# Patient Record
Sex: Male | Born: 1940
Health system: Southern US, Community
[De-identification: ages and names within clinical notes are randomized; demographics above are authoritative.]

## PROBLEM LIST (undated history)

## (undated) ENCOUNTER — Emergency Department (HOSPITAL_COMMUNITY): Admission: EM | Payer: Medicare HMO | Source: Home / Self Care

## (undated) DIAGNOSIS — N400 Enlarged prostate without lower urinary tract symptoms: Secondary | ICD-10-CM

## (undated) DIAGNOSIS — Z9189 Other specified personal risk factors, not elsewhere classified: Secondary | ICD-10-CM

## (undated) DIAGNOSIS — I10 Essential (primary) hypertension: Secondary | ICD-10-CM

## (undated) DIAGNOSIS — Z87442 Personal history of urinary calculi: Secondary | ICD-10-CM

## (undated) DIAGNOSIS — R7302 Impaired glucose tolerance (oral): Secondary | ICD-10-CM

## (undated) DIAGNOSIS — E785 Hyperlipidemia, unspecified: Secondary | ICD-10-CM

## (undated) DIAGNOSIS — J449 Chronic obstructive pulmonary disease, unspecified: Secondary | ICD-10-CM

## (undated) DIAGNOSIS — R300 Dysuria: Secondary | ICD-10-CM

## (undated) DIAGNOSIS — K5730 Diverticulosis of large intestine without perforation or abscess without bleeding: Secondary | ICD-10-CM

## (undated) DIAGNOSIS — N289 Disorder of kidney and ureter, unspecified: Secondary | ICD-10-CM

## (undated) DIAGNOSIS — N210 Calculus in bladder: Secondary | ICD-10-CM

## (undated) HISTORY — DX: Chronic obstructive pulmonary disease, unspecified: J44.9

## (undated) HISTORY — PX: APPENDECTOMY: SHX54

## (undated) HISTORY — PX: CATARACT EXTRACTION W/ INTRAOCULAR LENS  IMPLANT, BILATERAL: SHX1307

## (undated) HISTORY — DX: Dysuria: R30.0

---

## 1998-01-06 ENCOUNTER — Ambulatory Visit (HOSPITAL_COMMUNITY): Admission: RE | Admit: 1998-01-06 | Discharge: 1998-01-06 | Payer: Self-pay | Admitting: *Deleted

## 1998-01-06 ENCOUNTER — Encounter: Payer: Self-pay | Admitting: *Deleted

## 2003-10-05 HISTORY — PX: COLONOSCOPY: SHX174

## 2003-10-05 LAB — HM COLONOSCOPY

## 2003-10-09 ENCOUNTER — Ambulatory Visit (HOSPITAL_COMMUNITY): Admission: RE | Admit: 2003-10-09 | Discharge: 2003-10-09 | Payer: Self-pay | Admitting: General Surgery

## 2003-10-09 HISTORY — PX: LAPAROSCOPIC INGUINAL HERNIA REPAIR: SUR788

## 2003-11-12 ENCOUNTER — Ambulatory Visit: Payer: Self-pay | Admitting: Internal Medicine

## 2004-03-29 ENCOUNTER — Ambulatory Visit: Payer: Self-pay | Admitting: Internal Medicine

## 2004-04-21 ENCOUNTER — Ambulatory Visit: Payer: Self-pay | Admitting: Internal Medicine

## 2004-09-13 ENCOUNTER — Ambulatory Visit: Payer: Self-pay | Admitting: Internal Medicine

## 2004-10-28 ENCOUNTER — Ambulatory Visit: Payer: Self-pay | Admitting: Internal Medicine

## 2004-11-01 ENCOUNTER — Ambulatory Visit: Payer: Self-pay | Admitting: Internal Medicine

## 2004-12-09 ENCOUNTER — Ambulatory Visit: Payer: Self-pay | Admitting: Internal Medicine

## 2005-02-17 ENCOUNTER — Ambulatory Visit: Payer: Self-pay | Admitting: Internal Medicine

## 2005-09-21 ENCOUNTER — Encounter: Payer: Self-pay | Admitting: Internal Medicine

## 2005-09-21 LAB — CONVERTED CEMR LAB: PSA: 0.55 ng/mL

## 2005-11-03 ENCOUNTER — Ambulatory Visit: Payer: Self-pay | Admitting: Internal Medicine

## 2005-12-15 ENCOUNTER — Ambulatory Visit: Payer: Self-pay | Admitting: Internal Medicine

## 2006-01-09 ENCOUNTER — Ambulatory Visit: Payer: Self-pay | Admitting: Internal Medicine

## 2006-01-09 LAB — CONVERTED CEMR LAB
ALT: 17 units/L (ref 0–40)
AST: 17 units/L (ref 0–37)
Albumin: 4 g/dL (ref 3.5–5.2)
Alkaline Phosphatase: 51 units/L (ref 39–117)
BUN: 26 mg/dL — ABNORMAL HIGH (ref 6–23)
Basophils Absolute: 0.1 10*3/uL (ref 0.0–0.1)
Basophils Relative: 1 % (ref 0.0–1.0)
Bilirubin Urine: NEGATIVE
CO2: 29 meq/L (ref 19–32)
Calcium: 9.2 mg/dL (ref 8.4–10.5)
Chloride: 105 meq/L (ref 96–112)
Chol/HDL Ratio, serum: 4
Cholesterol: 175 mg/dL (ref 0–200)
Creatinine, Ser: 1.5 mg/dL (ref 0.4–1.5)
Eosinophil percent: 2.7 % (ref 0.0–5.0)
GFR calc non Af Amer: 50 mL/min
Glomerular Filtration Rate, Af Am: 61 mL/min/{1.73_m2}
Glucose, Bld: 101 mg/dL — ABNORMAL HIGH (ref 70–99)
HCT: 46.5 % (ref 39.0–52.0)
HDL: 43.7 mg/dL (ref >39.0)
Hemoglobin, Urine: NEGATIVE
Hemoglobin: 15.7 g/dL (ref 13.0–17.0)
Ketones, ur: NEGATIVE mg/dL
LDL Cholesterol: 114 mg/dL — ABNORMAL HIGH (ref 0–99)
Leukocytes, UA: NEGATIVE
Lymphocytes Relative: 20.2 % (ref 12.0–46.0)
MCHC: 33.8 g/dL (ref 30.0–36.0)
MCV: 92 fL (ref 78.0–100.0)
Monocytes Absolute: 0.7 10*3/uL (ref 0.2–0.7)
Monocytes Relative: 9.8 % (ref 3.0–11.0)
Neutro Abs: 4.9 10*3/uL (ref 1.4–7.7)
Neutrophils Relative %: 66.3 % (ref 43.0–77.0)
Nitrite: NEGATIVE
PSA: 0.66 ng/mL (ref 0.10–4.00)
Platelets: 228 10*3/uL (ref 150–400)
Potassium: 4.3 meq/L (ref 3.5–5.1)
RBC: 5.06 M/uL (ref 4.22–5.81)
RDW: 12.5 % (ref 11.5–14.6)
Sodium: 142 meq/L (ref 135–145)
Specific Gravity, Urine: 1.025 (ref 1.000–1.03)
TSH: 3.04 microintl units/mL (ref 0.35–5.50)
Total Bilirubin: 0.8 mg/dL (ref 0.3–1.2)
Total Protein, Urine: NEGATIVE mg/dL
Total Protein: 6.6 g/dL (ref 6.0–8.3)
Triglyceride fasting, serum: 85 mg/dL (ref 0–149)
Urine Glucose: NEGATIVE mg/dL
Urobilinogen, UA: 0.2 (ref 0.0–1.0)
VLDL: 17 mg/dL (ref 0–40)
WBC: 7.4 10*3/uL (ref 4.5–10.5)
pH: 6 (ref 5.0–8.0)

## 2006-08-01 DIAGNOSIS — J449 Chronic obstructive pulmonary disease, unspecified: Secondary | ICD-10-CM | POA: Insufficient documentation

## 2006-08-22 ENCOUNTER — Ambulatory Visit: Payer: Self-pay | Admitting: Internal Medicine

## 2006-08-22 LAB — CONVERTED CEMR LAB
ALT: 21 units/L (ref 0–53)
AST: 40 units/L — ABNORMAL HIGH (ref 0–37)
Albumin: 4.1 g/dL (ref 3.5–5.2)
Alkaline Phosphatase: 42 units/L (ref 39–117)
BUN: 26 mg/dL — ABNORMAL HIGH (ref 6–23)
Basophils Absolute: 0 10*3/uL (ref 0.0–0.1)
Basophils Relative: 0.9 % (ref 0.0–1.0)
Bilirubin Urine: NEGATIVE
Bilirubin, Direct: 0.1 mg/dL (ref 0.0–0.3)
CO2: 30 meq/L (ref 19–32)
Calcium: 9.3 mg/dL (ref 8.4–10.5)
Chloride: 104 meq/L (ref 96–112)
Cholesterol: 166 mg/dL (ref 0–200)
Creatinine, Ser: 1.6 mg/dL — ABNORMAL HIGH (ref 0.4–1.5)
Eosinophils Absolute: 0.3 10*3/uL (ref 0.0–0.6)
Eosinophils Relative: 5.3 % — ABNORMAL HIGH (ref 0.0–5.0)
GFR calc Af Amer: 56 mL/min
GFR calc non Af Amer: 46 mL/min
Glucose, Bld: 108 mg/dL — ABNORMAL HIGH (ref 70–99)
HCT: 45.2 % (ref 39.0–52.0)
HDL: 37 mg/dL — ABNORMAL LOW (ref >39.0)
Hemoglobin, Urine: NEGATIVE
Hemoglobin: 15.5 g/dL (ref 13.0–17.0)
Ketones, ur: NEGATIVE mg/dL
LDL Cholesterol: 114 mg/dL — ABNORMAL HIGH (ref 0–99)
Leukocytes, UA: NEGATIVE
Lymphocytes Relative: 22.9 % (ref 12.0–46.0)
MCHC: 34.3 g/dL (ref 30.0–36.0)
MCV: 88.8 fL (ref 78.0–100.0)
Monocytes Absolute: 0.6 10*3/uL (ref 0.2–0.7)
Monocytes Relative: 12 % — ABNORMAL HIGH (ref 3.0–11.0)
Neutro Abs: 3 10*3/uL (ref 1.4–7.7)
Neutrophils Relative %: 58.9 % (ref 43.0–77.0)
Nitrite: NEGATIVE
PSA: 0.55 ng/mL (ref 0.10–4.00)
Platelets: 215 10*3/uL (ref 150–400)
Potassium: 4.9 meq/L (ref 3.5–5.1)
RBC: 5.09 M/uL (ref 4.22–5.81)
RDW: 12.1 % (ref 11.5–14.6)
Sodium: 141 meq/L (ref 135–145)
Specific Gravity, Urine: 1.025 (ref 1.000–1.03)
TSH: 3.85 microintl units/mL (ref 0.35–5.50)
Total Bilirubin: 0.9 mg/dL (ref 0.3–1.2)
Total CHOL/HDL Ratio: 4.5
Total Protein, Urine: NEGATIVE mg/dL
Total Protein: 6.8 g/dL (ref 6.0–8.3)
Triglycerides: 76 mg/dL (ref 0–149)
Urine Glucose: NEGATIVE mg/dL
Urobilinogen, UA: 0.2 (ref 0.0–1.0)
VLDL: 15 mg/dL (ref 0–40)
WBC: 5 10*3/uL (ref 4.5–10.5)
pH: 6 (ref 5.0–8.0)

## 2006-09-19 ENCOUNTER — Ambulatory Visit: Payer: Self-pay

## 2006-09-23 ENCOUNTER — Encounter: Payer: Self-pay | Admitting: Internal Medicine

## 2006-09-23 DIAGNOSIS — I10 Essential (primary) hypertension: Secondary | ICD-10-CM | POA: Insufficient documentation

## 2006-09-23 DIAGNOSIS — Z87442 Personal history of urinary calculi: Secondary | ICD-10-CM | POA: Insufficient documentation

## 2006-09-23 DIAGNOSIS — E785 Hyperlipidemia, unspecified: Secondary | ICD-10-CM | POA: Insufficient documentation

## 2006-11-30 ENCOUNTER — Ambulatory Visit: Payer: Self-pay | Admitting: Internal Medicine

## 2007-01-15 ENCOUNTER — Telehealth: Payer: Self-pay | Admitting: Internal Medicine

## 2007-01-29 ENCOUNTER — Telehealth: Payer: Self-pay | Admitting: Internal Medicine

## 2007-02-15 ENCOUNTER — Ambulatory Visit: Payer: Self-pay | Admitting: Internal Medicine

## 2007-02-15 DIAGNOSIS — J441 Chronic obstructive pulmonary disease with (acute) exacerbation: Secondary | ICD-10-CM | POA: Insufficient documentation

## 2007-02-15 DIAGNOSIS — K5730 Diverticulosis of large intestine without perforation or abscess without bleeding: Secondary | ICD-10-CM | POA: Insufficient documentation

## 2007-02-26 ENCOUNTER — Telehealth (INDEPENDENT_AMBULATORY_CARE_PROVIDER_SITE_OTHER): Payer: Self-pay | Admitting: *Deleted

## 2007-06-06 ENCOUNTER — Telehealth: Payer: Self-pay | Admitting: Internal Medicine

## 2007-07-26 ENCOUNTER — Ambulatory Visit: Payer: Self-pay | Admitting: Internal Medicine

## 2007-07-26 DIAGNOSIS — R141 Gas pain: Secondary | ICD-10-CM | POA: Insufficient documentation

## 2007-07-26 DIAGNOSIS — R300 Dysuria: Secondary | ICD-10-CM | POA: Insufficient documentation

## 2007-07-26 DIAGNOSIS — R143 Flatulence: Secondary | ICD-10-CM

## 2007-07-26 DIAGNOSIS — R142 Eructation: Secondary | ICD-10-CM

## 2007-07-26 LAB — CONVERTED CEMR LAB
Bilirubin Urine: NEGATIVE
Glucose, Urine, Semiquant: NEGATIVE
Protein, U semiquant: NEGATIVE
Urobilinogen, UA: 0.2
pH: 5

## 2007-07-30 ENCOUNTER — Encounter: Payer: Self-pay | Admitting: Internal Medicine

## 2007-08-02 ENCOUNTER — Encounter: Admission: RE | Admit: 2007-08-02 | Discharge: 2007-08-02 | Payer: Self-pay | Admitting: Internal Medicine

## 2007-09-18 ENCOUNTER — Ambulatory Visit: Payer: Self-pay | Admitting: Internal Medicine

## 2007-09-19 LAB — CONVERTED CEMR LAB
Albumin: 4.1 g/dL (ref 3.5–5.2)
BUN: 27 mg/dL — ABNORMAL HIGH (ref 6–23)
Basophils Absolute: 0 10*3/uL (ref 0.0–0.1)
Bilirubin Urine: NEGATIVE
Cholesterol: 153 mg/dL (ref 0–200)
Creatinine, Ser: 1.8 mg/dL — ABNORMAL HIGH (ref 0.4–1.5)
Crystals: NEGATIVE
Eosinophils Absolute: 0.3 10*3/uL (ref 0.0–0.7)
Eosinophils Relative: 6 % — ABNORMAL HIGH (ref 0.0–5.0)
GFR calc Af Amer: 49 mL/min
GFR calc non Af Amer: 40 mL/min
HCT: 44.8 % (ref 39.0–52.0)
HDL: 40.4 mg/dL (ref >39.0)
Hemoglobin, Urine: NEGATIVE
Ketones, ur: NEGATIVE mg/dL
LDL Cholesterol: 96 mg/dL (ref 0–99)
MCHC: 34.6 g/dL (ref 30.0–36.0)
MCV: 89.7 fL (ref 78.0–100.0)
Monocytes Absolute: 0.6 10*3/uL (ref 0.1–1.0)
PSA: 10.73 ng/mL — ABNORMAL HIGH (ref 0.10–4.00)
Platelets: 198 10*3/uL (ref 150–400)
Potassium: 4 meq/L (ref 3.5–5.1)
RBC / HPF: NONE SEEN
RDW: 11.9 % (ref 11.5–14.6)
Total Protein, Urine: NEGATIVE mg/dL
Triglycerides: 83 mg/dL (ref 0–149)
Urine Glucose: NEGATIVE mg/dL
VLDL: 17 mg/dL (ref 0–40)
WBC: 5.8 10*3/uL (ref 4.5–10.5)

## 2007-10-03 ENCOUNTER — Ambulatory Visit: Payer: Self-pay | Admitting: Internal Medicine

## 2007-10-03 DIAGNOSIS — L989 Disorder of the skin and subcutaneous tissue, unspecified: Secondary | ICD-10-CM | POA: Insufficient documentation

## 2007-10-03 DIAGNOSIS — E739 Lactose intolerance, unspecified: Secondary | ICD-10-CM | POA: Insufficient documentation

## 2007-10-03 DIAGNOSIS — N183 Chronic kidney disease, stage 3 unspecified: Secondary | ICD-10-CM | POA: Insufficient documentation

## 2007-10-03 DIAGNOSIS — R972 Elevated prostate specific antigen [PSA]: Secondary | ICD-10-CM | POA: Insufficient documentation

## 2007-10-30 ENCOUNTER — Encounter: Payer: Self-pay | Admitting: Internal Medicine

## 2007-10-31 ENCOUNTER — Encounter: Payer: Self-pay | Admitting: Internal Medicine

## 2007-10-31 LAB — CONVERTED CEMR LAB: PSA: NORMAL ng/mL

## 2007-11-05 ENCOUNTER — Encounter: Payer: Self-pay | Admitting: Internal Medicine

## 2007-11-12 ENCOUNTER — Telehealth: Payer: Self-pay | Admitting: Internal Medicine

## 2008-04-07 ENCOUNTER — Ambulatory Visit: Payer: Self-pay | Admitting: Internal Medicine

## 2008-04-07 DIAGNOSIS — F528 Other sexual dysfunction not due to a substance or known physiological condition: Secondary | ICD-10-CM | POA: Insufficient documentation

## 2008-04-07 LAB — CONVERTED CEMR LAB
Calcium: 9.4 mg/dL (ref 8.4–10.5)
Cholesterol: 162 mg/dL (ref 0–200)
GFR calc non Af Amer: 49.59 mL/min (ref >60)
Hgb A1c MFr Bld: 5.7 % (ref 4.6–6.5)
Sodium: 138 meq/L (ref 135–145)
Triglycerides: 77 mg/dL (ref 0.0–149.0)

## 2008-06-02 ENCOUNTER — Telehealth: Payer: Self-pay | Admitting: Internal Medicine

## 2008-12-11 ENCOUNTER — Ambulatory Visit: Payer: Self-pay | Admitting: Internal Medicine

## 2008-12-11 LAB — CONVERTED CEMR LAB
BUN: 25 mg/dL — ABNORMAL HIGH (ref 6–23)
Basophils Relative: 0 % (ref 0.0–3.0)
Bilirubin, Direct: 0.3 mg/dL (ref 0.0–0.3)
CO2: 27 meq/L (ref 19–32)
Chloride: 104 meq/L (ref 96–112)
Cholesterol: 146 mg/dL (ref 0–200)
Creatinine, Ser: 1.9 mg/dL — ABNORMAL HIGH (ref 0.4–1.5)
Eosinophils Absolute: 0.4 10*3/uL (ref 0.0–0.7)
HDL: 40.3 mg/dL (ref >39.00)
MCHC: 33.9 g/dL (ref 30.0–36.0)
MCV: 91.8 fL (ref 78.0–100.0)
Monocytes Absolute: 1.2 10*3/uL — ABNORMAL HIGH (ref 0.1–1.0)
Neutrophils Relative %: 73.3 % (ref 43.0–77.0)
Platelets: 146 10*3/uL — ABNORMAL LOW (ref 150.0–400.0)
RBC: 4.82 M/uL (ref 4.22–5.81)
Specific Gravity, Urine: 1.02 (ref 1.000–1.030)
Total CHOL/HDL Ratio: 4
Total Protein, Urine: NEGATIVE mg/dL
Total Protein: 7 g/dL (ref 6.0–8.3)
Triglycerides: 82 mg/dL (ref 0.0–149.0)
Urine Glucose: NEGATIVE mg/dL
Urobilinogen, UA: 0.2 (ref 0.0–1.0)
pH: 5.5 (ref 5.0–8.0)

## 2008-12-18 ENCOUNTER — Encounter: Payer: Self-pay | Admitting: Internal Medicine

## 2008-12-23 ENCOUNTER — Encounter (INDEPENDENT_AMBULATORY_CARE_PROVIDER_SITE_OTHER): Payer: Self-pay | Admitting: *Deleted

## 2009-01-08 ENCOUNTER — Ambulatory Visit: Payer: Self-pay | Admitting: Internal Medicine

## 2009-01-08 DIAGNOSIS — N400 Enlarged prostate without lower urinary tract symptoms: Secondary | ICD-10-CM | POA: Insufficient documentation

## 2009-01-08 DIAGNOSIS — N390 Urinary tract infection, site not specified: Secondary | ICD-10-CM | POA: Insufficient documentation

## 2009-01-08 LAB — CONVERTED CEMR LAB
Nitrite: POSITIVE
Specific Gravity, Urine: 1.02 (ref 1.000–1.030)
Total Protein, Urine: 30 mg/dL
pH: 5.5 (ref 5.0–8.0)

## 2009-01-09 ENCOUNTER — Encounter: Payer: Self-pay | Admitting: Internal Medicine

## 2009-02-23 ENCOUNTER — Telehealth: Payer: Self-pay | Admitting: Internal Medicine

## 2009-04-01 ENCOUNTER — Telehealth: Payer: Self-pay | Admitting: Internal Medicine

## 2009-10-13 ENCOUNTER — Ambulatory Visit: Payer: Self-pay | Admitting: Internal Medicine

## 2010-02-01 NOTE — Progress Notes (Signed)
Summary: Rx increase/mail order  Phone Note Call from Patient Call back at Home Phone [phone]   Caller: Patient Summary of Call: pt called requesting Rx fot Flomax be increased because per pt, it is not working as well as before. pt is requesting the increased Rx to go to [facility] order pharmacy. Initial call taken by: Margaret Pyle, CMA,  April 01, 2009 4:34 PM  Follow-up for Phone Call        flomax just does not work much better at all with taking 2 pills ;  if having problem more than the current flomax is helping, he should see urology - is this OK to refer? Follow-up by: Corwin Levins MD,  April 01, 2009 4:51 PM  Additional Follow-up for Phone Call Additional follow up Details #1::        Pt informed, he has seen [facility] in the past, says last office visit was 3 mths ago. He says they have not given him anything that has helped. Please adivse.  Additional Follow-up by: Lamar Sprinkles, CMA,  April 01, 2009 5:46 PM    Additional Follow-up for Phone Call Additional follow up Details #2::    the next step is often surgury depending on the exact nature of the problem;  unfortunately I would have nothing further to offer at this point;  please ask pt to call urology to make appt, or we can refer him if he feels this is needed Follow-up by: Corwin Levins MD,  April 01, 2009 6:46 PM  Additional Follow-up for Phone Call Additional follow up Details #3:: Details for Additional Follow-up Action Taken: pt informed and will sch appt with Urology Additional Follow-up by: Margaret Pyle, CMA,  April 02, 2009 9:41 AM

## 2010-02-01 NOTE — Progress Notes (Signed)
Summary: rx refill  Phone Note Call from Patient Call back at Home Phone [phone]   Caller: Patient Summary of Call: pt called requesting refills of Lovastatin 40mg  to [facility]. Initial call taken by: Margaret Pyle, CMA,  February 23, 2009 3:55 PM    Prescriptions: LOVASTATIN 40 MG TABS (LOVASTATIN) Take 1 tablet by mouth once a day  #90 x 3   Entered by:   Margaret Pyle, CMA   Authorized by:   Corwin Levins MD   Signed by:   Margaret Pyle, CMA on 02/23/2009   Method used:   Faxed to ...       [facility]* (mail-order)             ,          Ph: [phone]       Fax: [phone]   RxID:   [id number] LOVASTATIN 40 MG TABS (LOVASTATIN) Take 1 tablet by mouth once a day  #90 x 3   Entered by:   Margaret Pyle, CMA   Authorized by:   Corwin Levins MD   Signed by:   Margaret Pyle, CMA on 02/23/2009   Method used:   Electronically to        [facility]* (mail-order)             ,          Ph: [phone]       Fax: [phone]   RxID:   [id number]

## 2010-02-01 NOTE — Assessment & Plan Note (Signed)
Summary: cough  stc   Vital Signs:  Patient profile:   70 year old male Height:      67 inches Weight:      170.50 pounds BMI:     26.80 O2 Sat:      96 % on Room air Temp:     98.9 degrees F oral Pulse rate:   75 / minute BP sitting:   122 / 78  (left arm) Cuff size:   regular  Vitals Entered By: Zella Ball Ewing CMA Duncan Dull) (October 13, 2009 2:34 PM)  O2 Flow:  Room air CC: Cough, congestion, flu shot/RE   CC:  Cough, congestion, and flu shot/RE.  History of Present Illness: here with 2-3 days acute onset fever, mild ST and incresaing prod cough , greenish sputum, with mild wheezing worse today as well, o/w Pt denies CP, worsening sob,  orthopnea, pnd, worsening LE edema, palps, dizziness or syncope .  Has some general weakness and malaise, but denies polydipsia or polydipsia.  Pt denies new neuro symptoms such as headache, facial or extremity weakness  Overall good compliance with meds, good tolerability.   Problems Prior to Update: 1)  Benign Prostatic Hypertrophy  (ICD-600.00) 2)  Uti  (ICD-599.0) 3)  Erectile Dysfunction  (ICD-302.72) 4)  Renal Insufficiency  (ICD-588.9) 5)  Psa, Increased  (ICD-790.93) 6)  Skin Lesion  (ICD-709.9) 7)  Glucose Intolerance  (ICD-271.3) 8)  Preventive Health Care  (ICD-V70.0) 9)  Abdominal Distension  (ICD-787.3) 10)  Dysuria, Chronic  (ICD-788.1) 11)  Chronic Obstructive Pulmonary Disease, Acute Exacerbation  (ICD-491.21) 12)  Diverticulosis, Colon  (ICD-562.10) 13)  Hypertension  (ICD-401.9) 14)  Hyperlipidemia  (ICD-272.4) 15)  Renal Calculus, Hx of  (ICD-V13.01) 16)  COPD  (ICD-496)  Medications Prior to Update: 1)  Lovastatin 40 Mg Tabs (Lovastatin) .... Take 1 Tablet By Mouth Once A Day 2)  Coreg 12.5 Mg  Tabs (Carvedilol) .Marland Kitchen.. 1 By Mouth Two Times A Day 3)  Ecotrin Low Strength 81 Mg  Tbec (Aspirin) .Marland Kitchen.. 1po Qd 4)  Hydrochlorothiazide 25 Mg  Tabs (Hydrochlorothiazide) .... Take 1/2  Tab By Mouth Every Morning 5)  Flomax 0.4 Mg  Caps (Tamsulosin Hcl) .Marland Kitchen.. 1 By Mouth Once Daily  - Generic 6)  Omeprazole 20 Mg Tbec (Omeprazole) .... 2 By Mouth Once Daily 7)  Combivent 103-18 Mcg/act Aero (Ipratropium-Albuterol) .... 2 Puffs Four Times Per Day As Needed 8)  Viagra 100 Mg Tabs (Sildenafil Citrate) .Marland Kitchen.. 1po Once Daily As Needed 9)  Azithromycin 250 Mg Tabs (Azithromycin) .... 2po Qd For 1 Day, Then 1po Qd For 4days, Then Stop 10)  Prednisone 10 Mg Tabs (Prednisone) .... 4po Qd For 3days, Then 3po Qd For 3days, Then 2po Qd For 3days, Then 1po Qd For 3 Days, Then Stop 11)  Hydrocodone-Homatropine 5-1.5 Mg/52ml Syrp (Hydrocodone-Homatropine) .Marland Kitchen.. 1 Tsp By Mouth Q 6 Hrs As Needed Cough 12)  Ciprofloxacin Hcl 500 Mg Tabs (Ciprofloxacin Hcl) .Marland Kitchen.. 1 By Mouth Two Times A Day  Current Medications (verified): 1)  Lovastatin 40 Mg Tabs (Lovastatin) .... Take 1 Tablet By Mouth Once A Day 2)  Coreg 12.5 Mg  Tabs (Carvedilol) .Marland Kitchen.. 1 By Mouth Two Times A Day 3)  Ecotrin Low Strength 81 Mg  Tbec (Aspirin) .Marland Kitchen.. 1po Qd 4)  Hydrochlorothiazide 25 Mg  Tabs (Hydrochlorothiazide) .... Take 1/2  Tab By Mouth Every Morning 5)  Flomax 0.4 Mg Caps (Tamsulosin Hcl) .Marland Kitchen.. 1 By Mouth Once Daily  - Generic 6)  Omeprazole 20 Mg  Tbec (Omeprazole) .... 2 By Mouth Once Daily 7)  Combivent 103-18 Mcg/act Aero (Ipratropium-Albuterol) .... 2 Puffs Four Times Per Day As Needed 8)  Viagra 100 Mg Tabs (Sildenafil Citrate) .Marland Kitchen.. 1po Once Daily As Needed 9)  Amoxicillin-Pot Clavulanate 875-125 Mg Tabs (Amoxicillin-Pot Clavulanate) .Marland Kitchen.. 1po Two Times A Day 10)  Prednisone 10 Mg Tabs (Prednisone) .... 4po Qd For 3days, Then 3po Qd For 3days, Then 2po Qd For 3days, Then 1po Qd For 3 Days, Then Stop 11)  Hydrocodone-Homatropine 5-1.5 Mg/98ml Syrp (Hydrocodone-Homatropine) .Marland Kitchen.. 1 Tsp By Mouth Q 6 Hrs As Needed Cough  Allergies (verified): No Known Drug Allergies  Past History:  Past Medical History: Last updated: 01/08/2009 COPD Hx of Renal  Stones Hyperlipidemia Hypertension Diverticulosis, colon glucose intolerance Renal insufficiency Benign prostatic hypertrophy/urinary retention  Past Surgical History: Last updated: 09/23/2006 Appendectomy Cataract extraction Inguinal herniorrhaphy- 2005  Social History: Last updated: 12/11/2008 Former Smoker - auit approx 2006 Alcohol use-no Married 3 children retired - [employer]  Risk Factors: Smoking Status: quit (02/15/2007)  Review of Systems       all otherwise negative per pt -    Physical Exam  General:  alert and well-developed.  , mild ill  Head:  normocephalic and atraumatic.   Eyes:  vision grossly intact, pupils equal, and pupils round.   Ears:  R ear normal and L ear normal.   Mouth:  pharyngeal erythema and fair dentition.   Neck:  supple and no masses.   Lungs:  normal respiratory effort, R decreased breath sounds, R wheezes, L decreased breath sounds, and L wheezes.   Heart:  normal rate and regular rhythm.   Extremities:  no edema, no erythema    Impression & Recommendations:  Problem # 1:  BRONCHITIS-ACUTE (ICD-466.0)  The following medications were removed from the medication list:    Ciprofloxacin Hcl 500 Mg Tabs (Ciprofloxacin hcl) .Marland Kitchen... 1 by mouth two times a day His updated medication list for this problem includes:    Combivent 103-18 Mcg/act Aero (Ipratropium-albuterol) .Marland Kitchen... 2 puffs four times per day as needed    Amoxicillin-pot Clavulanate 875-125 Mg Tabs (Amoxicillin-pot clavulanate) .Marland Kitchen... 1po two times a day    Hydrocodone-homatropine 5-1.5 Mg/71ml Syrp (Hydrocodone-homatropine) .Marland Kitchen... 1 tsp by mouth q 6 hrs as needed cough treat as above, f/u any worsening signs or symptoms   Problem # 2:  CHRONIC OBSTRUCTIVE PULMONARY DISEASE, ACUTE EXACERBATION (ICD-491.21) mild , tx with depomedrol IM today, predpack asd, cont inhalers,  to also check cxr - r/o pna Orders: Depo- Medrol 40mg  (J1030) Depo- Medrol 80mg   (J1040) Admin of Therapeutic Inj  intramuscular or subcutaneous (91478) T-2 View CXR, Same Day (71020.5TC)  Problem # 3:  GLUCOSE INTOLERANCE (ICD-271.3) asympt - Continue all previous medications as before this visit  - no meds needed at this time, but pt to call for onset polys or CBG> 200 with steroid tx above  Problem # 4:  HYPERTENSION (ICD-401.9)  His updated medication list for this problem includes:    Coreg 12.5 Mg Tabs (Carvedilol) .Marland Kitchen... 1 by mouth two times a day    Hydrochlorothiazide 25 Mg Tabs (Hydrochlorothiazide) .Marland Kitchen... Take 1/2  tab by mouth every morning  BP today: 122/78 Prior BP: 100/70 (01/08/2009)  Labs Reviewed: K+: 4.4 (12/11/2008) Creat: : 1.9 (12/11/2008)   Chol: 146 (12/11/2008)   HDL: 40.30 (12/11/2008)   LDL: 89 (12/11/2008)   TG: 82.0 (12/11/2008) stable overall by hx and exam, ok to continue meds/tx as is  Complete Medication List: 1)  Lovastatin 40 Mg Tabs (Lovastatin) .... Take 1 tablet by mouth once a day 2)  Coreg 12.5 Mg Tabs (Carvedilol) .Marland Kitchen.. 1 by mouth two times a day 3)  Ecotrin Low Strength 81 Mg Tbec (Aspirin) .Marland Kitchen.. 1po qd 4)  Hydrochlorothiazide 25 Mg Tabs (Hydrochlorothiazide) .... Take 1/2  tab by mouth every morning 5)  Flomax 0.4 Mg Caps (Tamsulosin hcl) .Marland Kitchen.. 1 by mouth once daily  - generic 6)  Omeprazole 20 Mg Tbec (Omeprazole) .... 2 by mouth once daily 7)  Combivent 103-18 Mcg/act Aero (Ipratropium-albuterol) .... 2 puffs four times per day as needed 8)  Viagra 100 Mg Tabs (Sildenafil citrate) .Marland Kitchen.. 1po once daily as needed 9)  Amoxicillin-pot Clavulanate 875-125 Mg Tabs (Amoxicillin-pot clavulanate) .Marland Kitchen.. 1po two times a day 10)  Prednisone 10 Mg Tabs (Prednisone) .... 4po qd for 3days, then 3po qd for 3days, then 2po qd for 3days, then 1po qd for 3 days, then stop 11)  Hydrocodone-homatropine 5-1.5 Mg/53ml Syrp (Hydrocodone-homatropine) .Marland Kitchen.. 1 tsp by mouth q 6 hrs as needed cough  Other Orders: Flu Vaccine 13yrs + MEDICARE PATIENTS  (W1191) Administration Flu vaccine - MCR (Y7829)  Patient Instructions: 1)  You had the steroid shot today 2)  the coreg was sent to [facility] 3)  Please take all new medications as prescribed - the antibiotic, and the prednisone (both sent to [facility]) 4)  Please take all new medications as prescribed - the cough medicine in the written signed prescription 5)  You had the flu shot today 6)  Continue all previous medications as before this visit 7)  Please schedule a follow-up appointment in 2 months with CPX labs Prescriptions: HYDROCODONE-HOMATROPINE 5-1.5 MG/5ML SYRP (HYDROCODONE-HOMATROPINE) 1 tsp by mouth q 6 hrs as needed cough  #6 oz x 1   Entered and Authorized by:   Corwin Levins MD   Signed by:   Corwin Levins MD on 10/13/2009   Method used:   Print then Give to Patient   RxID:   [id number] AMOXICILLIN-POT CLAVULANATE 875-125 MG TABS (AMOXICILLIN-POT CLAVULANATE) 1po two times a day  #20 x 0   Entered and Authorized by:   Corwin Levins MD   Signed by:   Corwin Levins MD on 10/13/2009   Method used:   Electronically to        [facility] [address] (retail)       [phone]       [place], [place]  [address]       Ph: [phone]       Fax: [phone]   RxID:   [id number] PREDNISONE 10 MG TABS (PREDNISONE) 4po qd for 3days, then 3po qd for 3days, then 2po qd for 3days, then 1po qd for 3 days, then stop  #30 x 0   Entered and Authorized by:   Corwin Levins MD   Signed by:   Corwin Levins MD on 10/13/2009   Method used:   Electronically to        [facility] [address] (retail)       [phone]       [place], [place]  [address]       Ph: [phone]       Fax: [phone]   RxID:   [id number] PREDNISONE 10 MG TABS (PREDNISONE) 4po qd for 3days, then 3po qd for 3days,  then 2po qd for 3days, then 1po qd for 3 days, then stop  #30 x 0   Entered and Authorized by:   Corwin Levins MD   Signed by:    Corwin Levins MD on 10/13/2009   Method used:   Print then Give to Patient   RxID:   [id number] COREG 12.5 MG  TABS (CARVEDILOL) 1 by mouth two times a day  #180 x 3   Entered and Authorized by:   Corwin Levins MD   Signed by:   Corwin Levins MD on 10/13/2009   Method used:   Faxed to ...       [facility] (mail-order)             , [place]         Ph: [phone]       Fax: [phone]   RxID:   [id number]     Flu Vaccine Consent Questions     Do you have a history of severe allergic reactions to this vaccine? no    Any prior history of allergic reactions to egg and/or gelatin? no    Do you have a sensitivity to the preservative Thimersol? no    Do you have a past history of Guillan-Barre Syndrome? no    Do you currently have an acute febrile illness? no    Have you ever had a severe reaction to latex? no    Vaccine information given and explained to patient? yes    Are you currently pregnant? no    Lot Number:[id number]   Exp Date:07/02/2010   Site Given  Left Deltoid IMu1   Medication Administration  Injection # 1:    Medication: Depo- Medrol 40mg     Diagnosis: CHRONIC OBSTRUCTIVE PULMONARY DISEASE, ACUTE EXACERBATION (ICD-491.21)    Route: IM    Site: LUOQ gluteus    Exp Date: 04/2012    Lot #: [id number]    Mfr: [employer]    Comments: Patient received 120mg  Depo-Medrol    Patient tolerated injection without complications    Given by: Zella Ball Ewing CMA Duncan Dull) (October 13, 2009 3:18 PM)  Injection # 2:    Medication: Depo- Medrol 80mg     Diagnosis: CHRONIC OBSTRUCTIVE PULMONARY DISEASE, ACUTE EXACERBATION (ICD-491.21)    Route: IM    Site: LUOQ gluteus    Exp Date: 04/2012    Lot #: [id number]    Mfr: [employer]    Given by: Zella Ball Ewing CMA Duncan Dull) (October 13, 2009 3:18 PM)  Orders Added: 1)  Flu Vaccine 72yrs + MEDICARE PATIENTS [Q2039] 2)  Administration Flu vaccine - MCR [G0008] 3)  Depo- Medrol 40mg  [J1030] 4)  Depo- Medrol 80mg  [J1040] 5)  Admin of Therapeutic Inj   intramuscular or subcutaneous [96372] 6)  T-2 View CXR, Same Day [71020.5TC] 7)  Est. Patient Level IV [72536]

## 2010-02-01 NOTE — Assessment & Plan Note (Signed)
Summary: fever/cold/chills/side door/cd   Vital Signs:  Patient profile:   70 year old male Height:      67 inches Weight:      169 pounds BMI:     26.56 O2 Sat:      92 % on Room air Temp:     98.1 degrees F oral Pulse rate:   92 / minute BP sitting:   100 / 70  (left arm) Cuff size:   regular  Vitals Entered ByZella Ball Ewing (January 08, 2009 2:05 PM)  O2 Flow:  Room air  CC: LBP,fever,chills,painful urination/RE   CC:  LBP, fever, chills, and painful urination/RE.  History of Present Illness: here with 3 days onset gradaully worsening fever, lower back discomfort and lower abd pain with dysuria and freq, mild urgency, without hematuria, n/v, chills.  Pt denies CP, sob, doe, wheezing, orthopnea, pnd, worsening LE edema, palps, dizziness or syncope   Pt denies new neuro symptoms such as headache, facial or extremity weakness .  Mentions terasozin at current dosing not working well  and has ongoing urinary retention at least mild per pt.    Problems Prior to Update: 1)  Benign Prostatic Hypertrophy  (ICD-600.00) 2)  Uti  (ICD-599.0) 3)  Erectile Dysfunction  (ICD-302.72) 4)  Renal Insufficiency  (ICD-588.9) 5)  Psa, Increased  (ICD-790.93) 6)  Skin Lesion  (ICD-709.9) 7)  Glucose Intolerance  (ICD-271.3) 8)  Preventive Health Care  (ICD-V70.0) 9)  Abdominal Distension  (ICD-787.3) 10)  Dysuria, Chronic  (ICD-788.1) 11)  Chronic Obstructive Pulmonary Disease, Acute Exacerbation  (ICD-491.21) 12)  Diverticulosis, Colon  (ICD-562.10) 13)  Hypertension  (ICD-401.9) 14)  Hyperlipidemia  (ICD-272.4) 15)  Renal Calculus, Hx of  (ICD-V13.01) 16)  COPD  (ICD-496)  Medications Prior to Update: 1)  Lovastatin 40 Mg Tabs (Lovastatin) .... Take 1 Tablet By Mouth Once A Day 2)  Coreg 12.5 Mg  Tabs (Carvedilol) .Marland Kitchen.. 1 By Mouth Two Times A Day 3)  Ecotrin Low Strength 81 Mg  Tbec (Aspirin) .Marland Kitchen.. 1po Qd 4)  Hydrochlorothiazide 25 Mg  Tabs (Hydrochlorothiazide) .... Take 1/2  Tab By  Mouth Every Morning 5)  Terazosin Hcl 5 Mg Caps (Terazosin Hcl) .Marland Kitchen.. 1 By Mouth Daily 6)  Omeprazole 20 Mg Tbec (Omeprazole) .... 2 By Mouth Once Daily 7)  Combivent 103-18 Mcg/act Aero (Ipratropium-Albuterol) .... 2 Puffs Four Times Per Day As Needed 8)  Viagra 100 Mg Tabs (Sildenafil Citrate) .Marland Kitchen.. 1po Once Daily As Needed 9)  Azithromycin 250 Mg Tabs (Azithromycin) .... 2po Qd For 1 Day, Then 1po Qd For 4days, Then Stop 10)  Prednisone 10 Mg Tabs (Prednisone) .... 4po Qd For 3days, Then 3po Qd For 3days, Then 2po Qd For 3days, Then 1po Qd For 3 Days, Then Stop 11)  Hydrocodone-Homatropine 5-1.5 Mg/44ml Syrp (Hydrocodone-Homatropine) .Marland Kitchen.. 1 Tsp By Mouth Q 6 Hrs As Needed Cough  Current Medications (verified): 1)  Lovastatin 40 Mg Tabs (Lovastatin) .... Take 1 Tablet By Mouth Once A Day 2)  Coreg 12.5 Mg  Tabs (Carvedilol) .Marland Kitchen.. 1 By Mouth Two Times A Day 3)  Ecotrin Low Strength 81 Mg  Tbec (Aspirin) .Marland Kitchen.. 1po Qd 4)  Hydrochlorothiazide 25 Mg  Tabs (Hydrochlorothiazide) .... Take 1/2  Tab By Mouth Every Morning 5)  Flomax 0.4 Mg Caps (Tamsulosin Hcl) .Marland Kitchen.. 1 By Mouth Once Daily  - Generic 6)  Omeprazole 20 Mg Tbec (Omeprazole) .... 2 By Mouth Once Daily 7)  Combivent 103-18 Mcg/act Aero (Ipratropium-Albuterol) .... 2 Puffs  Four Times Per Day As Needed 8)  Viagra 100 Mg Tabs (Sildenafil Citrate) .Marland Kitchen.. 1po Once Daily As Needed 9)  Azithromycin 250 Mg Tabs (Azithromycin) .... 2po Qd For 1 Day, Then 1po Qd For 4days, Then Stop 10)  Prednisone 10 Mg Tabs (Prednisone) .... 4po Qd For 3days, Then 3po Qd For 3days, Then 2po Qd For 3days, Then 1po Qd For 3 Days, Then Stop 11)  Hydrocodone-Homatropine 5-1.5 Mg/60ml Syrp (Hydrocodone-Homatropine) .Marland Kitchen.. 1 Tsp By Mouth Q 6 Hrs As Needed Cough 12)  Ciprofloxacin Hcl 500 Mg Tabs (Ciprofloxacin Hcl) .Marland Kitchen.. 1 By Mouth Two Times A Day  Allergies (verified): No Known Drug Allergies  Past History:  Past Surgical History: Last updated:  09/23/2006 Appendectomy Cataract extraction Inguinal herniorrhaphy- 2005  Social History: Last updated: 12/11/2008 Former Smoker - auit approx 2006 Alcohol use-no Married 3 children retired - [employer]  Risk Factors: Smoking Status: quit (02/15/2007)  Past Medical History: COPD Hx of Renal Stones Hyperlipidemia Hypertension Diverticulosis, colon glucose intolerance Renal insufficiency Benign prostatic hypertrophy/urinary retention  Review of Systems       all otherwise negative per pt -  Physical Exam  General:  alert and well-developed.  , mild ill  Head:  normocephalic and atraumatic.   Eyes:  vision grossly intact, pupils equal, and pupils round.   Ears:  R ear normal and L ear normal.   Nose:  no external deformity and no nasal discharge.   Mouth:  no gingival abnormalities and pharynx pink and moist.   Neck:  supple and no masses.   Lungs:  normal respiratory effort and normal breath sounds.   Heart:  normal rate and regular rhythm.   Abdomen:  soft, non-tender, and normal bowel sounds.  except for mild lower abd tender Msk:  no flank pain bilat, spine nontender throughout, no lumbar paravertebral tender or spasm Extremities:  no edema, no erythema  Neurologic:  alert & oriented X3 and strength normal in all extremities.     Impression & Recommendations:  Problem # 1:  UTI (ICD-599.0)  His updated medication list for this problem includes:    Azithromycin 250 Mg Tabs (Azithromycin) .Marland Kitchen... 2po qd for 1 day, then 1po qd for 4days, then stop    Ciprofloxacin Hcl 500 Mg Tabs (Ciprofloxacin hcl) .Marland Kitchen... 1 by mouth two times a day  Orders: T-Culture, Urine ([id number]) TLB-Udip w/ Micro (81001-URINE) pro UTI - treat as above, f/u any worsening signs or symptoms ,  to check urine studies  Problem # 2:  BENIGN PROSTATIC HYPERTROPHY (ICD-600.00)  His updated medication list for this problem includes:    Flomax 0.4 Mg Caps (Tamsulosin hcl)  .Marland Kitchen... 1 by mouth once daily  - generic treat as above, f/u any worsening signs or symptoms , stop the terazosin  Problem # 3:  HYPERTENSION (ICD-401.9)  His updated medication list for this problem includes:    Coreg 12.5 Mg Tabs (Carvedilol) .Marland Kitchen... 1 by mouth two times a day    Hydrochlorothiazide 25 Mg Tabs (Hydrochlorothiazide) .Marland Kitchen... Take 1/2  tab by mouth every morning  BP today: 100/70 Prior BP: 100/68 (12/11/2008)  Labs Reviewed: K+: 4.4 (12/11/2008) Creat: : 1.9 (12/11/2008)   Chol: 146 (12/11/2008)   HDL: 40.30 (12/11/2008)   LDL: 89 (12/11/2008)   TG: 82.0 (12/11/2008) stable overall by hx and exam, ok to continue meds/tx as is   Complete Medication List: 1)  Lovastatin 40 Mg Tabs (Lovastatin) .... Take 1 tablet by mouth once a day  2)  Coreg 12.5 Mg Tabs (Carvedilol) .Marland Kitchen.. 1 by mouth two times a day 3)  Ecotrin Low Strength 81 Mg Tbec (Aspirin) .Marland Kitchen.. 1po qd 4)  Hydrochlorothiazide 25 Mg Tabs (Hydrochlorothiazide) .... Take 1/2  tab by mouth every morning 5)  Flomax 0.4 Mg Caps (Tamsulosin hcl) .Marland Kitchen.. 1 by mouth once daily  - generic 6)  Omeprazole 20 Mg Tbec (Omeprazole) .... 2 by mouth once daily 7)  Combivent 103-18 Mcg/act Aero (Ipratropium-albuterol) .... 2 puffs four times per day as needed 8)  Viagra 100 Mg Tabs (Sildenafil citrate) .Marland Kitchen.. 1po once daily as needed 9)  Azithromycin 250 Mg Tabs (Azithromycin) .... 2po qd for 1 day, then 1po qd for 4days, then stop 10)  Prednisone 10 Mg Tabs (Prednisone) .... 4po qd for 3days, then 3po qd for 3days, then 2po qd for 3days, then 1po qd for 3 days, then stop 11)  Hydrocodone-homatropine 5-1.5 Mg/40ml Syrp (Hydrocodone-homatropine) .Marland Kitchen.. 1 tsp by mouth q 6 hrs as needed cough 12)  Ciprofloxacin Hcl 500 Mg Tabs (Ciprofloxacin hcl) .Marland Kitchen.. 1 by mouth two times a day  Patient Instructions: 1)  stop the terazosin 2)  start the cipro antibiotic 3)  start the generic flomax for the prostate retention 4)  Please go to the Lab in the basement  for your urine tests today  5)  Continue all previous medications as before this visit  6)  Please schedule a follow-up appointment as needed. Prescriptions: CIPROFLOXACIN HCL 500 MG TABS (CIPROFLOXACIN HCL) 1 by mouth two times a day  #20 x 0   Entered and Authorized by:   Corwin Levins MD   Signed by:   Corwin Levins MD on 01/08/2009   Method used:   Electronically to        [facility] [address] (retail)       [phone]       [place], [place]  [address]       Ph: [phone]       Fax: [phone]   RxID:   [id number] FLOMAX 0.4 MG CAPS (TAMSULOSIN HCL) 1 by mouth once daily  - generic  #30 x 11   Entered and Authorized by:   Corwin Levins MD   Signed by:   Corwin Levins MD on 01/08/2009   Method used:   Electronically to        [facility] [address] (retail)       [phone]       [place], [place]  [address]       Ph: [phone]       Fax: [phone]   RxID:   [id number]

## 2010-04-03 ENCOUNTER — Other Ambulatory Visit: Payer: Self-pay | Admitting: Internal Medicine

## 2010-05-20 NOTE — Op Note (Signed)
NAME:  Justin Walters, Justin Walters NO.:  [id number]   MEDICAL RECORD NO.:  [id number]          PATIENT TYPE:  AMB   LOCATION:  DAY                          FACILITY:  [facility]   PHYSICIAN:  Adolph Pollack, M.D.DATE OF BIRTH:  [birth year]   DATE OF PROCEDURE:  10/09/2003  DATE OF DISCHARGE:                                 OPERATIVE REPORT   PREOPERATIVE DIAGNOSIS:  Bilateral inguinal hernias.   POSTOPERATIVE DIAGNOSIS:  Bilateral inguinal hernias.   PROCEDURE:  Laparoscopic repair, bilateral inguinal hernias, with mesh.   SURGEON:  Adolph Pollack, M.D.   ANESTHESIA:  General.   INDICATIONS:  This is a 70 year old male who has noted a right inguinal  hernia for some time.  He was sent over  to me for that, and I discovered  him to have a smaller left inguinal hernia.  He now presents for  laparoscopic repair of the hernias with mesh.  The procedure and the risks  were discussed with him preoperatively.   TECHNIQUE:  He was seen in the holding area, and then he voided.  He was  then brought to the operating room and placed supine on the operating room  table.  General anesthetic was administered.  The hair on the lower  abdominal wall was clipped.  The lower abdominal wall and groin were  sterilely prepped and draped.  Dilute Marcaine solution was infiltrated in  the subumbilical region and a transverse subumbilical incision was made  through the skin and the subcutaneous tissue.  The right anterior rectus  sheath was identified, and a small incision made in it.  The underlying  right rectus muscle was swept laterally, exposing the posterior rectus  sheath.  A balloon dissection device was then placed into the peritoneal  space, and under direct vision, balloon dissection was performed.   The balloon dissection device was then deflated and removed.  A trocar was  then placed through the extra-peritoneal space, and CO2 gas was insufflated,  creating a working area.   Under direct vision, two 5 mm trocars were placed  through the lower midline.  Again, I began my dissection by identifying the  symphysis pubis and dissecting out Cooper's ligament bilaterally.  There was  a direct sac noted on the right side and reduced extra peritoneal fat from  this.  The inferior epigastric vessels had fallen posteriorly.  I elevated  these.  They still wanted to sag posteriorly.  I subsequently was able to  use blunt dissection to clear the fibrofatty tissue from the anterior and  lateral abdominal walls up to the level of the umbilicus.  The spermatic  cord was isolated, and no indirect hernia was noted.  Next, I approached the  left extra-peritoneal space.  The direct space was weakened.  I isolated the  spermatic cord, and an indirect hernia was noted.  Then the indirect sac was  stripped off the cord, back to the level of the umbilicus.  Using blunt  dissection, the fibrofatty tissue was dissected free from the anterolateral  abdominal walls.  The spermatic cord was  then isolated, and a window created  around it, as it had been done on the right side.   A piece of 5x6 inch mesh with a partial longitudinal slit was then passed  into the left extra-peritoneal space and positioned appropriately with two  tails wrapped around the cord.  The mesh was then anchored to Cooper's  ligament, the anterior abdominal wall, and lateral abdominal wall with the  spiral tacked.  This more than adequately covered the direct, indirect, and  femoral spaces.   Following this, a piece of 5x6 inch polypropylene mesh was placed into the  right extra-peritoneal space and inserted posterior to the inferior  epigastric vessels.  It was then spread out and anchored to Cooper's  ligament.  I then was able to position the mesh anteriorly, holding up the  inferior epigastric vessels and placed tacks in the anterior abdominal wall.  I then tacked it laterally to the lateral abdominal wall as  well.  This  provided more than adequate coverage of the direct, indirect, and femoral  spaces.  It was also anchored to Cooper's ligament.   Following this, I noted that hemostasis was adequate.  I then released the  CO2 gas and saw the extra-peritoneal contents approximate the mesh under  laparoscopic vision.  The laparoscope was then reduced.  All of the trocars  were removed.   The right anterior rectus sheath defect was then closed with interrupted 0  Vicryl sutures.  The skin incisions were closed with 4-0 Monocryl  subcuticular stitches, followed by Steri-Strips and sterile dressings.  He  tolerated the procedure well without any apparent complications and  subsequently was taken to the recovery room in satisfactory condition.      TJR/MEDQ  D:  10/09/2003  T:  10/09/2003  Job:  [id number]   cc:   Corwin Levins, M.D. [facility]

## 2010-11-01 ENCOUNTER — Encounter: Payer: Self-pay | Admitting: Gastroenterology

## 2011-01-09 ENCOUNTER — Encounter: Payer: Self-pay | Admitting: Endocrinology

## 2011-01-09 ENCOUNTER — Ambulatory Visit (INDEPENDENT_AMBULATORY_CARE_PROVIDER_SITE_OTHER): Payer: Medicare Other | Admitting: Endocrinology

## 2011-01-09 ENCOUNTER — Other Ambulatory Visit (INDEPENDENT_AMBULATORY_CARE_PROVIDER_SITE_OTHER): Payer: Medicare Other

## 2011-01-09 VITALS — BP 134/82 | HR 107 | Temp 98.0°F | Ht 67.0 in | Wt 169.2 lb

## 2011-01-09 DIAGNOSIS — E785 Hyperlipidemia, unspecified: Secondary | ICD-10-CM

## 2011-01-09 DIAGNOSIS — N259 Disorder resulting from impaired renal tubular function, unspecified: Secondary | ICD-10-CM

## 2011-01-09 DIAGNOSIS — R739 Hyperglycemia, unspecified: Secondary | ICD-10-CM | POA: Insufficient documentation

## 2011-01-09 DIAGNOSIS — J441 Chronic obstructive pulmonary disease with (acute) exacerbation: Secondary | ICD-10-CM

## 2011-01-09 DIAGNOSIS — I10 Essential (primary) hypertension: Secondary | ICD-10-CM

## 2011-01-09 DIAGNOSIS — R7309 Other abnormal glucose: Secondary | ICD-10-CM

## 2011-01-09 DIAGNOSIS — Z79899 Other long term (current) drug therapy: Secondary | ICD-10-CM | POA: Insufficient documentation

## 2011-01-09 DIAGNOSIS — R972 Elevated prostate specific antigen [PSA]: Secondary | ICD-10-CM

## 2011-01-09 DIAGNOSIS — E739 Lactose intolerance, unspecified: Secondary | ICD-10-CM

## 2011-01-09 LAB — HEMOGLOBIN A1C: Hgb A1c MFr Bld: 5.6 % (ref 4.6–6.5)

## 2011-01-09 LAB — CBC WITH DIFFERENTIAL/PLATELET
Eosinophils Relative: 0.1 % (ref 0.0–5.0)
Lymphocytes Relative: 3.3 % — ABNORMAL LOW (ref 12.0–46.0)
Monocytes Relative: 8.5 % (ref 3.0–12.0)
Neutrophils Relative %: 88.1 % — ABNORMAL HIGH (ref 43.0–77.0)
Platelets: 158 10*3/uL (ref 150.0–400.0)
WBC: 7.5 10*3/uL (ref 4.5–10.5)

## 2011-01-09 LAB — URINALYSIS, ROUTINE W REFLEX MICROSCOPIC
Nitrite: NEGATIVE
pH: 6 (ref 5.0–8.0)

## 2011-01-09 LAB — BASIC METABOLIC PANEL
CO2: 29 mEq/L (ref 19–32)
Calcium: 9 mg/dL (ref 8.4–10.5)
Creatinine, Ser: 1.6 mg/dL — ABNORMAL HIGH (ref 0.4–1.5)
GFR: 45.33 mL/min — ABNORMAL LOW (ref >60.00)

## 2011-01-09 LAB — LIPID PANEL
Total CHOL/HDL Ratio: 4
VLDL: 9.4 mg/dL (ref 0.0–40.0)

## 2011-01-09 LAB — TSH: TSH: 0.46 u[IU]/mL (ref 0.35–5.50)

## 2011-01-09 MED ORDER — CEFUROXIME AXETIL 500 MG PO TABS: 250.0000 mg | ORAL_TABLET | Freq: Two times a day (BID) | ORAL | Status: AC

## 2011-01-09 MED ORDER — METHYLPREDNISOLONE (PAK) 4 MG PO TABS: ORAL_TABLET | ORAL | Status: DC

## 2011-01-09 MED ORDER — METHYLPREDNISOLONE (PAK) 4 MG PO TABS: ORAL_TABLET | ORAL | Status: AC

## 2011-01-09 NOTE — Patient Instructions (Addendum)
here is a sample of "advair-100."  take 1 puff 2x a day.  rinse mouth after using. i have sent a prescription to your pharmacy, for an antibiotic, and for a steroid "pack." I hope you feel better soon.  If you don't feel better by next week, please call back.  please see dr Jonny Ruiz soon for a "medicare wellness" visit.   blood tests are being requested for you today.  please call [phone] to hear your test results.  You will be prompted to enter the 9-digit "MRN" number that appears at the top left of this page, followed by #.  Then you will hear the message. (update: i left message on phone-tree:  Make sure you are on mevacor.)

## 2011-01-09 NOTE — Progress Notes (Signed)
  Subjective:    Patient ID: Justin Walters, male    DOB: 25-May-1940, 71 y.o.   MRN: [id number]  HPI Pt states few days of moderate dry-quality cough in the chest, and assoc pain.   Past Medical History  Diagnosis Date  . ABDOMINAL DISTENSION 07/26/2007  . BENIGN PROSTATIC HYPERTROPHY 01/08/2009  . COPD 08/01/2006  . DIVERTICULOSIS, COLON 02/15/2007  . DYSURIA, CHRONIC 07/26/2007  . ERECTILE DYSFUNCTION 04/07/2008  . GLUCOSE INTOLERANCE 10/03/2007  . HYPERLIPIDEMIA 09/23/2006  . HYPERTENSION 09/23/2006  . RENAL CALCULUS, HX OF 09/23/2006  . RENAL INSUFFICIENCY 10/03/2007  . Impaired glucose tolerance 01/14/2011    Past Surgical History  Procedure Date  . Appendectomy   . Cataract extraction   . Inguinal hernia repair 2005    History   Social History  . Marital Status: Married    Spouse Name: N/A    Number of Children: 3  . Years of Education: N/A   Occupational History  . Retired Fish farm manager)    Social History Main Topics  . Smoking status: Former Smoker    Quit date: 01/03/2004  . Smokeless tobacco: Not on file  . Alcohol Use: No  . Drug Use: Not on file  . Sexually Active: Not on file   Other Topics Concern  . Not on file   Social History Narrative  . No narrative on file    Current Outpatient Prescriptions on File Prior to Visit  Medication Sig Dispense Refill  . lovastatin (MEVACOR) 40 MG tablet TAKE 1 TABLET DAILY  90 tablet  1    No Known Allergies  Family History  Problem Relation Age of Onset  . COPD Mother     BP 134/82  Pulse 107  Temp(Src) 98 F (36.7 C) (Oral)  Ht 5\' 7"  (1.702 m)  Wt 169 lb 3.2 oz (76.749 kg)  BMI 26.50 kg/m2  SpO2 92%    Review of Systems Denies fever,  But he has wheezing.      Objective:   Physical Exam VITAL SIGNS:  See vs page GENERAL: no distress head: no deformity eyes: no periorbital swelling, no proptosis external nose and ears are normal mouth: no lesion seen LUNGS:  Clear to auscultation,  except for a few exp wheezes.    Lab Results  Component Value Date   WBC 7.5 01/09/2011   HGB 15.1 01/09/2011   HCT 44.9 01/09/2011   PLT 158.0 01/09/2011   GLUCOSE 110* 01/09/2011   CHOL 182 01/09/2011   TRIG 47.0 01/09/2011   HDL 47.50 01/09/2011   LDLCALC 125* 01/09/2011   ALT 16 01/09/2011   AST 25 01/09/2011   NA 143 01/09/2011   K 4.2 01/09/2011   CL 107 01/09/2011   CREATININE 1.6* 01/09/2011   BUN 26* 01/09/2011   CO2 29 01/09/2011   TSH 0.46 01/09/2011   PSA 0.97 01/09/2011   HGBA1C 5.6 01/09/2011  cxr: nad    Assessment & Plan:  Acute bronchitis, new Dyslipidemia, needs increased rx Renal insuff, unchanged

## 2011-01-10 LAB — HEPATIC FUNCTION PANEL
Bilirubin, Direct: 0.2 mg/dL (ref 0.0–0.3)
Total Protein: 6.5 g/dL (ref 6.0–8.3)

## 2011-01-10 LAB — PTH, INTACT AND CALCIUM: Calcium, Total (PTH): 9.3 mg/dL (ref 8.4–10.5)

## 2011-01-11 ENCOUNTER — Ambulatory Visit (INDEPENDENT_AMBULATORY_CARE_PROVIDER_SITE_OTHER)
Admission: RE | Admit: 2011-01-11 | Discharge: 2011-01-11 | Disposition: A | Discharge status: Home / Self Care | Payer: Medicare Other | Source: Ambulatory Visit | Attending: Endocrinology | Admitting: Endocrinology

## 2011-01-11 DIAGNOSIS — J441 Chronic obstructive pulmonary disease with (acute) exacerbation: Secondary | ICD-10-CM

## 2011-01-14 ENCOUNTER — Encounter: Payer: Self-pay | Admitting: Internal Medicine

## 2011-01-14 DIAGNOSIS — Z0001 Encounter for general adult medical examination with abnormal findings: Secondary | ICD-10-CM | POA: Insufficient documentation

## 2011-01-14 DIAGNOSIS — Z0000 Encounter for general adult medical examination without abnormal findings: Secondary | ICD-10-CM | POA: Insufficient documentation

## 2011-01-14 DIAGNOSIS — R7302 Impaired glucose tolerance (oral): Secondary | ICD-10-CM | POA: Insufficient documentation

## 2011-01-20 ENCOUNTER — Ambulatory Visit (INDEPENDENT_AMBULATORY_CARE_PROVIDER_SITE_OTHER): Payer: Medicare Other | Admitting: Internal Medicine

## 2011-01-20 ENCOUNTER — Encounter: Payer: Self-pay | Admitting: Internal Medicine

## 2011-01-20 VITALS — BP 130/88 | HR 80 | Temp 97.4°F | Ht 67.0 in | Wt 165.4 lb

## 2011-01-20 DIAGNOSIS — R7309 Other abnormal glucose: Secondary | ICD-10-CM

## 2011-01-20 DIAGNOSIS — E785 Hyperlipidemia, unspecified: Secondary | ICD-10-CM

## 2011-01-20 DIAGNOSIS — Z0000 Encounter for general adult medical examination without abnormal findings: Secondary | ICD-10-CM

## 2011-01-20 DIAGNOSIS — J449 Chronic obstructive pulmonary disease, unspecified: Secondary | ICD-10-CM

## 2011-01-20 DIAGNOSIS — I10 Essential (primary) hypertension: Secondary | ICD-10-CM

## 2011-01-20 DIAGNOSIS — R7302 Impaired glucose tolerance (oral): Secondary | ICD-10-CM

## 2011-01-20 MED ORDER — CARVEDILOL 12.5 MG PO TABS: 12.5000 mg | ORAL_TABLET | Freq: Two times a day (BID) | ORAL | Status: DC

## 2011-01-20 MED ORDER — LOVASTATIN 40 MG PO TABS: 40.0000 mg | ORAL_TABLET | Freq: Every day | ORAL | Status: DC

## 2011-01-20 MED ORDER — HYDROCHLOROTHIAZIDE 25 MG PO TABS: 12.5000 mg | ORAL_TABLET | Freq: Every day | ORAL | Status: DC

## 2011-01-20 MED ORDER — ASPIRIN 81 MG PO TBEC: 81.0000 mg | DELAYED_RELEASE_TABLET | Freq: Every day | ORAL | Status: AC

## 2011-01-20 MED ORDER — TERAZOSIN HCL 10 MG PO CAPS: 10.0000 mg | ORAL_CAPSULE | Freq: Every day | ORAL | Status: DC

## 2011-01-20 NOTE — Progress Notes (Signed)
Subjective:    Patient ID: Justin Walters, male    DOB: Aug 25, 1940, 71 y.o.   MRN: [id number]  HPI  Here to f/u; overall doing ok,  Pt denies chest pain, increased sob or doe, wheezing, orthopnea, PND, increased LE swelling, palpitations, dizziness or syncope.  Pt denies new neurological symptoms such as new headache, or facial or extremity weakness or numbness   Pt denies polydipsia, polyuria, or low sugar symptoms such as weakness or confusion improved with po intake.  Pt states overall good compliance with meds, trying to follow lower cholesterol, diabetic diet, wt overall stable but little exercise however.   Pt denies fever, wt loss, night sweats, loss of appetite, or other constitutional symptoms   good medicine tolerability.  Denies worsening depressive symptoms, suicidal ideation, or panic, though has ongoing anxiety, not increased recently.  Denies urinary symptoms such as dysuria, frequency, urgency,or hematuria, now on tersozin per [place] system.  Declines flu shot, due for colonsocopy.  Has run out of most meds a wk ago  - needs refills.  Does c/o an unsual skin sensitivity to the beard area as well the crown of the head, but not pain per se, no swelling, rash or other.  Does have sense of ongoing fatigue, but denies signficant hypersomnolence. Past Medical History  Diagnosis Date  . ABDOMINAL DISTENSION 07/26/2007  . BENIGN PROSTATIC HYPERTROPHY 01/08/2009  . COPD 08/01/2006  . DIVERTICULOSIS, COLON 02/15/2007  . DYSURIA, CHRONIC 07/26/2007  . ERECTILE DYSFUNCTION 04/07/2008  . GLUCOSE INTOLERANCE 10/03/2007  . HYPERLIPIDEMIA 09/23/2006  . HYPERTENSION 09/23/2006  . RENAL CALCULUS, HX OF 09/23/2006  . RENAL INSUFFICIENCY 10/03/2007  . Impaired glucose tolerance 01/14/2011   Past Surgical History  Procedure Date  . Appendectomy   . Cataract extraction   . Inguinal hernia repair 2005    reports that he quit smoking about 7 years ago. He does not have any smokeless tobacco history on file. He reports  that he does not drink alcohol. His drug history not on file. family history includes COPD in his mother. No Known Allergies Current Outpatient Prescriptions on File Prior to Visit  Medication Sig Dispense Refill  . cefUROXime (CEFTIN) 500 MG tablet Take 0.5 tablets (250 mg total) by mouth 2 (two) times daily.  20 tablet  0   Review of Systems Review of Systems  Constitutional: Negative for diaphoresis and unexpected weight change.  HENT: Negative for drooling and tinnitus.   Eyes: Negative for photophobia and visual disturbance.  Respiratory: Negative for choking and stridor.   Gastrointestinal: Negative for vomiting and blood in stool.  Genitourinary: Negative for hematuria and decreased urine volume.  Musculoskeletal: Negative for gait problem.  Skin: Negative for color change and wound.  Neurological: Negative for tremors and numbness.  Psychiatric/Behavioral: Negative for decreased concentration. The patient is not hyperactive.       Objective:   Physical Exam BP 130/88  Pulse 80  Temp(Src) 97.4 F (36.3 C) (Oral)  Ht 5\' 7"  (1.702 m)  Wt 165 lb 6 oz (75.014 kg)  BMI 25.90 kg/m2  SpO2 95% Physical Exam  VS noted, not ill appearing Constitutional: Pt appears well-developed and well-nourished.  HENT: Head: Normocephalic.  Right Ear: External ear normal.  Left Ear: External ear normal.  Eyes: Conjunctivae and EOM are normal. Pupils are equal, round, and reactive to light.  Neck: Normal range of motion. Neck supple.  Cardiovascular: Normal rate and regular rhythm.   Pulmonary/Chest: Effort normal and breath sounds normal.  Abd:  Soft, NT, non-distended, + BS Neurological: Pt is alert. No cranial nerve deficit.  Skin: Skin is warm. No erythema.  Psychiatric: Pt behavior is normal. Thought content normal. 1+ nervous    Assessment & Plan:

## 2011-01-20 NOTE — Patient Instructions (Signed)
Continue all other medications as before You will be contacted regarding the referral for: colonoscopy Please return in 1 year for your yearly visit, or sooner if needed

## 2011-01-20 NOTE — Assessment & Plan Note (Addendum)
stable overall by hx and exam, most recent data reviewed with pt, and pt to continue medical treatment as before, to re-start the statin  Lab Results  Component Value Date   LDLCALC 125* 01/09/2011

## 2011-01-20 NOTE — Assessment & Plan Note (Signed)
stable overall by hx and exam, most recent data reviewed with pt, and pt to continue medical treatment as before  Lab Results  Component Value Date   WBC 7.5 01/09/2011   HGB 15.1 01/09/2011   HCT 44.9 01/09/2011   PLT 158.0 01/09/2011   GLUCOSE 110* 01/09/2011   CHOL 182 01/09/2011   TRIG 47.0 01/09/2011   HDL 47.50 01/09/2011   LDLCALC 125* 01/09/2011   ALT 16 01/09/2011   AST 25 01/09/2011   NA 143 01/09/2011   K 4.2 01/09/2011   CL 107 01/09/2011   CREATININE 1.6* 01/09/2011   BUN 26* 01/09/2011   CO2 29 01/09/2011   TSH 0.46 01/09/2011   PSA 0.97 01/09/2011   HGBA1C 5.6 01/09/2011

## 2011-01-20 NOTE — Assessment & Plan Note (Signed)
stable overall by hx and exam, most recent data reviewed with pt, and pt to continue medical treatment as before  BP Readings from Last 3 Encounters:  01/20/11 130/88  01/09/11 134/82  10/13/09 122/78

## 2011-01-20 NOTE — Assessment & Plan Note (Signed)
Not charged but due for f/u colonospy - wil refer

## 2011-01-20 NOTE — Assessment & Plan Note (Signed)
stable overall by hx and exam, most recent data reviewed with pt, and pt to continue medical treatment as before  SpO2 Readings from Last 3 Encounters:  01/20/11 95%  01/09/11 92%  10/13/09 96%

## 2011-12-20 ENCOUNTER — Other Ambulatory Visit: Payer: Self-pay | Admitting: Family Medicine

## 2011-12-20 ENCOUNTER — Ambulatory Visit
Admission: RE | Admit: 2011-12-20 | Discharge: 2011-12-20 | Disposition: A | Discharge status: Home / Self Care | Payer: No Typology Code available for payment source | Source: Ambulatory Visit | Attending: Family Medicine | Admitting: Family Medicine

## 2011-12-20 DIAGNOSIS — Z006 Encounter for examination for normal comparison and control in clinical research program: Secondary | ICD-10-CM

## 2012-02-05 ENCOUNTER — Other Ambulatory Visit: Payer: Self-pay | Admitting: Internal Medicine

## 2012-02-07 ENCOUNTER — Other Ambulatory Visit: Payer: Self-pay | Admitting: Internal Medicine

## 2012-02-23 ENCOUNTER — Other Ambulatory Visit: Payer: Self-pay | Admitting: Internal Medicine

## 2012-03-28 ENCOUNTER — Other Ambulatory Visit (INDEPENDENT_AMBULATORY_CARE_PROVIDER_SITE_OTHER): Payer: Medicare HMO

## 2012-03-28 ENCOUNTER — Ambulatory Visit (INDEPENDENT_AMBULATORY_CARE_PROVIDER_SITE_OTHER): Payer: Medicare HMO | Admitting: Internal Medicine

## 2012-03-28 ENCOUNTER — Ambulatory Visit (INDEPENDENT_AMBULATORY_CARE_PROVIDER_SITE_OTHER)
Admission: RE | Admit: 2012-03-28 | Discharge: 2012-03-28 | Disposition: A | Discharge status: Home / Self Care | Payer: Medicare HMO | Source: Ambulatory Visit | Attending: Internal Medicine | Admitting: Internal Medicine

## 2012-03-28 ENCOUNTER — Encounter: Payer: Self-pay | Admitting: Internal Medicine

## 2012-03-28 VITALS — BP 110/82 | HR 96 | Temp 97.3°F | Ht 67.0 in | Wt 164.4 lb

## 2012-03-28 DIAGNOSIS — R7302 Impaired glucose tolerance (oral): Secondary | ICD-10-CM

## 2012-03-28 DIAGNOSIS — R109 Unspecified abdominal pain: Secondary | ICD-10-CM

## 2012-03-28 DIAGNOSIS — E785 Hyperlipidemia, unspecified: Secondary | ICD-10-CM

## 2012-03-28 DIAGNOSIS — R079 Chest pain, unspecified: Secondary | ICD-10-CM

## 2012-03-28 DIAGNOSIS — R519 Headache, unspecified: Secondary | ICD-10-CM | POA: Insufficient documentation

## 2012-03-28 DIAGNOSIS — Z0000 Encounter for general adult medical examination without abnormal findings: Secondary | ICD-10-CM

## 2012-03-28 DIAGNOSIS — R51 Headache: Secondary | ICD-10-CM

## 2012-03-28 DIAGNOSIS — R7309 Other abnormal glucose: Secondary | ICD-10-CM

## 2012-03-28 DIAGNOSIS — Z125 Encounter for screening for malignant neoplasm of prostate: Secondary | ICD-10-CM

## 2012-03-28 DIAGNOSIS — R102 Pelvic and perineal pain: Secondary | ICD-10-CM | POA: Insufficient documentation

## 2012-03-28 LAB — HEPATIC FUNCTION PANEL
ALT: 15 U/L (ref 0–53)
AST: 19 U/L (ref 0–37)
Albumin: 4.3 g/dL (ref 3.5–5.2)
Alkaline Phosphatase: 46 U/L (ref 39–117)
Total Protein: 7.1 g/dL (ref 6.0–8.3)

## 2012-03-28 LAB — BASIC METABOLIC PANEL
BUN: 31 mg/dL — ABNORMAL HIGH (ref 6–23)
CO2: 30 mEq/L (ref 19–32)
Glucose, Bld: 93 mg/dL (ref 70–99)
Potassium: 4.5 mEq/L (ref 3.5–5.1)
Sodium: 136 mEq/L (ref 135–145)

## 2012-03-28 LAB — URINALYSIS, ROUTINE W REFLEX MICROSCOPIC
Ketones, ur: NEGATIVE
Leukocytes, UA: NEGATIVE
Nitrite: NEGATIVE
Specific Gravity, Urine: 1.025 (ref 1.000–1.030)
Urobilinogen, UA: 0.2 (ref 0.0–1.0)

## 2012-03-28 LAB — CBC WITH DIFFERENTIAL/PLATELET
Basophils Absolute: 0 10*3/uL (ref 0.0–0.1)
Lymphocytes Relative: 26 % (ref 12.0–46.0)
Lymphs Abs: 1.5 10*3/uL (ref 0.7–4.0)
Monocytes Relative: 12.4 % — ABNORMAL HIGH (ref 3.0–12.0)
Neutrophils Relative %: 55.3 % (ref 43.0–77.0)
Platelets: 198 10*3/uL (ref 150.0–400.0)
RDW: 13.3 % (ref 11.5–14.6)
WBC: 5.8 10*3/uL (ref 4.5–10.5)

## 2012-03-28 LAB — TSH: TSH: 3.43 u[IU]/mL (ref 0.35–5.50)

## 2012-03-28 LAB — HEMOGLOBIN A1C: Hgb A1c MFr Bld: 5.7 % (ref 4.6–6.5)

## 2012-03-28 MED ORDER — CARVEDILOL 12.5 MG PO TABS: ORAL_TABLET | ORAL | Status: DC

## 2012-03-28 MED ORDER — HYDROCHLOROTHIAZIDE 25 MG PO TABS: ORAL_TABLET | ORAL | Status: DC

## 2012-03-28 MED ORDER — ASPIRIN 81 MG PO TBEC: 81.0000 mg | DELAYED_RELEASE_TABLET | Freq: Every day | ORAL | Status: DC

## 2012-03-28 MED ORDER — LOVASTATIN 20 MG PO TABS: 40.0000 mg | ORAL_TABLET | Freq: Every day | ORAL | Status: DC

## 2012-03-28 MED ORDER — DOXYCYCLINE HYCLATE 100 MG PO TABS: 100.0000 mg | ORAL_TABLET | Freq: Two times a day (BID) | ORAL | Status: DC

## 2012-03-28 MED ORDER — TERAZOSIN HCL 10 MG PO CAPS: ORAL_CAPSULE | ORAL | Status: DC

## 2012-03-28 MED ORDER — GABAPENTIN 100 MG PO CAPS: 100.0000 mg | ORAL_CAPSULE | Freq: Three times a day (TID) | ORAL | Status: DC

## 2012-03-28 NOTE — Assessment & Plan Note (Signed)
Possible chronic prostatitis - for doxy course trial

## 2012-03-28 NOTE — Assessment & Plan Note (Signed)
I suspect right occipital neuralgia, for gabapentin trial

## 2012-03-28 NOTE — Assessment & Plan Note (Signed)
For cxr,  to f/u any worsening symptoms or concerns 

## 2012-03-28 NOTE — Progress Notes (Signed)
Subjective:    Patient ID: Justin Walters, male    DOB: 1940/08/31, 72 y.o.   MRN: [id number]  HPI  Here for wellness and f/u;  Overall doing ok;  Pt denies CP, worsening SOB, DOE, wheezing, orthopnea, PND, worsening LE edema, palpitations, dizziness or syncope.  Pt denies neurological change such as new headache, facial or extremity weakness.  Pt denies polydipsia, polyuria, or low sugar symptoms. Pt states overall good compliance with treatment and medications, good tolerability, and has been trying to follow lower cholesterol diet.  Pt denies worsening depressive symptoms, suicidal ideation or panic. No fever, night sweats, wt loss, loss of appetite, or other constitutional symptoms.  Pt states good ability with ADL's, has low fall risk, home safety reviewed and adequate, no other significant changes in hearing or vision, and only occasionally active with exercise.  Did not take lovastatin due to being told it was $85.  Has ongoing pelvic pain, has seen urology, no specific dx per pt.  Also with hx c/w occipital neuralgia like pain to right occiput, chronic recurrent.  Also with mention of an episode 2 days ago sharp left lateral CP, nonexertional, nonpleuritic, nonpositional, lasted several hrs, not recurred. Past Medical History  Diagnosis Date  . ABDOMINAL DISTENSION 07/26/2007  . BENIGN PROSTATIC HYPERTROPHY 01/08/2009  . COPD 08/01/2006  . DIVERTICULOSIS, COLON 02/15/2007  . DYSURIA, CHRONIC 07/26/2007  . ERECTILE DYSFUNCTION 04/07/2008  . GLUCOSE INTOLERANCE 10/03/2007  . HYPERLIPIDEMIA 09/23/2006  . HYPERTENSION 09/23/2006  . RENAL CALCULUS, HX OF 09/23/2006  . RENAL INSUFFICIENCY 10/03/2007  . Impaired glucose tolerance 01/14/2011   Past Surgical History  Procedure Laterality Date  . Appendectomy    . Cataract extraction    . Inguinal hernia repair  2005    reports that he quit smoking about 8 years ago. He does not have any smokeless tobacco history on file. He reports that he does not drink  alcohol. His drug history is not on file. family history includes COPD in his mother. No Known Allergies No current outpatient prescriptions on file prior to visit.   No current facility-administered medications on file prior to visit.   Review of Systems Constitutional: Negative for diaphoresis, activity change, appetite change or unexpected weight change.  HENT: Negative for hearing loss, ear pain, facial swelling, mouth sores and neck stiffness.   Eyes: Negative for pain, redness and visual disturbance.  Respiratory: Negative for shortness of breath and wheezing.   Cardiovascular: Negative for chest pain and palpitations.  Gastrointestinal: Negative for diarrhea, blood in stool, abdominal distention or other pain Genitourinary: Negative for hematuria, flank pain or change in urine volume.  Musculoskeletal: Negative for myalgias and joint swelling.  Skin: Negative for color change and wound.  Neurological: Negative for syncope and numbness. other than noted Hematological: Negative for adenopathy.  Psychiatric/Behavioral: Negative for hallucinations, self-injury, decreased concentration and agitation.      Objective:   Physical Exam BP 110/82  Pulse 96  Temp(Src) 97.3 F (36.3 C) (Oral)  Ht 5\' 7"  (1.702 m)  Wt 164 lb 6 oz (74.56 kg)  BMI 25.74 kg/m2  SpO2 96% VS noted,  Constitutional: Pt is oriented to person, place, and time. Appears well-developed and well-nourished.  Head: Normocephalic and atraumatic.  Right Ear: External ear normal.  Left Ear: External ear normal.  Nose: Nose normal.  Mouth/Throat: Oropharynx is clear and moist.  Eyes: Conjunctivae and EOM are normal. Pupils are equal, round, and reactive to light.  Neck: Normal range of motion. Neck  supple. No JVD present. No tracheal deviation present.  Cardiovascular: Normal rate, regular rhythm, normal heart sounds and intact distal pulses.   Pulmonary/Chest: Effort normal and breath sounds normal.  Abdominal:  Soft. Bowel sounds are normal. There is no tenderness. No HSM  Musculoskeletal: Normal range of motion. Exhibits no edema.  Lymphadenopathy:  Has no cervical adenopathy.  Neurological: Pt is alert and oriented to person, place, and time. Pt has normal reflexes. No cranial nerve deficit.  Skin: Skin is warm and dry. No rash noted.  Psychiatric:  Has  normal mood and affect. Behavior is normal.     Assessment & Plan:

## 2012-03-28 NOTE — Assessment & Plan Note (Signed)

## 2012-03-28 NOTE — Assessment & Plan Note (Signed)
To start lovastatin 20 mg - 2 qd as is on [facility] list Lab Results  Component Value Date   LDLCALC 125* 01/09/2011

## 2012-03-28 NOTE — Patient Instructions (Addendum)
Please take all new medication as prescribed - the antibiotic for the prostate, and the gabapentin for the head pain (probable occipital neuralgia) You can try to increase the gabapentin to 200 mg or 300 mg on your own at one time to see if this helps the pain better, but watch for sleepiness with this (can happen if take higher dosing too much too fast) Please go to the XRAY Department in the Basement (go straight as you get off the elevator) for the x-ray testing Please go to the LAB in the Basement (turn left off the elevator) for the tests to be done today You will be contacted by phone if any changes need to be made immediately.  Otherwise, you will receive a letter about your results with an explanation Please remember to sign up for My Chart if you have not done so, as this will be important to you in the future with finding out test results, communicating by private email, and scheduling acute appointments online when needed. You will be contacted regarding the referral for: colonoscopy Please return in 6 months, or sooner if needed

## 2012-03-28 NOTE — Assessment & Plan Note (Signed)
stable overall by history and exam, recent data reviewed with pt, and pt to continue medical treatment as before,  to f/u any worsening symptoms or concerns Lab Results  Component Value Date   HGBA1C 5.7 03/28/2012

## 2013-02-21 ENCOUNTER — Other Ambulatory Visit: Payer: Self-pay

## 2013-02-21 MED ORDER — TERAZOSIN HCL 10 MG PO CAPS: ORAL_CAPSULE | ORAL | Status: DC

## 2013-04-15 ENCOUNTER — Telehealth: Payer: Self-pay | Admitting: Internal Medicine

## 2013-04-15 MED ORDER — SILDENAFIL CITRATE 100 MG PO TABS: 50.0000 mg | ORAL_TABLET | Freq: Every day | ORAL | Status: DC | PRN

## 2013-04-15 NOTE — Telephone Encounter (Signed)
Done erx 

## 2013-04-15 NOTE — Telephone Encounter (Signed)
Patient informed. 

## 2013-04-15 NOTE — Telephone Encounter (Signed)
Pt request refill for Viagra 100 mg. Pt stated that Dr. Jenny Reichmann gave him back in 11/30/2009. Please advise. Pt is schedule for CPE on 06/18/13. Please call pt

## 2013-05-01 ENCOUNTER — Ambulatory Visit (INDEPENDENT_AMBULATORY_CARE_PROVIDER_SITE_OTHER): Payer: Commercial Managed Care - HMO | Admitting: Internal Medicine

## 2013-05-01 ENCOUNTER — Encounter: Payer: Self-pay | Admitting: Internal Medicine

## 2013-05-01 ENCOUNTER — Other Ambulatory Visit: Payer: Self-pay | Admitting: Internal Medicine

## 2013-05-01 ENCOUNTER — Other Ambulatory Visit (INDEPENDENT_AMBULATORY_CARE_PROVIDER_SITE_OTHER): Payer: Commercial Managed Care - HMO

## 2013-05-01 VITALS — BP 130/80 | HR 59 | Temp 97.5°F | Ht 67.0 in | Wt 158.2 lb

## 2013-05-01 DIAGNOSIS — R7309 Other abnormal glucose: Secondary | ICD-10-CM

## 2013-05-01 DIAGNOSIS — Z0000 Encounter for general adult medical examination without abnormal findings: Secondary | ICD-10-CM

## 2013-05-01 DIAGNOSIS — R972 Elevated prostate specific antigen [PSA]: Secondary | ICD-10-CM

## 2013-05-01 DIAGNOSIS — Z23 Encounter for immunization: Secondary | ICD-10-CM

## 2013-05-01 DIAGNOSIS — R7302 Impaired glucose tolerance (oral): Secondary | ICD-10-CM

## 2013-05-01 DIAGNOSIS — N390 Urinary tract infection, site not specified: Secondary | ICD-10-CM

## 2013-05-01 DIAGNOSIS — R300 Dysuria: Secondary | ICD-10-CM

## 2013-05-01 LAB — HEPATIC FUNCTION PANEL
ALT: 10 U/L (ref 0–53)
AST: 17 U/L (ref 0–37)
Albumin: 4 g/dL (ref 3.5–5.2)
Alkaline Phosphatase: 45 U/L (ref 39–117)
Bilirubin, Direct: 0.1 mg/dL (ref 0.0–0.3)
Total Bilirubin: 0.9 mg/dL (ref 0.3–1.2)
Total Protein: 6.6 g/dL (ref 6.0–8.3)

## 2013-05-01 LAB — LIPID PANEL
CHOLESTEROL: 161 mg/dL (ref 0–200)
HDL: 56.3 mg/dL (ref >39.00)
LDL CALC: 96 mg/dL (ref 0–99)
Total CHOL/HDL Ratio: 3
Triglycerides: 46 mg/dL (ref 0.0–149.0)
VLDL: 9.2 mg/dL (ref 0.0–40.0)

## 2013-05-01 LAB — TSH: TSH: 2.11 u[IU]/mL (ref 0.35–5.50)

## 2013-05-01 LAB — CBC WITH DIFFERENTIAL/PLATELET
BASOS PCT: 0.5 % (ref 0.0–3.0)
Basophils Absolute: 0 10*3/uL (ref 0.0–0.1)
EOS PCT: 4.3 % (ref 0.0–5.0)
Eosinophils Absolute: 0.3 10*3/uL (ref 0.0–0.7)
HEMATOCRIT: 46 % (ref 39.0–52.0)
Hemoglobin: 15.4 g/dL (ref 13.0–17.0)
LYMPHS ABS: 1.2 10*3/uL (ref 0.7–4.0)
Lymphocytes Relative: 20.1 % (ref 12.0–46.0)
MCHC: 33.4 g/dL (ref 30.0–36.0)
MCV: 88.4 fl (ref 78.0–100.0)
MONO ABS: 0.6 10*3/uL (ref 0.1–1.0)
Monocytes Relative: 10.6 % (ref 3.0–12.0)
NEUTROS ABS: 3.8 10*3/uL (ref 1.4–7.7)
Neutrophils Relative %: 64.5 % (ref 43.0–77.0)
PLATELETS: 214 10*3/uL (ref 150.0–400.0)
RBC: 5.2 Mil/uL (ref 4.22–5.81)
RDW: 13.4 % (ref 11.5–14.6)
WBC: 5.8 10*3/uL (ref 4.5–10.5)

## 2013-05-01 LAB — URINALYSIS, ROUTINE W REFLEX MICROSCOPIC
BILIRUBIN URINE: NEGATIVE
Hgb urine dipstick: NEGATIVE
KETONES UR: NEGATIVE
NITRITE: NEGATIVE
RBC / HPF: NONE SEEN (ref 0–?)
Specific Gravity, Urine: 1.01 (ref 1.000–1.030)
URINE GLUCOSE: NEGATIVE
UROBILINOGEN UA: 0.2 (ref 0.0–1.0)
pH: 7.5 (ref 5.0–8.0)

## 2013-05-01 LAB — BASIC METABOLIC PANEL
BUN: 24 mg/dL — AB (ref 6–23)
CO2: 33 mEq/L — ABNORMAL HIGH (ref 19–32)
Calcium: 9.3 mg/dL (ref 8.4–10.5)
Chloride: 98 mEq/L (ref 96–112)
Creatinine, Ser: 1.4 mg/dL (ref 0.4–1.5)
GFR: 55.18 mL/min — ABNORMAL LOW (ref >60.00)
Glucose, Bld: 94 mg/dL (ref 70–99)
Potassium: 4.8 mEq/L (ref 3.5–5.1)
Sodium: 138 mEq/L (ref 135–145)

## 2013-05-01 LAB — HEMOGLOBIN A1C: Hgb A1c MFr Bld: 5.5 % (ref 4.6–6.5)

## 2013-05-01 LAB — PSA: PSA: 34.5 ng/mL — ABNORMAL HIGH (ref 0.10–4.00)

## 2013-05-01 MED ORDER — TADALAFIL 20 MG PO TABS: 20.0000 mg | ORAL_TABLET | Freq: Every day | ORAL | Status: DC | PRN

## 2013-05-01 MED ORDER — LEVOFLOXACIN 250 MG PO TABS: 250.0000 mg | ORAL_TABLET | Freq: Every day | ORAL | Status: DC

## 2013-05-01 MED ORDER — TERAZOSIN HCL 10 MG PO CAPS: ORAL_CAPSULE | ORAL | Status: DC

## 2013-05-01 MED ORDER — CARVEDILOL 12.5 MG PO TABS
ORAL_TABLET | ORAL | Status: DC
Start: 2013-05-01 — End: 2013-07-04

## 2013-05-01 MED ORDER — LOVASTATIN 20 MG PO TABS: 40.0000 mg | ORAL_TABLET | Freq: Every day | ORAL | Status: DC

## 2013-05-01 MED ORDER — HYDROCHLOROTHIAZIDE 25 MG PO TABS: ORAL_TABLET | ORAL | Status: DC

## 2013-05-01 NOTE — Assessment & Plan Note (Signed)

## 2013-05-01 NOTE — Progress Notes (Signed)
Pre visit review using our clinic review tool, if applicable. No additional management support is needed unless otherwise documented below in the visit note. 

## 2013-05-01 NOTE — Assessment & Plan Note (Signed)
With flare, prob UTI , for urine cx, also levaquin asd

## 2013-05-01 NOTE — Addendum Note (Signed)
Addended by: Sharon Seller B on: 05/01/2013 11:26 AM   Modules accepted: Orders

## 2013-05-01 NOTE — Addendum Note (Signed)
Addended by: Biagio Borg on: 05/01/2013 11:35 AM   Modules accepted: Orders

## 2013-05-01 NOTE — Patient Instructions (Addendum)
You had the new Prevnar pneumonia shot today  Please take all new medication as prescribed  - the antibiotic  Please continue all other medications as before, and refills have been done if requested. Please have the pharmacy call with any other refills you may need.  Please continue your efforts at being more active, low cholesterol diet, and weight control. You are otherwise up to date with prevention measures today.  Please keep your appointments with your specialists as you may have planned  We will send the form regarding the [facility] and shingles shot to [facility], and hopefully you should hear about this soon  We will cancel the June appt.  Please go to the LAB in the Basement (turn left off the elevator) for the tests to be done today  You will be contacted by phone if any changes need to be made immediately.  Otherwise, you will receive a letter about your results with an explanation, but please check with MyChart first.  Please return in 1 year for your yearly visit, or sooner if needed, with Lab testing done 3-5 days before

## 2013-05-01 NOTE — Progress Notes (Signed)
Subjective:    Patient ID: Justin Walters, male    DOB: 1940-10-29, 73 y.o.   MRN: [id number]  HPI  Here for wellness and f/u;  Overall doing ok;  Pt denies CP, worsening SOB, DOE, wheezing, orthopnea, PND, worsening LE edema, palpitations, dizziness or syncope.  Pt denies neurological change such as new headache, facial or extremity weakness.  Pt denies polydipsia, polyuria, or low sugar symptoms. Pt states overall good compliance with treatment and medications, good tolerability, and has been trying to follow lower cholesterol diet.  Pt denies worsening depressive symptoms, suicidal ideation or panic. No fever, night sweats, wt loss, loss of appetite, or other constitutional symptoms.  Pt states good ability with ADL's, has low fall risk, home safety reviewed and adequate, no other significant changes in hearing or vision, and only occasionally active with exercise. Does have dysuria incidnetly for last 4 days with some freq, and feeling off, but no fever, chills; has ongoing difficulty with starting urinary stream, good compliance with terazosin.  Has seen urology in past [facility] urology).  Has hx of remote UTi similar in past with me, and several with urologist.  Past Medical History  Diagnosis Date  . ABDOMINAL DISTENSION 07/26/2007  . BENIGN PROSTATIC HYPERTROPHY 01/08/2009  . COPD 08/01/2006  . DIVERTICULOSIS, COLON 02/15/2007  . DYSURIA, CHRONIC 07/26/2007  . ERECTILE DYSFUNCTION 04/07/2008  . GLUCOSE INTOLERANCE 10/03/2007  . HYPERLIPIDEMIA 09/23/2006  . HYPERTENSION 09/23/2006  . RENAL CALCULUS, HX OF 09/23/2006  . RENAL INSUFFICIENCY 10/03/2007  . Impaired glucose tolerance 01/14/2011   Past Surgical History  Procedure Laterality Date  . Appendectomy    . Cataract extraction    . Inguinal hernia repair  2005    reports that he quit smoking about 9 years ago. He does not have any smokeless tobacco history on file. He reports that he does not drink alcohol. His drug history is not on  file. family history includes COPD in his mother. No Known Allergies Current Outpatient Prescriptions on File Prior to Visit  Medication Sig Dispense Refill  . aspirin 81 MG EC tablet Take 1 tablet (81 mg total) by mouth daily. Swallow whole.  30 tablet  12  . carvedilol (COREG) 12.5 MG tablet TAKE ONE TABLET BY MOUTH TWICE DAILY WITH MEALS  180 tablet  3  . hydrochlorothiazide (HYDRODIURIL) 25 MG tablet TAKE ONE-HALF TABLET BY MOUTH EVERY DAY  90 tablet  1  . sildenafil (VIAGRA) 100 MG tablet Take 0.5-1 tablets (50-100 mg total) by mouth daily as needed for erectile dysfunction.  5 tablet  11  . terazosin (HYTRIN) 10 MG capsule TAKE ONE CAPSULE BY MOUTH AT BEDTIME  90 capsule  0   No current facility-administered medications on file prior to visit.   Review of Systems Constitutional: Negative for increased diaphoresis, other activity, appetite or other siginficant weight change  HENT: Negative for worsening hearing loss, ear pain, facial swelling, mouth sores and neck stiffness.   Eyes: Negative for other worsening pain, redness or visual disturbance.  Respiratory: Negative for shortness of breath and wheezing.   Cardiovascular: Negative for chest pain and palpitations.  Gastrointestinal: Negative for diarrhea, blood in stool, abdominal distention or other pain Genitourinary: Negative for hematuria, flank pain or change in urine volume.  Musculoskeletal: Negative for myalgias or other joint complaints.  Skin: Negative for color change and wound.  Neurological: Negative for syncope and numbness. other than noted Hematological: Negative for adenopathy. or other swelling Psychiatric/Behavioral: Negative for hallucinations, self-injury, decreased  concentration or other worsening agitation.      Objective:   Physical Exam BP 130/80  Pulse 59  Temp(Src) 97.5 F (36.4 C) (Oral)  Ht 5\' 7"  (1.702 m)  Wt 158 lb 4 oz (71.782 kg)  BMI 24.78 kg/m2  SpO2 97% VS noted,  Constitutional: Pt is  oriented to person, place, and time. Appears well-developed and well-nourished.  Head: Normocephalic and atraumatic.  Right Ear: External ear normal.  Left Ear: External ear normal.  Nose: Nose normal.  Mouth/Throat: Oropharynx is clear and moist.  Eyes: Conjunctivae and EOM are normal. Pupils are equal, round, and reactive to light.  Neck: Normal range of motion. Neck supple. No JVD present. No tracheal deviation present.  Cardiovascular: Normal rate, regular rhythm, normal heart sounds and intact distal pulses.   Pulmonary/Chest: Effort normal and breath sounds without rales or wheezing  Abdominal: Soft. Bowel sounds are normal. NT. No HSM  Musculoskeletal: Normal range of motion. Exhibits no edema.  Lymphadenopathy:  Has no cervical adenopathy.  Neurological: Pt is alert and oriented to person, place, and time. Pt has normal reflexes. No cranial nerve deficit. Motor grossly intact No flank tender Skin: Skin is warm and dry. No rash noted.  Psychiatric:  Has normal mood and affect. Behavior is normal.     Assessment & Plan:

## 2013-06-03 ENCOUNTER — Other Ambulatory Visit: Payer: Self-pay | Admitting: Urology

## 2013-06-12 ENCOUNTER — Encounter (HOSPITAL_BASED_OUTPATIENT_CLINIC_OR_DEPARTMENT_OTHER): Payer: Self-pay | Admitting: *Deleted

## 2013-06-13 ENCOUNTER — Encounter (HOSPITAL_BASED_OUTPATIENT_CLINIC_OR_DEPARTMENT_OTHER): Payer: Self-pay | Admitting: *Deleted

## 2013-06-13 NOTE — Progress Notes (Addendum)
NPO AFTER MN. ARRIVE AT 4765. NEED ISTAT AND EKG. WILL TAKE COREG AM DOS W/ SIPS OF WATER.   REVIEWED [facility] GUIDELINES , WILL BRING MEDS.  PT UNDERSTANDS TO STOP ASA ON 06-18-2013.

## 2013-06-13 NOTE — Progress Notes (Signed)
06/13/13 1246  OBSTRUCTIVE SLEEP APNEA  Have you ever been diagnosed with sleep apnea through a sleep study? No  Do you snore loudly (loud enough to be heard through closed doors)?  1  Do you often feel tired, fatigued, or sleepy during the daytime? 0  Has anyone observed you stop breathing during your sleep? 0  Do you have, or are you being treated for high blood pressure? 1  BMI more than 35 kg/m2? 0  Age over 72 years old? 1  Neck circumference greater than 40 cm/16 inches? 1  Gender: 1  Obstructive Sleep Apnea Score 5  Score 4 or greater  Results sent to PCP

## 2013-06-18 ENCOUNTER — Encounter: Payer: Medicare HMO | Admitting: Internal Medicine

## 2013-06-23 ENCOUNTER — Ambulatory Visit (HOSPITAL_BASED_OUTPATIENT_CLINIC_OR_DEPARTMENT_OTHER)
Admission: RE | Admit: 2013-06-23 | Discharge: 2013-06-24 | Disposition: A | Discharge status: Home / Self Care | Payer: Medicare HMO | Source: Ambulatory Visit | Attending: Urology | Admitting: Urology

## 2013-06-23 ENCOUNTER — Encounter (HOSPITAL_BASED_OUTPATIENT_CLINIC_OR_DEPARTMENT_OTHER): Payer: Medicare HMO | Admitting: Anesthesiology

## 2013-06-23 ENCOUNTER — Other Ambulatory Visit: Payer: Self-pay

## 2013-06-23 ENCOUNTER — Ambulatory Visit (HOSPITAL_BASED_OUTPATIENT_CLINIC_OR_DEPARTMENT_OTHER): Payer: Medicare HMO | Admitting: Anesthesiology

## 2013-06-23 ENCOUNTER — Encounter (HOSPITAL_BASED_OUTPATIENT_CLINIC_OR_DEPARTMENT_OTHER)
Admission: RE | Disposition: A | Discharge status: Home / Self Care | Payer: Commercial Managed Care - HMO | Source: Ambulatory Visit | Attending: Urology

## 2013-06-23 ENCOUNTER — Encounter (HOSPITAL_BASED_OUTPATIENT_CLINIC_OR_DEPARTMENT_OTHER): Payer: Self-pay | Admitting: Anesthesiology

## 2013-06-23 DIAGNOSIS — R972 Elevated prostate specific antigen [PSA]: Secondary | ICD-10-CM | POA: Insufficient documentation

## 2013-06-23 DIAGNOSIS — N210 Calculus in bladder: Secondary | ICD-10-CM | POA: Insufficient documentation

## 2013-06-23 DIAGNOSIS — IMO0002 Reserved for concepts with insufficient information to code with codable children: Secondary | ICD-10-CM | POA: Diagnosis not present

## 2013-06-23 DIAGNOSIS — Z79899 Other long term (current) drug therapy: Secondary | ICD-10-CM | POA: Insufficient documentation

## 2013-06-23 DIAGNOSIS — E7800 Pure hypercholesterolemia, unspecified: Secondary | ICD-10-CM | POA: Insufficient documentation

## 2013-06-23 DIAGNOSIS — N401 Enlarged prostate with lower urinary tract symptoms: Secondary | ICD-10-CM | POA: Insufficient documentation

## 2013-06-23 DIAGNOSIS — I10 Essential (primary) hypertension: Secondary | ICD-10-CM | POA: Insufficient documentation

## 2013-06-23 DIAGNOSIS — J4489 Other specified chronic obstructive pulmonary disease: Secondary | ICD-10-CM | POA: Insufficient documentation

## 2013-06-23 DIAGNOSIS — R350 Frequency of micturition: Secondary | ICD-10-CM | POA: Insufficient documentation

## 2013-06-23 DIAGNOSIS — Z7982 Long term (current) use of aspirin: Secondary | ICD-10-CM | POA: Insufficient documentation

## 2013-06-23 DIAGNOSIS — N138 Other obstructive and reflux uropathy: Secondary | ICD-10-CM | POA: Diagnosis not present

## 2013-06-23 DIAGNOSIS — N139 Obstructive and reflux uropathy, unspecified: Secondary | ICD-10-CM | POA: Insufficient documentation

## 2013-06-23 DIAGNOSIS — J449 Chronic obstructive pulmonary disease, unspecified: Secondary | ICD-10-CM | POA: Insufficient documentation

## 2013-06-23 DIAGNOSIS — Z87891 Personal history of nicotine dependence: Secondary | ICD-10-CM | POA: Insufficient documentation

## 2013-06-23 HISTORY — PX: TRANSURETHRAL RESECTION OF PROSTATE: SHX73

## 2013-06-23 HISTORY — DX: Calculus in bladder: N21.0

## 2013-06-23 HISTORY — PX: CYSTOSCOPY: SHX5120

## 2013-06-23 HISTORY — DX: Diverticulosis of large intestine without perforation or abscess without bleeding: K57.30

## 2013-06-23 HISTORY — DX: Benign prostatic hyperplasia without lower urinary tract symptoms: N40.0

## 2013-06-23 HISTORY — PX: HOLMIUM LASER APPLICATION: SHX5852

## 2013-06-23 HISTORY — DX: Impaired glucose tolerance (oral): R73.02

## 2013-06-23 HISTORY — DX: Essential (primary) hypertension: I10

## 2013-06-23 HISTORY — DX: Personal history of urinary calculi: Z87.442

## 2013-06-23 HISTORY — DX: Hyperlipidemia, unspecified: E78.5

## 2013-06-23 HISTORY — DX: Other specified personal risk factors, not elsewhere classified: Z91.89

## 2013-06-23 LAB — POCT I-STAT, CHEM 8
BUN: 33 mg/dL — ABNORMAL HIGH (ref 6–23)
CHLORIDE: 97 meq/L (ref 96–112)
Calcium, Ion: 1.2 mmol/L (ref 1.13–1.30)
Creatinine, Ser: 1.5 mg/dL — ABNORMAL HIGH (ref 0.50–1.35)
Glucose, Bld: 104 mg/dL — ABNORMAL HIGH (ref 70–99)
HEMATOCRIT: 48 % (ref 39.0–52.0)
HEMOGLOBIN: 16.3 g/dL (ref 13.0–17.0)
POTASSIUM: 3.7 meq/L (ref 3.7–5.3)
SODIUM: 141 meq/L (ref 137–147)
TCO2: 27 mmol/L (ref 0–100)

## 2013-06-23 SURGERY — TRANSURETHRAL RESECTION OF THE PROSTATE WITH GYRUS INSTRUMENTS
Anesthesia: General | Site: Prostate

## 2013-06-23 MED ORDER — ZOLPIDEM TARTRATE 5 MG PO TABS
ORAL_TABLET | ORAL | Status: AC
  Filled 2013-06-23: qty 1

## 2013-06-23 MED ORDER — FENTANYL CITRATE 0.05 MG/ML IJ SOLN
INTRAMUSCULAR | Status: AC
  Filled 2013-06-23: qty 2

## 2013-06-23 MED ORDER — KETOROLAC TROMETHAMINE 30 MG/ML IJ SOLN
INTRAMUSCULAR | Status: DC | PRN
  Administered 2013-06-23: 30 mg via INTRAVENOUS

## 2013-06-23 MED ORDER — ZOLPIDEM TARTRATE 5 MG PO TABS
5.0000 mg | ORAL_TABLET | Freq: Every evening | ORAL | Status: DC | PRN
  Administered 2013-06-23: 5 mg via ORAL
  Filled 2013-06-23: qty 1

## 2013-06-23 MED ORDER — ONDANSETRON HCL 4 MG/2ML IJ SOLN
INTRAMUSCULAR | Status: DC | PRN
  Administered 2013-06-23: 4 mg via INTRAVENOUS

## 2013-06-23 MED ORDER — MIDAZOLAM HCL 5 MG/5ML IJ SOLN
INTRAMUSCULAR | Status: DC | PRN
  Administered 2013-06-23: 2 mg via INTRAVENOUS

## 2013-06-23 MED ORDER — FENTANYL CITRATE 0.05 MG/ML IJ SOLN
25.0000 ug | INTRAMUSCULAR | Status: DC | PRN
  Administered 2013-06-23 (×4): 50 ug via INTRAVENOUS
  Filled 2013-06-23: qty 1

## 2013-06-23 MED ORDER — IOHEXOL 350 MG/ML SOLN
INTRAVENOUS | Status: DC | PRN
  Administered 2013-06-23: 50 mL

## 2013-06-23 MED ORDER — MIDAZOLAM HCL 2 MG/2ML IJ SOLN
INTRAMUSCULAR | Status: AC
  Filled 2013-06-23: qty 2

## 2013-06-23 MED ORDER — EPHEDRINE SULFATE 50 MG/ML IJ SOLN
INTRAMUSCULAR | Status: DC | PRN
  Administered 2013-06-23: 10 mg via INTRAVENOUS
  Administered 2013-06-23: 15 mg via INTRAVENOUS
  Administered 2013-06-23: 10 mg via INTRAVENOUS
  Administered 2013-06-23 (×2): 5 mg via INTRAVENOUS
  Administered 2013-06-23: 15 mg via INTRAVENOUS

## 2013-06-23 MED ORDER — SODIUM CHLORIDE 0.9 % IR SOLN
Status: DC | PRN
Start: 2013-06-23 — End: 2013-06-23
  Administered 2013-06-23: 3000 mL via INTRAVESICAL
  Administered 2013-06-23: 9000 mL via INTRAVESICAL
  Administered 2013-06-23: 3000 mL via INTRAVESICAL

## 2013-06-23 MED ORDER — STERILE WATER FOR IRRIGATION IR SOLN
Status: DC | PRN
  Administered 2013-06-23: 500 mL

## 2013-06-23 MED ORDER — FENTANYL CITRATE 0.05 MG/ML IJ SOLN
INTRAMUSCULAR | Status: DC | PRN
  Administered 2013-06-23 (×3): 50 ug via INTRAVENOUS
  Administered 2013-06-23: 25 ug via INTRAVENOUS
  Administered 2013-06-23: 50 ug via INTRAVENOUS
  Administered 2013-06-23: 25 ug via INTRAVENOUS
  Administered 2013-06-23: 50 ug via INTRAVENOUS

## 2013-06-23 MED ORDER — OXYCODONE-ACETAMINOPHEN 5-325 MG PO TABS
ORAL_TABLET | ORAL | Status: AC
  Filled 2013-06-23: qty 1

## 2013-06-23 MED ORDER — PROMETHAZINE HCL 25 MG/ML IJ SOLN
6.2500 mg | INTRAMUSCULAR | Status: DC | PRN
Start: 2013-06-23 — End: 2013-06-23
  Filled 2013-06-23: qty 1

## 2013-06-23 MED ORDER — CIPROFLOXACIN IN D5W 400 MG/200ML IV SOLN
400.0000 mg | INTRAVENOUS | Status: AC
  Administered 2013-06-23: 400 mg via INTRAVENOUS
  Filled 2013-06-23: qty 200

## 2013-06-23 MED ORDER — CARVEDILOL 12.5 MG PO TABS
12.5000 mg | ORAL_TABLET | Freq: Two times a day (BID) | ORAL | Status: DC
Start: 2013-06-24 — End: 2013-06-24
  Filled 2013-06-23: qty 1

## 2013-06-23 MED ORDER — DEXAMETHASONE SODIUM PHOSPHATE 10 MG/ML IJ SOLN
INTRAMUSCULAR | Status: DC | PRN
  Administered 2013-06-23: 10 mg via INTRAVENOUS

## 2013-06-23 MED ORDER — LACTATED RINGERS IV SOLN
INTRAVENOUS | Status: DC
  Administered 2013-06-23: 17:00:00 via INTRAVENOUS
  Filled 2013-06-23: qty 1000

## 2013-06-23 MED ORDER — HYDROMORPHONE BOLUS VIA INFUSION
1.0000 mg | INTRAVENOUS | Status: DC | PRN
  Filled 2013-06-23: qty 1

## 2013-06-23 MED ORDER — HYDROCHLOROTHIAZIDE 25 MG PO TABS
25.0000 mg | ORAL_TABLET | Freq: Every day | ORAL | Status: DC
  Filled 2013-06-23: qty 1

## 2013-06-23 MED ORDER — PROPOFOL INFUSION 10 MG/ML OPTIME
INTRAVENOUS | Status: DC | PRN
  Administered 2013-06-23: 150 mL via INTRAVENOUS
  Administered 2013-06-23: 50 mL via INTRAVENOUS

## 2013-06-23 MED ORDER — OXYCODONE-ACETAMINOPHEN 5-325 MG PO TABS
1.0000 | ORAL_TABLET | ORAL | Status: DC | PRN
  Administered 2013-06-23 – 2013-06-24 (×3): 1 via ORAL
  Filled 2013-06-23: qty 1

## 2013-06-23 MED ORDER — HYDROMORPHONE HCL PF 1 MG/ML IJ SOLN
INTRAMUSCULAR | Status: AC
  Filled 2013-06-23: qty 1

## 2013-06-23 MED ORDER — FENTANYL CITRATE 0.05 MG/ML IJ SOLN
INTRAMUSCULAR | Status: AC
  Filled 2013-06-23: qty 4

## 2013-06-23 MED ORDER — LACTATED RINGERS IV SOLN
INTRAVENOUS | Status: DC
  Administered 2013-06-23 (×2): via INTRAVENOUS
  Filled 2013-06-23: qty 1000

## 2013-06-23 MED ORDER — LIDOCAINE HCL (CARDIAC) 20 MG/ML IV SOLN
INTRAVENOUS | Status: DC | PRN
  Administered 2013-06-23: 80 mg via INTRAVENOUS

## 2013-06-23 SURGICAL SUPPLY — 58 items
22FR 30CC HEMATURIA COUDE IMPLANT
ADAPTER CATH URET PLST 4-6FR (CATHETERS) IMPLANT
ADPR CATH URET STRL DISP 4-6FR (CATHETERS)
BAG DRAIN URO-CYSTO SKYTR STRL (DRAIN) ×3 IMPLANT
BAG DRN UROCATH (DRAIN) ×2
BAG URINE DRAINAGE (UROLOGICAL SUPPLIES) ×2 IMPLANT
BARD 10MM FLAT FLUTED DRAIN ×1 IMPLANT
CANISTER SUCT LVC 12 LTR MEDI- (MISCELLANEOUS) ×2 IMPLANT
CARTRIDGE STONEBREAK CO2 KIDNE (ELECTROSURGICAL) ×2 IMPLANT
CATH COUDE FOLEY 2W 5CC 22FR (CATHETERS) ×1 IMPLANT
CATH FOLEY 3WAY 30CC 22F (CATHETERS) ×1 IMPLANT
CATH HEMA 3WAY 30CC 24FR COUDE (CATHETERS) ×1 IMPLANT
CATH HEMA 3WAY 30CC 24FR RND (CATHETERS) ×1 IMPLANT
CLEANER CAUTERY TIP 5X5 PAD (MISCELLANEOUS) IMPLANT
CLOTH BEACON ORANGE TIMEOUT ST (SAFETY) ×3 IMPLANT
COVER MAYO STAND STRL (DRAPES) ×1 IMPLANT
COVER TABLE BACK 60X90 (DRAPES) ×1 IMPLANT
DRAPE CAMERA CLOSED 9X96 (DRAPES) ×3 IMPLANT
DRAPE LAPAROTOMY T 102X78X121 (DRAPES) ×1 IMPLANT
DRSG TEGADERM 4X4.75 (GAUZE/BANDAGES/DRESSINGS) ×1 IMPLANT
ELECT LOOP MED HF 24F 12D CBL (CLIP) ×1 IMPLANT
ELECT REM PT RETURN 9FT ADLT (ELECTROSURGICAL) ×3
ELECTRODE REM PT RTRN 9FT ADLT (ELECTROSURGICAL) IMPLANT
EVACUATOR SILICONE 100CC (DRAIN) ×1 IMPLANT
FIBER LASER FLEXIVA 1000 (UROLOGICAL SUPPLIES) ×3 IMPLANT
GAUZE SPONGE 4X4 16PLY XRAY LF (GAUZE/BANDAGES/DRESSINGS) ×1 IMPLANT
GLOVE BIO SURGEON STRL SZ 6 (GLOVE) ×2 IMPLANT
GLOVE BIO SURGEON STRL SZ7 (GLOVE) ×6 IMPLANT
GLOVE INDICATOR 6.5 STRL GRN (GLOVE) ×2 IMPLANT
GOWN STRL REUS W/ TWL XL LVL3 (GOWN DISPOSABLE) IMPLANT
GOWN STRL REUS W/TWL LRG LVL3 (GOWN DISPOSABLE) ×2 IMPLANT
GOWN STRL REUS W/TWL XL LVL3 (GOWN DISPOSABLE) ×3
HOLDER FOLEY CATH W/STRAP (MISCELLANEOUS) ×1 IMPLANT
IV NS IRRIG 3000ML ARTHROMATIC (IV SOLUTION) ×5 IMPLANT
NS IRRIG 500ML POUR BTL (IV SOLUTION) ×1 IMPLANT
PACK BASIN DAY SURGERY FS (CUSTOM PROCEDURE TRAY) ×1 IMPLANT
PACK CYSTOSCOPY (CUSTOM PROCEDURE TRAY) ×3 IMPLANT
PAD CLEANER CAUTERY TIP 5X5 (MISCELLANEOUS) ×1
PENCIL BUTTON HOLSTER BLD 10FT (ELECTRODE) ×1 IMPLANT
PLUG CATH AND CAP STER (CATHETERS) ×1 IMPLANT
PROBE PNEUMATIC 1.6MM (ELECTROSURGICAL) ×1 IMPLANT
SET ASPIRATION TUBING (TUBING) IMPLANT
SET BERKELEY SUCTION TUBING (SUCTIONS) ×1 IMPLANT
SPONGE GAUZE 4X4 12PLY (GAUZE/BANDAGES/DRESSINGS) ×1 IMPLANT
SPONGE LAP 18X18 X RAY DECT (DISPOSABLE) ×1 IMPLANT
SPONGE LAP 4X18 X RAY DECT (DISPOSABLE) ×1 IMPLANT
SUT ETHILON 3 0 FSL (SUTURE) ×1 IMPLANT
SUT MNCRL AB 4-0 PS2 18 (SUTURE) ×1 IMPLANT
SUT VIC AB 2-0 UR6 27 (SUTURE) ×2 IMPLANT
SUT VIC AB 3-0 SH 27 (SUTURE) ×3
SUT VIC AB 3-0 SH 27X BRD (SUTURE) IMPLANT
SUT VICRYL 0 UR6 27IN ABS (SUTURE) ×2 IMPLANT
SUT VICRYL 3 0 UR 6 27 (SUTURE) ×2 IMPLANT
SYR 30ML LL (SYRINGE) ×3 IMPLANT
SYR BULB IRRIGATION 50ML (SYRINGE) ×1 IMPLANT
SYRINGE IRR TOOMEY STRL 70CC (SYRINGE) ×1 IMPLANT
TOWEL OR 17X26 4PK STRL BLUE (TOWEL DISPOSABLE) ×3 IMPLANT
YANKAUER SUCT BULB TIP NO VENT (SUCTIONS) ×1 IMPLANT

## 2013-06-23 NOTE — Anesthesia Preprocedure Evaluation (Addendum)
Anesthesia Evaluation  Patient identified by MRN, date of birth, ID band Patient awake    Reviewed: Allergy & Precautions, H&P , NPO status , Patient's Chart, lab work & pertinent test results  Airway Mallampati: II TM Distance: >3 FB Neck ROM: Full    Dental  (+) Partial Upper, Dental Advisory Given   Pulmonary COPDformer smoker,  *RADIOLOGY REPORT*   Clinical Data: COPD, former smoker, hypertension   CHEST - 2 VIEW   Comparison: 12/20/2011  Chest x-ray Findings: Normal heart size, mediastinal contours, and pulmonary vascularity. Atherosclerotic calcification aorta. Emphysematous changes consistent with COPD. Calcified granuloma left upper lobe stable. No acute infiltrate, pleural effusion, or pneumothorax. Bones unremarkable.  breath sounds clear to auscultation  Pulmonary exam normal       Cardiovascular Exercise Tolerance: Good hypertension, Pt. on medications and Pt. on home beta blockers negative cardio ROS  Rhythm:Regular Rate:Normal  23-Jun-2013  Normal sinus rhythm Normal ECG   Neuro/Psych  Headaches, negative psych ROS   GI/Hepatic negative GI ROS, Neg liver ROS,   Endo/Other  negative endocrine ROS  Renal/GU   negative genitourinary   Musculoskeletal negative musculoskeletal ROS (+)   Abdominal   Peds  Hematology negative hematology ROS (+)   Anesthesia Other Findings   Reproductive/Obstetrics                      Anesthesia Physical Anesthesia Plan  ASA: II  Anesthesia Plan: General   Post-op Pain Management:    Induction: Intravenous  Airway Management Planned: LMA  Additional Equipment:   Intra-op Plan:   Post-operative Plan: Extubation in OR  Informed Consent: I have reviewed the patients History and Physical, chart, labs and discussed the procedure including the risks, benefits and alternatives for the proposed anesthesia with the patient or authorized  representative who has indicated his/her understanding and acceptance.   Dental advisory given  Plan Discussed with: CRNA  Anesthesia Plan Comments:         Anesthesia Quick Evaluation

## 2013-06-23 NOTE — Op Note (Addendum)
Justin Walters is a 73 y.o.   06/23/2013  General  Preop diagnosis: Bladder calculus, BPH  Postop diagnosis: Bladder calculus, BPH, bladder perforation  Procedure done: Cystoscopy, cystolitholapaxy, exploration of bladder, repair of bladder perforation.  Surgeon: Charlene Brooke. Nesi  Anesthesia: General  Indication: Patient is a 73 years old male who had been complaining of frequency, urgency. Renal ultrasound showed normal upper tract and a hyperechoic lesion in the bladder. Cystoscopy showed a 2 cm bladder stone. He also has moderate prostatic hypertrophy. He is scheduled today for cystoscopy, cystolitholapaxy.  Procedure: Patient was identified by his wrist band and proper timeout was taken.  Under general anesthesia he was prepped and draped and placed in the dorsolithotomy position. A panendoscope was inserted in the bladder. The anterior urethra is normal. He has moderate prostatic hypertrophy. There is no tumor in the bladder. There is a large stone in the bladder that measures about 2 cm. A Lenore Cordia was passed through the cystoscope. It was difficult to treat the stone with the [facility]. It was possible to only remove small stone fragments with the [facility]. A 1000 microfiber holmium laser was then used to fragment the stone. It was still difficult to break the stone. I then used again the [facility] and was able to partially fragment the stone. I went back to the holmium laser and after about an hour I was able to break the stone in multiple smaller fragments. It was a very difficult and tedious stone fragmentation.  The stone was very hard.  The stone fragments were then irrigated out of the bladder. Some fragments remained in the bladder. At this point I removed the cystoscope. I dilated the urethra up to #28 [place] with Von Buren sounds. And a gyrus resectoscope was inserted in the bladder. Resection of the prostate gland was done between the 7 and the 10:00 position and between  the 2 and 5:00 positions using the bladder neck and the verumontanum as landmarks. Then the resection was completed between the 10 and 2:00 position and between the 5 and 7:00 position. Hemostasis was secured with electrocautery. The prostatic chips were then irrigated out of the bladder. The remaining stone fragments were removed with the resectoscope without difficulty. There was no evidence of remaining stone fragment in the bladder. At this point I noted a mucosal injury that appeared to extend through the wall of the bladder. I then injected contrast through the cystoscope for a cystogram. There was evidence of extravasation of contrast. A #22 Foley catheter was then inserted in the bladder and more contrast was injected in the bladder. There is a moderate amount of extravasation of contrast. At this point I decided to proceed with an exploration of the bladder and repair of the perforation.  I went to the waiting area and discussed with his wife and explained to her that I had to make an incision to repair the bladder.  I then returned to the operating room. The patient was then reprepped and redraped and placed in the supine position. I made a Pfannenstiel incision that I extended through the subcutaneous tissues. The rectus fascia was then incised. The patient had a previous hernia repair and there was some scar tissue in the midline. I was able to dissect and scar tissues and then I entered the Retzius space. The bladder was then filled with normal saline and there was some leakage of fluid on the anterior wall of the bladder. 2 stay sutures were placed on the anterior  wall of the bladder and the cystotomy was done. There was a linear perforation on the anterior wall of the bladder. I repaired the perforation in 2 layers with #3-0 and 2-0 Vicryl. I then explored the bladder and there was no evidence of remaining stone fragment in the bladder. The ureteral orifices are in normal position and shape. The  cystotomy was then closed in 2 layers with #3-0 and 2-0 Vicryl. The bladder was then again filled with normal saline. There was no evidence of extravasation of fluid at this point. Hemostasis was completed with electrocautery. A Blake drain was placed in the Retzius space and brought out through a separate stab wound. The fascia was then closed with #0 Vicryl. The subcutaneous tissues were approximated with #3-0 Vicryl. The skin was closed with #4-0 Monocryl using subcuticular sutures.  EBL: 100 cc  Needles, sponges count: Correct on 2 counts.  A #22 French Foley catheter was then left in the bladder.  The patient tolerated the procedure well and left the OR in satisfactory condition to postanesthesia care unit.

## 2013-06-23 NOTE — Progress Notes (Signed)
Afebrile.  V/S stable. Abdomen: Soft, non distended,.  Mild tenderness in the suprapubic area. Minimal Blake drainage. Foley draining well.  Urine pinkish.

## 2013-06-23 NOTE — Anesthesia Procedure Notes (Signed)
Procedure Name: LMA Insertion Date/Time: 06/23/2013 12:29 PM Performed by: Wanita Chamberlain Pre-anesthesia Checklist: Patient identified, Timeout performed, Emergency Drugs available, Suction available and Patient being monitored Patient Re-evaluated:Patient Re-evaluated prior to inductionOxygen Delivery Method: Circle system utilized Preoxygenation: Pre-oxygenation with 100% oxygen Intubation Type: IV induction Ventilation: Mask ventilation without difficulty LMA: LMA inserted LMA Size: 4.0 Number of attempts: 1 Airway Equipment and Method: Bite block Placement Confirmation: breath sounds checked- equal and bilateral and positive ETCO2 Tube secured with: Tape Dental Injury: Teeth and Oropharynx as per pre-operative assessment

## 2013-06-23 NOTE — H&P (Signed)
History of Present Illness Mr Aldana returns today for follow-up. He now feels better. But he still has slight dysuria. Renal ultrasound reveals possible small ureteral stones. Urinalysis shows 21-50 WBC's, 3-6 RBC's, few bacteria, moderate leukocyte esterase and 1 mgm urobilinogen.His PSA went down to 7.79 from 34.50. He needs cystoscopy for further evaluation.   Past Medical History Problems  1. History of hypercholesterolemia (V12.29) 2. History of hypertension (V12.59)  Surgical History Problems  1. History of Appendectomy 2. History of Inguinal Hernia Repair  Current Meds 1. Aspirin 81 MG Oral Tablet;  Therapy: (Recorded:28Jul2009) to Recorded 2. Carvedilol 12.5 MG Oral Tablet;  Therapy: (Recorded:28Jul2009) to Recorded 3. Hydrochlorothiazide 25 MG Oral Tablet;  Therapy: (Recorded:06Nov2014) to Recorded 4. Lovastatin 20 MG Oral Tablet;  Therapy: (Recorded:06Nov2014) to Recorded 5. Phenazopyridine HCl - 200 MG Oral Tablet; TAKE 1 TABLET 3 TIMES  DAILY AS NEEDED FOR PAIN;  Therapy: 24Oct2014 to (Evaluate:20Nov2014)  Requested for: [id number];  Last [phone] Ordered 6. Terazosin HCl - 10 MG Oral Capsule;  Therapy: (Recorded:16Sep2014) to Recorded  Allergies Medication  1. No Known Drug Allergies  Family History Problems  1. Family history of Family Health Status Number Of Children : Father   one son and rwo daugthers 2. Family history of Nephrolithiasis : Father  Social History Problems  1. Denied: History of Alcohol Use 2. Caffeine Use   Avg 10 per day 3. Former smoker [place])   smoked 2 ppd for 20 yrs & quit in 2007 4. Marital History - Currently Married 5. Occupation:   retired  Review of Systems Genitourinary, constitutional, skin, eye, otolaryngeal, hematologic/lymphatic, cardiovascular, pulmonary, endocrine, musculoskeletal, gastrointestinal, neurological and psychiatric system(s) were reviewed and pertinent findings if present are noted.   Genitourinary: urinary frequency and dysuria.    Vitals Vital Signs [Data Includes: Last 1 Day]  Recorded: [id number] 02:02PM  Blood Pressure: 143 / 84 Temperature: 97.6 F Heart Rate: 63  Physical Exam Constitutional: Well nourished and well developed . No acute distress.  ENT:. The ears and nose are normal in appearance.  Neck: The appearance of the neck is normal and no neck mass is present.  Pulmonary: No respiratory distress and normal respiratory rhythm and effort.  Cardiovascular: Heart rate and rhythm are normal . No peripheral edema.  Abdomen: The abdomen is soft and nontender. No masses are palpated. No CVA tenderness. No hernias are palpable. No hepatosplenomegaly noted.  Genitourinary: Examination of the penis demonstrates no discharge, no masses, no lesions and a normal meatus. The scrotum is without lesions. The right epididymis is palpably normal and non-tender. The left epididymis is palpably normal and non-tender. The right testis is non-tender and without masses. The left testis is non-tender and without masses.  Lymphatics: The femoral and inguinal nodes are not enlarged or tender.  Skin: Normal skin turgor, no visible rash and no visible skin lesions.  Neuro/Psych:. Mood and affect are appropriate.    Results/Data Urine [Data Includes: Last 1 Day]   [id number]  COLOR YELLOW   APPEARANCE CLEAR   SPECIFIC GRAVITY 1.015   pH 7.0   GLUCOSE NEG mg/dL  BILIRUBIN NEG   KETONE NEG mg/dL  BLOOD NEG   PROTEIN TRACE mg/dL  UROBILINOGEN 1 mg/dL  NITRITE NEG   LEUKOCYTE ESTERASE MOD   SQUAMOUS EPITHELIAL/HPF RARE   WBC 21-50 WBC/hpf  RBC 3-6 RBC/hpf  BACTERIA FEW   CRYSTALS NONE SEEN   CASTS NONE SEEN    RENAL ULTRASOUND  INDICATION: Chronic UTI  The right kidney measures  10.4 x 4.4 x 5.0 cm. There are several hyperechoic areas that could represent renal calculi.  The left kidney measures 9.6 x 3.4 x 3.7 cm. Multiple sub centimeters hyperechoic areas are also  noted in the left kidney..  ? large bladder stone.  PVR is 10 ml.   Procedure  Procedure: Cystoscopy   Indication: Frequent UTI.  Informed Consent: Risks, benefits, and potential adverse events were discussed and informed consent was obtained from the patient . Specific risks including, but not limited to bleeding, infection, pain, allergic reaction etc. were explained.  Prep: The patient was prepped with betadine.  Anesthesia:. Local anesthesia was administered intraurethrally with 2% lidocaine jelly.  Antibiotic prophylaxis: Ciprofloxacin.  Procedure Note:  Urethral meatus:. No abnormalities.  Anterior urethra: No abnormalities.  Prostatic urethra:. The lateral and median prostatic lobes were enlarged.  Bladder: Visulization was clear. The ureteral orifices were in the normal anatomic position bilaterally. A stone was present measuring approximately 2 cm in size. The patient tolerated the procedure well.  Complications: None.    Assessment Assessed  1. Bladder calculus (594.1) 2. Benign prostatic hyperplasia with urinary obstruction (600.01,599.69) 3. Elevated prostate specific antigen (PSA) (790.93)  Plan Bladder calculus  1. Follow-up Schedule Surgery Office  Follow-up  Status: Hold For -  Appointment  Requested for: [id number] Health Maintenance  2. UA With REFLEX; [Do Not Release]; Status:Complete;   Done: [id number]  01:00PM  Needs cystoscopy, TURP, cystolitholapaxy. The procedure, risks, benefits were explained to the patient. The risks include but are not limited to hemorrhage, infection, bladder injury, urinary incontinence. He understands and wishes to proceed.

## 2013-06-23 NOTE — Transfer of Care (Signed)
Immediate Anesthesia Transfer of Care Note  Patient: Justin Walters  Procedure(s) Performed: Procedure(s): TRANSURETHRAL RESECTION OF THE PROSTATE (N/A) CYSTOSCOPY WITH LITHOLAPAXY, BLADDER EXPLORATION, REPAIR BLADDER PERFORATION (N/A) HOLMIUM LASER APPLICATION (N/A)  Patient Location: PACU  Anesthesia Type:General  Level of Consciousness: awake, alert , oriented and patient cooperative  Airway & Oxygen Therapy: Patient Spontanous Breathing and Patient connected to face mask oxygen  Post-op Assessment: Report given to PACU RN and Post -op Vital signs reviewed and stable  Post vital signs: Reviewed and stable  Complications: No apparent anesthesia complications

## 2013-06-24 ENCOUNTER — Encounter (HOSPITAL_BASED_OUTPATIENT_CLINIC_OR_DEPARTMENT_OTHER): Payer: Self-pay | Admitting: Urology

## 2013-06-24 MED ORDER — OXYCODONE-ACETAMINOPHEN 5-325 MG PO TABS: 1.0000 | ORAL_TABLET | ORAL | Status: DC | PRN

## 2013-06-24 MED ORDER — OXYCODONE-ACETAMINOPHEN 5-325 MG PO TABS
ORAL_TABLET | ORAL | Status: AC
  Filled 2013-06-24: qty 1

## 2013-06-24 MED ORDER — OMEPRAZOLE MAGNESIUM 20 MG PO TBEC: 20.0000 mg | DELAYED_RELEASE_TABLET | Freq: Every day | ORAL | Status: DC

## 2013-06-24 NOTE — Anesthesia Postprocedure Evaluation (Signed)
  Anesthesia Post-op Note  Patient: Justin Walters  Procedure(s) Performed: Procedure(s) (LRB): TRANSURETHRAL RESECTION OF THE PROSTATE (N/A) CYSTOSCOPY WITH LITHOLAPAXY, BLADDER EXPLORATION, REPAIR BLADDER PERFORATION (N/A) HOLMIUM LASER APPLICATION (N/A)  Patient Location: PACU  Anesthesia Type: General  Level of Consciousness: awake and alert   Airway and Oxygen Therapy: Patient Spontanous Breathing  Post-op Pain: mild  Post-op Assessment: Post-op Vital signs reviewed, Patient's Cardiovascular Status Stable, Respiratory Function Stable, Patent Airway and No signs of Nausea or vomiting  Last Vitals:  Filed Vitals:   06/24/13 0550  BP: 105/72  Pulse: 67  Temp: 36.1 C  Resp: 18    Post-op Vital Signs: stable   Complications: No apparent anesthesia complications

## 2013-06-24 NOTE — Progress Notes (Signed)
Pt c/o severe heartburn after percocet tablet given at 0245.  Pt's HOB elevated, gingerale given and pt tolerating few saltines.  Pt c/o some nausea along with heartburn.

## 2013-06-24 NOTE — Progress Notes (Signed)
Patient and wife taught and return demonstrated prior to discharge how to empty measure and record drainage from JP drain,empty urinary catheter bag,and switch from large bag to leg bag. C.Foecking,RN

## 2013-06-24 NOTE — Progress Notes (Signed)
Pt ambulated in hallway approx 100 feet w/ 1 person asst.  Pt tolerated well.  Iv converted to NSL per order.  Encouraged po intake.  Pt c/o heatrburn and belching frequently, stating that heartburn was a "little better"

## 2013-06-26 NOTE — Addendum Note (Signed)
Addendum created 06/26/13 0719 by Montez Hageman, MD   Modules edited: Anesthesia Responsible Staff

## 2013-07-04 ENCOUNTER — Encounter (HOSPITAL_COMMUNITY): Payer: Self-pay | Admitting: Emergency Medicine

## 2013-07-04 ENCOUNTER — Emergency Department (HOSPITAL_COMMUNITY)
Admission: EM | Admit: 2013-07-04 | Discharge: 2013-07-04 | Disposition: A | Discharge status: Home / Self Care | Payer: Medicare HMO | Attending: Emergency Medicine | Admitting: Emergency Medicine

## 2013-07-04 DIAGNOSIS — Z87891 Personal history of nicotine dependence: Secondary | ICD-10-CM | POA: Insufficient documentation

## 2013-07-04 DIAGNOSIS — Z79899 Other long term (current) drug therapy: Secondary | ICD-10-CM | POA: Diagnosis not present

## 2013-07-04 DIAGNOSIS — E785 Hyperlipidemia, unspecified: Secondary | ICD-10-CM | POA: Insufficient documentation

## 2013-07-04 DIAGNOSIS — R339 Retention of urine, unspecified: Secondary | ICD-10-CM | POA: Diagnosis present

## 2013-07-04 DIAGNOSIS — N400 Enlarged prostate without lower urinary tract symptoms: Secondary | ICD-10-CM | POA: Diagnosis not present

## 2013-07-04 DIAGNOSIS — R3911 Hesitancy of micturition: Secondary | ICD-10-CM

## 2013-07-04 DIAGNOSIS — Z8719 Personal history of other diseases of the digestive system: Secondary | ICD-10-CM | POA: Diagnosis not present

## 2013-07-04 DIAGNOSIS — I10 Essential (primary) hypertension: Secondary | ICD-10-CM | POA: Insufficient documentation

## 2013-07-04 DIAGNOSIS — Z7982 Long term (current) use of aspirin: Secondary | ICD-10-CM | POA: Insufficient documentation

## 2013-07-04 DIAGNOSIS — Z87442 Personal history of urinary calculi: Secondary | ICD-10-CM | POA: Diagnosis not present

## 2013-07-04 DIAGNOSIS — Z9889 Other specified postprocedural states: Secondary | ICD-10-CM | POA: Insufficient documentation

## 2013-07-04 LAB — URINALYSIS, ROUTINE W REFLEX MICROSCOPIC
Bilirubin Urine: NEGATIVE
GLUCOSE, UA: NEGATIVE mg/dL
Ketones, ur: NEGATIVE mg/dL
Nitrite: NEGATIVE
Protein, ur: 30 mg/dL — AB
SPECIFIC GRAVITY, URINE: 1.016 (ref 1.005–1.030)
Urobilinogen, UA: 1 mg/dL (ref 0.0–1.0)
pH: 7.5 (ref 5.0–8.0)

## 2013-07-04 LAB — CBC WITH DIFFERENTIAL/PLATELET
Basophils Absolute: 0 10*3/uL (ref 0.0–0.1)
Basophils Relative: 1 % (ref 0–1)
EOS PCT: 6 % — AB (ref 0–5)
Eosinophils Absolute: 0.4 10*3/uL (ref 0.0–0.7)
HEMATOCRIT: 41.7 % (ref 39.0–52.0)
Hemoglobin: 14.5 g/dL (ref 13.0–17.0)
LYMPHS ABS: 0.9 10*3/uL (ref 0.7–4.0)
LYMPHS PCT: 13 % (ref 12–46)
MCH: 31 pg (ref 26.0–34.0)
MCHC: 34.8 g/dL (ref 30.0–36.0)
MCV: 89.1 fL (ref 78.0–100.0)
MONOS PCT: 9 % (ref 3–12)
Monocytes Absolute: 0.6 10*3/uL (ref 0.1–1.0)
Neutro Abs: 4.8 10*3/uL (ref 1.7–7.7)
Neutrophils Relative %: 73 % (ref 43–77)
Platelets: 268 10*3/uL (ref 150–400)
RBC: 4.68 MIL/uL (ref 4.22–5.81)
RDW: 13 % (ref 11.5–15.5)
WBC: 6.6 10*3/uL (ref 4.0–10.5)

## 2013-07-04 LAB — BASIC METABOLIC PANEL
Anion gap: 11 (ref 5–15)
BUN: 27 mg/dL — ABNORMAL HIGH (ref 6–23)
CALCIUM: 9.6 mg/dL (ref 8.4–10.5)
CO2: 33 mEq/L — ABNORMAL HIGH (ref 19–32)
Chloride: 94 mEq/L — ABNORMAL LOW (ref 96–112)
Creatinine, Ser: 1.53 mg/dL — ABNORMAL HIGH (ref 0.50–1.35)
GFR calc Af Amer: 51 mL/min — ABNORMAL LOW (ref >90)
GFR calc non Af Amer: 44 mL/min — ABNORMAL LOW (ref >90)
GLUCOSE: 115 mg/dL — AB (ref 70–99)
Potassium: 3.6 mEq/L — ABNORMAL LOW (ref 3.7–5.3)
Sodium: 138 mEq/L (ref 137–147)

## 2013-07-04 LAB — URINE MICROSCOPIC-ADD ON

## 2013-07-04 MED ORDER — SODIUM CHLORIDE 0.9 % IV BOLUS (SEPSIS)
1000.0000 mL | Freq: Once | INTRAVENOUS | Status: AC
Start: 2013-07-04 — End: 2013-07-04
  Administered 2013-07-04: 1000 mL via INTRAVENOUS

## 2013-07-04 NOTE — ED Notes (Signed)
Pt from home c/o inability to produce much urine today. Pt reports that he had TURP 11 days ago and foley removed yesterday. Since foley removal, pt has had urine come out "a teaspoon at a time." Pt is A&O and in NAD, states 0/10 pain. Wife at bedside

## 2013-07-04 NOTE — ED Notes (Signed)
Pt presents to ed with c/o urinary retention, pt sts was seen by Dr Kellie Simmering for kidney stones and had foley cath, which was removed yesterday. Pt sts unable to urinate since, only minimal dribbling. Denies pain

## 2013-07-04 NOTE — ED Notes (Addendum)
Bladder scan performed. Scan found 36 ml of urine in bladder. Dr. Tawnya Crook notified.

## 2013-07-04 NOTE — Discharge Instructions (Signed)
Acute Urinary Retention °Acute urinary retention is the temporary inability to urinate. °This is a common problem in older men. As men age their prostates become larger and block the flow of urine from the bladder. This is usually a problem that has come on gradually.  °HOME CARE INSTRUCTIONS °If you are sent home with a Foley catheter and a drainage system, you will need to discuss the best course of action with your health care provider. While the catheter is in, maintain a good intake of fluids. Keep the drainage bag emptied and lower than your catheter. This is so that contaminated urine will not flow back into your bladder, which could lead to a urinary tract infection. °There are two main types of drainage bags. One is a large bag that usually is used at night. It has a good capacity that will allow you to sleep through the night without having to empty it. The second type is called a leg bag. It has a smaller capacity, so it needs to be emptied more frequently. However, the main advantage is that it can be attached by a leg strap and can go underneath your clothing, allowing you the freedom to move about or leave your home. °Only take over-the-counter or prescription medicines for pain, discomfort, or fever as directed by your health care provider.  °SEEK MEDICAL CARE IF: °· You develop a low-grade fever. °· You experience spasms or leakage of urine with the spasms. °SEEK IMMEDIATE MEDICAL CARE IF:  °· You develop chills or fever. °· Your catheter stops draining urine. °· Your catheter falls out. °· You start to develop increased bleeding that does not respond to rest and increased fluid intake. °MAKE SURE YOU: °· Understand these instructions. °· Will watch your condition. °· Will get help right away if you are not doing well or get worse. °Document Released: 03/27/2000 Document Revised: 12/24/2012 Document Reviewed: 05/30/2012 °ExitCare® Patient Information ©2015 [employer]. This information is not  intended to replace advice given to you by your health care provider. Make sure you discuss any questions you have with your health care provider. ° °

## 2013-07-04 NOTE — ED Notes (Signed)
Pt given water per request and OK from Dr. Tawnya Crook

## 2013-07-04 NOTE — ED Notes (Signed)
Bed: MH68 Expected date:  Expected time:  Means of arrival:  Comments: EMS-hold for triage 2

## 2013-07-04 NOTE — ED Provider Notes (Signed)
CSN: [id number]     Arrival date & time 07/04/13  1607 History   First MD Initiated Contact with Patient 07/04/13 1624     Chief Complaint  Patient presents with  . Urinary Retention     (Consider location/radiation/quality/duration/timing/severity/associated sxs/prior Treatment) Patient is a 73 y.o. male presenting with male genitourinary complaint. The history is provided by the patient. No language interpreter was used.  Male GU Problem Presenting symptoms: no dysuria   Presenting symptoms comment:  Sensation of urinary retention and dec UOP for 1 day  Context comment:  Bladder surgery 6/22 with foley in place since yesterday Relieved by:  Nothing Worsened by:  Nothing tried Ineffective treatments:  None tried Associated symptoms: urinary hesitation and urinary retention   Associated symptoms: no abdominal pain, no diarrhea, no fever, no flank pain, no nausea, no urinary incontinence and no vomiting     Past Medical History  Diagnosis Date  . Hypertension   . Hyperlipidemia   . Diverticulosis of colon   . BPH (benign prostatic hyperplasia)   . Bladder stone   . Dysuria   . History of kidney stones   . Glucose intolerance (impaired glucose tolerance)   . At risk for sleep apnea     STOP-BANG= 5    SENT TO PCP 06-13-2013   Past Surgical History  Procedure Laterality Date  . Cataract extraction w/ intraocular lens  implant, bilateral    . Laparoscopic inguinal hernia repair Bilateral 10-09-2003  . Appendectomy  1980's  . Transurethral resection of prostate N/A 06/23/2013    Procedure: TRANSURETHRAL RESECTION OF THE PROSTATE;  Surgeon: Arvil Persons, MD;  Location: [facility];  Service: Urology;  Laterality: N/A;  . Cystoscopy N/A 06/23/2013    Procedure: CYSTOSCOPY WITH LITHOLAPAXY, BLADDER EXPLORATION, REPAIR BLADDER PERFORATION;  Surgeon: Arvil Persons, MD;  Location: [place];  Service: Urology;  Laterality: N/A;  . Holmium laser application  N/A 4/68/0321    Procedure: HOLMIUM LASER APPLICATION;  Surgeon: Arvil Persons, MD;  Location: [facility];  Service: Urology;  Laterality: N/A;   Family History  Problem Relation Age of Onset  . COPD Mother    History  Substance Use Topics  . Smoking status: Former Smoker -- 3.00 packs/day for 34 years    Types: Cigarettes    Quit date: 01/02/1993  . Smokeless tobacco: Never Used  . Alcohol Use: 2.0 oz/week    4 drink(s) per week    Review of Systems  Constitutional: Negative for fever, activity change, appetite change and fatigue.  HENT: Negative for congestion, facial swelling, rhinorrhea and trouble swallowing.   Eyes: Negative for photophobia and pain.  Respiratory: Negative for cough, chest tightness and shortness of breath.   Cardiovascular: Negative for chest pain and leg swelling.  Gastrointestinal: Negative for nausea, vomiting, abdominal pain, diarrhea and constipation.  Endocrine: Negative for polydipsia and polyuria.  Genitourinary: Positive for hesitancy, decreased urine volume and difficulty urinating. Negative for bladder incontinence, dysuria, urgency and flank pain.  Musculoskeletal: Negative for back pain and gait problem.  Skin: Negative for color change, rash and wound.  Allergic/Immunologic: Negative for immunocompromised state.  Neurological: Negative for dizziness, facial asymmetry, speech difficulty, weakness, numbness and headaches.  Psychiatric/Behavioral: Negative for confusion, decreased concentration and agitation.      Allergies  Review of patient's allergies indicates no known allergies.  Home Medications   Prior to Admission medications   Medication Sig Start Date End Date Taking? Authorizing  Provider  aspirin 81 MG EC tablet Take 81 mg by mouth daily. Swallow whole.   Yes Biagio Borg, MD  carvedilol (COREG) 12.5 MG tablet Take 12.5 mg by mouth 2 (two) times daily with a meal.   Yes Historical Provider, MD  hydrochlorothiazide  (HYDRODIURIL) 25 MG tablet Take 25 mg by mouth daily.   Yes Historical Provider, MD  lovastatin (MEVACOR) 20 MG tablet Take 2 tablets (40 mg total) by mouth at bedtime. 05/01/13  Yes Biagio Borg, MD  omeprazole (PRILOSEC OTC) 20 MG tablet Take 1 tablet (20 mg total) by mouth daily. 06/24/13  Yes Arvil Persons, MD  oxyCODONE-acetaminophen (ROXICET) 5-325 MG per tablet Take 1 tablet by mouth every 4 (four) hours as needed for severe pain. 06/24/13  Yes Arvil Persons, MD  terazosin (HYTRIN) 10 MG capsule Take 10 mg by mouth at bedtime.   Yes Historical Provider, MD   BP 125/83  Pulse 65  Temp(Src) 98.4 F (36.9 C) (Oral)  Resp 20  SpO2 98% Physical Exam  Constitutional: He is oriented to person, place, and time. He appears well-developed and well-nourished. No distress.  HENT:  Head: Normocephalic and atraumatic.  Mouth/Throat: No oropharyngeal exudate.  Eyes: Pupils are equal, round, and reactive to light.  Neck: Normal range of motion. Neck supple.  Cardiovascular: Normal rate, regular rhythm and normal heart sounds.  Exam reveals no gallop and no friction rub.   No murmur heard. Pulmonary/Chest: Effort normal and breath sounds normal. No respiratory distress. He has no wheezes. He has no rales.  Abdominal: Soft. Bowel sounds are normal. He exhibits no distension and no mass. There is no tenderness. There is no rigidity, no rebound and no guarding.  Genitourinary:     Musculoskeletal: Normal range of motion. He exhibits no edema and no tenderness.  Neurological: He is alert and oriented to person, place, and time.  Skin: Skin is warm and dry.  Psychiatric: He has a normal mood and affect.    ED Course  Procedures (including critical care time) Labs Review Labs Reviewed  URINALYSIS, ROUTINE W REFLEX MICROSCOPIC - Abnormal; Notable for the following:    APPearance CLOUDY (*)    Hgb urine dipstick MODERATE (*)    Protein, ur 30 (*)    Leukocytes, UA LARGE (*)    All other components  within normal limits  CBC WITH DIFFERENTIAL - Abnormal; Notable for the following:    Eosinophils Relative 6 (*)    All other components within normal limits  BASIC METABOLIC PANEL - Abnormal; Notable for the following:    Potassium 3.6 (*)    Chloride 94 (*)    CO2 33 (*)    Glucose, Bld 115 (*)    BUN 27 (*)    Creatinine, Ser 1.53 (*)    GFR calc non Af Amer 44 (*)    GFR calc Af Amer 51 (*)    All other components within normal limits  URINE MICROSCOPIC-ADD ON    Imaging Review No results found.   EKG Interpretation None      MDM   Final diagnoses:  Urinary hesitancy    Pt is a 73 y.o. male with Pmhx as above who presents with sensation of urinary retention and dec UOP since having foley removed yesterday. Foley placed 10d ago after TURP with cystoscopy with litholapaxy, repair of bladder perforation on 6/22. He denies fever, says he has had sensation of bladder pressure, but no abdominal pain, fever, chills,  n/v.  On PE, VSS, pt in NAD. He has mild tenderness over suprapubic incision site which is c/d/i.  Pt had only 19-40cc seen on multiple bladder scans.  Will check Cr, give IVF and PO challenge and see if pt able to urinate or if he truly retaining.    Cr stable. U/A w/ leukocytes which are liekly reative. Culture wil be sent. Pt has tolerated PO, has urinated w/o difficulty and has 39cc on repeat bladder scan. Will d/c home w/o foley, but w/ return precautions for new or worsening symptoms including dec UOP, fever. Rec inc PO fluid intake.      Neta Ehlers, MD 07/04/13 [phone]

## 2013-07-04 NOTE — ED Notes (Addendum)
Performed bladder scan. Found between 19-40 ml of urine in bladder. Patient was tender to touch to the bladder scanner. Doctor Tawnya Crook made aware.

## 2013-07-31 ENCOUNTER — Encounter: Payer: Self-pay | Admitting: Gastroenterology

## 2013-08-04 ENCOUNTER — Emergency Department (HOSPITAL_COMMUNITY)
Admission: EM | Admit: 2013-08-04 | Discharge: 2013-08-04 | Disposition: A | Discharge status: Home / Self Care | Payer: Medicare HMO | Attending: Emergency Medicine | Admitting: Emergency Medicine

## 2013-08-04 ENCOUNTER — Encounter (HOSPITAL_COMMUNITY): Payer: Self-pay | Admitting: Emergency Medicine

## 2013-08-04 DIAGNOSIS — I10 Essential (primary) hypertension: Secondary | ICD-10-CM | POA: Diagnosis not present

## 2013-08-04 DIAGNOSIS — R339 Retention of urine, unspecified: Secondary | ICD-10-CM | POA: Diagnosis present

## 2013-08-04 DIAGNOSIS — Z862 Personal history of diseases of the blood and blood-forming organs and certain disorders involving the immune mechanism: Secondary | ICD-10-CM | POA: Insufficient documentation

## 2013-08-04 DIAGNOSIS — Z7982 Long term (current) use of aspirin: Secondary | ICD-10-CM | POA: Diagnosis not present

## 2013-08-04 DIAGNOSIS — Z9089 Acquired absence of other organs: Secondary | ICD-10-CM | POA: Insufficient documentation

## 2013-08-04 DIAGNOSIS — Z8719 Personal history of other diseases of the digestive system: Secondary | ICD-10-CM | POA: Diagnosis not present

## 2013-08-04 DIAGNOSIS — N390 Urinary tract infection, site not specified: Secondary | ICD-10-CM

## 2013-08-04 DIAGNOSIS — Z79899 Other long term (current) drug therapy: Secondary | ICD-10-CM | POA: Insufficient documentation

## 2013-08-04 DIAGNOSIS — Z8639 Personal history of other endocrine, nutritional and metabolic disease: Secondary | ICD-10-CM | POA: Insufficient documentation

## 2013-08-04 DIAGNOSIS — Z87448 Personal history of other diseases of urinary system: Secondary | ICD-10-CM | POA: Diagnosis not present

## 2013-08-04 DIAGNOSIS — R319 Hematuria, unspecified: Secondary | ICD-10-CM | POA: Insufficient documentation

## 2013-08-04 DIAGNOSIS — Z9889 Other specified postprocedural states: Secondary | ICD-10-CM | POA: Diagnosis not present

## 2013-08-04 DIAGNOSIS — Z87891 Personal history of nicotine dependence: Secondary | ICD-10-CM | POA: Diagnosis not present

## 2013-08-04 LAB — BASIC METABOLIC PANEL
Anion gap: 14 (ref 5–15)
BUN: 32 mg/dL — AB (ref 6–23)
CALCIUM: 9.6 mg/dL (ref 8.4–10.5)
CO2: 28 mEq/L (ref 19–32)
Chloride: 94 mEq/L — ABNORMAL LOW (ref 96–112)
Creatinine, Ser: 1.61 mg/dL — ABNORMAL HIGH (ref 0.50–1.35)
GFR, EST AFRICAN AMERICAN: 48 mL/min — AB (ref >90)
GFR, EST NON AFRICAN AMERICAN: 41 mL/min — AB (ref >90)
Glucose, Bld: 112 mg/dL — ABNORMAL HIGH (ref 70–99)
Potassium: 3.5 mEq/L — ABNORMAL LOW (ref 3.7–5.3)
Sodium: 136 mEq/L — ABNORMAL LOW (ref 137–147)

## 2013-08-04 LAB — CBC WITH DIFFERENTIAL/PLATELET
BASOS PCT: 1 % (ref 0–1)
Basophils Absolute: 0 10*3/uL (ref 0.0–0.1)
EOS ABS: 0.3 10*3/uL (ref 0.0–0.7)
EOS PCT: 6 % — AB (ref 0–5)
HEMATOCRIT: 39.5 % (ref 39.0–52.0)
HEMOGLOBIN: 13.5 g/dL (ref 13.0–17.0)
Lymphocytes Relative: 19 % (ref 12–46)
Lymphs Abs: 1.1 10*3/uL (ref 0.7–4.0)
MCH: 30.5 pg (ref 26.0–34.0)
MCHC: 34.2 g/dL (ref 30.0–36.0)
MCV: 89.2 fL (ref 78.0–100.0)
MONO ABS: 0.6 10*3/uL (ref 0.1–1.0)
MONOS PCT: 11 % (ref 3–12)
Neutro Abs: 3.6 10*3/uL (ref 1.7–7.7)
Neutrophils Relative %: 63 % (ref 43–77)
Platelets: 163 10*3/uL (ref 150–400)
RBC: 4.43 MIL/uL (ref 4.22–5.81)
RDW: 12.9 % (ref 11.5–15.5)
WBC: 5.7 10*3/uL (ref 4.0–10.5)

## 2013-08-04 LAB — URINE MICROSCOPIC-ADD ON

## 2013-08-04 LAB — URINALYSIS, ROUTINE W REFLEX MICROSCOPIC
Bilirubin Urine: NEGATIVE
Glucose, UA: NEGATIVE mg/dL
Ketones, ur: NEGATIVE mg/dL
Nitrite: POSITIVE — AB
Protein, ur: 30 mg/dL — AB
SPECIFIC GRAVITY, URINE: 1.013 (ref 1.005–1.030)
Urobilinogen, UA: 1 mg/dL (ref 0.0–1.0)
pH: 6 (ref 5.0–8.0)

## 2013-08-04 MED ORDER — SODIUM CHLORIDE 0.9 % IV BOLUS (SEPSIS)
500.0000 mL | Freq: Once | INTRAVENOUS | Status: AC
  Administered 2013-08-04: 500 mL via INTRAVENOUS

## 2013-08-04 MED ORDER — LIDOCAINE HCL 2 % EX GEL
1.0000 "application " | Freq: Once | CUTANEOUS | Status: AC
  Administered 2013-08-04: 1 via URETHRAL

## 2013-08-04 MED ORDER — SODIUM CHLORIDE 0.9 % IV SOLN
Freq: Once | INTRAVENOUS | Status: AC
  Administered 2013-08-04: 08:00:00 via INTRAVENOUS

## 2013-08-04 MED ORDER — MORPHINE SULFATE 4 MG/ML IJ SOLN
4.0000 mg | Freq: Once | INTRAMUSCULAR | Status: AC
  Administered 2013-08-04: 4 mg via INTRAVENOUS
  Filled 2013-08-04: qty 1

## 2013-08-04 MED ORDER — DEXTROSE 5 % IV SOLN
1.0000 g | Freq: Once | INTRAVENOUS | Status: AC
  Administered 2013-08-04: 1 g via INTRAVENOUS
  Filled 2013-08-04: qty 10

## 2013-08-04 MED ORDER — OXYCODONE-ACETAMINOPHEN 5-325 MG PO TABS: 1.0000 | ORAL_TABLET | Freq: Four times a day (QID) | ORAL | Status: DC | PRN

## 2013-08-04 MED ORDER — LIDOCAINE HCL 2 % EX GEL
CUTANEOUS | Status: AC
  Administered 2013-08-04: 1 via URETHRAL
  Filled 2013-08-04: qty 10

## 2013-08-04 NOTE — ED Provider Notes (Signed)
7:35 AM Patient seen by Clemens Catholic, NP.  I have also been following that patient as Justin Walters is still orienting to her new NP position.  Pt with hx BPH, kidney stones, bladder stone with surgery 6/22 cystoscopy, cystlithopaxy, repaired bladder perforation by Dr Janice Norrie.  Has had steadily decreasing stream, with frequency and urgency, now with pain.  Bladder scan after voiding small amount, 361cc.  UA shows UTI.  Nurse unable to pass foley as it feels there is obstruction.  I spoke with Dr Junious Silk who is on call for urology.  He agrees with IV Rocephin in ED and will see the patient in the office this morning.  Pt to call office at 8:30am. Will defer antibiotic prescription to him - he agrees with this.  Please see notes by Dr Mingo Amber and NP Tysinger for further details.    Results for orders placed during the hospital encounter of 08/04/13  URINALYSIS, ROUTINE W REFLEX MICROSCOPIC      Result Value Ref Range   Color, Urine ORANGE (*) YELLOW   APPearance CLOUDY (*) CLEAR   Specific Gravity, Urine 1.013  1.005 - 1.030   pH 6.0  5.0 - 8.0   Glucose, UA NEGATIVE  NEGATIVE mg/dL   Hgb urine dipstick MODERATE (*) NEGATIVE   Bilirubin Urine NEGATIVE  NEGATIVE   Ketones, ur NEGATIVE  NEGATIVE mg/dL   Protein, ur 30 (*) NEGATIVE mg/dL   Urobilinogen, UA 1.0  0.0 - 1.0 mg/dL   Nitrite POSITIVE (*) NEGATIVE   Leukocytes, UA LARGE (*) NEGATIVE  CBC WITH DIFFERENTIAL      Result Value Ref Range   WBC 5.7  4.0 - 10.5 K/uL   RBC 4.43  4.22 - 5.81 MIL/uL   Hemoglobin 13.5  13.0 - 17.0 g/dL   HCT 39.5  39.0 - 52.0 %   MCV 89.2  78.0 - 100.0 fL   MCH 30.5  26.0 - 34.0 pg   MCHC 34.2  30.0 - 36.0 g/dL   RDW 12.9  11.5 - 15.5 %   Platelets 163  150 - 400 K/uL   Neutrophils Relative % 63  43 - 77 %   Neutro Abs 3.6  1.7 - 7.7 K/uL   Lymphocytes Relative 19  12 - 46 %   Lymphs Abs 1.1  0.7 - 4.0 K/uL   Monocytes Relative 11  3 - 12 %   Monocytes Absolute 0.6  0.1 - 1.0 K/uL   Eosinophils Relative 6 (*) 0  - 5 %   Eosinophils Absolute 0.3  0.0 - 0.7 K/uL   Basophils Relative 1  0 - 1 %   Basophils Absolute 0.0  0.0 - 0.1 K/uL  BASIC METABOLIC PANEL      Result Value Ref Range   Sodium 136 (*) 137 - 147 mEq/L   Potassium 3.5 (*) 3.7 - 5.3 mEq/L   Chloride 94 (*) 96 - 112 mEq/L   CO2 28  19 - 32 mEq/L   Glucose, Bld 112 (*) 70 - 99 mg/dL   BUN 32 (*) 6 - 23 mg/dL   Creatinine, Ser 1.61 (*) 0.50 - 1.35 mg/dL   Calcium 9.6  8.4 - 10.5 mg/dL   GFR calc non Af Amer 41 (*) >90 mL/min   GFR calc Af Amer 48 (*) >90 mL/min   Anion gap 14  5 - 15  URINE MICROSCOPIC-ADD ON      Result Value Ref Range   Squamous Epithelial / LPF RARE  RARE  WBC, UA 7-10  <3 WBC/hpf   RBC / HPF 7-10  <3 RBC/hpf   Bacteria, UA FEW (*) RARE   No results found.    [place], PA-C 08/04/13 [phone]

## 2013-08-04 NOTE — ED Notes (Signed)
Pt alert and oriented x4. Respirations even and unlabored, bilateral symmetrical rise and fall of chest. Skin warm and dry. In no acute distress. Denies needs.   

## 2013-08-04 NOTE — ED Provider Notes (Signed)
CSN: [id number]     Arrival date & time 08/04/13  0606 History   First MD Initiated Contact with Patient 08/04/13 [phone]     Chief Complaint  Patient presents with  . Urinary Retention   HPI Pt is 73 yo white male who presents with pain in penis and difficulty urinating, worsening last night at 6pm, including continual urine leakage requiring pt to use a paper towel to absorb urine. He had a cystoscopy, cystolitholapaxy, exploration of bladder, and repair of bladder perforation on 6/22.  Since then he been having difficulty urinating including urgency, frequency, but only minimal urine output at each void since his surgery.  He was seen by his urologist (Dr. Janice Norrie) last week for similar symptoms, no infection was noted.  Pt reports 2/10 pain in his penis currently, but 8/10 penis pain when urinating.  He took Pyridium last night with minimal relief. He denies fever, chills, abd pain, nausea, vomiting, testicular swelling or pain.      Past Medical History  Diagnosis Date  . Hypertension   . Hyperlipidemia   . Diverticulosis of colon   . BPH (benign prostatic hyperplasia)   . Bladder stone   . Dysuria   . History of kidney stones   . Glucose intolerance (impaired glucose tolerance)   . At risk for sleep apnea     STOP-BANG= 5    SENT TO PCP 06-13-2013   Past Surgical History  Procedure Laterality Date  . Cataract extraction w/ intraocular lens  implant, bilateral    . Laparoscopic inguinal hernia repair Bilateral 10-09-2003  . Appendectomy  1980's  . Transurethral resection of prostate N/A 06/23/2013    Procedure: TRANSURETHRAL RESECTION OF THE PROSTATE;  Surgeon: Arvil Persons, MD;  Location: [facility];  Service: Urology;  Laterality: N/A;  . Cystoscopy N/A 06/23/2013    Procedure: CYSTOSCOPY WITH LITHOLAPAXY, BLADDER EXPLORATION, REPAIR BLADDER PERFORATION;  Surgeon: Arvil Persons, MD;  Location: [place];  Service: Urology;  Laterality: N/A;  . Holmium laser  application N/A 8/36/6294    Procedure: HOLMIUM LASER APPLICATION;  Surgeon: Arvil Persons, MD;  Location: [facility];  Service: Urology;  Laterality: N/A;   Family History  Problem Relation Age of Onset  . COPD Mother    History  Substance Use Topics  . Smoking status: Former Smoker -- 3.00 packs/day for 34 years    Types: Cigarettes    Quit date: 01/02/1993  . Smokeless tobacco: Never Used  . Alcohol Use: 2.0 oz/week    4 drink(s) per week    Review of Systems  Constitutional: Negative for fever, chills, diaphoresis, appetite change and fatigue.  Respiratory: Negative for cough and shortness of breath.   Gastrointestinal: Negative for nausea, vomiting, abdominal pain, diarrhea, constipation and abdominal distention.  Genitourinary: Positive for dysuria, urgency, frequency, decreased urine volume, difficulty urinating and penile pain. Negative for hematuria, flank pain, discharge, penile swelling, scrotal swelling and testicular pain.  Skin: Negative for color change.  All other systems reviewed and are negative.     Allergies  Review of patient's allergies indicates no known allergies.  Home Medications   Prior to Admission medications   Medication Sig Start Date End Date Taking? Authorizing Provider  aspirin 81 MG EC tablet Take 81 mg by mouth daily. Swallow whole.    Biagio Borg, MD  carvedilol (COREG) 12.5 MG tablet Take 12.5 mg by mouth 2 (two) times daily with a meal.  Historical Provider, MD  hydrochlorothiazide (HYDRODIURIL) 25 MG tablet Take 25 mg by mouth daily.    Historical Provider, MD  lovastatin (MEVACOR) 20 MG tablet Take 2 tablets (40 mg total) by mouth at bedtime. 05/01/13   Biagio Borg, MD  omeprazole (PRILOSEC OTC) 20 MG tablet Take 1 tablet (20 mg total) by mouth daily. 06/24/13   Arvil Persons, MD  oxyCODONE-acetaminophen (ROXICET) 5-325 MG per tablet Take 1 tablet by mouth every 4 (four) hours as needed for severe pain. 06/24/13   Arvil Persons, MD  terazosin (HYTRIN) 10 MG capsule Take 10 mg by mouth at bedtime.    Historical Provider, MD   BP 117/69  Pulse 76  Temp(Src) 97.7 F (36.5 C) (Oral)  Resp 18  Ht 5\' 7"  (1.702 m)  Wt 150 lb (68.04 kg)  BMI 23.49 kg/m2  SpO2 96% Physical Exam  Nursing note and vitals reviewed. Constitutional: He is oriented to person, place, and time. Vital signs are normal. He appears well-developed and well-nourished. No distress.  HENT:  Head: Normocephalic and atraumatic.  Eyes: Conjunctivae and EOM are normal. Pupils are equal, round, and reactive to light. No scleral icterus.  Neck: Normal range of motion. Neck supple.  Cardiovascular: Normal rate and intact distal pulses.   Pulmonary/Chest: Effort normal and breath sounds normal.  Abdominal: Soft. Normal appearance and bowel sounds are normal. He exhibits no distension, no ascites and no mass. There is no hepatosplenomegaly. There is no tenderness. There is no rigidity, no rebound, no guarding and no CVA tenderness.    Genitourinary: Testes normal and penis normal.  Musculoskeletal: Normal range of motion.  Neurological: He is alert and oriented to person, place, and time.  Skin: Skin is warm and dry. No rash noted. He is not diaphoretic.  Psychiatric: He has a normal mood and affect. His behavior is normal. Judgment and thought content normal.    ED Course  Procedures (including critical care time) Labs Review Labs Reviewed  URINALYSIS, ROUTINE W REFLEX MICROSCOPIC - Abnormal; Notable for the following:    Color, Urine ORANGE (*)    APPearance CLOUDY (*)    Hgb urine dipstick MODERATE (*)    Protein, ur 30 (*)    Nitrite POSITIVE (*)    Leukocytes, UA LARGE (*)    All other components within normal limits  CBC WITH DIFFERENTIAL - Abnormal; Notable for the following:    Eosinophils Relative 6 (*)    All other components within normal limits  BASIC METABOLIC PANEL - Abnormal; Notable for the following:    Sodium 136 (*)      Potassium 3.5 (*)    Chloride 94 (*)    Glucose, Bld 112 (*)    BUN 32 (*)    Creatinine, Ser 1.61 (*)    GFR calc non Af Amer 41 (*)    GFR calc Af Amer 48 (*)    All other components within normal limits  URINE MICROSCOPIC-ADD ON - Abnormal; Notable for the following:    Bacteria, UA FEW (*)    All other components within normal limits  URINE CULTURE   7:09 AM Discussed with Dr. Mingo Amber  7:33 AM RN reports difficulty passing urinary cathter, Dr. Mingo Amber evaluated.  Urology consulted.   Imaging Review No results found.   EKG Interpretation None      MDM   Final diagnoses:  Urinary tract infection with hematuria, site unspecified  Urinary retention   Pt presents with dysuria and  urinary retention onset last night at 6pm.  He had been experiencing urinary frequency, urgency, and progressive decrease in urine stream since his cystoscopy, cystolitholapaxy, exploration of bladder, and repair of bladder perforation on 6/22.  While in ED, he was able to collect enough urine for UA from urinary leakage but has not able to empty his bladder.  A bladder scan revealed 333ml.  A foley was ordered, IV NS 544ml bolus and morphine given and CBC, BMP drawn.  The UA showed showed positive leukocytes and nitrites.  A urine culture was added and Rocephin IV was ordered.  Nursing staff reported difficulty passing catheter despite attempts with various sizes.  The pt's case had been discussed with Dr. Mingo Amber, who evaluated the pt and urology was consulted.  Pt was alert, without distress and appeared safe for discharge.  He was given a prescription for percocet and after he called the office and was instructed to be come over to the urology office, he was discharged with return precautions.     Britt Bottom, NP 08/04/13 [phone]

## 2013-08-04 NOTE — ED Notes (Signed)
Pt c/o urinary retention since yesterday, c/o penile pain when he attempts to urine, only dribbling, pt is taking Pyridium

## 2013-08-04 NOTE — ED Provider Notes (Signed)
Medical screening examination/treatment/procedure(s) were conducted as a shared visit with non-physician practitioner(s) and myself.  I personally evaluated the patient during the encounter.   EKG Interpretation None      Patient here with difficulty urinating. Unable to pass Foley here. Dr. Junious Silk in the Urology clinic will see him in the office. Unable to feel any stones in the urethral meatus here.  Osvaldo Shipper, MD 08/04/13 1540

## 2013-08-04 NOTE — Discharge Instructions (Signed)
Please read the included instructions.  It is important to follow-up with the urology doctor for evaluation of this pain and infection.  Do not hesitate to return to the emergency department for any worsening pain, fever, chills, vomiting, or abdominal pain. Return also if you develop chest pain, difficulty breathing, dizziness or fainting, or become confused, poorly responsive.

## 2013-08-04 NOTE — ED Notes (Signed)
Dub Mikes, rn unable to insert foley catheter, MD and PA to assess and now will consult urology.

## 2013-08-04 NOTE — ED Notes (Signed)
Pt escorted to discharge window. Pt verbalized understanding discharge instructions. In no acute distress.  

## 2013-08-05 LAB — URINE CULTURE
COLONY COUNT: NO GROWTH
Culture: NO GROWTH

## 2013-08-05 NOTE — ED Provider Notes (Signed)
Medical screening examination/treatment/procedure(s) were conducted as a shared visit with non-physician practitioner(s) and myself.  I personally evaluated the patient during the encounter.   EKG Interpretation None      See my previous note  Evelina Bucy, MD 08/05/13 1941

## 2013-10-02 ENCOUNTER — Encounter: Payer: Self-pay | Admitting: Gastroenterology

## 2013-11-20 ENCOUNTER — Ambulatory Visit (AMBULATORY_SURGERY_CENTER): Payer: Self-pay | Admitting: *Deleted

## 2013-11-20 VITALS — Ht 67.0 in | Wt 156.8 lb

## 2013-11-20 DIAGNOSIS — Z1211 Encounter for screening for malignant neoplasm of colon: Secondary | ICD-10-CM

## 2013-11-20 MED ORDER — NA SULFATE-K SULFATE-MG SULF 17.5-3.13-1.6 GM/177ML PO SOLN: 1.0000 | Freq: Once | ORAL | Status: DC

## 2013-11-20 NOTE — Progress Notes (Signed)
No egg or soy allergy. ewm No problems with past sedation. Did have severe reflux after his last bladder surgery. ewm No blood thinners, no diet pills. ewm No home 02 use. ewm No e mail for emmi. ewm

## 2013-12-04 ENCOUNTER — Ambulatory Visit (AMBULATORY_SURGERY_CENTER): Payer: Commercial Managed Care - HMO | Admitting: Gastroenterology

## 2013-12-04 ENCOUNTER — Encounter: Payer: Self-pay | Admitting: Gastroenterology

## 2013-12-04 VITALS — BP 117/75 | HR 66 | Temp 97.9°F | Resp 16 | Ht 67.0 in | Wt 156.0 lb

## 2013-12-04 DIAGNOSIS — K5730 Diverticulosis of large intestine without perforation or abscess without bleeding: Secondary | ICD-10-CM

## 2013-12-04 DIAGNOSIS — Z1211 Encounter for screening for malignant neoplasm of colon: Secondary | ICD-10-CM

## 2013-12-04 MED ORDER — SODIUM CHLORIDE 0.9 % IV SOLN: 500.0000 mL | INTRAVENOUS | Status: DC

## 2013-12-04 NOTE — Patient Instructions (Signed)

## 2013-12-04 NOTE — Op Note (Signed)
[facility]  [address] [place], [address]   COLONOSCOPY PROCEDURE REPORT  PATIENT: Justin Walters, Justin Walters  MR#: [id number] BIRTHDATE: 1940/12/29 , 73  yrs. old GENDER: male ENDOSCOPIST: Inda Castle, MD REFERRED BY: PROCEDURE DATE:  12/04/2013 PROCEDURE:   Colonoscopy, diagnostic First Screening Colonoscopy - Avg.  risk and is 50 yrs.  old or older - No.  Prior Negative Screening - Now for repeat screening. 10 or more years since last screening  History of Adenoma - Now for follow-up colonoscopy & has been > or = to 3 yrs.  N/A  Polyps Removed Today? No.  Recommend repeat exam, <10 yrs? No. ASA CLASS:   Class II INDICATIONS:average risk for colon cancer. MEDICATIONS: Monitored anesthesia care and Propofol 150 mg IV  DESCRIPTION OF PROCEDURE:   After the risks benefits and alternatives of the procedure were thoroughly explained, informed consent was obtained.  The digital rectal exam revealed no abnormalities of the rectum.   The LB [id number]  endoscope was introduced through the anus and advanced to the cecum, which was identified by both the appendix and ileocecal valve. No adverse events experienced.   The quality of the prep was excellent using Suprep  The instrument was then slowly withdrawn as the colon was fully examined.      COLON FINDINGS: There was moderate diverticulosis noted in the descending colon and sigmoid colon.   The examination was otherwise normal.  Retroflexed views revealed no abnormalities. The time to cecum=3 minutes 01 seconds.  Withdrawal time=6 minutes 10 seconds. The scope was withdrawn and the procedure completed. COMPLICATIONS: There were no immediate complications.  ENDOSCOPIC IMPRESSION: 1.   Moderate diverticulosis was noted in the descending colon and sigmoid colon 2.   The examination was otherwise normal  RECOMMENDATIONS: Given your age, you will not need another colonoscopy for colon cancer screening or polyp  surveillance.  These types of tests usually stop around the age 68.  eSigned:  Inda Castle, MD 12/04/2013 9:20 AM   cc: Biagio Borg, MD

## 2013-12-04 NOTE — Progress Notes (Signed)
Patient awakening,vss,report to rn 

## 2013-12-05 ENCOUNTER — Telehealth: Payer: Self-pay

## 2013-12-05 NOTE — Telephone Encounter (Signed)
Left a message at [phone] for the pt to call us back if any questions or concerns. maw

## 2013-12-25 ENCOUNTER — Telehealth: Payer: Self-pay | Admitting: Internal Medicine

## 2013-12-25 NOTE — Telephone Encounter (Signed)
Referral # [id number] start date 12/29/13 exp 06/27/14 good for 6 visits

## 2013-12-25 NOTE — Telephone Encounter (Signed)
Needs [employer] referral to Dr. Larwance Sachs with [facility].  Patient has appointment Monday 12/30/2014

## 2014-03-03 DIAGNOSIS — N359 Urethral stricture, unspecified: Secondary | ICD-10-CM | POA: Diagnosis not present

## 2014-03-03 DIAGNOSIS — R339 Retention of urine, unspecified: Secondary | ICD-10-CM | POA: Diagnosis not present

## 2014-05-15 ENCOUNTER — Other Ambulatory Visit (INDEPENDENT_AMBULATORY_CARE_PROVIDER_SITE_OTHER): Payer: Commercial Managed Care - HMO

## 2014-05-15 ENCOUNTER — Encounter: Payer: Self-pay | Admitting: Internal Medicine

## 2014-05-15 ENCOUNTER — Ambulatory Visit (INDEPENDENT_AMBULATORY_CARE_PROVIDER_SITE_OTHER): Payer: Commercial Managed Care - HMO | Admitting: Internal Medicine

## 2014-05-15 VITALS — BP 106/70 | HR 80 | Temp 97.7°F | Ht 67.0 in | Wt 160.5 lb

## 2014-05-15 DIAGNOSIS — R7302 Impaired glucose tolerance (oral): Secondary | ICD-10-CM

## 2014-05-15 DIAGNOSIS — Z125 Encounter for screening for malignant neoplasm of prostate: Secondary | ICD-10-CM

## 2014-05-15 DIAGNOSIS — Z0000 Encounter for general adult medical examination without abnormal findings: Secondary | ICD-10-CM

## 2014-05-15 DIAGNOSIS — J449 Chronic obstructive pulmonary disease, unspecified: Secondary | ICD-10-CM

## 2014-05-15 DIAGNOSIS — R208 Other disturbances of skin sensation: Secondary | ICD-10-CM | POA: Diagnosis not present

## 2014-05-15 LAB — LIPID PANEL
CHOL/HDL RATIO: 3
Cholesterol: 162 mg/dL (ref 0–200)
HDL: 52.2 mg/dL (ref >39.00)
LDL Cholesterol: 87 mg/dL (ref 0–99)
NonHDL: 109.8
TRIGLYCERIDES: 116 mg/dL (ref 0.0–149.0)
VLDL: 23.2 mg/dL (ref 0.0–40.0)

## 2014-05-15 LAB — HEPATIC FUNCTION PANEL
ALT: 17 U/L (ref 0–53)
AST: 24 U/L (ref 0–37)
Albumin: 4.5 g/dL (ref 3.5–5.2)
Alkaline Phosphatase: 40 U/L (ref 39–117)
Bilirubin, Direct: 0.2 mg/dL (ref 0.0–0.3)
TOTAL PROTEIN: 7.2 g/dL (ref 6.0–8.3)
Total Bilirubin: 0.8 mg/dL (ref 0.2–1.2)

## 2014-05-15 LAB — CBC WITH DIFFERENTIAL/PLATELET
BASOS ABS: 0 10*3/uL (ref 0.0–0.1)
Basophils Relative: 0.7 % (ref 0.0–3.0)
EOS PCT: 5 % (ref 0.0–5.0)
Eosinophils Absolute: 0.3 10*3/uL (ref 0.0–0.7)
HCT: 47 % (ref 39.0–52.0)
HEMOGLOBIN: 15.9 g/dL (ref 13.0–17.0)
LYMPHS PCT: 24.1 % (ref 12.0–46.0)
Lymphs Abs: 1.6 10*3/uL (ref 0.7–4.0)
MCHC: 33.9 g/dL (ref 30.0–36.0)
MCV: 88.1 fl (ref 78.0–100.0)
Monocytes Absolute: 0.8 10*3/uL (ref 0.1–1.0)
Monocytes Relative: 11.3 % (ref 3.0–12.0)
NEUTROS PCT: 58.9 % (ref 43.0–77.0)
Neutro Abs: 3.9 10*3/uL (ref 1.4–7.7)
Platelets: 183 10*3/uL (ref 150.0–400.0)
RBC: 5.34 Mil/uL (ref 4.22–5.81)
RDW: 13.1 % (ref 11.5–15.5)
WBC: 6.7 10*3/uL (ref 4.0–10.5)

## 2014-05-15 LAB — URINALYSIS, ROUTINE W REFLEX MICROSCOPIC
Bilirubin Urine: NEGATIVE
Hgb urine dipstick: NEGATIVE
KETONES UR: NEGATIVE
Leukocytes, UA: NEGATIVE
Nitrite: NEGATIVE
RBC / HPF: NONE SEEN (ref 0–?)
SPECIFIC GRAVITY, URINE: 1.02 (ref 1.000–1.030)
TOTAL PROTEIN, URINE-UPE24: NEGATIVE
Urine Glucose: NEGATIVE
Urobilinogen, UA: 0.2 (ref 0.0–1.0)
WBC, UA: NONE SEEN (ref 0–?)
pH: 6 (ref 5.0–8.0)

## 2014-05-15 LAB — BASIC METABOLIC PANEL
BUN: 23 mg/dL (ref 6–23)
CO2: 35 mEq/L — ABNORMAL HIGH (ref 19–32)
Calcium: 10 mg/dL (ref 8.4–10.5)
Chloride: 97 mEq/L (ref 96–112)
Creatinine, Ser: 1.41 mg/dL (ref 0.40–1.50)
GFR: 52.33 mL/min — ABNORMAL LOW (ref >60.00)
Glucose, Bld: 103 mg/dL — ABNORMAL HIGH (ref 70–99)
Potassium: 4 mEq/L (ref 3.5–5.1)
Sodium: 140 mEq/L (ref 135–145)

## 2014-05-15 LAB — PSA: PSA: 1.27 ng/mL (ref 0.10–4.00)

## 2014-05-15 LAB — TSH: TSH: 1.91 u[IU]/mL (ref 0.35–4.50)

## 2014-05-15 LAB — HEMOGLOBIN A1C: Hgb A1c MFr Bld: 5.7 % (ref 4.6–6.5)

## 2014-05-15 MED ORDER — TIOTROPIUM BROMIDE MONOHYDRATE 18 MCG IN CAPS: 18.0000 ug | ORAL_CAPSULE | Freq: Every day | RESPIRATORY_TRACT | Status: DC

## 2014-05-15 MED ORDER — GABAPENTIN 100 MG PO CAPS: 100.0000 mg | ORAL_CAPSULE | Freq: Three times a day (TID) | ORAL | Status: DC

## 2014-05-15 MED ORDER — TERAZOSIN HCL 10 MG PO CAPS: 10.0000 mg | ORAL_CAPSULE | Freq: Every day | ORAL | Status: DC

## 2014-05-15 MED ORDER — CARVEDILOL 12.5 MG PO TABS
12.5000 mg | ORAL_TABLET | Freq: Two times a day (BID) | ORAL | Status: DC
Start: 2014-05-15 — End: 2015-06-02

## 2014-05-15 MED ORDER — ALBUTEROL SULFATE HFA 108 (90 BASE) MCG/ACT IN AERS: 2.0000 | INHALATION_SPRAY | Freq: Four times a day (QID) | RESPIRATORY_TRACT | Status: DC | PRN

## 2014-05-15 MED ORDER — HYDROCHLOROTHIAZIDE 25 MG PO TABS: 25.0000 mg | ORAL_TABLET | Freq: Every day | ORAL | Status: DC

## 2014-05-15 MED ORDER — LOVASTATIN 20 MG PO TABS: 40.0000 mg | ORAL_TABLET | Freq: Every day | ORAL | Status: DC

## 2014-05-15 NOTE — Assessment & Plan Note (Signed)
stable overall by history and exam, recent data reviewed with pt, and pt to continue medical treatment as before,  to f/u any worsening symptoms or concerns Lab Results  Component Value Date   HGBA1C 5.5 05/01/2013

## 2014-05-15 NOTE — Assessment & Plan Note (Signed)
?   Occipital neuralgia - for trial gabapentin 100 tid, consider 300- tid if not effective enough

## 2014-05-15 NOTE — Progress Notes (Signed)
Pre visit review using our clinic review tool, if applicable. No additional management support is needed unless otherwise documented below in the visit note. 

## 2014-05-15 NOTE — Patient Instructions (Signed)
Please take all new medication as prescribed - the 2 inhalers, as well as the gabapentin 100 mg three times per day for the soreness  If the 100 mg gabapentin seem to help somewhat, but not enough, please call in 1 wk to consider increase to 200 or 300 mg dosing  Please continue all other medications as before, and refills have been done if requested.  Please have the pharmacy call with any other refills you may need.  Please continue your efforts at being more active, low cholesterol diet, and weight control.  You are otherwise up to date with prevention measures today.  Please keep your appointments with your specialists as you may have planned  Please go to the LAB in the Basement (turn left off the elevator) for the tests to be done today  You will be contacted by phone if any changes need to be made immediately.  Otherwise, you will receive a letter about your results with an explanation, but please check with MyChart first.  Please remember to sign up for MyChart if you have not done so, as this will be important to you in the future with finding out test results, communicating by private email, and scheduling acute appointments online when needed.  Please return in 6 months, or sooner if needed

## 2014-05-15 NOTE — Progress Notes (Signed)
Subjective:    Patient ID: Justin Walters, male    DOB: 1940/05/31, 74 y.o.   MRN: [id number]  HPI  Here for wellness and f/u;  Overall doing ok;  Pt denies Chest pain, worsening SOB, DOE, wheezing, orthopnea, PND, worsening LE edema, palpitations, dizziness or syncope.  Pt denies neurological change such as new headache, facial or extremity weakness.  Pt denies polydipsia, polyuria, or low sugar symptoms. Pt states overall good compliance with treatment and medications, good tolerability, and has been trying to follow appropriate diet.  Pt denies worsening depressive symptoms, suicidal ideation or panic. No fever, night sweats, wt loss, loss of appetite, or other constitutional symptoms.  Pt states good ability with ADL's, has low fall risk, home safety reviewed and adequate, no other significant changes in hearing or vision, and only occasionally active with exercise.  Sees urology Dr Janice Norrie with elevated PSA, prostatits, BPH, no prostate cancer.  Last PSA 7.79 mar 2015.  Also has some unusual skin sensitivity bilat facies in the bear area (is clean shaven today), then post head pain, and more lately soreness to touch the crown of head, no skin change.  Past Medical History  Diagnosis Date  . Hypertension   . Hyperlipidemia   . Diverticulosis of colon   . BPH (benign prostatic hyperplasia)   . Bladder stone   . Dysuria   . History of kidney stones   . Glucose intolerance (impaired glucose tolerance)   . At risk for sleep apnea     STOP-BANG= 5    SENT TO PCP 06-13-2013  . COPD (chronic obstructive pulmonary disease)    Past Surgical History  Procedure Laterality Date  . Cataract extraction w/ intraocular lens  implant, bilateral    . Laparoscopic inguinal hernia repair Bilateral 10-09-2003  . Appendectomy  1980's  . Transurethral resection of prostate N/A 06/23/2013    Procedure: TRANSURETHRAL RESECTION OF THE PROSTATE;  Surgeon: Arvil Persons, MD;  Location: [facility];   Service: Urology;  Laterality: N/A;  . Cystoscopy N/A 06/23/2013    Procedure: CYSTOSCOPY WITH LITHOLAPAXY, BLADDER EXPLORATION, REPAIR BLADDER PERFORATION;  Surgeon: Arvil Persons, MD;  Location: [place];  Service: Urology;  Laterality: N/A;  . Holmium laser application N/A 08/14/7515    Procedure: HOLMIUM LASER APPLICATION;  Surgeon: Arvil Persons, MD;  Location: [facility];  Service: Urology;  Laterality: N/A;  . Colonoscopy  10-05-2003    tics only     reports that he quit smoking about 21 years ago. His smoking use included Cigarettes. He has a 102 pack-year smoking history. He has never used smokeless tobacco. He reports that he drinks about 2.0 oz of alcohol per week. He reports that he does not use illicit drugs. family history includes COPD in his mother. There is no history of Colon cancer, Esophageal cancer, Rectal cancer, or Stomach cancer. No Known Allergies Current Outpatient Prescriptions on File Prior to Visit  Medication Sig Dispense Refill  . aspirin 81 MG EC tablet Take 81 mg by mouth daily. Swallow whole.    . Multiple Vitamin (MULTIVITAMIN) tablet Take 1 tablet by mouth daily.     No current facility-administered medications on file prior to visit.   Review of Systems Constitutional: Negative for increased diaphoresis, other activity, appetite or siginficant weight change other than noted HENT: Negative for worsening hearing loss, ear pain, facial swelling, mouth sores and neck stiffness.   Eyes: Negative for other worsening pain,  redness or visual disturbance.  Respiratory: Negative for shortness of breath and wheezing  Cardiovascular: Negative for chest pain and palpitations.  Gastrointestinal: Negative for diarrhea, blood in stool, abdominal distention or other pain Genitourinary: Negative for hematuria, flank pain or change in urine volume.  Musculoskeletal: Negative for myalgias or other joint complaints.  Skin: Negative for color  change and wound or drainage.  Neurological: Negative for syncope and numbness. other than noted Hematological: Negative for adenopathy. or other swelling Psychiatric/Behavioral: Negative for hallucinations, SI, self-injury, decreased concentration or other worsening agitation.      Objective:   Physical Exam BP 106/70 mmHg  Pulse 80  Temp(Src) 97.7 F (36.5 C) (Oral)  Ht 5\' 7"  (1.702 m)  Wt 160 lb 8 oz (72.802 kg)  BMI 25.13 kg/m2  SpO2 94% VS noted,  Constitutional: Pt is oriented to person, place, and time. Appears well-developed and well-nourished, in no significant distress Head: Normocephalic and atraumatic.  Right Ear: External ear normal.  Left Ear: External ear normal.  Nose: Nose normal.  Mouth/Throat: Oropharynx is clear and moist.  Eyes: Conjunctivae and EOM are normal. Pupils are equal, round, and reactive to light.  Neck: Normal range of motion. Neck supple. No JVD present. No tracheal deviation present or significant neck LA or mass Cardiovascular: Normal rate, regular rhythm, normal heart sounds and intact distal pulses.   Pulmonary/Chest: Effort normal and breath sounds decresed without rales or wheezing  Abdominal: Soft. Bowel sounds are normal. NT. No HSM  Musculoskeletal: Normal range of motion. Exhibits no edema.  Lymphadenopathy:  Has no cervical adenopathy.  Neurological: Pt is alert and oriented to person, place, and time. Pt has normal reflexes. No cranial nerve deficit. Motor grossly intact Skin: Skin is warm and dry. No rash noted. + scalp soreness at crown and post head  Psychiatric:  Has normal mood and affect. Behavior is normal.      Assessment & Plan:

## 2014-05-15 NOTE — Assessment & Plan Note (Signed)

## 2014-05-15 NOTE — Assessment & Plan Note (Signed)
With sob/doe with benign exam today , out of inhalers - for re-start today,  to f/u any worsening symptoms or concerns, declines cxr

## 2014-05-29 ENCOUNTER — Telehealth: Payer: Self-pay | Admitting: Internal Medicine

## 2014-05-29 NOTE — Telephone Encounter (Signed)
Please advise in PCP's absence, thanks! 

## 2014-05-29 NOTE — Telephone Encounter (Signed)
Called pt back no answer LMOM with Greg response...Johny Chess

## 2014-05-29 NOTE — Telephone Encounter (Signed)
He can increase to 2 pills 3 times per day and if this does not work then he can increase to 3 pills 3 times per day.

## 2014-05-29 NOTE — Telephone Encounter (Signed)
Pt called in and said that Dr Jenny Reichmann told him that if the Gabapentin was not working to call and he would increase it.  Pt aware Dr Jenny Reichmann not in office

## 2014-06-04 DIAGNOSIS — R972 Elevated prostate specific antigen [PSA]: Secondary | ICD-10-CM | POA: Diagnosis not present

## 2014-06-04 DIAGNOSIS — N359 Urethral stricture, unspecified: Secondary | ICD-10-CM | POA: Diagnosis not present

## 2014-06-08 DIAGNOSIS — R339 Retention of urine, unspecified: Secondary | ICD-10-CM | POA: Diagnosis not present

## 2014-06-08 DIAGNOSIS — N359 Urethral stricture, unspecified: Secondary | ICD-10-CM | POA: Diagnosis not present

## 2014-07-13 ENCOUNTER — Telehealth: Payer: Self-pay | Admitting: Internal Medicine

## 2014-07-13 DIAGNOSIS — R51 Headache: Principal | ICD-10-CM

## 2014-07-13 DIAGNOSIS — R519 Headache, unspecified: Secondary | ICD-10-CM

## 2014-07-13 NOTE — Telephone Encounter (Signed)
Last time patient was in they talked about seeing a dermatologist. He is now ready and will need a referral to one. Please call patient to let him know [phone]

## 2014-07-14 NOTE — Telephone Encounter (Signed)
Please advise on referral in PCP's absence. Last OV states "scalp soreness at crown and post head" Thanks

## 2014-07-14 NOTE — Telephone Encounter (Signed)
Records reviewed; last skin entry concerned possible neuropathic skin soreness  Skin lesion mentioned in Problem List in 2013  We need Dr Jenny Reichmann to define Dx for Derm referral

## 2014-07-20 NOTE — Telephone Encounter (Signed)
I suspect possible neuritic pain with last visit, so dermatology would not likely be helpful  I can refer to neurology if he likes

## 2014-07-21 NOTE — Telephone Encounter (Signed)
LMOVM for pt to call back and advise if ok with preceding with Neurology referral.

## 2014-07-21 NOTE — Telephone Encounter (Signed)
Referral done

## 2014-07-21 NOTE — Telephone Encounter (Signed)
Spoke with wife who advised that pt would like to proceed with Neurology referral.

## 2014-08-07 DIAGNOSIS — N359 Urethral stricture, unspecified: Secondary | ICD-10-CM | POA: Diagnosis not present

## 2014-08-07 DIAGNOSIS — R339 Retention of urine, unspecified: Secondary | ICD-10-CM | POA: Diagnosis not present

## 2014-09-04 ENCOUNTER — Encounter: Payer: Self-pay | Admitting: Neurology

## 2014-09-04 ENCOUNTER — Ambulatory Visit (INDEPENDENT_AMBULATORY_CARE_PROVIDER_SITE_OTHER): Payer: Commercial Managed Care - HMO | Admitting: Neurology

## 2014-09-04 VITALS — BP 112/68 | HR 64 | Resp 18 | Ht 67.0 in | Wt 165.4 lb

## 2014-09-04 DIAGNOSIS — G501 Atypical facial pain: Secondary | ICD-10-CM | POA: Diagnosis not present

## 2014-09-04 DIAGNOSIS — G529 Cranial nerve disorder, unspecified: Secondary | ICD-10-CM | POA: Diagnosis not present

## 2014-09-04 DIAGNOSIS — R519 Headache, unspecified: Secondary | ICD-10-CM

## 2014-09-04 DIAGNOSIS — R51 Headache: Secondary | ICD-10-CM

## 2014-09-04 NOTE — Patient Instructions (Addendum)
1.  Start taking the gabapentin 100mg  three times daily 2.  We will get MRI of brain with and without contrast [facility]  09/14/14 4:45pm 3.  Follow up in 3 months but call sooner if pain gets worse

## 2014-09-04 NOTE — Progress Notes (Signed)
NEUROLOGY CONSULTATION NOTE  Justin Walters MRN: [id number] DOB: February 13, 1940  Referring provider: Dr. Jenny Reichmann Primary care provider: Dr. Jenny Reichmann  Reason for consult:  Head and facial pain  HISTORY OF PRESENT ILLNESS: Justin Walters is a 74 year old right-handed male with hypertension, hyperlipidemia, impaired glucose tolerance, BPH, COPD and history of kidney stones who presents for atypical facial and head pain.  History obtained from patient and PCP notes.  Labs reviewed.  About 4 years ago, he noted that his "whiskers started to hurt".  It would feel like a tingling or burning discomfort.  He initially had a red dot on his neck, which subsequently disappeared but never had any other rash.  He decided to let his beard grow, but that caused more discomfort.  He notes the discomfort on the face where he grows facial hair.  It will feel hot.  There is no abnormal sweating or lack of sweating on the face. Then, he developed episodes of discomfort on the crown of his head.  It also was a tingling and aching discomfort that is relieved when he rubs it.  It occurs daily.  At one point, he had an electric pain from his neck shooting up to his occiput, but that rarely occurs.  He denies neck pain.  He takes gabapentin 100mg  to 200mg  as needed when he feels the head discomfort.    Recent labs, including CBC, CMP, TSH and Hgb A1c, were unremarkable.  PAST MEDICAL HISTORY: Past Medical History  Diagnosis Date  . Hypertension   . Hyperlipidemia   . Diverticulosis of colon   . BPH (benign prostatic hyperplasia)   . Bladder stone   . Dysuria   . History of kidney stones   . Glucose intolerance (impaired glucose tolerance)   . At risk for sleep apnea     STOP-BANG= 5    SENT TO PCP 06-13-2013  . COPD (chronic obstructive pulmonary disease)     PAST SURGICAL HISTORY: Past Surgical History  Procedure Laterality Date  . Cataract extraction w/ intraocular lens  implant, bilateral    . Laparoscopic  inguinal hernia repair Bilateral 10-09-2003  . Appendectomy  1980's  . Transurethral resection of prostate N/A 06/23/2013    Procedure: TRANSURETHRAL RESECTION OF THE PROSTATE;  Surgeon: Arvil Persons, MD;  Location: [facility];  Service: Urology;  Laterality: N/A;  . Cystoscopy N/A 06/23/2013    Procedure: CYSTOSCOPY WITH LITHOLAPAXY, BLADDER EXPLORATION, REPAIR BLADDER PERFORATION;  Surgeon: Arvil Persons, MD;  Location: [place];  Service: Urology;  Laterality: N/A;  . Holmium laser application N/A 7/94/8016    Procedure: HOLMIUM LASER APPLICATION;  Surgeon: Arvil Persons, MD;  Location: [facility];  Service: Urology;  Laterality: N/A;  . Colonoscopy  10-05-2003    tics only     MEDICATIONS: Current Outpatient Prescriptions on File Prior to Visit  Medication Sig Dispense Refill  . albuterol (PROVENTIL HFA;VENTOLIN HFA) 108 (90 BASE) MCG/ACT inhaler Inhale 2 puffs into the lungs every 6 (six) hours as needed for wheezing or shortness of breath. 1 Inhaler 11  . aspirin 81 MG EC tablet Take 81 mg by mouth daily. Swallow whole.    . carvedilol (COREG) 12.5 MG tablet Take 1 tablet (12.5 mg total) by mouth 2 (two) times daily with a meal. 180 tablet 3  . gabapentin (NEURONTIN) 100 MG capsule Take 1 capsule (100 mg total) by mouth 3 (three) times daily. 90 capsule 5  .  hydrochlorothiazide (HYDRODIURIL) 25 MG tablet Take 1 tablet (25 mg total) by mouth daily. 90 tablet 3  . lovastatin (MEVACOR) 20 MG tablet Take 2 tablets (40 mg total) by mouth at bedtime. 180 tablet 3  . Multiple Vitamin (MULTIVITAMIN) tablet Take 1 tablet by mouth daily.    Marland Kitchen terazosin (HYTRIN) 10 MG capsule Take 1 capsule (10 mg total) by mouth at bedtime. 90 capsule 3  . tiotropium (SPIRIVA HANDIHALER) 18 MCG inhalation capsule Place 1 capsule (18 mcg total) into inhaler and inhale daily. 30 capsule 12   No current facility-administered medications on file prior to visit.     ALLERGIES: No Known Allergies  FAMILY HISTORY: Family History  Problem Relation Age of Onset  . COPD Mother   . Colon cancer Neg Hx   . Esophageal cancer Neg Hx   . Rectal cancer Neg Hx   . Stomach cancer Neg Hx   . Cancer Father     unknown  . Emphysema Brother   . Heart failure Brother     SOCIAL HISTORY: Social History   Social History  . Marital Status: Married    Spouse Name: N/A  . Number of Children: 3  . Years of Education: N/A   Occupational History  . Retired Chemical engineer)    Social History Main Topics  . Smoking status: Former Smoker -- 3.00 packs/day for 34 years    Types: Cigarettes    Quit date: 01/02/1993  . Smokeless tobacco: Never Used  . Alcohol Use: 2.4 oz/week    4 Standard drinks or equivalent per week     Comment: rare  . Drug Use: No  . Sexual Activity:    Partners: Female   Other Topics Concern  . Not on file   Social History Narrative    REVIEW OF SYSTEMS: Constitutional: No fevers, chills, or sweats, no generalized fatigue, change in appetite Eyes: No visual changes, double vision, eye pain Ear, nose and throat: No hearing loss, ear pain, nasal congestion, sore throat Cardiovascular: No chest pain, palpitations Respiratory:  No shortness of breath at rest or with exertion, wheezes GastrointestinaI: No nausea, vomiting, diarrhea, abdominal pain, fecal incontinence Genitourinary:  No dysuria, urinary retention or frequency Musculoskeletal:  No neck pain, back pain Integumentary: No rash, pruritus, skin lesions Neurological: as above Psychiatric: No depression, insomnia, anxiety Endocrine: No palpitations, fatigue, diaphoresis, mood swings, change in appetite, change in weight, increased thirst Hematologic/Lymphatic:  No anemia, purpura, petechiae. Allergic/Immunologic: no itchy/runny eyes, nasal congestion, recent allergic reactions, rashes  PHYSICAL EXAM: Filed Vitals:   09/04/14 0753  BP: 112/68   Pulse: 64  Resp: 18   General: No acute distress.  Patient appears well-groomed.  Head:  Normocephalic/atraumatic Eyes:  fundi unremarkable, without vessel changes, exudates, hemorrhages or papilledema. Neck: supple, no paraspinal tenderness, full range of motion Back: No paraspinal tenderness Heart: regular rate and rhythm Lungs: Clear to auscultation bilaterally. Vascular: No carotid bruits. Neurological Exam: Mental status: alert and oriented to person, place, and time, recent and remote memory intact, fund of knowledge intact, attention and concentration intact, speech fluent and not dysarthric, language intact. Cranial nerves: CN I: not tested CN II: pupils equal, round and reactive to light, visual fields intact, fundi unremarkable, without vessel changes, exudates, hemorrhages or papilledema. CN III, IV, VI:  full range of motion, no nystagmus, no ptosis CN V: facial sensation intact CN VII: upper and lower face symmetric CN VIII: hearing intact CN IX, X: gag intact, uvula  midline CN XI: sternocleidomastoid and trapezius muscles intact CN XII: tongue midline Bulk & Tone: normal, no fasciculations. Motor:  5/5 throughout Sensation:  Pinprick and vibration sensation intact. Deep Tendon Reflexes:  2+ throughout, except 3+ in patellars, toes downgoing.  Finger to nose testing:  Without dysmetria.  Mild intention tremor. Heel to shin:  Without dysmetria. Gait:  Normal station and stride.  Able to turn and tandem walk. Romberg negative.  IMPRESSION: Atypical facial pain and cephalgia  PLAN: Will check MRI of brain with and without contrast Advised to take gabapentin 100mg  three times daily Follow up in 3 months but further testing pending results of brain MRI  Thank you for allowing me to take part in the care of this patient.  Metta Clines, DO  CC:  Cathlean Cower, MD

## 2014-09-14 ENCOUNTER — Ambulatory Visit (HOSPITAL_COMMUNITY)
Admission: RE | Admit: 2014-09-14 | Discharge: 2014-09-14 | Disposition: A | Discharge status: Home / Self Care | Payer: Commercial Managed Care - HMO | Source: Ambulatory Visit | Attending: Neurology | Admitting: Neurology

## 2014-09-14 ENCOUNTER — Other Ambulatory Visit: Payer: Self-pay | Admitting: Neurology

## 2014-09-14 DIAGNOSIS — Z01818 Encounter for other preprocedural examination: Secondary | ICD-10-CM | POA: Diagnosis not present

## 2014-09-14 DIAGNOSIS — G319 Degenerative disease of nervous system, unspecified: Secondary | ICD-10-CM | POA: Insufficient documentation

## 2014-09-14 DIAGNOSIS — R51 Headache: Secondary | ICD-10-CM | POA: Diagnosis not present

## 2014-09-14 DIAGNOSIS — Z1389 Encounter for screening for other disorder: Secondary | ICD-10-CM | POA: Diagnosis not present

## 2014-09-14 DIAGNOSIS — I739 Peripheral vascular disease, unspecified: Secondary | ICD-10-CM | POA: Diagnosis not present

## 2014-09-14 DIAGNOSIS — S0540XA Penetrating wound of orbit with or without foreign body, unspecified eye, initial encounter: Secondary | ICD-10-CM

## 2014-09-14 DIAGNOSIS — E785 Hyperlipidemia, unspecified: Secondary | ICD-10-CM | POA: Insufficient documentation

## 2014-09-14 DIAGNOSIS — D170 Benign lipomatous neoplasm of skin and subcutaneous tissue of head, face and neck: Secondary | ICD-10-CM | POA: Diagnosis not present

## 2014-09-14 DIAGNOSIS — G529 Cranial nerve disorder, unspecified: Secondary | ICD-10-CM

## 2014-09-14 DIAGNOSIS — R519 Headache, unspecified: Secondary | ICD-10-CM

## 2014-09-14 DIAGNOSIS — I10 Essential (primary) hypertension: Secondary | ICD-10-CM | POA: Diagnosis not present

## 2014-09-14 DIAGNOSIS — G501 Atypical facial pain: Secondary | ICD-10-CM

## 2014-09-14 LAB — CREATININE, SERUM
Creatinine, Ser: 1.47 mg/dL — ABNORMAL HIGH (ref 0.61–1.24)
GFR calc Af Amer: 53 mL/min — ABNORMAL LOW (ref >60)
GFR calc non Af Amer: 46 mL/min — ABNORMAL LOW (ref >60)

## 2014-09-14 MED ORDER — GADOBENATE DIMEGLUMINE 529 MG/ML IV SOLN
15.0000 mL | Freq: Once | INTRAVENOUS | Status: AC | PRN
  Administered 2014-09-14: 15 mL via INTRAVENOUS

## 2014-09-15 ENCOUNTER — Telehealth: Payer: Self-pay

## 2014-09-15 NOTE — Telephone Encounter (Signed)
Spoke with pt. Pt. Advised of all results and verbalized understanding. Patient will cb with any questions or concerns. [facility]

## 2014-09-15 NOTE — Telephone Encounter (Signed)
-----   Message from Pieter Partridge, DO sent at 09/15/2014  7:40 AM EDT ----- MRI of the brain does not show any cause of the facial pain

## 2014-11-17 ENCOUNTER — Encounter: Payer: Self-pay | Admitting: Internal Medicine

## 2014-11-17 ENCOUNTER — Ambulatory Visit (INDEPENDENT_AMBULATORY_CARE_PROVIDER_SITE_OTHER): Payer: Commercial Managed Care - HMO | Admitting: Internal Medicine

## 2014-11-17 VITALS — BP 110/68 | HR 74 | Temp 97.6°F | Ht 67.0 in | Wt 170.0 lb

## 2014-11-17 DIAGNOSIS — I10 Essential (primary) hypertension: Secondary | ICD-10-CM | POA: Diagnosis not present

## 2014-11-17 DIAGNOSIS — Z0000 Encounter for general adult medical examination without abnormal findings: Secondary | ICD-10-CM

## 2014-11-17 DIAGNOSIS — J449 Chronic obstructive pulmonary disease, unspecified: Secondary | ICD-10-CM

## 2014-11-17 DIAGNOSIS — E785 Hyperlipidemia, unspecified: Secondary | ICD-10-CM

## 2014-11-17 DIAGNOSIS — R7302 Impaired glucose tolerance (oral): Secondary | ICD-10-CM

## 2014-11-17 DIAGNOSIS — Z23 Encounter for immunization: Secondary | ICD-10-CM

## 2014-11-17 DIAGNOSIS — Z0189 Encounter for other specified special examinations: Secondary | ICD-10-CM

## 2014-11-17 MED ORDER — ROSUVASTATIN CALCIUM 20 MG PO TABS: 20.0000 mg | ORAL_TABLET | Freq: Every day | ORAL | Status: DC

## 2014-11-17 MED ORDER — GABAPENTIN 100 MG PO CAPS: ORAL_CAPSULE | ORAL | Status: DC

## 2014-11-17 NOTE — Assessment & Plan Note (Signed)
Improved last visit Lab Results  Component Value Date   LDLCALC 87 05/15/2014   Goal < 70- ok to try change lovastatin to crestor 20 mg, fu labs next visit

## 2014-11-17 NOTE — Progress Notes (Signed)
Subjective:    Patient ID: Justin Walters, male    DOB: 28-Oct-1940, 74 y.o.   MRN: [id number]  HPI  Here to f/u; overall doing ok,  Pt denies chest pain, increasing sob or doe, wheezing, orthopnea, PND, increased LE swelling, palpitations, dizziness or syncope.  Pt denies new neurological symptoms such as new headache, or facial or extremity weakness or numbness.  Pt denies polydipsia, polyuria, or low sugar episode.   Pt denies new neurological symptoms such as new headache, or facial or extremity weakness or numbness.   Pt states overall good compliance with meds, mostly trying to follow appropriate diet, with wt overall stable,  but little exercise however.  Wife died one mo ago.  Still griveing, Denies worsening depressive symptoms, suicidal ideation, or panic. Has seen neurology - gabapentin 100 tid helps somewhat, tolerating gabapentin overall, has had some sleepiness and fatigue but may be related to grieiving?  Has f/u appt next mo. Still doing self cath 3 times per wk, sees urology Dr Janice Norrie who retired, now has new urologist, with f/u dec 13. Past Medical History  Diagnosis Date  . Hypertension   . Hyperlipidemia   . Diverticulosis of colon   . BPH (benign prostatic hyperplasia)   . Bladder stone   . Dysuria   . History of kidney stones   . Glucose intolerance (impaired glucose tolerance)   . At risk for sleep apnea     STOP-BANG= 5    SENT TO PCP 06-13-2013  . COPD (chronic obstructive pulmonary disease) [facility])    Past Surgical History  Procedure Laterality Date  . Cataract extraction w/ intraocular lens  implant, bilateral    . Laparoscopic inguinal hernia repair Bilateral 10-09-2003  . Appendectomy  1980's  . Transurethral resection of prostate N/A 06/23/2013    Procedure: TRANSURETHRAL RESECTION OF THE PROSTATE;  Surgeon: Arvil Persons, MD;  Location: [facility];  Service: Urology;  Laterality: N/A;  . Cystoscopy N/A 06/23/2013    Procedure: CYSTOSCOPY WITH  LITHOLAPAXY, BLADDER EXPLORATION, REPAIR BLADDER PERFORATION;  Surgeon: Arvil Persons, MD;  Location: [place];  Service: Urology;  Laterality: N/A;  . Holmium laser application N/A [id number]    Procedure: HOLMIUM LASER APPLICATION;  Surgeon: Arvil Persons, MD;  Location: [facility];  Service: Urology;  Laterality: N/A;  . Colonoscopy  10-05-2003    tics only     reports that he quit smoking about 21 years ago. His smoking use included Cigarettes. He has a 102 pack-year smoking history. He has never used smokeless tobacco. He reports that he drinks about 2.4 oz of alcohol per week. He reports that he does not use illicit drugs. family history includes COPD in his mother; Cancer in his father; Emphysema in his brother; Heart failure in his brother. There is no history of Colon cancer, Esophageal cancer, Rectal cancer, or Stomach cancer. No Known Allergies Current Outpatient Prescriptions on File Prior to Visit  Medication Sig Dispense Refill  . albuterol (PROVENTIL HFA;VENTOLIN HFA) 108 (90 BASE) MCG/ACT inhaler Inhale 2 puffs into the lungs every 6 (six) hours as needed for wheezing or shortness of breath. 1 Inhaler 11  . aspirin 81 MG EC tablet Take 81 mg by mouth daily. Swallow whole.    . carvedilol (COREG) 12.5 MG tablet Take 1 tablet (12.5 mg total) by mouth 2 (two) times daily with a meal. 180 tablet 3  . gabapentin (NEURONTIN) 100 MG capsule Take 1 capsule (  100 mg total) by mouth 3 (three) times daily. 90 capsule 5  . hydrochlorothiazide (HYDRODIURIL) 25 MG tablet Take 1 tablet (25 mg total) by mouth daily. 90 tablet 3  . Multiple Vitamin (MULTIVITAMIN) tablet Take 1 tablet by mouth daily.    Marland Kitchen terazosin (HYTRIN) 10 MG capsule Take 1 capsule (10 mg total) by mouth at bedtime. 90 capsule 3  . tiotropium (SPIRIVA HANDIHALER) 18 MCG inhalation capsule Place 1 capsule (18 mcg total) into inhaler and inhale daily. 30 capsule 12   No current facility-administered  medications on file prior to visit.     Review of Systems     Objective:   Physical Exam        Assessment & Plan:

## 2014-11-17 NOTE — Assessment & Plan Note (Signed)
stable overall by history and exam, recent data reviewed with pt, and pt to continue medical treatment as before,  to f/u any worsening symptoms or concerns SpO2 Readings from Last 3 Encounters:  11/17/14 94%  09/04/14 95%  05/15/14 94%

## 2014-11-17 NOTE — Assessment & Plan Note (Signed)
stable overall by history and exam, recent data reviewed with pt, and pt to continue medical treatment as before,  to f/u any worsening symptoms or concerns Lab Results  Component Value Date   HGBA1C 5.7 05/15/2014

## 2014-11-17 NOTE — Patient Instructions (Signed)
You had the flu shot today  OK to increase the gabapentin to 200 mg three times per day  OK to stop the lovastatin  Please take all new medication as prescribed - the generic crestor 20 mg per day  Please continue all other medications as before, and refills have been done if requested.  Please have the pharmacy call with any other refills you may need.  Please continue your efforts at being more active, low cholesterol diet, and weight control.  Please keep your appointments with your specialists as you may have planned  Please return in 6 months, or sooner if needed, with Lab testing done 3-5 days before

## 2014-11-17 NOTE — Assessment & Plan Note (Signed)
stable overall by history and exam, recent data reviewed with pt, and pt to continue medical treatment as before,  to f/u any worsening symptoms or concerns BP Readings from Last 3 Encounters:  11/17/14 110/68  09/04/14 112/68  05/15/14 106/70

## 2014-11-17 NOTE — Progress Notes (Signed)
Pre visit review using our clinic review tool, if applicable. No additional management support is needed unless otherwise documented below in the visit note. 

## 2014-12-09 DIAGNOSIS — R300 Dysuria: Secondary | ICD-10-CM | POA: Diagnosis not present

## 2014-12-09 DIAGNOSIS — N99111 Postprocedural bulbous urethral stricture: Secondary | ICD-10-CM | POA: Diagnosis not present

## 2014-12-16 ENCOUNTER — Encounter: Payer: Self-pay | Admitting: Neurology

## 2014-12-16 ENCOUNTER — Ambulatory Visit (INDEPENDENT_AMBULATORY_CARE_PROVIDER_SITE_OTHER): Payer: Commercial Managed Care - HMO | Admitting: Neurology

## 2014-12-16 VITALS — BP 90/62 | HR 79 | Ht 67.0 in | Wt 171.0 lb

## 2014-12-16 DIAGNOSIS — G518 Other disorders of facial nerve: Secondary | ICD-10-CM

## 2014-12-16 NOTE — Patient Instructions (Signed)
Increase gabapentin 100mg  capsules to 3 capsules three times daily.  When you need a refill, call us and let us know if we need to increase the dose. Follow up in 3 months.

## 2014-12-16 NOTE — Progress Notes (Signed)
NEUROLOGY FOLLOW UP OFFICE NOTE  [name] [id number]  HISTORY OF PRESENT ILLNESS: Justin Walters is a 74 year old right-handed male with hypertension, hyperlipidemia, impaired glucose tolerance, BPH, COPD and history of kidney stones who follows up for atypical facial pain and cephalgia.  Images of brain MRI reviewed.  UPDATE: MRI of the brain with and without contrast from 09/15/14 showed no acute intracranial abnormality.  He has been taking gabapentin 200mg  twice daily (he sometimes forgets to take the noon dose).  He hasn't noticed much improvement.  HISTORY: About 4 years ago, he noted that his "whiskers started to hurt".  It would feel like a tingling or burning discomfort.  He initially had a red dot on his neck, which subsequently disappeared but never had any other rash.  He decided to let his beard grow, but that caused more discomfort.  He notes the discomfort on the face where he grows facial hair.  It will feel hot.  There is no abnormal sweating or lack of sweating on the face. Then, he developed episodes of discomfort on the crown of his head.  It also was a tingling and aching discomfort that is relieved when he rubs it.  It occurs daily.  At one point, he had an electric pain from his neck shooting up to his occiput, but that rarely occurs.  He denies neck pain.  He takes gabapentin 100mg  to 200mg  as needed when he feels the head discomfort.   PAST MEDICAL HISTORY: Past Medical History  Diagnosis Date  . Hypertension   . Hyperlipidemia   . Diverticulosis of colon   . BPH (benign prostatic hyperplasia)   . Bladder stone   . Dysuria   . History of kidney stones   . Glucose intolerance (impaired glucose tolerance)   . At risk for sleep apnea     STOP-BANG= 5    SENT TO PCP 06-13-2013  . COPD (chronic obstructive pulmonary disease) ([facility])     MEDICATIONS: Current Outpatient Prescriptions on File Prior to Visit  Medication Sig Dispense Refill  . albuterol (PROVENTIL  HFA;VENTOLIN HFA) 108 (90 BASE) MCG/ACT inhaler Inhale 2 puffs into the lungs every 6 (six) hours as needed for wheezing or shortness of breath. 1 Inhaler 11  . aspirin 81 MG EC tablet Take 81 mg by mouth daily. Swallow whole.    . carvedilol (COREG) 12.5 MG tablet Take 1 tablet (12.5 mg total) by mouth 2 (two) times daily with a meal. 180 tablet 3  . gabapentin (NEURONTIN) 100 MG capsule 2 tabs by mouth three times per day 180 capsule 5  . hydrochlorothiazide (HYDRODIURIL) 25 MG tablet Take 1 tablet (25 mg total) by mouth daily. 90 tablet 3  . Multiple Vitamin (MULTIVITAMIN) tablet Take 1 tablet by mouth daily.    . rosuvastatin (CRESTOR) 20 MG tablet Take 1 tablet (20 mg total) by mouth daily. 90 tablet 3  . terazosin (HYTRIN) 10 MG capsule Take 1 capsule (10 mg total) by mouth at bedtime. 90 capsule 3  . tiotropium (SPIRIVA HANDIHALER) 18 MCG inhalation capsule Place 1 capsule (18 mcg total) into inhaler and inhale daily. 30 capsule 12   No current facility-administered medications on file prior to visit.    ALLERGIES: No Known Allergies  FAMILY HISTORY: Family History  Problem Relation Age of Onset  . COPD Mother   . Colon cancer Neg Hx   . Esophageal cancer Neg Hx   . Rectal cancer Neg Hx   .  Stomach cancer Neg Hx   . Cancer Father     unknown  . Emphysema Brother   . Heart failure Brother     SOCIAL HISTORY: Social History   Social History  . Marital Status: Married    Spouse Name: N/A  . Number of Children: 3  . Years of Education: N/A   Occupational History  . Retired Chemical engineer)    Social History Main Topics  . Smoking status: Former Smoker -- 3.00 packs/day for 34 years    Types: Cigarettes    Quit date: 01/02/1993  . Smokeless tobacco: Never Used  . Alcohol Use: 2.4 oz/week    4 Standard drinks or equivalent per week     Comment: rare  . Drug Use: No  . Sexual Activity:    Partners: Female   Other Topics Concern  . Not on file    Social History Narrative    REVIEW OF SYSTEMS: Constitutional: No fevers, chills, or sweats, no generalized fatigue, change in appetite Eyes: No visual changes, double vision, eye pain Ear, nose and throat: No hearing loss, ear pain, nasal congestion, sore throat Cardiovascular: No chest pain, palpitations Respiratory:  No shortness of breath at rest or with exertion, wheezes GastrointestinaI: No nausea, vomiting, diarrhea, abdominal pain, fecal incontinence Genitourinary:  No dysuria, urinary retention or frequency Musculoskeletal:  No neck pain, back pain Integumentary: No rash, pruritus, skin lesions Neurological: as above Psychiatric: No depression, insomnia, anxiety Endocrine: No palpitations, fatigue, diaphoresis, mood swings, change in appetite, change in weight, increased thirst Hematologic/Lymphatic:  No anemia, purpura, petechiae. Allergic/Immunologic: no itchy/runny eyes, nasal congestion, recent allergic reactions, rashes  PHYSICAL EXAM: Filed Vitals:   12/16/14 0751  BP: 90/62  Pulse: 79   General: No acute distress.  Patient appears well-groomed.  normal body habitus. Head:  Normocephalic/atraumatic Eyes:  Fundoscopic exam unremarkable without vessel changes, exudates, hemorrhages or papilledema. Neck: supple, no paraspinal tenderness, full range of motion Heart:  Regular rate and rhythm Lungs:  Clear to auscultation bilaterally Back: No paraspinal tenderness Neurological Exam: alert and oriented to person, place, and time. Attention span and concentration intact, recent and remote memory intact, fund of knowledge intact.  Speech fluent and not dysarthric, language intact.  CN II-XII intact. Fundoscopic exam unremarkable without vessel changes, exudates, hemorrhages or papilledema.  Bulk and tone normal, muscle strength 5/5 throughout.  Sensation to light touch  intact.  Deep tendon reflexes 2+ throughout.  Finger to nose and heel to shin testing intact.  Gait  normal  IMPRESSION: Facial neuralgia, unclear etiology  PLAN: 1.  Advised to increase gabapentin to 300mg  three times daily.  He will call us when he needs a refill and we can increase dose to 600mg  three times daily if needed 2.  Follow up in 3 months.  15 minutes spent face to face with patient, over 50% spent discussing etiology and management.  Metta Clines, DO  CC:  Cathlean Cower, MD

## 2015-01-16 ENCOUNTER — Ambulatory Visit (INDEPENDENT_AMBULATORY_CARE_PROVIDER_SITE_OTHER): Payer: Commercial Managed Care - HMO | Admitting: Internal Medicine

## 2015-01-16 VITALS — BP 114/76 | HR 82 | Temp 98.8°F | Resp 18 | Wt 165.0 lb

## 2015-01-16 DIAGNOSIS — K529 Noninfective gastroenteritis and colitis, unspecified: Secondary | ICD-10-CM | POA: Diagnosis not present

## 2015-01-16 DIAGNOSIS — R6889 Other general symptoms and signs: Secondary | ICD-10-CM | POA: Diagnosis not present

## 2015-01-16 DIAGNOSIS — I10 Essential (primary) hypertension: Secondary | ICD-10-CM | POA: Diagnosis not present

## 2015-01-16 DIAGNOSIS — N183 Chronic kidney disease, stage 3 (moderate): Secondary | ICD-10-CM | POA: Diagnosis not present

## 2015-01-16 LAB — POCT INFLUENZA A/B
Influenza A, POC: NEGATIVE
Influenza B, POC: NEGATIVE

## 2015-01-16 MED ORDER — ONDANSETRON HCL 4 MG PO TABS: 4.0000 mg | ORAL_TABLET | Freq: Four times a day (QID) | ORAL | Status: DC | PRN

## 2015-01-16 NOTE — Progress Notes (Signed)
Subjective:    Patient ID: Justin Walters, male    DOB: 13-Mar-1940, 75 y.o.   MRN: [id number]  HPI  Here with acute onset 3 days ago of sudden mult episodes of n/v , crampy mild abd pain, and watery diarrhea stools for about 12 hrs overnight the night before last, and all now seemed to be much better but still with marked fatigue, HA, feeling poorly in general with intermittent nausea.  Could not take meds this am due to thinking he may thrown them up.  Pt denies chest pain, increased sob or doe, wheezing, orthopnea, PND, increased LE swelling, palpitations, dizziness or syncope.  Pt denies new neurological symptoms such as new headache, or facial or extremity weakness or numbness   Pt denies polydipsia, polyuria, Has been able to keep down small sips of fluids Past Medical History  Diagnosis Date  . Hypertension   . Hyperlipidemia   . Diverticulosis of colon   . BPH (benign prostatic hyperplasia)   . Bladder stone   . Dysuria   . History of kidney stones   . Glucose intolerance (impaired glucose tolerance)   . At risk for sleep apnea     STOP-BANG= 5    SENT TO PCP 06-13-2013  . COPD (chronic obstructive pulmonary disease) [facility])    Past Surgical History  Procedure Laterality Date  . Cataract extraction w/ intraocular lens  implant, bilateral    . Laparoscopic inguinal hernia repair Bilateral 10-09-2003  . Appendectomy  1980's  . Transurethral resection of prostate N/A 06/23/2013    Procedure: TRANSURETHRAL RESECTION OF THE PROSTATE;  Surgeon: Arvil Persons, MD;  Location[facility];  Service: Urology;  Laterality: N/A;  . Cystoscopy N/A 06/23/2013    Procedure: CYSTOSCOPY WITH LITHOLAPAXY, BLADDER EXPLORATION, REPAIR BLADDER PERFORATION;  Surgeon: Arvil Persons, MD;  Location: [place];  Service: Urology;  Laterality: N/A;  . Holmium laser application N/A [id number]    Procedure: HOLMIUM LASER APPLICATION;  Surgeon: Arvil Persons, MD;  Location: [facility];  Service: Urology;  Laterality: N/A;  . Colonoscopy  10-05-2003    tics only     reports that he quit smoking about 22 years ago. His smoking use included Cigarettes. He has a 102 pack-year smoking history. He has never used smokeless tobacco. He reports that he drinks about 2.4 oz of alcohol per week. He reports that he does not use illicit drugs. family history includes COPD in his mother; Cancer in his father; Emphysema in his brother; Heart failure in his brother. There is no history of Colon cancer, Esophageal cancer, Rectal cancer, or Stomach cancer. No Known Allergies Current Outpatient Prescriptions on File Prior to Visit  Medication Sig Dispense Refill  . albuterol (PROVENTIL HFA;VENTOLIN HFA) 108 (90 BASE) MCG/ACT inhaler Inhale 2 puffs into the lungs every 6 (six) hours as needed for wheezing or shortness of breath. 1 Inhaler 11  . aspirin 81 MG EC tablet Take 81 mg by mouth daily. Swallow whole.    . carvedilol (COREG) 12.5 MG tablet Take 1 tablet (12.5 mg total) by mouth 2 (two) times daily with a meal. 180 tablet 3  . gabapentin (NEURONTIN) 100 MG capsule 2 tabs by mouth three times per day 180 capsule 5  . hydrochlorothiazide (HYDRODIURIL) 25 MG tablet Take 1 tablet (25 mg total) by mouth daily. (Patient taking differently: Take 12.5 mg by mouth daily. Takes 1/2 pill daily.) 90 tablet 3  . Multiple  Vitamin (MULTIVITAMIN) tablet Take 1 tablet by mouth daily.    . rosuvastatin (CRESTOR) 20 MG tablet Take 1 tablet (20 mg total) by mouth daily. 90 tablet 3  . terazosin (HYTRIN) 10 MG capsule Take 1 capsule (10 mg total) by mouth at bedtime. 90 capsule 3  . tiotropium (SPIRIVA HANDIHALER) 18 MCG inhalation capsule Place 1 capsule (18 mcg total) into inhaler and inhale daily. 30 capsule 12   No current facility-administered medications on file prior to visit.   Review of Systems  Constitutional: Negative for unusual diaphoresis or night sweats HENT: Negative for ringing  in ear or discharge Eyes: Negative for double vision or worsening visual disturbance.  Respiratory: Negative for choking and stridor.   Gastrointestinal: Negative for vomiting or other signifcant bowel change Genitourinary: Negative for hematuria or change in urine volume.  Musculoskeletal: Negative for other MSK pain or swelling Skin: Negative for color change and worsening wound.  Neurological: Negative for tremors and numbness other than noted  Psychiatric/Behavioral: Negative for decreased concentration or agitation other than above       Objective:   Physical Exam BP 114/76 mmHg  Pulse 82  Temp(Src) 98.8 F (37.1 C) (Oral)  Resp 18  Wt 165 lb (74.844 kg)  SpO2 95% VS noted, mild ill Constitutional: Pt appears in no significant distress HENT: Head: NCAT.  Right Ear: External ear normal.  Left Ear: External ear normal.  Bilat tm's without erythema.  Max sinus areas non tender.  Pharynx with mild erythema, no exudate Eyes: . Pupils are equal, round, and reactive to light. Conjunctivae and EOM are normal Neck: Normal range of motion. Neck supple. no neck LA Cardiovascular: Normal rate and regular rhythm.   Pulmonary/Chest: Effort normal and breath sounds without rales or wheezing.  Abd:  Soft, NT, ND, + BS - benign exam Neurological: Pt is alert. Not confused , motor grossly intact Skin: Skin is warm. No rash, no LE edema Psychiatric: Pt behavior is normal. No agitation.   Influenza screen - negative    Assessment & Plan:

## 2015-01-16 NOTE — Assessment & Plan Note (Signed)
Acute, likely viral, influenza screen neg, for incresae oral fluids, tylenol prn, rest, supportive care, also zofran prn nausea

## 2015-01-16 NOTE — Assessment & Plan Note (Signed)
Ok to hold on the HCT until improved, o/w stable overall by history and exam, recent data reviewed with pt, and pt to continue medical treatment as before,  to f/u any worsening symptoms or concerns BP Readings from Last 3 Encounters:  01/16/15 114/76  12/16/14 90/62  11/17/14 110/68

## 2015-01-16 NOTE — Assessment & Plan Note (Signed)
At risk for AKD on CKD -  Consider labs if not able to take po well in the next 2-3 days

## 2015-01-16 NOTE — Patient Instructions (Signed)
Please take all new medication as prescribed - the nausea medication as needed  Please drink constant small sips of fluids during the day until improved, such as gatorade or pedialyte  You can also take Mucinex (or it's generic off brand) for congestion, and tylenol as needed for pain.  You should hold on taking any Alleve or advil for pain due to your history of kidney slowing  OK to HOLD on taking the HCT fluid pill until you are improved, to avoid worsening dehydration  Please continue all other medications as before, and refills have been done if requested.  Please have the pharmacy call with any other refills you may need.  Please keep your appointments with your specialists as you may have planned

## 2015-01-16 NOTE — Progress Notes (Signed)
Pre visit review using our clinic review tool, if applicable. No additional management support is needed unless otherwise documented below in the visit note. 

## 2015-04-07 ENCOUNTER — Encounter: Payer: Self-pay | Admitting: Internal Medicine

## 2015-04-07 ENCOUNTER — Ambulatory Visit (INDEPENDENT_AMBULATORY_CARE_PROVIDER_SITE_OTHER): Payer: Commercial Managed Care - HMO | Admitting: Internal Medicine

## 2015-04-07 VITALS — BP 116/60 | HR 78 | Temp 98.9°F | Resp 20 | Wt 163.0 lb

## 2015-04-07 DIAGNOSIS — R7302 Impaired glucose tolerance (oral): Secondary | ICD-10-CM | POA: Diagnosis not present

## 2015-04-07 DIAGNOSIS — R059 Cough, unspecified: Secondary | ICD-10-CM | POA: Insufficient documentation

## 2015-04-07 DIAGNOSIS — I10 Essential (primary) hypertension: Secondary | ICD-10-CM | POA: Diagnosis not present

## 2015-04-07 DIAGNOSIS — J441 Chronic obstructive pulmonary disease with (acute) exacerbation: Secondary | ICD-10-CM | POA: Insufficient documentation

## 2015-04-07 DIAGNOSIS — R05 Cough: Secondary | ICD-10-CM | POA: Diagnosis not present

## 2015-04-07 MED ORDER — HYDROCODONE-HOMATROPINE 5-1.5 MG/5ML PO SYRP: 5.0000 mL | ORAL_SOLUTION | Freq: Four times a day (QID) | ORAL | Status: DC | PRN

## 2015-04-07 MED ORDER — PREDNISONE 10 MG PO TABS: ORAL_TABLET | ORAL | Status: DC

## 2015-04-07 MED ORDER — LEVOFLOXACIN 250 MG PO TABS
250.0000 mg | ORAL_TABLET | Freq: Every day | ORAL | Status: DC
Start: 2015-04-07 — End: 2015-05-18

## 2015-04-07 NOTE — Progress Notes (Signed)
Pre visit review using our clinic review tool, if applicable. No additional management support is needed unless otherwise documented below in the visit note. 

## 2015-04-07 NOTE — Patient Instructions (Signed)
Please take all new medication as prescribed  - the antibiotic, prednisone, and cough medicine if needed  Please continue all other medications as before, and refills have been done if requested.  Please have the pharmacy call with any other refills you may need.  Please keep your appointments with your specialists as you may have planned   

## 2015-04-07 NOTE — Progress Notes (Signed)
Subjective:    Patient ID: Justin Walters, male    DOB: 1940-12-27, 75 y.o.   MRN: [id number]  HPI   Here with acute onset mild to mod 2-3 days ST, HA, general weakness and malaise, with prod cough greenish sputum, but Pt denies chest pain, increased sob or doe, wheezing, orthopnea, PND, increased LE swelling, palpitations, dizziness or syncope, except for onset mild wheezing/sob since last PM.  Pt denies new neurological symptoms such as new headache, or facial or extremity weakness or numbness   Pt denies polydipsia, polyuria Past Medical History  Diagnosis Date  . Hypertension   . Hyperlipidemia   . Diverticulosis of colon   . BPH (benign prostatic hyperplasia)   . Bladder stone   . Dysuria   . History of kidney stones   . Glucose intolerance (impaired glucose tolerance)   . At risk for sleep apnea     STOP-BANG= 5    SENT TO PCP 06-13-2013  . COPD (chronic obstructive pulmonary disease) [facility])    Past Surgical History  Procedure Laterality Date  . Cataract extraction w/ intraocular lens  implant, bilateral    . Laparoscopic inguinal hernia repair Bilateral 10-09-2003  . Appendectomy  1980's  . Transurethral resection of prostate N/A 06/23/2013    Procedure: TRANSURETHRAL RESECTION OF THE PROSTATE;  Surgeon: Arvil Persons, MD;  Location: [facility];  Service: Urology;  Laterality: N/A;  . Cystoscopy N/A 06/23/2013    Procedure: CYSTOSCOPY WITH LITHOLAPAXY, BLADDER EXPLORATION, REPAIR BLADDER PERFORATION;  Surgeon: Arvil Persons, MD;  Location: [place];  Service: Urology;  Laterality: N/A;  . Holmium laser application N/A [id number]    Procedure: HOLMIUM LASER APPLICATION;  Surgeon: Arvil Persons, MD;  Location: [facility];  Service: Urology;  Laterality: N/A;  . Colonoscopy  10-05-2003    tics only     reports that he quit smoking about 22 years ago. His smoking use included Cigarettes. He has a 102 pack-year smoking history. He has never  used smokeless tobacco. He reports that he drinks about 2.4 oz of alcohol per week. He reports that he does not use illicit drugs. family history includes COPD in his mother; Cancer in his father; Emphysema in his brother; Heart failure in his brother. There is no history of Colon cancer, Esophageal cancer, Rectal cancer, or Stomach cancer. No Known Allergies Current Outpatient Prescriptions on File Prior to Visit  Medication Sig Dispense Refill  . albuterol (PROVENTIL HFA;VENTOLIN HFA) 108 (90 BASE) MCG/ACT inhaler Inhale 2 puffs into the lungs every 6 (six) hours as needed for wheezing or shortness of breath. 1 Inhaler 11  . aspirin 81 MG EC tablet Take 81 mg by mouth daily. Swallow whole.    . carvedilol (COREG) 12.5 MG tablet Take 1 tablet (12.5 mg total) by mouth 2 (two) times daily with a meal. 180 tablet 3  . gabapentin (NEURONTIN) 100 MG capsule 2 tabs by mouth three times per day 180 capsule 5  . hydrochlorothiazide (HYDRODIURIL) 25 MG tablet Take 1 tablet (25 mg total) by mouth daily. (Patient taking differently: Take 12.5 mg by mouth daily. Takes 1/2 pill daily.) 90 tablet 3  . Multiple Vitamin (MULTIVITAMIN) tablet Take 1 tablet by mouth daily.    . ondansetron (ZOFRAN) 4 MG tablet Take 1 tablet (4 mg total) by mouth 4 (four) times daily as needed for nausea or vomiting. 30 tablet 1  . rosuvastatin (CRESTOR) 20 MG tablet Take 1  tablet (20 mg total) by mouth daily. 90 tablet 3  . terazosin (HYTRIN) 10 MG capsule Take 1 capsule (10 mg total) by mouth at bedtime. 90 capsule 3  . tiotropium (SPIRIVA HANDIHALER) 18 MCG inhalation capsule Place 1 capsule (18 mcg total) into inhaler and inhale daily. 30 capsule 12   No current facility-administered medications on file prior to visit.   Review of Systems  Constitutional: Negative for unusual diaphoresis or night sweats HENT: Negative for ear swelling or discharge Eyes: Negative for worsening visual haziness  Respiratory: Negative for  choking and stridor.   Gastrointestinal: Negative for distension or worsening eructation Genitourinary: Negative for retention or change in urine volume.  Musculoskeletal: Negative for other MSK pain or swelling Skin: Negative for color change and worsening wound Neurological: Negative for tremors and numbness other than noted  Psychiatric/Behavioral: Negative for decreased concentration or agitation other than above       Objective:   Physical Exam BP 116/60 mmHg  Pulse 78  Temp(Src) 98.9 F (37.2 C) (Oral)  Resp 20  Wt 163 lb (73.936 kg)  SpO2 94% VS noted, mild ill Constitutional: Pt appears in no apparent distress HENT: Head: NCAT.  Right Ear: External ear normal.  Left Ear: External ear normal.  Bilat tm's with mild erythema.  Max sinus areas non tender.  Pharynx with mild erythema, no exudate Eyes: . Pupils are equal, round, and reactive to light. Conjunctivae and EOM are normal Neck: Normal range of motion. Neck supple.  Cardiovascular: Normal rate and regular rhythm.   Pulmonary/Chest: Effort normal and breath sounds without rales but with bilat mild rhonchi and  wheezing.  Neurological: Pt is alert. Not confused , motor grossly intact Skin: Skin is warm. No rash, no LE edema Psychiatric: Pt behavior is normal. No agitation.     Assessment & Plan:

## 2015-04-08 NOTE — Assessment & Plan Note (Signed)
/  Mild to mod, for predpac asd,  to f/u any worsening symptoms or concerns 

## 2015-04-08 NOTE — Assessment & Plan Note (Signed)
Mild to mod, c/w bronchitis vs pna, for cxr, also for antibx course, cough med prn,  to f/u any worsening symptoms or concerns 

## 2015-04-08 NOTE — Assessment & Plan Note (Signed)
stable overall by history and exam, recent data reviewed with pt, and pt to continue medical treatment as before,  to f/u any worsening symptoms or concerns Lab Results  Component Value Date   HGBA1C 5.7 05/15/2014   Pt to f/u with any onset polys or cbg > 200 with illness

## 2015-04-08 NOTE — Assessment & Plan Note (Signed)
stable overall by history and exam, recent data reviewed with pt, and pt to continue medical treatment as before,  to f/u any worsening symptoms or concerns BP Readings from Last 3 Encounters:  04/07/15 116/60  01/16/15 114/76  12/16/14 90/62

## 2015-04-12 ENCOUNTER — Ambulatory Visit: Payer: Commercial Managed Care - HMO | Admitting: Neurology

## 2015-04-12 DIAGNOSIS — Z029 Encounter for administrative examinations, unspecified: Secondary | ICD-10-CM

## 2015-04-16 ENCOUNTER — Ambulatory Visit: Payer: Commercial Managed Care - HMO | Admitting: Neurology

## 2015-04-21 ENCOUNTER — Other Ambulatory Visit (INDEPENDENT_AMBULATORY_CARE_PROVIDER_SITE_OTHER): Payer: Commercial Managed Care - HMO

## 2015-04-21 ENCOUNTER — Encounter: Payer: Self-pay | Admitting: Internal Medicine

## 2015-04-21 DIAGNOSIS — Z0000 Encounter for general adult medical examination without abnormal findings: Secondary | ICD-10-CM

## 2015-04-21 DIAGNOSIS — N99111 Postprocedural bulbous urethral stricture: Secondary | ICD-10-CM | POA: Diagnosis not present

## 2015-04-21 DIAGNOSIS — R7302 Impaired glucose tolerance (oral): Secondary | ICD-10-CM

## 2015-04-21 DIAGNOSIS — N400 Enlarged prostate without lower urinary tract symptoms: Secondary | ICD-10-CM | POA: Diagnosis not present

## 2015-04-21 DIAGNOSIS — R300 Dysuria: Secondary | ICD-10-CM | POA: Diagnosis not present

## 2015-04-21 DIAGNOSIS — Z0189 Encounter for other specified special examinations: Secondary | ICD-10-CM

## 2015-04-21 LAB — URINALYSIS, ROUTINE W REFLEX MICROSCOPIC
Bilirubin Urine: NEGATIVE
Hgb urine dipstick: NEGATIVE
KETONES UR: NEGATIVE
Leukocytes, UA: NEGATIVE
NITRITE: NEGATIVE
RBC / HPF: NONE SEEN (ref 0–?)
SPECIFIC GRAVITY, URINE: 1.02 (ref 1.000–1.030)
TOTAL PROTEIN, URINE-UPE24: NEGATIVE
URINE GLUCOSE: NEGATIVE
UROBILINOGEN UA: 0.2 (ref 0.0–1.0)
pH: 6.5 (ref 5.0–8.0)

## 2015-04-21 LAB — LIPID PANEL
CHOL/HDL RATIO: 3
CHOLESTEROL: 151 mg/dL (ref 0–200)
HDL: 45.9 mg/dL (ref >39.00)
LDL Cholesterol: 82 mg/dL (ref 0–99)
NonHDL: 105.1
TRIGLYCERIDES: 115 mg/dL (ref 0.0–149.0)
VLDL: 23 mg/dL (ref 0.0–40.0)

## 2015-04-21 LAB — CBC WITH DIFFERENTIAL/PLATELET
BASOS ABS: 0.1 10*3/uL (ref 0.0–0.1)
BASOS PCT: 0.8 % (ref 0.0–3.0)
EOS ABS: 0.4 10*3/uL (ref 0.0–0.7)
Eosinophils Relative: 4.5 % (ref 0.0–5.0)
HCT: 46.8 % (ref 39.0–52.0)
HEMOGLOBIN: 16 g/dL (ref 13.0–17.0)
Lymphocytes Relative: 16.8 % (ref 12.0–46.0)
Lymphs Abs: 1.3 10*3/uL (ref 0.7–4.0)
MCHC: 34.2 g/dL (ref 30.0–36.0)
MCV: 87.4 fl (ref 78.0–100.0)
MONO ABS: 0.9 10*3/uL (ref 0.1–1.0)
Monocytes Relative: 10.9 % (ref 3.0–12.0)
Neutro Abs: 5.3 10*3/uL (ref 1.4–7.7)
Neutrophils Relative %: 67 % (ref 43.0–77.0)
PLATELETS: 184 10*3/uL (ref 150.0–400.0)
RBC: 5.35 Mil/uL (ref 4.22–5.81)
RDW: 13.4 % (ref 11.5–15.5)
WBC: 7.9 10*3/uL (ref 4.0–10.5)

## 2015-04-21 LAB — HEPATIC FUNCTION PANEL
ALT: 16 U/L (ref 0–53)
AST: 20 U/L (ref 0–37)
Albumin: 4.3 g/dL (ref 3.5–5.2)
Alkaline Phosphatase: 41 U/L (ref 39–117)
BILIRUBIN TOTAL: 0.8 mg/dL (ref 0.2–1.2)
Bilirubin, Direct: 0.2 mg/dL (ref 0.0–0.3)
TOTAL PROTEIN: 6.8 g/dL (ref 6.0–8.3)

## 2015-04-21 LAB — HEMOGLOBIN A1C: HEMOGLOBIN A1C: 6.1 % (ref 4.6–6.5)

## 2015-04-21 LAB — BASIC METABOLIC PANEL
BUN: 28 mg/dL — AB (ref 6–23)
CHLORIDE: 98 meq/L (ref 96–112)
CO2: 39 meq/L — AB (ref 19–32)
CREATININE: 1.48 mg/dL (ref 0.40–1.50)
Calcium: 9.8 mg/dL (ref 8.4–10.5)
GFR: 49.36 mL/min — ABNORMAL LOW (ref >60.00)
GLUCOSE: 117 mg/dL — AB (ref 70–99)
POTASSIUM: 4.6 meq/L (ref 3.5–5.1)
Sodium: 142 mEq/L (ref 135–145)

## 2015-04-21 LAB — TSH: TSH: 1.89 u[IU]/mL (ref 0.35–4.50)

## 2015-04-21 LAB — PSA: PSA: 1 ng/mL (ref 0.10–4.00)

## 2015-05-18 ENCOUNTER — Encounter: Payer: Self-pay | Admitting: Internal Medicine

## 2015-05-18 ENCOUNTER — Ambulatory Visit (INDEPENDENT_AMBULATORY_CARE_PROVIDER_SITE_OTHER): Payer: Commercial Managed Care - HMO | Admitting: Internal Medicine

## 2015-05-18 ENCOUNTER — Other Ambulatory Visit (INDEPENDENT_AMBULATORY_CARE_PROVIDER_SITE_OTHER): Payer: Commercial Managed Care - HMO

## 2015-05-18 ENCOUNTER — Telehealth: Payer: Self-pay | Admitting: Internal Medicine

## 2015-05-18 VITALS — BP 96/60 | HR 95 | Resp 20 | Wt 160.0 lb

## 2015-05-18 DIAGNOSIS — E785 Hyperlipidemia, unspecified: Secondary | ICD-10-CM | POA: Diagnosis not present

## 2015-05-18 DIAGNOSIS — R5381 Other malaise: Secondary | ICD-10-CM | POA: Diagnosis not present

## 2015-05-18 DIAGNOSIS — I10 Essential (primary) hypertension: Secondary | ICD-10-CM | POA: Diagnosis not present

## 2015-05-18 DIAGNOSIS — N390 Urinary tract infection, site not specified: Secondary | ICD-10-CM | POA: Diagnosis not present

## 2015-05-18 DIAGNOSIS — R51 Headache: Secondary | ICD-10-CM

## 2015-05-18 DIAGNOSIS — N210 Calculus in bladder: Secondary | ICD-10-CM | POA: Diagnosis not present

## 2015-05-18 DIAGNOSIS — R6889 Other general symptoms and signs: Secondary | ICD-10-CM

## 2015-05-18 DIAGNOSIS — Z0001 Encounter for general adult medical examination with abnormal findings: Secondary | ICD-10-CM

## 2015-05-18 DIAGNOSIS — R300 Dysuria: Secondary | ICD-10-CM

## 2015-05-18 DIAGNOSIS — R519 Headache, unspecified: Secondary | ICD-10-CM

## 2015-05-18 DIAGNOSIS — J449 Chronic obstructive pulmonary disease, unspecified: Secondary | ICD-10-CM | POA: Diagnosis not present

## 2015-05-18 DIAGNOSIS — Z0000 Encounter for general adult medical examination without abnormal findings: Secondary | ICD-10-CM | POA: Diagnosis not present

## 2015-05-18 LAB — BASIC METABOLIC PANEL
BUN: 29 mg/dL — AB (ref 6–23)
CALCIUM: 9.3 mg/dL (ref 8.4–10.5)
CO2: 26 mEq/L (ref 19–32)
Chloride: 93 mEq/L — ABNORMAL LOW (ref 96–112)
Creatinine, Ser: 1.63 mg/dL — ABNORMAL HIGH (ref 0.40–1.50)
GFR: 44.14 mL/min — AB (ref >60.00)
GLUCOSE: 99 mg/dL (ref 70–99)
Potassium: 3.3 mEq/L — ABNORMAL LOW (ref 3.5–5.1)
SODIUM: 136 meq/L (ref 135–145)

## 2015-05-18 LAB — URINALYSIS, ROUTINE W REFLEX MICROSCOPIC
KETONES UR: 15 — AB
Nitrite: POSITIVE — AB
SPECIFIC GRAVITY, URINE: 1.025 (ref 1.000–1.030)
TOTAL PROTEIN, URINE-UPE24: 100 — AB
URINE GLUCOSE: NEGATIVE
UROBILINOGEN UA: 0.2 (ref 0.0–1.0)
pH: 5.5 (ref 5.0–8.0)

## 2015-05-18 LAB — CBC WITH DIFFERENTIAL/PLATELET
BASOS PCT: 0.2 % (ref 0.0–3.0)
Basophils Absolute: 0 10*3/uL (ref 0.0–0.1)
EOS PCT: 0.1 % (ref 0.0–5.0)
Eosinophils Absolute: 0 10*3/uL (ref 0.0–0.7)
HCT: 44.1 % (ref 39.0–52.0)
HEMOGLOBIN: 15.1 g/dL (ref 13.0–17.0)
Lymphocytes Relative: 7.7 % — ABNORMAL LOW (ref 12.0–46.0)
Lymphs Abs: 0.7 10*3/uL (ref 0.7–4.0)
MCHC: 34.4 g/dL (ref 30.0–36.0)
MCV: 87.1 fl (ref 78.0–100.0)
MONO ABS: 0.8 10*3/uL (ref 0.1–1.0)
Monocytes Relative: 10 % (ref 3.0–12.0)
NEUTROS ABS: 7 10*3/uL (ref 1.4–7.7)
Neutrophils Relative %: 82 % — ABNORMAL HIGH (ref 43.0–77.0)
PLATELETS: 181 10*3/uL (ref 150.0–400.0)
RBC: 5.06 Mil/uL (ref 4.22–5.81)
RDW: 13.3 % (ref 11.5–15.5)
WBC: 8.5 10*3/uL (ref 4.0–10.5)

## 2015-05-18 MED ORDER — GABAPENTIN 300 MG PO CAPS: 300.0000 mg | ORAL_CAPSULE | Freq: Three times a day (TID) | ORAL | Status: DC

## 2015-05-18 MED ORDER — SILDENAFIL CITRATE 100 MG PO TABS: 50.0000 mg | ORAL_TABLET | Freq: Every day | ORAL | Status: DC | PRN

## 2015-05-18 MED ORDER — LEVOFLOXACIN 500 MG PO TABS
500.0000 mg | ORAL_TABLET | Freq: Every day | ORAL | Status: DC
Start: 2015-05-18 — End: 2015-05-21

## 2015-05-18 NOTE — Assessment & Plan Note (Signed)
stable overall by history and exam, recent data reviewed with pt, and pt to continue medical treatment as before,  to f/u any worsening symptoms or concerns Lab Results  Component Value Date   LDLCALC 82 04/21/2015

## 2015-05-18 NOTE — Telephone Encounter (Signed)
Pt called in and would like for Dr Jenny Reichmann to call in [place]  for him to [place], pharmacy on file

## 2015-05-18 NOTE — Patient Instructions (Signed)
Please take all new medication as prescribed - the levaquin if OK with urology later today  OK to increase the gabapentin to 300 mg three times per day  Please hold on taking the HCT fluid pill for BP for at least 1 week, and return for BP check if not feeling improved  Please continue all other medications as before, and refills have been done if requested.  Please have the pharmacy call with any other refills you may need.  Please continue your efforts at being more active, low cholesterol diet, and weight control.  You are otherwise up to date with prevention measures today.  Please keep your appointments with your specialists as you may have planned  Please go to the LAB in the Basement (turn left off the elevator) for the tests to be done today  You will be contacted by phone if any changes need to be made immediately.  Otherwise, you will receive a letter about your results with an explanation, but please check with MyChart first.  Please remember to sign up for MyChart if you have not done so, as this will be important to you in the future with finding out test results, communicating by private email, and scheduling acute appointments online when needed.  Please return in 6 months, or sooner if needed

## 2015-05-18 NOTE — Assessment & Plan Note (Signed)
Borderline low today, possibly causing dizziness, ok to hold for 1 wk or until feeling improved, cont coreg, to f/u any worsening symptoms or concerns BP Readings from Last 3 Encounters:  05/18/15 96/60  04/07/15 116/60  01/16/15 114/76

## 2015-05-18 NOTE — Telephone Encounter (Signed)
Done erx 

## 2015-05-18 NOTE — Progress Notes (Signed)
Subjective:    Patient ID: Justin Walters, male    DOB: [birth year], 75 y.o.   MRN: [id number]  HPI  Here for wellness and f/u;  Overall doing ok;   Pt denies polydipsia, polyuria, or low sugar symptoms. Pt states overall good compliance with treatment and medications, good tolerability, and has been trying to follow appropriate diet.  Pt denies worsening depressive symptoms, suicidal ideation or panic. No fever, night sweats, wt loss, loss of appetite, or other constitutional symptoms.  Pt states good ability with ADL's, has low fall risk, home safety reviewed and adequate, no other significant changes in hearing or vision, and only occasionally active with exercise. Incidentally has an acute illness since 3 days, with feeling weak and poorly, decreased energy, doe at 50 ft, several dry heaves 2 days ago, and dizziness.  No fever, cough, no CP, wheezing.  Pt denies chest pain, orthopnea, PND, increased LE swelling, palpitations, or syncope or sob at rest, but has DOE with exertion at aobut 50 ft, but could walk 100 ft or more if had to.  Asks for increased gabapentin for HA as current dose not working, and neurologist suggested increased dose might be needed. Thinks he may have a kidney stone in the bladder since it hurts with urination, but Denies urinary symptoms such as frequency, urgency, flank pain (but does have occas lower left back pain), hematuria or fever, chills. Coincidently has urology f/u at 1PM today, has had significant bladder issues for 2 yrs.  Note: BP lower today, does not have ability to take BP at home Past Medical History  Diagnosis Date  . Hypertension   . Hyperlipidemia   . Diverticulosis of colon   . BPH (benign prostatic hyperplasia)   . Bladder stone   . Dysuria   . History of kidney stones   . Glucose intolerance (impaired glucose tolerance)   . At risk for sleep apnea     STOP-BANG= 5    SENT TO PCP 06-13-2013  . COPD (chronic obstructive pulmonary disease) [facility])     Past Surgical History  Procedure Laterality Date  . Cataract extraction w/ intraocular lens  implant, bilateral    . Laparoscopic inguinal hernia repair Bilateral 10-09-2003  . Appendectomy  1980's  . Transurethral resection of prostate N/A 06/23/2013    Procedure: TRANSURETHRAL RESECTION OF THE PROSTATE;  Surgeon: Arvil Persons, MD;  Location: [facility];  Service: Urology;  Laterality: N/A;  . Cystoscopy N/A 06/23/2013    Procedure: CYSTOSCOPY WITH LITHOLAPAXY, BLADDER EXPLORATION, REPAIR BLADDER PERFORATION;  Surgeon: Arvil Persons, MD;  Location: [place];  Service: Urology;  Laterality: N/A;  . Holmium laser application N/A [id number]    Procedure: HOLMIUM LASER APPLICATION;  Surgeon: Arvil Persons, MD;  Location: [facility];  Service: Urology;  Laterality: N/A;  . Colonoscopy  10-05-2003    tics only     reports that he quit smoking about 22 years ago. His smoking use included Cigarettes. He has a 102 pack-year smoking history. He has never used smokeless tobacco. He reports that he drinks about 2.4 oz of alcohol per week. He reports that he does not use illicit drugs. family history includes COPD in his mother; Cancer in his father; Emphysema in his brother; Heart failure in his brother. There is no history of Colon cancer, Esophageal cancer, Rectal cancer, or Stomach cancer. No Known Allergies Current Outpatient Prescriptions on File Prior to Visit  Medication Sig Dispense  Refill  . albuterol (PROVENTIL HFA;VENTOLIN HFA) 108 (90 BASE) MCG/ACT inhaler Inhale 2 puffs into the lungs every 6 (six) hours as needed for wheezing or shortness of breath. 1 Inhaler 11  . aspirin 81 MG EC tablet Take 81 mg by mouth daily. Swallow whole.    . carvedilol (COREG) 12.5 MG tablet Take 1 tablet (12.5 mg total) by mouth 2 (two) times daily with a meal. 180 tablet 3  . hydrochlorothiazide (HYDRODIURIL) 25 MG tablet Take 1 tablet (25 mg total) by mouth daily.  (Patient taking differently: Take 12.5 mg by mouth daily. Takes 1/2 pill daily.) 90 tablet 3  . Multiple Vitamin (MULTIVITAMIN) tablet Take 1 tablet by mouth daily.    . rosuvastatin (CRESTOR) 20 MG tablet Take 1 tablet (20 mg total) by mouth daily. 90 tablet 3  . terazosin (HYTRIN) 10 MG capsule Take 1 capsule (10 mg total) by mouth at bedtime. 90 capsule 3  . tiotropium (SPIRIVA HANDIHALER) 18 MCG inhalation capsule Place 1 capsule (18 mcg total) into inhaler and inhale daily. 30 capsule 12   No current facility-administered medications on file prior to visit.   Review of Systems Constitutional: Negative for increased diaphoresis, or other activity, appetite or siginficant weight change other than noted HENT: Negative for worsening hearing loss, ear pain, facial swelling, mouth sores and neck stiffness.   Eyes: Negative for other worsening pain, redness or visual disturbance.  Respiratory: Negative for choking or stridor Cardiovascular: Negative for other chest pain and palpitations.  Gastrointestinal: Negative for worsening diarrhea, blood in stool, or abdominal distention Genitourinary: Negative for hematuria, flank pain or change in urine volume.  Musculoskeletal: Negative for myalgias or other joint complaints.  Skin: Negative for other color change and wound or drainage.  Neurological: Negative for syncope and numbness. other than noted Hematological: Negative for adenopathy. or other swelling Psychiatric/Behavioral: Negative for hallucinations, SI, self-injury, decreased concentration or other worsening agitation.      Objective:   Physical Exam BP 96/60 mmHg  Pulse 95  Resp 20  Wt 160 lb (72.576 kg)  SpO2 94% VS noted,  Constitutional: Pt is oriented to person, place, and time. Appears well-developed and well-nourished, in no significant distress Head: Normocephalic and atraumatic  Eyes: Conjunctivae and EOM are normal. Pupils are equal, round, and reactive to light Right  Ear: External ear normal.  Left Ear: External ear normal Nose: Nose normal.  Mouth/Throat: Oropharynx is clear and moist  Neck: Normal range of motion. Neck supple. No JVD present. No tracheal deviation present or significant neck LA or mass Cardiovascular: Normal rate, regular rhythm, normal heart sounds and intact distal pulses.   Pulmonary/Chest: Effort normal and breath sounds without rales or wheezing  Abdominal: Soft. Bowel sounds are normal. NT. No HSM , no flank tender, delcines DRE, has urology appt later today Musculoskeletal: Normal range of motion. Exhibits no edema Lymphadenopathy: Has no cervical adenopathy.  Neurological: Pt is alert and oriented to person, place, and time. Pt has normal reflexes. No cranial nerve deficit. Motor grossly intact Skin: Skin is warm and dry. No rash noted or new ulcers Psychiatric:  Has normal mood and affect. Behavior is normal.  Lab Results  Component Value Date   WBC 7.9 04/21/2015   HGB 16.0 04/21/2015   HCT 46.8 04/21/2015   PLT 184.0 04/21/2015   GLUCOSE 117* 04/21/2015   CHOL 151 04/21/2015   TRIG 115.0 04/21/2015   HDL 45.90 04/21/2015   LDLDIRECT 161.9 03/28/2012   LDLCALC 82 04/21/2015  ALT 16 04/21/2015   AST 20 04/21/2015   NA 142 04/21/2015   K 4.6 04/21/2015   CL 98 04/21/2015   CREATININE 1.48 04/21/2015   BUN 28* 04/21/2015   CO2 39* 04/21/2015   TSH 1.89 04/21/2015   PSA 1.00 04/21/2015   HGBA1C 6.1 04/21/2015     Ref Range 3wk ago (04/21/15) 86yr ago (05/15/14) 20yr ago (08/04/13) 60yr ago (08/04/13)     Color, Urine Yellow;Lt. Yellow  YELLOW YELLOW  ORANGE (A)R, CM    APPearance Clear  CLEAR CLEAR  CLOUDY (A)R    Specific Gravity, Urine 1.000-1.030  1.020 1.020  1.013R    pH 5.0 - 8.0  6.5 6.0  6.0    Total Protein, Urine Negative  NEGATIVE NEGATIVE      Urine Glucose Negative  NEGATIVE NEGATIVE      Ketones, ur Negative  NEGATIVE NEGATIVE  NEGATIVER    Bilirubin Urine Negative  NEGATIVE NEGATIVE   NEGATIVER    Hgb urine dipstick Negative  NEGATIVE NEGATIVE  MODERATE (A)R    Urobilinogen, UA 0.0 - 1.0  0.2 0.2  1.0R    Leukocytes, UA Negative  NEGATIVE NEGATIVE  LARGE (A)R    Nitrite Negative  NEGATIVE NEGATIVE  POSITIVE (A)R    WBC, UA 0-2/hpf  0-2/hpf none seen 7-10R     RBC / HPF 0-2/hpf  none seen none seen 7-10R     Squamous Epithelial / LPF Rare(0-4/hpf)  Rare(0-4/hpf)               Assessment & Plan:

## 2015-05-18 NOTE — Assessment & Plan Note (Signed)
stable overall by history and exam, recent data reviewed with pt, and pt to continue medical treatment as before,  to f/u any worsening symptoms or concerns SpO2 Readings from Last 3 Encounters:  05/18/15 94%  04/07/15 94%  01/16/15 95%

## 2015-05-18 NOTE — Assessment & Plan Note (Signed)
With neuritic pain mostly at crown of head, suboptimal control, ok to increased gabapentin to 300 tid, consider 400 tid if needed

## 2015-05-18 NOTE — Telephone Encounter (Signed)
Please advise 

## 2015-05-18 NOTE — Assessment & Plan Note (Signed)

## 2015-05-18 NOTE — Progress Notes (Signed)
Pre visit review using our clinic review tool, if applicable. No additional management support is needed unless otherwise documented below in the visit note. 

## 2015-05-18 NOTE — Assessment & Plan Note (Signed)
Acute on chronic with feeling poorly, exam o/w benign, but appears mild ill today, suspect underlying acute UTI vs prostatitis, for ua,cx, empiric tx with antibiotic but to hold until after sees urology

## 2015-05-21 ENCOUNTER — Telehealth: Payer: Self-pay | Admitting: Internal Medicine

## 2015-05-21 LAB — URINE CULTURE

## 2015-05-21 MED ORDER — NITROFURANTOIN MONOHYD MACRO 100 MG PO CAPS: 100.0000 mg | ORAL_CAPSULE | Freq: Two times a day (BID) | ORAL | Status: DC

## 2015-05-21 NOTE — Telephone Encounter (Signed)
Patient aware.

## 2015-05-21 NOTE — Telephone Encounter (Signed)
Results of the urine culture show that the levaquin may not work well for his infection as the bacteria is shown to be resistant  We need to change to Macrobid bid - Corinne to inform pt, I will do rx

## 2015-05-25 LAB — CULTURE, BLOOD (SINGLE): Organism ID, Bacteria: NO GROWTH

## 2015-06-01 ENCOUNTER — Telehealth: Payer: Self-pay | Admitting: Internal Medicine

## 2015-06-01 NOTE — Telephone Encounter (Signed)
Patient is requesting that the following scripts be sent to [facility] mail order  for a 1 year supply   albuterol (PROVENTIL HFA;VENTOLIN HFA) 108 (90 BASE) MCG/ACT inhaler [id number]  carvedilol (COREG) 12.5 MG tablet [id number]  hydrochlorothiazide (HYDRODIURIL) 25 MG tablet [id number]  rosuvastatin (CRESTOR) 20 MG tablet [id number]  terazosin (HYTRIN) 10 MG capsule [id number]  tiotropium (SPIRIVA HANDIHALER) 18 MCG inhalation capsule [id number]   He states that these were being sent to [facility] but he needs it to change,.

## 2015-06-02 ENCOUNTER — Telehealth: Payer: Self-pay

## 2015-06-02 MED ORDER — TERAZOSIN HCL 10 MG PO CAPS: 10.0000 mg | ORAL_CAPSULE | Freq: Every day | ORAL | Status: DC

## 2015-06-02 MED ORDER — ALBUTEROL SULFATE HFA 108 (90 BASE) MCG/ACT IN AERS: 2.0000 | INHALATION_SPRAY | Freq: Four times a day (QID) | RESPIRATORY_TRACT | Status: DC | PRN

## 2015-06-02 MED ORDER — ROSUVASTATIN CALCIUM 20 MG PO TABS: 20.0000 mg | ORAL_TABLET | Freq: Every day | ORAL | Status: DC

## 2015-06-02 MED ORDER — HYDROCHLOROTHIAZIDE 25 MG PO TABS: 25.0000 mg | ORAL_TABLET | Freq: Every day | ORAL | Status: DC

## 2015-06-02 MED ORDER — CARVEDILOL 12.5 MG PO TABS: 12.5000 mg | ORAL_TABLET | Freq: Two times a day (BID) | ORAL | Status: DC

## 2015-06-02 MED ORDER — TIOTROPIUM BROMIDE MONOHYDRATE 18 MCG IN CAPS: 18.0000 ug | ORAL_CAPSULE | Freq: Every day | RESPIRATORY_TRACT | Status: DC

## 2015-06-02 NOTE — Telephone Encounter (Signed)
Medications faxed to pharmacy

## 2015-06-02 NOTE — Telephone Encounter (Signed)
Medications have been sent to the correct pharmacy

## 2015-07-07 DIAGNOSIS — H5210 Myopia, unspecified eye: Secondary | ICD-10-CM | POA: Diagnosis not present

## 2015-07-07 DIAGNOSIS — H524 Presbyopia: Secondary | ICD-10-CM | POA: Diagnosis not present

## 2015-09-30 ENCOUNTER — Encounter: Payer: Self-pay | Admitting: Internal Medicine

## 2015-09-30 ENCOUNTER — Ambulatory Visit (INDEPENDENT_AMBULATORY_CARE_PROVIDER_SITE_OTHER)
Admission: RE | Admit: 2015-09-30 | Discharge: 2015-09-30 | Disposition: A | Discharge status: Home / Self Care | Payer: Commercial Managed Care - HMO | Source: Ambulatory Visit | Attending: Internal Medicine | Admitting: Internal Medicine

## 2015-09-30 ENCOUNTER — Ambulatory Visit (INDEPENDENT_AMBULATORY_CARE_PROVIDER_SITE_OTHER): Payer: Commercial Managed Care - HMO | Admitting: Internal Medicine

## 2015-09-30 VITALS — BP 118/72 | HR 75 | Temp 98.9°F | Resp 20 | Wt 167.0 lb

## 2015-09-30 DIAGNOSIS — I10 Essential (primary) hypertension: Secondary | ICD-10-CM

## 2015-09-30 DIAGNOSIS — J441 Chronic obstructive pulmonary disease with (acute) exacerbation: Secondary | ICD-10-CM | POA: Diagnosis not present

## 2015-09-30 DIAGNOSIS — R05 Cough: Secondary | ICD-10-CM

## 2015-09-30 DIAGNOSIS — R059 Cough, unspecified: Secondary | ICD-10-CM

## 2015-09-30 DIAGNOSIS — R079 Chest pain, unspecified: Secondary | ICD-10-CM | POA: Diagnosis not present

## 2015-09-30 MED ORDER — LEVOFLOXACIN 250 MG PO TABS: 250.0000 mg | ORAL_TABLET | Freq: Every day | ORAL | 0 refills | Status: DC

## 2015-09-30 MED ORDER — PREDNISONE 10 MG PO TABS: ORAL_TABLET | ORAL | 0 refills | Status: DC

## 2015-09-30 MED ORDER — HYDROCODONE-HOMATROPINE 5-1.5 MG/5ML PO SYRP: 5.0000 mL | ORAL_SOLUTION | Freq: Four times a day (QID) | ORAL | 0 refills | Status: AC | PRN

## 2015-09-30 NOTE — Progress Notes (Signed)
Subjective:    Patient ID: Justin Walters, male    DOB: 03-21-1940, 75 y.o.   MRN: [id number]  HPI  Here with acute onset mild to mod 2-3 days ST, HA, general weakness and malaise, with prod cough greenish sputum, but Pt denies chest pain, increased sob or doe, wheezing, orthopnea, PND, increased LE swelling, palpitations, dizziness or syncope, except for mild worsening wheezing and sob since last PM Past Medical History:  Diagnosis Date  . At risk for sleep apnea    STOP-BANG= 5    SENT TO PCP 06-13-2013  . Bladder stone   . BPH (benign prostatic hyperplasia)   . COPD (chronic obstructive pulmonary disease) ([place])   . Diverticulosis of colon   . Dysuria   . Glucose intolerance (impaired glucose tolerance)   . History of kidney stones   . Hyperlipidemia   . Hypertension    Past Surgical History:  Procedure Laterality Date  . APPENDECTOMY  1980's  . CATARACT EXTRACTION W/ INTRAOCULAR LENS  IMPLANT, BILATERAL    . COLONOSCOPY  10-05-2003   tics only   . CYSTOSCOPY N/A 06/23/2013   Procedure: CYSTOSCOPY WITH LITHOLAPAXY, BLADDER EXPLORATION, REPAIR BLADDER PERFORATION;  Surgeon: Arvil Persons, MD;  Location: [facility];  Service: Urology;  Laterality: N/A;  . HOLMIUM LASER APPLICATION N/A [id number]   Procedure: HOLMIUM LASER APPLICATION;  Surgeon: Arvil Persons, MD;  Location: [facility];  Service: Urology;  Laterality: N/A;  . LAPAROSCOPIC INGUINAL HERNIA REPAIR Bilateral 10-09-2003  . TRANSURETHRAL RESECTION OF PROSTATE N/A 06/23/2013   Procedure: TRANSURETHRAL RESECTION OF THE PROSTATE;  Surgeon: Arvil Persons, MD;  Location: [facility];  Service: Urology;  Laterality: N/A;    reports that he quit smoking about 22 years ago. His smoking use included Cigarettes. He has a 102.00 pack-year smoking history. He has never used smokeless tobacco. He reports that he drinks about 2.4 oz of alcohol per week . He reports that he does not use drugs. family  history includes COPD in his mother; Cancer in his father; Emphysema in his brother; Heart failure in his brother. No Known Allergies Current Outpatient Prescriptions on File Prior to Visit  Medication Sig Dispense Refill  . albuterol (PROVENTIL HFA;VENTOLIN HFA) 108 (90 Base) MCG/ACT inhaler Inhale 2 puffs into the lungs every 6 (six) hours as needed for wheezing or shortness of breath. 1 Inhaler 11  . aspirin 81 MG EC tablet Take 81 mg by mouth daily. Swallow whole.    . carvedilol (COREG) 12.5 MG tablet Take 1 tablet (12.5 mg total) by mouth 2 (two) times daily with a meal. 180 tablet 3  . gabapentin (NEURONTIN) 300 MG capsule Take 1 capsule (300 mg total) by mouth 3 (three) times daily. 270 capsule 1  . hydrochlorothiazide (HYDRODIURIL) 25 MG tablet Take 1 tablet (25 mg total) by mouth daily. 90 tablet 3  . Multiple Vitamin (MULTIVITAMIN) tablet Take 1 tablet by mouth daily.    . nitrofurantoin, macrocrystal-monohydrate, (MACROBID) 100 MG capsule Take 1 capsule (100 mg total) by mouth 2 (two) times daily. 20 capsule 0  . rosuvastatin (CRESTOR) 20 MG tablet Take 1 tablet (20 mg total) by mouth daily. 90 tablet 3  . sildenafil (VIAGRA) 100 MG tablet Take 0.5-1 tablets (50-100 mg total) by mouth daily as needed for erectile dysfunction. 5 tablet 11  . terazosin (HYTRIN) 10 MG capsule Take 1 capsule (10 mg total) by mouth at bedtime. 90 capsule 3  .  tiotropium (SPIRIVA HANDIHALER) 18 MCG inhalation capsule Place 1 capsule (18 mcg total) into inhaler and inhale daily. 30 capsule 12   No current facility-administered medications on file prior to visit.    Review of Systems  Constitutional: Negative for unusual diaphoresis or night sweats HENT: Negative for ear swelling or discharge Eyes: Negative for worsening visual haziness  Respiratory: Negative for choking and stridor.   Gastrointestinal: Negative for distension or worsening eructation Genitourinary: Negative for retention or change in  urine volume.  Musculoskeletal: Negative for other MSK pain or swelling Skin: Negative for color change and worsening wound Neurological: Negative for tremors and numbness other than noted  Psychiatric/Behavioral: Negative for decreased concentration or agitation other than above       Objective:   Physical Exam BP 118/72   Pulse 75   Temp 98.9 F (37.2 C) (Oral)   Resp 20   Wt 167 lb (75.8 kg)   SpO2 92%   BMI 26.16 kg/m  VS noted, mild ill Constitutional: Pt appears in no apparent distress HENT: Head: NCAT.  Right Ear: External ear normal.  Left Ear: External ear normal.  Eyes: . Pupils are equal, round, and reactive to light. Conjunctivae and EOM are normal Neck: Normal range of motion. Neck supple.  Cardiovascular: Normal rate and regular rhythm.   Pulmonary/Chest: Effort normal and breath sounds decreased without rales but few mild scattered wheezing.  Neurological: Pt is alert. Not confused , motor grossly intact Skin: Skin is warm. No rash, no LE edema Psychiatric: Pt behavior is normal. No agitation.     Assessment & Plan:

## 2015-09-30 NOTE — Patient Instructions (Signed)
Please take all new medication as prescribed - the antibiotic, cough medicine, and prednisone as we discussed  Please continue all other medications as before, and refills have been done if requested.  Please have the pharmacy call with any other refills you may need.  Please keep your appointments with your specialists as you may have planned  Please go to the XRAY Department in the Basement (go straight as you get off the elevator) for the x-ray testing  You will be contacted by phone if any changes need to be made immediately.  Otherwise, you will receive a letter about your results with an explanation, but please check with MyChart first.  Please remember to sign up for MyChart if you have not done so, as this will be important to you in the future with finding out test results, communicating by private email, and scheduling acute appointments online when needed.

## 2015-09-30 NOTE — Progress Notes (Signed)
Pre visit review using our clinic review tool, if applicable. No additional management support is needed unless otherwise documented below in the visit note. 

## 2015-10-01 ENCOUNTER — Telehealth: Payer: Self-pay | Admitting: Emergency Medicine

## 2015-10-01 MED ORDER — LEVOFLOXACIN 250 MG PO TABS: 250.0000 mg | ORAL_TABLET | Freq: Every day | ORAL | 0 refills | Status: DC

## 2015-10-01 MED ORDER — PREDNISONE 10 MG PO TABS: ORAL_TABLET | ORAL | 0 refills | Status: DC

## 2015-10-01 NOTE — Assessment & Plan Note (Addendum)
Mild to mod, c/w bronchitis vs pna, for cxr, for antibx course,  to f/u any worsening symptoms or concerns 

## 2015-10-01 NOTE — Assessment & Plan Note (Signed)
stable overall by history and exam, recent data reviewed with pt, and pt to continue medical treatment as before,  to f/u any worsening symptoms or concerns BP Readings from Last 3 Encounters:  09/30/15 118/72  05/18/15 96/60  04/07/15 116/60

## 2015-10-01 NOTE — Assessment & Plan Note (Signed)
Mild to mod, for predpac asd, cough med prn,  to f/u any worsening symptoms or concerns 

## 2015-10-01 NOTE — Telephone Encounter (Signed)
Patient called and his prescriptions for levofloxacin (LEVAQUIN) 250 MG tablet and predniSONE and (DELTASONE) 10 MG tablet was sent into [place] but he was them sent into  [place]. Thanks.

## 2015-10-08 ENCOUNTER — Other Ambulatory Visit: Payer: Self-pay | Admitting: Internal Medicine

## 2015-10-12 NOTE — Telephone Encounter (Signed)
Done erx 

## 2015-10-29 DIAGNOSIS — N200 Calculus of kidney: Secondary | ICD-10-CM | POA: Diagnosis not present

## 2015-10-29 DIAGNOSIS — N400 Enlarged prostate without lower urinary tract symptoms: Secondary | ICD-10-CM | POA: Diagnosis not present

## 2015-10-29 DIAGNOSIS — N35011 Post-traumatic bulbous urethral stricture: Secondary | ICD-10-CM | POA: Diagnosis not present

## 2015-11-04 ENCOUNTER — Telehealth: Payer: Self-pay | Admitting: Emergency Medicine

## 2015-11-04 DIAGNOSIS — B9620 Unspecified Escherichia coli [E. coli] as the cause of diseases classified elsewhere: Secondary | ICD-10-CM | POA: Diagnosis not present

## 2015-11-04 DIAGNOSIS — N390 Urinary tract infection, site not specified: Secondary | ICD-10-CM | POA: Diagnosis not present

## 2015-11-04 NOTE — Telephone Encounter (Signed)
Ok for appt today

## 2015-11-04 NOTE — Telephone Encounter (Signed)
Patient got antibiotic from Urologist.  Patient does not want appt.

## 2015-11-04 NOTE — Telephone Encounter (Signed)
Pt called and states he thinks he has a UTI. He wants to know if you can call him in an antibiotic or if he needs to make an appt to be seen. Please advise thanks.

## 2015-12-24 DIAGNOSIS — Z87438 Personal history of other diseases of male genital organs: Secondary | ICD-10-CM | POA: Diagnosis not present

## 2015-12-24 DIAGNOSIS — N183 Chronic kidney disease, stage 3 (moderate): Secondary | ICD-10-CM | POA: Diagnosis not present

## 2015-12-24 DIAGNOSIS — N529 Male erectile dysfunction, unspecified: Secondary | ICD-10-CM | POA: Diagnosis not present

## 2015-12-24 DIAGNOSIS — Z87442 Personal history of urinary calculi: Secondary | ICD-10-CM | POA: Diagnosis not present

## 2015-12-24 DIAGNOSIS — R3912 Poor urinary stream: Secondary | ICD-10-CM | POA: Diagnosis not present

## 2015-12-24 DIAGNOSIS — N99114 Postprocedural urethral stricture, male, unspecified: Secondary | ICD-10-CM | POA: Insufficient documentation

## 2015-12-24 DIAGNOSIS — N521 Erectile dysfunction due to diseases classified elsewhere: Secondary | ICD-10-CM | POA: Insufficient documentation

## 2015-12-24 DIAGNOSIS — N401 Enlarged prostate with lower urinary tract symptoms: Secondary | ICD-10-CM | POA: Insufficient documentation

## 2015-12-24 DIAGNOSIS — N99113 Postprocedural anterior urethral stricture: Secondary | ICD-10-CM | POA: Diagnosis not present

## 2015-12-29 ENCOUNTER — Ambulatory Visit: Payer: Self-pay

## 2015-12-29 NOTE — Progress Notes (Addendum)
Subjective:   Justin Walters is a 76 y.o. male who presents for an Initial Medicare Annual Wellness Visit.  The Patient was informed that the wellness visit is to identify future health risk and educate and initiate measures that can reduce risk for increased disease through the lifespan.   Wife passed within the last 2 years, keeps 14 year old granddaughter 3 days/week.   Review of Systems  No ROS.  Medicare Wellness Visit. Cardiac Risk Factors include: advanced age (>37men, >10 women);dyslipidemia;family history of premature cardiovascular disease;hypertension;male gender   Sleep patterns: Home Safety/Smoke Alarms:  Smoke detectors and security in place.  Living environment; residence and Firearm Safety: Daughter lives with pt in 1 story home. Firearms locked away.  Seat Belt Safety/Bike Helmet: wears seat belt   Counseling:   Eye Exam-Last exam 10/2015.  Dental-Last exam 10/2015, followed every 3 months for gum disease at [facility].    Male:   [facility]-colonoscopy 12/04/2013, diverticulosis. No recall.    PSA-04/21/2015, 1.00. Followed by [facility] ([place]) and [facility].     Objective:    Today's Vitals   12/30/15 1108  BP: 112/60  Pulse: 70  SpO2: 96%  Weight: 164 lb 1.9 oz (74.4 kg)  Height: 5\' 7"  (1.702 m)   Body mass index is 25.7 kg/m.  Current Medications (verified) Outpatient Encounter Prescriptions as of 12/30/2015  Medication Sig  . albuterol (PROVENTIL HFA;VENTOLIN HFA) 108 (90 Base) MCG/ACT inhaler Inhale 2 puffs into the lungs every 6 (six) hours as needed for wheezing or shortness of breath.  Marland Kitchen aspirin 81 MG EC tablet Take 81 mg by mouth daily. Swallow whole.  . carvedilol (COREG) 12.5 MG tablet Take 1 tablet (12.5 mg total) by mouth 2 (two) times daily with a meal.  . gabapentin (NEURONTIN) 300 MG capsule TAKE 1 CAPSULE THREE TIMES DAILY  . hydrochlorothiazide (HYDRODIURIL) 25 MG tablet Take 1 tablet (25 mg total) by mouth daily.  . Multiple Vitamin  (MULTIVITAMIN) tablet Take 1 tablet by mouth daily.  . rosuvastatin (CRESTOR) 20 MG tablet Take 1 tablet (20 mg total) by mouth daily.  . sildenafil (VIAGRA) 100 MG tablet Take 0.5-1 tablets (50-100 mg total) by mouth daily as needed for erectile dysfunction.  Marland Kitchen terazosin (HYTRIN) 10 MG capsule Take 1 capsule (10 mg total) by mouth at bedtime.  Marland Kitchen tiotropium (SPIRIVA HANDIHALER) 18 MCG inhalation capsule Place 1 capsule (18 mcg total) into inhaler and inhale daily.  . nitrofurantoin, macrocrystal-monohydrate, (MACROBID) 100 MG capsule Take 1 capsule (100 mg total) by mouth 2 (two) times daily. (Patient not taking: Reported on 12/30/2015)  . [DISCONTINUED] levofloxacin (LEVAQUIN) 250 MG tablet Take 1 tablet (250 mg total) by mouth daily. (Patient not taking: Reported on 12/30/2015)  . [DISCONTINUED] predniSONE (DELTASONE) 10 MG tablet 3 tabs by mouth per day for 3 days,2tabs per day for 3 days,1tab per day for 3 days   No facility-administered encounter medications on file as of 12/30/2015.     Allergies (verified) Patient has no known allergies.   History: Past Medical History:  Diagnosis Date  . At risk for sleep apnea    STOP-BANG= 5    SENT TO PCP 06-13-2013  . Bladder stone   . BPH (benign prostatic hyperplasia)   . COPD (chronic obstructive pulmonary disease) ([place])   . Diverticulosis of colon   . Dysuria   . Glucose intolerance (impaired glucose tolerance)   . History of kidney stones   . Hyperlipidemia   . Hypertension  Past Surgical History:  Procedure Laterality Date  . APPENDECTOMY  1980's  . CATARACT EXTRACTION W/ INTRAOCULAR LENS  IMPLANT, BILATERAL    . COLONOSCOPY  10-05-2003   tics only   . CYSTOSCOPY N/A 06/23/2013   Procedure: CYSTOSCOPY WITH LITHOLAPAXY, BLADDER EXPLORATION, REPAIR BLADDER PERFORATION;  Surgeon: Arvil Persons, MD;  Location: [facility]  Service: Urology;  Laterality: N/A;  . HOLMIUM LASER APPLICATION N/A [id number]   Procedure:  HOLMIUM LASER APPLICATION;  Surgeon: Arvil Persons, MD;  Location[facility];  Service: Urology;  Laterality: N/A;  . LAPAROSCOPIC INGUINAL HERNIA REPAIR Bilateral 10-09-2003  . TRANSURETHRAL RESECTION OF PROSTATE N/A 06/23/2013   Procedure: TRANSURETHRAL RESECTION OF THE PROSTATE;  Surgeon: Arvil Persons, MD;  Location: [facility];  Service: Urology;  Laterality: N/A;   Family History  Problem Relation Age of Onset  . COPD Mother   . Cancer Father     unknown  . Emphysema Brother   . Heart failure Brother   . Colon cancer Neg Hx   . Esophageal cancer Neg Hx   . Rectal cancer Neg Hx   . Stomach cancer Neg Hx    Social History   Occupational History  . Retired Chemical engineer)    Social History Main Topics  . Smoking status: Former Smoker    Packs/day: 3.00    Years: 34.00    Types: Cigarettes    Quit date: 01/02/1993  . Smokeless tobacco: Never Used  . Alcohol use 2.4 oz/week    4 Standard drinks or equivalent per week     Comment: rare  . Drug use: No  . Sexual activity: Not Currently    Partners: Female   Tobacco Counseling Counseling given: Not Answered   Activities of Daily Living No flowsheet data found.  Immunizations and Health Maintenance Immunization History  Administered Date(s) Administered  . Influenza Whole 11/30/2006, 10/03/2007, 12/11/2008, 10/13/2009  . Influenza, High Dose Seasonal PF 12/30/2015  . Influenza,inj,Quad PF,36+ Mos 11/17/2014  . Pneumococcal Conjugate-13 05/01/2013  . Pneumococcal Polysaccharide-23 10/03/2007  . Td 10/03/2007  . Zoster 10/03/2007   There are no preventive care reminders to display for this patient.  Patient Care Team: Biagio Borg, MD as PCP - General [name], MD as Consulting Physician (Urology) Lorenza Evangelist, MD as Referring Physician (Urology)  Indicate any recent Medical Services you may have received from other than [facility] providers in the past year  (date may be approximate).    Assessment:   This is a routine wellness examination for Justin Walters. Physical assessment deferred to PCP.   Hearing/Vision screen Hearing Screening Comments: Able to hear conversational tones w/o difficulty. No issues reported.   Vision Screening Comments: Wears reading glasses.   Dietary issues and exercise activities discussed: Current Exercise Habits: The patient does not participate in regular exercise at present (housekeeping, active with 58 yr old granddaughter), Exercise limited by: respiratory conditions(s)   Diet (meal preparation, eat out, water intake, caffeinated beverages, dairy products, fruits and vegetables): Eats 2 meals a day. Drinks water.   Breakfast: pancake, coffee (1 cup) Lunch: Skips, snacks all day (candy, chips, cookies) Dinner: Meat and vegetables      Encouraged to eat heart healthy options. Discussed not skipping meals, reduce snack foods and increasing water.  Discussed increasing activity as tolerated.   Goals      Patient Stated   . <enter goal here> (pt-stated)          "  breathe better"       Depression Screen PHQ 2/9 Scores 12/30/2015 05/18/2015 05/15/2014 05/15/2014  PHQ - 2 Score 0 4 1 0  PHQ- 9 Score - 12 - -    Fall Risk Fall Risk  12/30/2015 05/18/2015 05/18/2015 05/15/2014 05/15/2014  Falls in the past year? No No No No No  Risk for fall due to : - - - - Other (Comment)  Risk for fall due to (comments): - - - - No falls    Cognitive Function:       Ad8 score reviewed for issues:  Issues making decisions:no  Less interest in hobbies / activities:no  Repeats questions, stories (family complaining):no  Trouble using ordinary gadgets (microwave, computer, phone):no  Forgets the month or year: no  Mismanaging finances: no  Remembering appts:no  Daily problems with thinking and/or memory:no Ad8 score is=0     Screening Tests Health Maintenance  Topic Date Due  . TETANUS/TDAP  10/02/2017  .  COLONOSCOPY  12/04/2020  . INFLUENZA VACCINE  Addressed  . ZOSTAVAX  Completed  . PNA vac Low Risk Adult  Completed        Plan:     Try to eat heart healthy diet (full of fruits, vegetables, whole grains, lean protein, water--limit salt, fat, and sugar intake) and increase physical activity as tolerated.  Continue doing brain stimulating activities (puzzles, reading, adult coloring books, staying active) to keep memory sharp.   Bring a copy of your advance directives to your next office visit.  **FYI-Pt reports SOB, denies chest pain, cough or fever. Appt scheduled for next week. Advised to call for sooner appointment or go to Urgent Care/ED if symptoms worsens.   -Pt to have cystoscopy performed by Dr. Odis Luster in near future (not scheduled as of yet), requesting clearance. Will need statement from PCP stating okay to hold ASA for 10 days.     During the course of the visit Justin Walters was educated and counseled about the following appropriate screening and preventive services:   Vaccines to include Pneumoccal, Influenza, Hepatitis B, Td, Zostavax, HCV  Colorectal cancer screening  Cardiovascular disease screening  Diabetes screening  Glaucoma screening  Nutrition counseling  Prostate cancer screening  Patient Instructions (the written plan) were given to the patient.   Gerilyn Nestle, RN   12/30/2015     Medical screening examination/treatment/procedure(s) were performed by non-physician practitioner and as supervising physician I was immediately available for consultation/collaboration. I agree with above. Cathlean Cower, MD

## 2015-12-30 ENCOUNTER — Ambulatory Visit (INDEPENDENT_AMBULATORY_CARE_PROVIDER_SITE_OTHER): Payer: Commercial Managed Care - HMO

## 2015-12-30 DIAGNOSIS — Z23 Encounter for immunization: Secondary | ICD-10-CM

## 2015-12-30 DIAGNOSIS — Z0000 Encounter for general adult medical examination without abnormal findings: Secondary | ICD-10-CM | POA: Diagnosis not present

## 2015-12-30 NOTE — Progress Notes (Signed)
Pre visit review using our clinic review tool, if applicable. No additional management support is needed unless otherwise documented below in the visit note. 

## 2015-12-30 NOTE — Patient Instructions (Addendum)
Try to eat heart healthy diet (full of fruits, vegetables, whole grains, lean protein, water--limit salt, fat, and sugar intake) and increase physical activity as tolerated.  Continue doing brain stimulating activities (puzzles, reading, adult coloring books, staying active) to keep memory sharp.   Bring a copy of your advance directives to your next office visit.  Fall Prevention in the Home Introduction Falls can cause injuries. They can happen to people of all ages. There are many things you can do to make your home safe and to help prevent falls. What can I do on the outside of my home?  Regularly fix the edges of walkways and driveways and fix any cracks.  Remove anything that might make you trip as you walk through a door, such as a raised step or threshold.  Trim any bushes or trees on the path to your home.  Use bright outdoor lighting.  Clear any walking paths of anything that might make someone trip, such as rocks or tools.  Regularly check to see if handrails are loose or broken. Make sure that both sides of any steps have handrails.  Any raised decks and porches should have guardrails on the edges.  Have any leaves, snow, or ice cleared regularly.  Use sand or salt on walking paths during winter.  Clean up any spills in your garage right away. This includes oil or grease spills. What can I do in the bathroom?  Use night lights.  Install grab bars by the toilet and in the tub and shower. Do not use towel bars as grab bars.  Use non-skid mats or decals in the tub or shower.  If you need to sit down in the shower, use a plastic, non-slip stool.  Keep the floor dry. Clean up any water that spills on the floor as soon as it happens.  Remove soap buildup in the tub or shower regularly.  Attach bath mats securely with double-sided non-slip rug tape.  Do not have throw rugs and other things on the floor that can make you trip. What can I do in the bedroom?  Use  night lights.  Make sure that you have a light by your bed that is easy to reach.  Do not use any sheets or blankets that are too big for your bed. They should not hang down onto the floor.  Have a firm chair that has side arms. You can use this for support while you get dressed.  Do not have throw rugs and other things on the floor that can make you trip. What can I do in the kitchen?  Clean up any spills right away.  Avoid walking on wet floors.  Keep items that you use a lot in easy-to-reach places.  If you need to reach something above you, use a strong step stool that has a grab bar.  Keep electrical cords out of the way.  Do not use floor polish or wax that makes floors slippery. If you must use wax, use non-skid floor wax.  Do not have throw rugs and other things on the floor that can make you trip. What can I do with my stairs?  Do not leave any items on the stairs.  Make sure that there are handrails on both sides of the stairs and use them. Fix handrails that are broken or loose. Make sure that handrails are as long as the stairways.  Check any carpeting to make sure that it is firmly attached to the stairs.  Fix any carpet that is loose or worn.  Avoid having throw rugs at the top or bottom of the stairs. If you do have throw rugs, attach them to the floor with carpet tape.  Make sure that you have a light switch at the top of the stairs and the bottom of the stairs. If you do not have them, ask someone to add them for you. What else can I do to help prevent falls?  Wear shoes that:  Do not have high heels.  Have rubber bottoms.  Are comfortable and fit you well.  Are closed at the toe. Do not wear sandals.  If you use a stepladder:  Make sure that it is fully opened. Do not climb a closed stepladder.  Make sure that both sides of the stepladder are locked into place.  Ask someone to hold it for you, if possible.  Clearly mark and make sure that you  can see:  Any grab bars or handrails.  First and last steps.  Where the edge of each step is.  Use tools that help you move around (mobility aids) if they are needed. These include:  Canes.  Walkers.  Scooters.  Crutches.  Turn on the lights when you go into a dark area. Replace any light bulbs as soon as they burn out.  Set up your furniture so you have a clear path. Avoid moving your furniture around.  If any of your floors are uneven, fix them.  If there are any pets around you, be aware of where they are.  Review your medicines with your doctor. Some medicines can make you feel dizzy. This can increase your chance of falling. Ask your doctor what other things that you can do to help prevent falls. This information is not intended to replace advice given to you by your health care provider. Make sure you discuss any questions you have with your health care provider. Document Released: 10/15/2008 Document Revised: 05/27/2015 Document Reviewed: 01/23/2014  2017 Elsevier  Health Maintenance, Male A healthy lifestyle and preventative care can promote health and wellness.  Maintain regular health, dental, and eye exams.  Eat a healthy diet. Foods like vegetables, fruits, whole grains, low-fat dairy products, and lean protein foods contain the nutrients you need and are low in calories. Decrease your intake of foods high in solid fats, added sugars, and salt. Get information about a proper diet from your health care provider, if necessary.  Regular physical exercise is one of the most important things you can do for your health. Most adults should get at least 150 minutes of moderate-intensity exercise (any activity that increases your heart rate and causes you to sweat) each week. In addition, most adults need muscle-strengthening exercises on 2 or more days a week.   Maintain a healthy weight. The body mass index (BMI) is a screening tool to identify possible weight  problems. It provides an estimate of body fat based on height and weight. Your health care provider can find your BMI and can help you achieve or maintain a healthy weight. For males 20 years and older:  A BMI below 18.5 is considered underweight.  A BMI of 18.5 to 24.9 is normal.  A BMI of 25 to 29.9 is considered overweight.  A BMI of 30 and above is considered obese.  Maintain normal blood lipids and cholesterol by exercising and minimizing your intake of saturated fat. Eat a balanced diet with plenty of fruits and vegetables. Blood tests for  lipids and cholesterol should begin at age 73 and be repeated every 5 years. If your lipid or cholesterol levels are high, you are over age 37, or you are at high risk for heart disease, you may need your cholesterol levels checked more frequently.Ongoing high lipid and cholesterol levels should be treated with medicines if diet and exercise are not working.  If you smoke, find out from your health care provider how to quit. If you do not use tobacco, do not start.  Lung cancer screening is recommended for adults aged 49-80 years who are at high risk for developing lung cancer because of a history of smoking. A yearly low-dose CT scan of the lungs is recommended for people who have at least a 30-pack-year history of smoking and are current smokers or have quit within the past 15 years. A pack year of smoking is smoking an average of 1 pack of cigarettes a day for 1 year (for example, a 30-pack-year history of smoking could mean smoking 1 pack a day for 30 years or 2 packs a day for 15 years). Yearly screening should continue until the smoker has stopped smoking for at least 15 years. Yearly screening should be stopped for people who develop a health problem that would prevent them from having lung cancer treatment.  If you choose to drink alcohol, do not have more than 2 drinks per day. One drink is considered to be 12 oz (360 mL) of beer, 5 oz (150 mL) of  wine, or 1.5 oz (45 mL) of liquor.  Avoid the use of street drugs. Do not share needles with anyone. Ask for help if you need support or instructions about stopping the use of drugs.  High blood pressure causes heart disease and increases the risk of stroke. High blood pressure is more likely to develop in:  People who have blood pressure in the end of the normal range (100-139/85-89 mm Hg).  People who are overweight or obese.  People who are African American.  If you are 45-52 years of age, have your blood pressure checked every 3-5 years. If you are 51 years of age or older, have your blood pressure checked every year. You should have your blood pressure measured twice-once when you are at a hospital or clinic, and once when you are not at a hospital or clinic. Record the average of the two measurements. To check your blood pressure when you are not at a hospital or clinic, you can use:  An automated blood pressure machine at a pharmacy.  A home blood pressure monitor.  If you are 7-55 years old, ask your health care provider if you should take aspirin to prevent heart disease.  Diabetes screening involves taking a blood sample to check your fasting blood sugar level. This should be done once every 3 years after age 77 if you are at a normal weight and without risk factors for diabetes. Testing should be considered at a younger age or be carried out more frequently if you are overweight and have at least 1 risk factor for diabetes.  Colorectal cancer can be detected and often prevented. Most routine colorectal cancer screening begins at the age of 6 and continues through age 34. However, your health care provider may recommend screening at an earlier age if you have risk factors for colon cancer. On a yearly basis, your health care provider may provide home test kits to check for hidden blood in the stool. A small camera at  at the end of a tube may be used to directly examine the colon  (sigmoidoscopy or colonoscopy) to detect the earliest forms of colorectal cancer. Talk to your health care provider about this at age 50 when routine screening begins. A direct exam of the colon should be repeated every 5-10 years through age 75, unless early forms of precancerous polyps or small growths are found.  People who are at an increased risk for hepatitis B should be screened for this virus. You are considered at high risk for hepatitis B if:  You were born in a country where hepatitis B occurs often. Talk with your health care provider about which countries are considered high risk.  Your parents were born in a high-risk country and you have not received a shot to protect against hepatitis B (hepatitis B vaccine).  You have HIV or AIDS.  You use needles to inject street drugs.  You live with, or have sex with, someone who has hepatitis B.  You are a man who has sex with other men (MSM).  You get hemodialysis treatment.  You take certain medicines for conditions like cancer, organ transplantation, and autoimmune conditions.  Hepatitis C blood testing is recommended for all people born from 1945 through 1965 and any individual with known risk factors for hepatitis C.  Healthy men should no longer receive prostate-specific antigen (PSA) blood tests as part of routine cancer screening. Talk to your health care provider about prostate cancer screening.  Testicular cancer screening is not recommended for adolescents or adult males who have no symptoms. Screening includes self-exam, a health care provider exam, and other screening tests. Consult with your health care provider about any symptoms you have or any concerns you have about testicular cancer.  Practice safe sex. Use condoms and avoid high-risk sexual practices to reduce the spread of sexually transmitted infections (STIs).  You should be screened for STIs, including gonorrhea and chlamydia if:  You are sexually active and  are younger than 24 years.  You are older than 24 years, and your health care provider tells you that you are at risk for this type of infection.  Your sexual activity has changed since you were last screened, and you are at an increased risk for chlamydia or gonorrhea. Ask your health care provider if you are at risk.  If you are at risk of being infected with HIV, it is recommended that you take a prescription medicine daily to prevent HIV infection. This is called pre-exposure prophylaxis (PrEP). You are considered at risk if:  You are a man who has sex with other men (MSM).  You are a heterosexual man who is sexually active with multiple partners.  You take drugs by injection.  You are sexually active with a partner who has HIV.  Talk with your health care provider about whether you are at high risk of being infected with HIV. If you choose to begin PrEP, you should first be tested for HIV. You should then be tested every 3 months for as long as you are taking PrEP.  Use sunscreen. Apply sunscreen liberally and repeatedly throughout the day. You should seek shade when your shadow is shorter than you. Protect yourself by wearing long sleeves, pants, a wide-brimmed hat, and sunglasses year round whenever you are outdoors.  Tell your health care provider of new moles or changes in moles, especially if there is a change in shape or color. Also, tell your health care provider if a mole   is larger than the size of a pencil eraser.  A one-time screening for abdominal aortic aneurysm (AAA) and surgical repair of large AAAs by ultrasound is recommended for men aged 65-75 years who are current or former smokers.  Stay current with your vaccines (immunizations). This information is not intended to replace advice given to you by your health care provider. Make sure you discuss any questions you have with your health care provider. Document Released: 06/17/2007 Document Revised: 01/09/2014 Document  Reviewed: 09/22/2014 Elsevier Interactive Patient Education  2017 Elsevier Inc.  

## 2016-01-03 DIAGNOSIS — N289 Disorder of kidney and ureter, unspecified: Secondary | ICD-10-CM

## 2016-01-03 HISTORY — DX: Disorder of kidney and ureter, unspecified: N28.9

## 2016-01-04 ENCOUNTER — Ambulatory Visit (INDEPENDENT_AMBULATORY_CARE_PROVIDER_SITE_OTHER): Payer: Commercial Managed Care - HMO | Admitting: Internal Medicine

## 2016-01-04 ENCOUNTER — Encounter: Payer: Self-pay | Admitting: Internal Medicine

## 2016-01-04 VITALS — BP 130/74 | HR 74 | Resp 20 | Wt 155.0 lb

## 2016-01-04 DIAGNOSIS — R42 Dizziness and giddiness: Secondary | ICD-10-CM | POA: Diagnosis not present

## 2016-01-04 DIAGNOSIS — I10 Essential (primary) hypertension: Secondary | ICD-10-CM

## 2016-01-04 DIAGNOSIS — R0602 Shortness of breath: Secondary | ICD-10-CM | POA: Diagnosis not present

## 2016-01-04 DIAGNOSIS — R7302 Impaired glucose tolerance (oral): Secondary | ICD-10-CM

## 2016-01-04 DIAGNOSIS — J449 Chronic obstructive pulmonary disease, unspecified: Secondary | ICD-10-CM

## 2016-01-04 DIAGNOSIS — Z01818 Encounter for other preprocedural examination: Secondary | ICD-10-CM | POA: Insufficient documentation

## 2016-01-04 DIAGNOSIS — Z0001 Encounter for general adult medical examination with abnormal findings: Secondary | ICD-10-CM

## 2016-01-04 MED ORDER — AMLODIPINE BESYLATE 5 MG PO TABS: 5.0000 mg | ORAL_TABLET | Freq: Every day | ORAL | 3 refills | Status: DC

## 2016-01-04 NOTE — Patient Instructions (Addendum)
OK to hold the Aspirin 81 mg for 10 days prior to the surgury, and you are given the note today  OK to stop the HCT fluid pill for now  Please take all new medication as prescribed  - the amlodipine 5 mg per day  Please continue all other medications as before, and refills have been done if requested.  Please have the pharmacy call with any other refills you may need.  Please keep your appointments with your specialists as you may have planned  Please return in 3 months, or sooner if needed, with Lab testing done 3-5 days before

## 2016-01-04 NOTE — Progress Notes (Signed)
Subjective:    Patient ID: Justin Walters, male    DOB: 02-Jan-1941, 76 y.o.   MRN: [id number]  HPI  Here to f/u, has hx of urethral stricture s/p dilation x 2, but now to the point over the past month he has had to self cath with each urination episode.  Has seen Dr Terlecki/[facility], now for urethral stricute surgury, needs to be off the asa x 10 days prior. Pt denies chest pain, increased sob or doe, wheezing, orthopnea, PND, increased LE swelling, palpitations, syncope, but has had signficant orthostatic symptoms mild but more frequenct x several months, no falls or trauma.  Would like change of diuretic  Pt denies new neurological symptoms such as new headache, or facial or extremity weakness or numbness.   Pt denies polydipsia, polyuria,  Past Medical History:  Diagnosis Date  . At risk for sleep apnea    STOP-BANG= 5    SENT TO PCP 06-13-2013  . Bladder stone   . BPH (benign prostatic hyperplasia)   . COPD (chronic obstructive pulmonary disease) ([place])   . Diverticulosis of colon   . Dysuria   . Glucose intolerance (impaired glucose tolerance)   . History of kidney stones   . Hyperlipidemia   . Hypertension    Past Surgical History:  Procedure Laterality Date  . APPENDECTOMY  1980's  . CATARACT EXTRACTION W/ INTRAOCULAR LENS  IMPLANT, BILATERAL    . COLONOSCOPY  10-05-2003   tics only   . CYSTOSCOPY N/A 06/23/2013   Procedure: CYSTOSCOPY WITH LITHOLAPAXY, BLADDER EXPLORATION, REPAIR BLADDER PERFORATION;  Surgeon: Arvil Persons, MD;  Location: [facility];  Service: Urology;  Laterality: N/A;  . HOLMIUM LASER APPLICATION N/A [id number]   Procedure: HOLMIUM LASER APPLICATION;  Surgeon: Arvil Persons, MD;  Location: [facility];  Service: Urology;  Laterality: N/A;  . LAPAROSCOPIC INGUINAL HERNIA REPAIR Bilateral 10-09-2003  . TRANSURETHRAL RESECTION OF PROSTATE N/A 06/23/2013   Procedure: TRANSURETHRAL RESECTION OF THE PROSTATE;  Surgeon: Arvil Persons, MD;   Location: [facility];  Service: Urology;  Laterality: N/A;    reports that he quit smoking about 23 years ago. His smoking use included Cigarettes. He has a 102.00 pack-year smoking history. He has never used smokeless tobacco. He reports that he drinks about 2.4 oz of alcohol per week . He reports that he does not use drugs. family history includes COPD in his mother; Cancer in his father; Emphysema in his brother; Heart failure in his brother. No Known Allergies Current Outpatient Prescriptions on File Prior to Visit  Medication Sig Dispense Refill  . albuterol (PROVENTIL HFA;VENTOLIN HFA) 108 (90 Base) MCG/ACT inhaler Inhale 2 puffs into the lungs every 6 (six) hours as needed for wheezing or shortness of breath. 1 Inhaler 11  . aspirin 81 MG EC tablet Take 81 mg by mouth daily. Swallow whole.    . carvedilol (COREG) 12.5 MG tablet Take 1 tablet (12.5 mg total) by mouth 2 (two) times daily with a meal. 180 tablet 3  . gabapentin (NEURONTIN) 300 MG capsule TAKE 1 CAPSULE THREE TIMES DAILY 270 capsule 1  . Multiple Vitamin (MULTIVITAMIN) tablet Take 1 tablet by mouth daily.    . nitrofurantoin, macrocrystal-monohydrate, (MACROBID) 100 MG capsule Take 1 capsule (100 mg total) by mouth 2 (two) times daily. 20 capsule 0  . rosuvastatin (CRESTOR) 20 MG tablet Take 1 tablet (20 mg total) by mouth daily. 90 tablet 3  . sildenafil (VIAGRA)  100 MG tablet Take 0.5-1 tablets (50-100 mg total) by mouth daily as needed for erectile dysfunction. 5 tablet 11  . terazosin (HYTRIN) 10 MG capsule Take 1 capsule (10 mg total) by mouth at bedtime. 90 capsule 3  . tiotropium (SPIRIVA HANDIHALER) 18 MCG inhalation capsule Place 1 capsule (18 mcg total) into inhaler and inhale daily. 30 capsule 12   No current facility-administered medications on file prior to visit.    Review of Systems  Constitutional: Negative for unusual diaphoresis or night sweats HENT: Negative for ear swelling or  discharge Eyes: Negative for worsening visual haziness  Respiratory: Negative for choking and stridor.   Gastrointestinal: Negative for distension or worsening eructation Genitourinary: Negative for retention or change in urine volume.  Musculoskeletal: Negative for other MSK pain or swelling Skin: Negative for color change and worsening wound Neurological: Negative for tremors and numbness other than noted  Psychiatric/Behavioral: Negative for decreased concentration or agitation other than above   All other system neg per pt     Objective:   Physical Exam BP 130/74   Pulse 74   Resp 20   Wt 155 lb (70.3 kg)   SpO2 95%   BMI 24.28 kg/m  VS noted,  Constitutional: Pt appears in no apparent distress HENT: Head: NCAT.  Right Ear: External ear normal.  Left Ear: External ear normal.  Eyes: . Pupils are equal, round, and reactive to light. Conjunctivae and EOM are normal Neck: Normal range of motion. Neck supple.  Cardiovascular: Normal rate and regular rhythm.   Pulmonary/Chest: Effort normal and breath sounds without rales or wheezing.  Abd:  Soft, NT, ND, + BS Neurological: Pt is alert. Not confused , motor grossly intact Skin: Skin is warm. No rash, no LE edema Psychiatric: Pt behavior is normal. No agitation.  No other new exam findings  ECG today I have personally interpreted Sinus  Rhythm   -  Nonspecific T-abnormality.     Assessment & Plan:

## 2016-01-04 NOTE — Assessment & Plan Note (Addendum)
Etiology unclear, ? Neuritic vs volume, mild overall, to d/c the hct, cont to monitor closely  Note:  Total time for pt hx, exam, review of record with pt in the room, determination of diagnoses and plan for further eval and tx is > 40 min, with over 50% spent in coordination and counseling of patient

## 2016-01-04 NOTE — Assessment & Plan Note (Signed)
stable overall by history and exam, recent data reviewed with pt, and pt to continue medical treatment as before but if d/c hct will need start amlodipine 5 qd,  to f/u any worsening symptoms or concerns,

## 2016-01-04 NOTE — Progress Notes (Signed)
Pre visit review using our clinic review tool, if applicable. No additional management support is needed unless otherwise documented below in the visit note. 

## 2016-01-04 NOTE — Assessment & Plan Note (Signed)
stable overall by history and exam, recent data reviewed with pt, and pt to continue medical treatment as before,  to f/u any worsening symptoms or concerns @LASTSAO2(3)@  

## 2016-01-04 NOTE — Assessment & Plan Note (Signed)
stable overall by history and exam, recent data reviewed with pt, and pt to continue medical treatment as before,  to f/u any worsening symptoms or concerns Lab Results  Component Value Date   HGBA1C 6.1 04/21/2015

## 2016-01-09 NOTE — Assessment & Plan Note (Signed)
Ok for stricture related GU surgury, ok for off asa x 10 days prior,  to f/u any worsening symptoms or concerns

## 2016-02-07 DIAGNOSIS — N359 Urethral stricture, unspecified: Secondary | ICD-10-CM | POA: Diagnosis not present

## 2016-02-07 DIAGNOSIS — Z7982 Long term (current) use of aspirin: Secondary | ICD-10-CM | POA: Diagnosis not present

## 2016-02-07 DIAGNOSIS — Z7951 Long term (current) use of inhaled steroids: Secondary | ICD-10-CM | POA: Diagnosis not present

## 2016-02-07 DIAGNOSIS — J449 Chronic obstructive pulmonary disease, unspecified: Secondary | ICD-10-CM | POA: Diagnosis not present

## 2016-02-07 DIAGNOSIS — Z87442 Personal history of urinary calculi: Secondary | ICD-10-CM | POA: Diagnosis not present

## 2016-02-07 DIAGNOSIS — G4700 Insomnia, unspecified: Secondary | ICD-10-CM | POA: Diagnosis not present

## 2016-02-07 DIAGNOSIS — N529 Male erectile dysfunction, unspecified: Secondary | ICD-10-CM | POA: Diagnosis not present

## 2016-02-07 DIAGNOSIS — Z87891 Personal history of nicotine dependence: Secondary | ICD-10-CM | POA: Diagnosis not present

## 2016-02-07 DIAGNOSIS — Z79899 Other long term (current) drug therapy: Secondary | ICD-10-CM | POA: Diagnosis not present

## 2016-03-17 DIAGNOSIS — N529 Male erectile dysfunction, unspecified: Secondary | ICD-10-CM | POA: Diagnosis not present

## 2016-03-17 DIAGNOSIS — N183 Chronic kidney disease, stage 3 (moderate): Secondary | ICD-10-CM | POA: Diagnosis not present

## 2016-03-17 DIAGNOSIS — N99113 Postprocedural anterior urethral stricture: Secondary | ICD-10-CM | POA: Diagnosis not present

## 2016-03-17 DIAGNOSIS — Z87448 Personal history of other diseases of urinary system: Secondary | ICD-10-CM | POA: Diagnosis not present

## 2016-03-27 DIAGNOSIS — I129 Hypertensive chronic kidney disease with stage 1 through stage 4 chronic kidney disease, or unspecified chronic kidney disease: Secondary | ICD-10-CM | POA: Diagnosis not present

## 2016-03-27 DIAGNOSIS — N401 Enlarged prostate with lower urinary tract symptoms: Secondary | ICD-10-CM | POA: Diagnosis not present

## 2016-03-27 DIAGNOSIS — N359 Urethral stricture, unspecified: Secondary | ICD-10-CM | POA: Diagnosis not present

## 2016-03-27 DIAGNOSIS — J449 Chronic obstructive pulmonary disease, unspecified: Secondary | ICD-10-CM | POA: Insufficient documentation

## 2016-03-27 DIAGNOSIS — R3912 Poor urinary stream: Secondary | ICD-10-CM | POA: Diagnosis not present

## 2016-03-27 DIAGNOSIS — N183 Chronic kidney disease, stage 3 (moderate): Secondary | ICD-10-CM | POA: Diagnosis not present

## 2016-03-27 DIAGNOSIS — Z87891 Personal history of nicotine dependence: Secondary | ICD-10-CM | POA: Diagnosis not present

## 2016-03-29 DIAGNOSIS — I129 Hypertensive chronic kidney disease with stage 1 through stage 4 chronic kidney disease, or unspecified chronic kidney disease: Secondary | ICD-10-CM | POA: Diagnosis not present

## 2016-03-29 DIAGNOSIS — N401 Enlarged prostate with lower urinary tract symptoms: Secondary | ICD-10-CM | POA: Diagnosis not present

## 2016-03-29 DIAGNOSIS — R3912 Poor urinary stream: Secondary | ICD-10-CM | POA: Diagnosis not present

## 2016-03-29 DIAGNOSIS — N359 Urethral stricture, unspecified: Secondary | ICD-10-CM | POA: Diagnosis not present

## 2016-03-29 DIAGNOSIS — N99113 Postprocedural anterior urethral stricture: Secondary | ICD-10-CM | POA: Diagnosis not present

## 2016-03-29 DIAGNOSIS — Z87891 Personal history of nicotine dependence: Secondary | ICD-10-CM | POA: Diagnosis not present

## 2016-03-29 DIAGNOSIS — N183 Chronic kidney disease, stage 3 (moderate): Secondary | ICD-10-CM | POA: Diagnosis not present

## 2016-03-29 DIAGNOSIS — J449 Chronic obstructive pulmonary disease, unspecified: Secondary | ICD-10-CM | POA: Diagnosis not present

## 2016-03-30 DIAGNOSIS — N359 Urethral stricture, unspecified: Secondary | ICD-10-CM | POA: Diagnosis not present

## 2016-03-30 DIAGNOSIS — I129 Hypertensive chronic kidney disease with stage 1 through stage 4 chronic kidney disease, or unspecified chronic kidney disease: Secondary | ICD-10-CM | POA: Diagnosis not present

## 2016-03-30 DIAGNOSIS — Z87891 Personal history of nicotine dependence: Secondary | ICD-10-CM | POA: Diagnosis not present

## 2016-03-30 DIAGNOSIS — R3912 Poor urinary stream: Secondary | ICD-10-CM | POA: Diagnosis not present

## 2016-03-30 DIAGNOSIS — N183 Chronic kidney disease, stage 3 (moderate): Secondary | ICD-10-CM | POA: Diagnosis not present

## 2016-03-30 DIAGNOSIS — N401 Enlarged prostate with lower urinary tract symptoms: Secondary | ICD-10-CM | POA: Diagnosis not present

## 2016-03-30 DIAGNOSIS — J449 Chronic obstructive pulmonary disease, unspecified: Secondary | ICD-10-CM | POA: Diagnosis not present

## 2016-04-02 ENCOUNTER — Emergency Department (HOSPITAL_COMMUNITY)
Admission: EM | Admit: 2016-04-02 | Discharge: 2016-04-02 | Disposition: A | Discharge status: Home / Self Care | Payer: Medicare HMO | Attending: Emergency Medicine | Admitting: Emergency Medicine

## 2016-04-02 ENCOUNTER — Encounter (HOSPITAL_COMMUNITY): Payer: Self-pay | Admitting: Emergency Medicine

## 2016-04-02 DIAGNOSIS — J449 Chronic obstructive pulmonary disease, unspecified: Secondary | ICD-10-CM | POA: Insufficient documentation

## 2016-04-02 DIAGNOSIS — N189 Chronic kidney disease, unspecified: Secondary | ICD-10-CM | POA: Insufficient documentation

## 2016-04-02 DIAGNOSIS — Z87891 Personal history of nicotine dependence: Secondary | ICD-10-CM | POA: Diagnosis not present

## 2016-04-02 DIAGNOSIS — I129 Hypertensive chronic kidney disease with stage 1 through stage 4 chronic kidney disease, or unspecified chronic kidney disease: Secondary | ICD-10-CM | POA: Diagnosis not present

## 2016-04-02 DIAGNOSIS — T83198A Other mechanical complication of other urinary devices and implants, initial encounter: Secondary | ICD-10-CM | POA: Diagnosis not present

## 2016-04-02 DIAGNOSIS — Z7982 Long term (current) use of aspirin: Secondary | ICD-10-CM | POA: Insufficient documentation

## 2016-04-02 DIAGNOSIS — T83498A Other mechanical complication of other prosthetic devices, implants and grafts of genital tract, initial encounter: Secondary | ICD-10-CM | POA: Diagnosis not present

## 2016-04-02 DIAGNOSIS — R339 Retention of urine, unspecified: Secondary | ICD-10-CM | POA: Insufficient documentation

## 2016-04-02 DIAGNOSIS — R109 Unspecified abdominal pain: Secondary | ICD-10-CM | POA: Diagnosis present

## 2016-04-02 NOTE — ED Triage Notes (Signed)
Brought in by EMS from home with c/o suprapubic and urethral  Pain.  Pt has had a foley catheter placed after his urethral/penile surgery on Wednesday.  Pt reports having "on and off spastic pain" throughout the day and pain "is getting worse when it does".   Pt also reports "leakage of urine around insertion site".

## 2016-04-02 NOTE — ED Notes (Signed)
Bed: NT61 Expected date:  Expected time:  Means of arrival:  Comments: EMS 76 yo male catheter pain and leakage

## 2016-04-02 NOTE — ED Provider Notes (Signed)
[facility] DEPT Provider Note   CSN: [id number] Arrival date & time: 04/02/16  0344     History   Chief Complaint Chief Complaint  Patient presents with  . Abdominal Pain    HPI GARETH FITZNER is a 76 y.o. male PMH of urethral stricture s/p dilation at [facility], here with acute urinary retention.  Patient has been having significant pain at the penis with leakage of urine around the foley catheter. He was a suprapubic catheter as well which is closed off and not currently in use.  He denies problems there.  He denies fevers or infections with the surgery. There are no further complaints.  10 Systems reviewed and are negative for acute change except as noted in the HPI.   HPI  Past Medical History:  Diagnosis Date  . At risk for sleep apnea    STOP-BANG= 5    SENT TO PCP 06-13-2013  . Bladder stone   . BPH (benign prostatic hyperplasia)   . COPD (chronic obstructive pulmonary disease) ([place])   . Diverticulosis of colon   . Dysuria   . Glucose intolerance (impaired glucose tolerance)   . History of kidney stones   . Hyperlipidemia   . Hypertension     Patient Active Problem List   Diagnosis Date Noted  . Dizziness 01/04/2016  . Preop exam for internal medicine 01/04/2016  . Cough 04/07/2015  . COPD exacerbation ([place]) 04/07/2015  . Gastroenteritis 01/16/2015  . Skin soreness 05/15/2014  . Pelvic pain in male 03/28/2012  . Headache 03/28/2012  . Chest pain, unspecified 03/28/2012  . Impaired glucose tolerance 01/14/2011  . Encounter for preventative adult health care exam with abnormal findings 01/14/2011  . Encounter for long-term (current) use of other medications 01/09/2011  . BENIGN PROSTATIC HYPERTROPHY 01/08/2009  . ERECTILE DYSFUNCTION 04/07/2008  . CKD (chronic kidney disease) 10/03/2007  . SKIN LESION 10/03/2007  . PSA, INCREASED 10/03/2007  . Dysuria 07/26/2007  . DIVERTICULOSIS, COLON 02/15/2007  . Hyperlipidemia 09/23/2006  . Essential hypertension  09/23/2006  . RENAL CALCULUS, HX OF 09/23/2006  . COPD (chronic obstructive pulmonary disease) ([place]) 08/01/2006    Past Surgical History:  Procedure Laterality Date  . APPENDECTOMY  1980's  . CATARACT EXTRACTION W/ INTRAOCULAR LENS  IMPLANT, BILATERAL    . COLONOSCOPY  10-05-2003   tics only   . CYSTOSCOPY N/A 06/23/2013   Procedure: CYSTOSCOPY WITH LITHOLAPAXY, BLADDER EXPLORATION, REPAIR BLADDER PERFORATION;  Surgeon: Arvil Persons, MD;  Location: [facility];  Service: Urology;  Laterality: N/A;  . HOLMIUM LASER APPLICATION N/A 03/25/4008   Procedure: HOLMIUM LASER APPLICATION;  Surgeon: Arvil Persons, MD;  Location: [facility];  Service: Urology;  Laterality: N/A;  . LAPAROSCOPIC INGUINAL HERNIA REPAIR Bilateral 10-09-2003  . TRANSURETHRAL RESECTION OF PROSTATE N/A 06/23/2013   Procedure: TRANSURETHRAL RESECTION OF THE PROSTATE;  Surgeon: Arvil Persons, MD;  Location: [facility];  Service: Urology;  Laterality: N/A;       Home Medications    Prior to Admission medications   Medication Sig Start Date End Date Taking? Authorizing Provider  albuterol (PROVENTIL HFA;VENTOLIN HFA) 108 (90 Base) MCG/ACT inhaler Inhale 2 puffs into the lungs every 6 (six) hours as needed for wheezing or shortness of breath. 06/02/15   Biagio Borg, MD  amLODipine (NORVASC) 5 MG tablet Take 1 tablet (5 mg total) by mouth daily. 01/04/16 01/03/17  Biagio Borg, MD  aspirin 81 MG EC tablet Take 81 mg  by mouth daily. Swallow whole.    Biagio Borg, MD  carvedilol (COREG) 12.5 MG tablet Take 1 tablet (12.5 mg total) by mouth 2 (two) times daily with a meal. 06/02/15   Biagio Borg, MD  gabapentin (NEURONTIN) 300 MG capsule TAKE 1 CAPSULE THREE TIMES DAILY 10/12/15   Biagio Borg, MD  Multiple Vitamin (MULTIVITAMIN) tablet Take 1 tablet by mouth daily.    Historical Provider, MD  nitrofurantoin, macrocrystal-monohydrate, (MACROBID) 100 MG capsule Take 1 capsule (100 mg total) by  mouth 2 (two) times daily. 05/21/15   Biagio Borg, MD  rosuvastatin (CRESTOR) 20 MG tablet Take 1 tablet (20 mg total) by mouth daily. 06/02/15   Biagio Borg, MD  sildenafil (VIAGRA) 100 MG tablet Take 0.5-1 tablets (50-100 mg total) by mouth daily as needed for erectile dysfunction. 05/18/15   Biagio Borg, MD  terazosin (HYTRIN) 10 MG capsule Take 1 capsule (10 mg total) by mouth at bedtime. 06/02/15   Biagio Borg, MD  tiotropium (SPIRIVA HANDIHALER) 18 MCG inhalation capsule Place 1 capsule (18 mcg total) into inhaler and inhale daily. 06/02/15   Biagio Borg, MD    Family History Family History  Problem Relation Age of Onset  . COPD Mother   . Cancer Father     unknown  . Emphysema Brother   . Heart failure Brother   . Colon cancer Neg Hx   . Esophageal cancer Neg Hx   . Rectal cancer Neg Hx   . Stomach cancer Neg Hx     Social History Social History  Substance Use Topics  . Smoking status: Former Smoker    Packs/day: 3.00    Years: 34.00    Types: Cigarettes    Quit date: 01/02/1993  . Smokeless tobacco: Never Used  . Alcohol use 2.4 oz/week    4 Standard drinks or equivalent per week     Comment: rare     Allergies   Patient has no known allergies.   Review of Systems Review of Systems   Physical Exam Updated Vital Signs BP 126/76   Pulse 84   Temp 97.8 F (36.6 C) (Oral)   Resp 18   SpO2 96%   Physical Exam  Constitutional: He is oriented to person, place, and time. Vital signs are normal. He appears well-developed and well-nourished.  Non-toxic appearance. He does not appear ill. No distress.  HENT:  Head: Normocephalic and atraumatic.  Nose: Nose normal.  Mouth/Throat: Oropharynx is clear and moist. No oropharyngeal exudate.  Eyes: Conjunctivae and EOM are normal. Pupils are equal, round, and reactive to light. No scleral icterus.  Neck: Normal range of motion. Neck supple. No tracheal deviation, no edema, no erythema and normal range of motion  present. No thyroid mass and no thyromegaly present.  Cardiovascular: Normal rate, regular rhythm, S1 normal, S2 normal, normal heart sounds, intact distal pulses and normal pulses.  Exam reveals no gallop and no friction rub.   No murmur heard. Pulmonary/Chest: Effort normal and breath sounds normal. No respiratory distress. He has no wheezes. He has no rhonchi. He has no rales.  Abdominal: Soft. Normal appearance and bowel sounds are normal. He exhibits no distension, no ascites and no mass. There is no hepatosplenomegaly. There is no tenderness. There is no rebound, no guarding and no CVA tenderness.  Genitourinary:  Genitourinary Comments: Suprapubic catheter in place, no signs of infection.  Penis is in sterile dressing with coban around it.  Foley  in place but kinked near the attachment site.  Musculoskeletal: Normal range of motion. He exhibits no edema or tenderness.  Lymphadenopathy:    He has no cervical adenopathy.  Neurological: He is alert and oriented to person, place, and time. He has normal strength. No cranial nerve deficit or sensory deficit.  Skin: Skin is warm, dry and intact. No petechiae and no rash noted. He is not diaphoretic. No erythema. No pallor.  Nursing note and vitals reviewed.    ED Treatments / Results  Labs (all labs ordered are listed, but only abnormal results are displayed) Labs Reviewed - No data to display  EKG  EKG Interpretation None       Radiology No results found.  Procedures Procedures (including critical care time)  Medications Ordered in ED Medications - No data to display   Initial Impression / Assessment and Plan / ED Course  I have reviewed the triage vital signs and the nursing notes.  Pertinent labs & imaging results that were available during my care of the patient were reviewed by me and considered in my medical decision making (see chart for details).     Patient presents to the ED with penile pain. Likely retention  from closed off catheter.  RN unkninked the tubing and urine flowed out. Patient states he instantly felt much better and currently now has zero pain.  He has fu on 4/16 but will call this week for close follow up. VS are normal. Patient safe for Dc.  Final Clinical Impressions(s) / ED Diagnoses   Final diagnoses:  Urinary retention    New Prescriptions New Prescriptions   No medications on file     Everlene Balls, MD 04/02/16 1610

## 2016-04-03 ENCOUNTER — Telehealth: Payer: Self-pay | Admitting: Internal Medicine

## 2016-04-03 NOTE — Telephone Encounter (Signed)
Pt needs a refill on his gabapentin (NEURONTIN) 300 MG capsule sent to [facility]. He had an appointment scheduled for tomorrow but had to reschedule it due to some surgery that he had done. His next appt is scheduled for 05/04/16. Please advise. Thanks [employer]

## 2016-04-03 NOTE — Telephone Encounter (Signed)
Routing to dr john, please advise, thanks 

## 2016-04-04 ENCOUNTER — Ambulatory Visit: Payer: Commercial Managed Care - HMO | Admitting: Internal Medicine

## 2016-04-04 MED ORDER — GABAPENTIN 300 MG PO CAPS: ORAL_CAPSULE | ORAL | 1 refills | Status: DC

## 2016-04-04 NOTE — Telephone Encounter (Signed)
Done erx 

## 2016-04-05 NOTE — Telephone Encounter (Signed)
Patient advised that rx has been sent to pharm 

## 2016-04-18 DIAGNOSIS — N99113 Postprocedural anterior urethral stricture: Secondary | ICD-10-CM | POA: Diagnosis not present

## 2016-04-27 DIAGNOSIS — N358 Other urethral stricture: Secondary | ICD-10-CM | POA: Diagnosis not present

## 2016-04-27 DIAGNOSIS — R9349 Abnormal radiologic findings on diagnostic imaging of other urinary organs: Secondary | ICD-10-CM | POA: Diagnosis not present

## 2016-05-04 ENCOUNTER — Ambulatory Visit: Payer: Commercial Managed Care - HMO | Admitting: Internal Medicine

## 2016-05-09 ENCOUNTER — Other Ambulatory Visit: Payer: Self-pay | Admitting: Internal Medicine

## 2016-05-11 DIAGNOSIS — R938 Abnormal findings on diagnostic imaging of other specified body structures: Secondary | ICD-10-CM | POA: Diagnosis not present

## 2016-05-11 DIAGNOSIS — Z936 Other artificial openings of urinary tract status: Secondary | ICD-10-CM | POA: Diagnosis not present

## 2016-05-11 DIAGNOSIS — N359 Urethral stricture, unspecified: Secondary | ICD-10-CM | POA: Diagnosis not present

## 2016-05-11 DIAGNOSIS — N99113 Postprocedural anterior urethral stricture: Secondary | ICD-10-CM | POA: Diagnosis not present

## 2016-05-11 DIAGNOSIS — N200 Calculus of kidney: Secondary | ICD-10-CM | POA: Diagnosis not present

## 2016-05-23 ENCOUNTER — Ambulatory Visit: Payer: Commercial Managed Care - HMO | Admitting: Internal Medicine

## 2016-06-02 DIAGNOSIS — N3289 Other specified disorders of bladder: Secondary | ICD-10-CM | POA: Diagnosis not present

## 2016-06-02 DIAGNOSIS — N99113 Postprocedural anterior urethral stricture: Secondary | ICD-10-CM | POA: Diagnosis not present

## 2016-06-02 DIAGNOSIS — N359 Urethral stricture, unspecified: Secondary | ICD-10-CM | POA: Diagnosis not present

## 2016-06-02 DIAGNOSIS — Z9889 Other specified postprocedural states: Secondary | ICD-10-CM | POA: Diagnosis not present

## 2016-06-02 DIAGNOSIS — N200 Calculus of kidney: Secondary | ICD-10-CM | POA: Diagnosis not present

## 2016-06-15 DIAGNOSIS — N99113 Postprocedural anterior urethral stricture: Secondary | ICD-10-CM | POA: Diagnosis not present

## 2016-06-15 DIAGNOSIS — N200 Calculus of kidney: Secondary | ICD-10-CM | POA: Diagnosis not present

## 2016-06-15 DIAGNOSIS — N359 Urethral stricture, unspecified: Secondary | ICD-10-CM | POA: Diagnosis not present

## 2016-06-15 DIAGNOSIS — R140 Abdominal distension (gaseous): Secondary | ICD-10-CM | POA: Diagnosis not present

## 2016-06-15 DIAGNOSIS — N1370 Vesicoureteral-reflux, unspecified: Secondary | ICD-10-CM | POA: Diagnosis not present

## 2016-06-15 DIAGNOSIS — Z9889 Other specified postprocedural states: Secondary | ICD-10-CM | POA: Diagnosis not present

## 2016-06-16 ENCOUNTER — Ambulatory Visit: Payer: Commercial Managed Care - HMO | Admitting: Internal Medicine

## 2016-06-27 ENCOUNTER — Ambulatory Visit: Payer: Commercial Managed Care - HMO | Admitting: Internal Medicine

## 2016-07-07 DIAGNOSIS — N99113 Postprocedural anterior urethral stricture: Secondary | ICD-10-CM | POA: Diagnosis not present

## 2016-07-07 DIAGNOSIS — N183 Chronic kidney disease, stage 3 (moderate): Secondary | ICD-10-CM | POA: Diagnosis not present

## 2016-07-07 DIAGNOSIS — N529 Male erectile dysfunction, unspecified: Secondary | ICD-10-CM | POA: Diagnosis not present

## 2016-07-07 DIAGNOSIS — Z87438 Personal history of other diseases of male genital organs: Secondary | ICD-10-CM | POA: Diagnosis not present

## 2016-07-07 DIAGNOSIS — Z87442 Personal history of urinary calculi: Secondary | ICD-10-CM | POA: Diagnosis not present

## 2016-07-10 ENCOUNTER — Other Ambulatory Visit: Payer: Commercial Managed Care - HMO

## 2016-07-10 ENCOUNTER — Telehealth: Payer: Self-pay | Admitting: Internal Medicine

## 2016-07-10 DIAGNOSIS — N359 Urethral stricture, unspecified: Secondary | ICD-10-CM | POA: Diagnosis not present

## 2016-07-10 NOTE — Telephone Encounter (Signed)
Patient has a 3 month fu on 7/18.  Patient requesting labs to be entered to be done before this date.  Please follow up with patient in regard.

## 2016-07-11 NOTE — Telephone Encounter (Signed)
Pt already had labs ordered in his chart.

## 2016-07-11 NOTE — Telephone Encounter (Signed)
I do not see this showing on appt desk and lab could not find them when he went down yesterday.

## 2016-07-12 ENCOUNTER — Ambulatory Visit: Payer: Commercial Managed Care - HMO | Admitting: Internal Medicine

## 2016-07-17 ENCOUNTER — Other Ambulatory Visit: Payer: Commercial Managed Care - HMO

## 2016-07-19 ENCOUNTER — Encounter: Payer: Self-pay | Admitting: Internal Medicine

## 2016-07-19 ENCOUNTER — Other Ambulatory Visit: Payer: Self-pay | Admitting: Internal Medicine

## 2016-07-19 ENCOUNTER — Ambulatory Visit (INDEPENDENT_AMBULATORY_CARE_PROVIDER_SITE_OTHER): Payer: Medicare HMO | Admitting: Internal Medicine

## 2016-07-19 ENCOUNTER — Other Ambulatory Visit (INDEPENDENT_AMBULATORY_CARE_PROVIDER_SITE_OTHER): Payer: Medicare HMO

## 2016-07-19 VITALS — BP 118/88 | HR 66 | Ht 67.0 in | Wt 155.0 lb

## 2016-07-19 DIAGNOSIS — I10 Essential (primary) hypertension: Secondary | ICD-10-CM

## 2016-07-19 DIAGNOSIS — Z0001 Encounter for general adult medical examination with abnormal findings: Secondary | ICD-10-CM

## 2016-07-19 DIAGNOSIS — H9193 Unspecified hearing loss, bilateral: Secondary | ICD-10-CM | POA: Diagnosis not present

## 2016-07-19 DIAGNOSIS — H6123 Impacted cerumen, bilateral: Secondary | ICD-10-CM | POA: Diagnosis not present

## 2016-07-19 DIAGNOSIS — N183 Chronic kidney disease, stage 3 unspecified: Secondary | ICD-10-CM

## 2016-07-19 DIAGNOSIS — J449 Chronic obstructive pulmonary disease, unspecified: Secondary | ICD-10-CM

## 2016-07-19 DIAGNOSIS — N189 Chronic kidney disease, unspecified: Secondary | ICD-10-CM | POA: Diagnosis not present

## 2016-07-19 LAB — LIPID PANEL
CHOLESTEROL: 120 mg/dL (ref 0–200)
HDL: 42.2 mg/dL (ref >39.00)
LDL Cholesterol: 61 mg/dL (ref 0–99)
NONHDL: 77.85
TRIGLYCERIDES: 85 mg/dL (ref 0.0–149.0)
Total CHOL/HDL Ratio: 3
VLDL: 17 mg/dL (ref 0.0–40.0)

## 2016-07-19 LAB — BASIC METABOLIC PANEL
BUN: 17 mg/dL (ref 6–23)
CALCIUM: 10 mg/dL (ref 8.4–10.5)
CHLORIDE: 103 meq/L (ref 96–112)
CO2: 31 meq/L (ref 19–32)
CREATININE: 1.74 mg/dL — AB (ref 0.40–1.50)
GFR: 40.81 mL/min — ABNORMAL LOW (ref >60.00)
GLUCOSE: 107 mg/dL — AB (ref 70–99)
Potassium: 4.8 mEq/L (ref 3.5–5.1)
Sodium: 142 mEq/L (ref 135–145)

## 2016-07-19 LAB — TSH: TSH: 2.72 u[IU]/mL (ref 0.35–4.50)

## 2016-07-19 LAB — PSA: PSA: 2.26 ng/mL (ref 0.10–4.00)

## 2016-07-19 LAB — URINALYSIS, ROUTINE W REFLEX MICROSCOPIC
Bilirubin Urine: NEGATIVE
Ketones, ur: NEGATIVE
Nitrite: POSITIVE — AB
PH: 6 (ref 5.0–8.0)
RBC / HPF: NONE SEEN (ref 0–?)
SPECIFIC GRAVITY, URINE: 1.025 (ref 1.000–1.030)
TOTAL PROTEIN, URINE-UPE24: 30 — AB
UROBILINOGEN UA: 0.2 (ref 0.0–1.0)
Urine Glucose: NEGATIVE

## 2016-07-19 LAB — HEPATIC FUNCTION PANEL
ALBUMIN: 4.3 g/dL (ref 3.5–5.2)
ALT: 7 U/L (ref 0–53)
AST: 14 U/L (ref 0–37)
Alkaline Phosphatase: 58 U/L (ref 39–117)
BILIRUBIN DIRECT: 0.1 mg/dL (ref 0.0–0.3)
TOTAL PROTEIN: 7.2 g/dL (ref 6.0–8.3)
Total Bilirubin: 0.5 mg/dL (ref 0.2–1.2)

## 2016-07-19 LAB — CBC WITH DIFFERENTIAL/PLATELET
BASOS ABS: 0.1 10*3/uL (ref 0.0–0.1)
Basophils Relative: 1.4 % (ref 0.0–3.0)
EOS ABS: 0.4 10*3/uL (ref 0.0–0.7)
Eosinophils Relative: 6.5 % — ABNORMAL HIGH (ref 0.0–5.0)
HEMATOCRIT: 39.6 % (ref 39.0–52.0)
Hemoglobin: 13 g/dL (ref 13.0–17.0)
LYMPHS PCT: 22.6 % (ref 12.0–46.0)
Lymphs Abs: 1.3 10*3/uL (ref 0.7–4.0)
MCHC: 32.7 g/dL (ref 30.0–36.0)
MCV: 86 fl (ref 78.0–100.0)
Monocytes Absolute: 0.6 10*3/uL (ref 0.1–1.0)
Monocytes Relative: 11.1 % (ref 3.0–12.0)
NEUTROS ABS: 3.3 10*3/uL (ref 1.4–7.7)
NEUTROS PCT: 58.4 % (ref 43.0–77.0)
PLATELETS: 224 10*3/uL (ref 150.0–400.0)
RBC: 4.61 Mil/uL (ref 4.22–5.81)
RDW: 14.8 % (ref 11.5–15.5)
WBC: 5.6 10*3/uL (ref 4.0–10.5)

## 2016-07-19 MED ORDER — ALBUTEROL SULFATE HFA 108 (90 BASE) MCG/ACT IN AERS: 2.0000 | INHALATION_SPRAY | Freq: Four times a day (QID) | RESPIRATORY_TRACT | 3 refills | Status: DC | PRN

## 2016-07-19 MED ORDER — TIOTROPIUM BROMIDE MONOHYDRATE 18 MCG IN CAPS: 18.0000 ug | ORAL_CAPSULE | Freq: Every day | RESPIRATORY_TRACT | 3 refills | Status: DC

## 2016-07-19 MED ORDER — BUDESONIDE-FORMOTEROL FUMARATE 160-4.5 MCG/ACT IN AERO: 2.0000 | INHALATION_SPRAY | Freq: Two times a day (BID) | RESPIRATORY_TRACT | 3 refills | Status: DC

## 2016-07-19 NOTE — Patient Instructions (Signed)
Your ears were irrigated of wax today  Please take all new medication as prescribed - the symbicort (and call for change if this is not covered with your insurance)  Please continue all other medications as before, and refills have been done if requested - the inhalers  Please have the pharmacy call with any other refills you may need.  Please continue your efforts at being more active, low cholesterol diet, and weight control.  You are otherwise up to date with prevention measures today.  Please keep your appointments with your specialists as you may have planned  Please go to the LAB in the Basement (turn left off the elevator) for the tests to be done today  You will be contacted by phone if any changes need to be made immediately.  Otherwise, you will receive a letter about your results with an explanation, but please check with MyChart first.  Please remember to sign up for MyChart if you have not done so, as this will be important to you in the future with finding out test results, communicating by private email, and scheduling acute appointments online when needed.  Please return in 1 year for your yearly visit, or sooner if needed, with Lab testing done 3-5 days before

## 2016-07-19 NOTE — Progress Notes (Signed)
Subjective:    Patient ID: Justin Walters, male    DOB: [birth year], 76 y.o.   MRN: [id number]  HPI  Here for wellness and f/u;  Overall doing ok;  Pt denies Chest pain, orthopnea, PND, worsening LE edema, palpitations, dizziness or syncope.  Pt denies neurological change such as new headache, facial or extremity weakness.  Pt denies polydipsia, polyuria, or low sugar symptoms. Pt states overall good compliance with treatment and medications, good tolerability, and has been trying to follow appropriate diet.  Pt denies worsening depressive symptoms, suicidal ideation or panic. No fever, night sweats, wt loss, loss of appetite, or other constitutional symptoms.  Pt states good ability with ADL's, has low fall risk, home safety reviewed and adequate, no other significant changes in hearing or vision, and only occasionally active with exercise. Has had mild persistent wheezing with sob and doe in last few months despite current inhalers with non prod cough, seems to be worse after eating for some reason but denies aspiration. Nothing else seems to make better or worse.  S/p penile surgury, no longer doing self caths, last UTI 2017 Denies urinary symptoms such as dysuria, frequency, urgency, flank pain, hematuria or n/v, fever, chills.  Also c/o bilat hearing loss in the past 2 wks, seems stopped up, opens up occasionally with better hearing, not better with OTC ear wax treatments Past Medical History:  Diagnosis Date  . At risk for sleep apnea    STOP-BANG= 5    SENT TO PCP 06-13-2013  . Bladder stone   . BPH (benign prostatic hyperplasia)   . COPD (chronic obstructive pulmonary disease) ([place])   . Diverticulosis of colon   . Dysuria   . Glucose intolerance (impaired glucose tolerance)   . History of kidney stones   . Hyperlipidemia   . Hypertension    Past Surgical History:  Procedure Laterality Date  . APPENDECTOMY  1980's  . CATARACT EXTRACTION W/ INTRAOCULAR LENS  IMPLANT, BILATERAL    .  COLONOSCOPY  10-05-2003   tics only   . CYSTOSCOPY N/A 06/23/2013   Procedure: CYSTOSCOPY WITH LITHOLAPAXY, BLADDER EXPLORATION, REPAIR BLADDER PERFORATION;  Surgeon: Arvil Persons, MD;  Location: [facility];  Service: Urology;  Laterality: N/A;  . HOLMIUM LASER APPLICATION N/A 7/86/7672   Procedure: HOLMIUM LASER APPLICATION;  Surgeon: Arvil Persons, MD;  Location: [facility];  Service: Urology;  Laterality: N/A;  . LAPAROSCOPIC INGUINAL HERNIA REPAIR Bilateral 10-09-2003  . TRANSURETHRAL RESECTION OF PROSTATE N/A 06/23/2013   Procedure: TRANSURETHRAL RESECTION OF THE PROSTATE;  Surgeon: Arvil Persons, MD;  Location: [facility];  Service: Urology;  Laterality: N/A;    reports that he quit smoking about 23 years ago. His smoking use included Cigarettes. He has a 102.00 pack-year smoking history. He has never used smokeless tobacco. He reports that he drinks about 2.4 oz of alcohol per week . He reports that he does not use drugs. family history includes COPD in his mother; Cancer in his father; Emphysema in his brother; Heart failure in his brother. No Known Allergies Current Outpatient Prescriptions on File Prior to Visit  Medication Sig Dispense Refill  . amLODipine (NORVASC) 5 MG tablet Take 1 tablet (5 mg total) by mouth daily. 90 tablet 3  . aspirin 81 MG EC tablet Take 81 mg by mouth daily. Swallow whole.    . carvedilol (COREG) 12.5 MG tablet TAKE 1 TABLET TWICE DAILY WITH MEALS 180 tablet 2  .  gabapentin (NEURONTIN) 300 MG capsule TAKE 1 CAPSULE THREE TIMES DAILY 270 capsule 1  . Multiple Vitamin (MULTIVITAMIN) tablet Take 1 tablet by mouth daily.    . nitrofurantoin, macrocrystal-monohydrate, (MACROBID) 100 MG capsule Take 1 capsule (100 mg total) by mouth 2 (two) times daily. 20 capsule 0  . rosuvastatin (CRESTOR) 20 MG tablet TAKE 1 TABLET EVERY DAY 90 tablet 2  . sildenafil (VIAGRA) 100 MG tablet Take 0.5-1 tablets (50-100 mg total) by mouth  daily as needed for erectile dysfunction. 5 tablet 11  . terazosin (HYTRIN) 10 MG capsule TAKE 1 CAPSULE AT BEDTIME 90 capsule 2   No current facility-administered medications on file prior to visit.    Review of Systems Constitutional: Negative for other unusual diaphoresis, sweats, appetite or weight changes HENT: Negative for other worsening hearing loss, ear pain, facial swelling, mouth sores or neck stiffness.   Eyes: Negative for other worsening pain, redness or other visual disturbance.  Respiratory: Negative for other stridor or swelling Cardiovascular: Negative for other palpitations or other chest pain  Gastrointestinal: Negative for worsening diarrhea or loose stools, blood in stool, distention or other pain Genitourinary: Negative for hematuria, flank pain or other change in urine volume.  Musculoskeletal: Negative for myalgias or other joint swelling.  Skin: Negative for other color change, or other wound or worsening drainage.  Neurological: Negative for other syncope or numbness. Hematological: Negative for other adenopathy or swelling Psychiatric/Behavioral: Negative for hallucinations, other worsening agitation, SI, self-injury, or new decreased concentration No other exam findings    Objective:   Physical Exam BP 118/88   Pulse 66   Ht 5\' 7"  (1.702 m)   Wt 155 lb (70.3 kg)   SpO2 98%   BMI 24.28 kg/m  VS noted,  Constitutional: Pt is oriented to person, place, and time. Appears well-developed and well-nourished, in no significant distress and comfortable Head: Normocephalic and atraumatic  Eyes: Conjunctivae and EOM are normal. Pupils are equal, round, and reactive to light Bilat wax impactions resolved with irrigation Right Ear: External ear normal without discharge Left Ear: External ear normal without discharge Nose: Nose without discharge or deformity Mouth/Throat: Oropharynx is without other ulcerations and moist  Neck: Normal range of motion. Neck supple.  No JVD present. No tracheal deviation present or significant neck LA or mass Cardiovascular: Normal rate, regular rhythm, normal heart sounds and intact distal pulses.   Pulmonary/Chest: WOB normal and breath sounds decreased without rales but with few scattered wheezing  Abdominal: Soft. Bowel sounds are normal. NT. No HSM  Musculoskeletal: Normal range of motion. Exhibits no edema Lymphadenopathy: Has no other cervical adenopathy.  Neurological: Pt is alert and oriented to person, place, and time. Pt has normal reflexes. No cranial nerve deficit. Motor grossly intact, Gait intact Skin: Skin is warm and dry. No rash noted or new ulcerations Psychiatric:  Has normal mood and affect. Behavior is normal without agitation No other exam findings Lab Results  Component Value Date   WBC 8.5 05/18/2015   HGB 15.1 05/18/2015   HCT 44.1 05/18/2015   PLT 181.0 05/18/2015   GLUCOSE 99 05/18/2015   CHOL 151 04/21/2015   TRIG 115.0 04/21/2015   HDL 45.90 04/21/2015   LDLDIRECT 161.9 03/28/2012   LDLCALC 82 04/21/2015   ALT 16 04/21/2015   AST 20 04/21/2015   NA 136 05/18/2015   K 3.3 (L) 05/18/2015   CL 93 (L) 05/18/2015   CREATININE 1.63 (H) 05/18/2015   BUN 29 (H) 05/18/2015  CO2 26 05/18/2015   TSH 1.89 04/21/2015   PSA 1.00 04/21/2015   HGBA1C 6.1 04/21/2015      Assessment & Plan:

## 2016-07-20 ENCOUNTER — Telehealth: Payer: Self-pay

## 2016-07-20 MED ORDER — CEPHALEXIN 500 MG PO CAPS: 500.0000 mg | ORAL_CAPSULE | Freq: Three times a day (TID) | ORAL | 0 refills | Status: AC

## 2016-07-20 NOTE — Addendum Note (Signed)
Addended by: Biagio Borg on: 07/20/2016 01:47 PM   Modules accepted: Orders

## 2016-07-20 NOTE — Telephone Encounter (Signed)
Pt has been informed.

## 2016-07-20 NOTE — Telephone Encounter (Signed)
Pt was informed and expressed understanding. He did say that he is having pain with urination and would like something called in. Please advise.

## 2016-07-20 NOTE — Telephone Encounter (Signed)
[place] for cephalexin course - done erx

## 2016-07-20 NOTE — Telephone Encounter (Signed)
-----   Message from Biagio Borg, MD sent at 07/19/2016 12:39 PM EDT ----- Letter sent, cont same tx except   The test results show that your current treatment is OK, except the kidney function is worse again. We will need to refer you to Nephrology (kidney doctors) as we mentioned might be needed at your visit.  The urine testing is also abnormal, but it seems that you have "chronic urinary tract infection" that likely does not need treatment at this time.  If you begin to have symptoms such as pain with urination, blood or fever, please let us know.    Zacharey Jensen to please inform pt, I will do referral, and let me know if pt has any urinary symptoms

## 2016-07-22 NOTE — Assessment & Plan Note (Signed)
stable overall by history and exam, recent data reviewed with pt, and pt to continue medical treatment as before,  to f/u any worsening symptoms or concerns Lab Results  Component Value Date   CREATININE 1.74 (H) 07/19/2016

## 2016-07-22 NOTE — Assessment & Plan Note (Signed)
stable overall by history and exam, recent data reviewed with pt, and pt to continue medical treatment as before,  to f/u any worsening symptoms or concerns BP Readings from Last 3 Encounters:  07/19/16 118/88  04/02/16 126/76  01/04/16 130/74

## 2016-07-22 NOTE — Assessment & Plan Note (Signed)
Improved with irrigation,  to f/u any worsening symptoms or concerns  

## 2016-07-22 NOTE — Assessment & Plan Note (Addendum)
With recent worsening symptoms, exam ok today, for add symbicort asd,  to f/u any worsening symptoms or concerns  In addition to the time spent performing CPE, I spent an additional 25 minutes face to face,in which greater than 50% of this time was spent in counseling and coordination of care for patient's acute illness as documented, including the differential diagnosis, evaluation, tx and other management of copd, BP control, renal disease, and irrigation required for resolution of bilateral wax impactions

## 2016-07-22 NOTE — Assessment & Plan Note (Signed)

## 2016-08-04 DIAGNOSIS — Z87438 Personal history of other diseases of male genital organs: Secondary | ICD-10-CM | POA: Diagnosis not present

## 2016-08-04 DIAGNOSIS — N183 Chronic kidney disease, stage 3 (moderate): Secondary | ICD-10-CM | POA: Diagnosis not present

## 2016-08-04 DIAGNOSIS — N359 Urethral stricture, unspecified: Secondary | ICD-10-CM | POA: Diagnosis not present

## 2016-08-04 DIAGNOSIS — Z87442 Personal history of urinary calculi: Secondary | ICD-10-CM | POA: Diagnosis not present

## 2016-08-04 DIAGNOSIS — N529 Male erectile dysfunction, unspecified: Secondary | ICD-10-CM | POA: Diagnosis not present

## 2016-08-15 ENCOUNTER — Other Ambulatory Visit (INDEPENDENT_AMBULATORY_CARE_PROVIDER_SITE_OTHER): Payer: Medicare HMO

## 2016-08-15 ENCOUNTER — Telehealth: Payer: Self-pay | Admitting: Internal Medicine

## 2016-08-15 ENCOUNTER — Other Ambulatory Visit: Payer: Self-pay | Admitting: Internal Medicine

## 2016-08-15 DIAGNOSIS — R300 Dysuria: Secondary | ICD-10-CM

## 2016-08-15 LAB — URINALYSIS, ROUTINE W REFLEX MICROSCOPIC
Bilirubin Urine: NEGATIVE
Ketones, ur: NEGATIVE
Nitrite: NEGATIVE
SPECIFIC GRAVITY, URINE: 1.02 (ref 1.000–1.030)
Total Protein, Urine: 100 — AB
URINE GLUCOSE: NEGATIVE
UROBILINOGEN UA: 0.2 (ref 0.0–1.0)
pH: 6.5 (ref 5.0–8.0)

## 2016-08-15 MED ORDER — NITROFURANTOIN MONOHYD MACRO 100 MG PO CAPS: 100.0000 mg | ORAL_CAPSULE | Freq: Two times a day (BID) | ORAL | 0 refills | Status: DC

## 2016-08-15 NOTE — Telephone Encounter (Signed)
Please have pt come to the lab for orders that have been out in. I will call patient with results once completed with further instructions.

## 2016-08-15 NOTE — Telephone Encounter (Signed)
LVM notifying Pt.

## 2016-08-15 NOTE — Telephone Encounter (Signed)
Pt called stating Jenny Reichmann has been treating him for a UTI and it is still burning when he urinates, he would like an antibiotic called in  Please advise

## 2016-08-15 NOTE — Telephone Encounter (Signed)
Unfortunately burning is not always a UTI  Ok for urine studies, but would need ROV if UA neg and still having symptoms

## 2016-08-16 ENCOUNTER — Telehealth: Payer: Self-pay

## 2016-08-16 NOTE — Telephone Encounter (Signed)
Pt has been informed and expressed understanding.  

## 2016-08-16 NOTE — Telephone Encounter (Signed)
-----   Message from Biagio Borg, MD sent at 08/15/2016  6:30 PM EDT ----- UA reviewed, ok for macrobid at longer course this time, as his last urine cx in may 2017 showed a resistant bacteria but still sensitive to this  Shirron to please inform pt, I will do rx

## 2016-08-17 ENCOUNTER — Encounter: Payer: Self-pay | Admitting: Internal Medicine

## 2016-08-17 LAB — URINE CULTURE

## 2016-08-21 ENCOUNTER — Other Ambulatory Visit: Payer: Self-pay | Admitting: Internal Medicine

## 2016-09-01 DIAGNOSIS — N2581 Secondary hyperparathyroidism of renal origin: Secondary | ICD-10-CM | POA: Diagnosis not present

## 2016-09-01 DIAGNOSIS — N183 Chronic kidney disease, stage 3 (moderate): Secondary | ICD-10-CM | POA: Diagnosis not present

## 2016-09-01 DIAGNOSIS — J449 Chronic obstructive pulmonary disease, unspecified: Secondary | ICD-10-CM | POA: Diagnosis not present

## 2016-09-01 DIAGNOSIS — I129 Hypertensive chronic kidney disease with stage 1 through stage 4 chronic kidney disease, or unspecified chronic kidney disease: Secondary | ICD-10-CM | POA: Diagnosis not present

## 2016-09-05 ENCOUNTER — Other Ambulatory Visit: Payer: Self-pay | Admitting: Nephrology

## 2016-09-06 ENCOUNTER — Other Ambulatory Visit: Payer: Self-pay | Admitting: Nephrology

## 2016-09-06 DIAGNOSIS — I10 Essential (primary) hypertension: Secondary | ICD-10-CM

## 2016-09-06 DIAGNOSIS — N183 Chronic kidney disease, stage 3 unspecified: Secondary | ICD-10-CM

## 2016-09-06 DIAGNOSIS — N23 Unspecified renal colic: Secondary | ICD-10-CM

## 2016-09-08 ENCOUNTER — Ambulatory Visit
Admission: RE | Admit: 2016-09-08 | Discharge: 2016-09-08 | Disposition: A | Discharge status: Home / Self Care | Payer: Medicare HMO | Source: Ambulatory Visit | Attending: Nephrology | Admitting: Nephrology

## 2016-09-08 DIAGNOSIS — N132 Hydronephrosis with renal and ureteral calculous obstruction: Secondary | ICD-10-CM | POA: Diagnosis not present

## 2016-09-08 DIAGNOSIS — N23 Unspecified renal colic: Secondary | ICD-10-CM

## 2016-09-14 DIAGNOSIS — R8271 Bacteriuria: Secondary | ICD-10-CM | POA: Diagnosis not present

## 2016-09-14 DIAGNOSIS — N201 Calculus of ureter: Secondary | ICD-10-CM | POA: Diagnosis not present

## 2016-09-14 DIAGNOSIS — N132 Hydronephrosis with renal and ureteral calculous obstruction: Secondary | ICD-10-CM | POA: Diagnosis not present

## 2016-09-18 ENCOUNTER — Other Ambulatory Visit: Payer: Self-pay | Admitting: Urology

## 2016-09-19 ENCOUNTER — Encounter: Payer: Self-pay | Admitting: Internal Medicine

## 2016-09-19 ENCOUNTER — Ambulatory Visit (INDEPENDENT_AMBULATORY_CARE_PROVIDER_SITE_OTHER): Payer: Medicare HMO | Admitting: Internal Medicine

## 2016-09-19 VITALS — BP 122/82 | HR 104 | Temp 98.4°F | Ht 67.0 in | Wt 155.0 lb

## 2016-09-19 DIAGNOSIS — R7302 Impaired glucose tolerance (oral): Secondary | ICD-10-CM

## 2016-09-19 DIAGNOSIS — I10 Essential (primary) hypertension: Secondary | ICD-10-CM

## 2016-09-19 DIAGNOSIS — J449 Chronic obstructive pulmonary disease, unspecified: Secondary | ICD-10-CM

## 2016-09-19 DIAGNOSIS — J019 Acute sinusitis, unspecified: Secondary | ICD-10-CM | POA: Diagnosis not present

## 2016-09-19 MED ORDER — BENZONATATE 100 MG PO CAPS: ORAL_CAPSULE | ORAL | 0 refills | Status: DC

## 2016-09-19 MED ORDER — AZITHROMYCIN 250 MG PO TABS: ORAL_TABLET | ORAL | 1 refills | Status: DC

## 2016-09-19 NOTE — Assessment & Plan Note (Signed)
Mild to mod, for antibx course,  to f/u any worsening symptoms or concerns 

## 2016-09-19 NOTE — Patient Instructions (Signed)
Please take all new medication as prescribed - the antibiotic, and cough pills for cough  Please continue all other medications as before, and refills have been done if requested.  Please have the pharmacy call with any other refills you may need.  Please keep your appointments with your specialists as you may have planned

## 2016-09-19 NOTE — Assessment & Plan Note (Signed)
BP Readings from Last 3 Encounters:  09/19/16 122/82  07/19/16 118/88  04/02/16 126/76  stable overall by history and exam, recent data reviewed with pt, and pt to continue medical treatment as before,  to f/u any worsening symptoms or concerns

## 2016-09-19 NOTE — Assessment & Plan Note (Signed)
Lab Results  Component Value Date   HGBA1C 6.1 04/21/2015  stable overall by history and exam, recent data reviewed with pt, and pt to continue medical treatment as before,  to f/u any worsening symptoms or concerns

## 2016-09-19 NOTE — Progress Notes (Signed)
Subjective:    Patient ID: Justin Walters, male    DOB: 11-27-1940, 76 y.o.   MRN: [id number]  HPI   Here with 2-3 days acute onset fever, facial pain, pressure, headache, general weakness and malaise, and greenish d/c, with mild ST and cough, but pt denies chest pain, wheezing, increased sob or doe, orthopnea, PND, increased LE swelling, palpitations, dizziness or syncope.   Pt denies polydipsia, polyuria, Pt denies new neurological symptoms such as new headache, or facial or extremity weakness or numbness Past Medical History:  Diagnosis Date  . At risk for sleep apnea    STOP-BANG= 5    SENT TO PCP 06-13-2013  . Bladder stone   . BPH (benign prostatic hyperplasia)   . COPD (chronic obstructive pulmonary disease) ([place])   . Diverticulosis of colon   . Dysuria   . Glucose intolerance (impaired glucose tolerance)   . History of kidney stones   . Hyperlipidemia   . Hypertension    Past Surgical History:  Procedure Laterality Date  . APPENDECTOMY  1980's  . CATARACT EXTRACTION W/ INTRAOCULAR LENS  IMPLANT, BILATERAL    . COLONOSCOPY  10-05-2003   tics only   . CYSTOSCOPY N/A 06/23/2013   Procedure: CYSTOSCOPY WITH LITHOLAPAXY, BLADDER EXPLORATION, REPAIR BLADDER PERFORATION;  Surgeon: Arvil Persons, MD;  Location: [facility];  Service: Urology;  Laterality: N/A;  . HOLMIUM LASER APPLICATION N/A 6/78/9381   Procedure: HOLMIUM LASER APPLICATION;  Surgeon: Arvil Persons, MD;  Location: [facility];  Service: Urology;  Laterality: N/A;  . LAPAROSCOPIC INGUINAL HERNIA REPAIR Bilateral 10-09-2003  . TRANSURETHRAL RESECTION OF PROSTATE N/A 06/23/2013   Procedure: TRANSURETHRAL RESECTION OF THE PROSTATE;  Surgeon: Arvil Persons, MD;  Location: [facility];  Service: Urology;  Laterality: N/A;    reports that he quit smoking about 23 years ago. His smoking use included Cigarettes. He has a 102.00 pack-year smoking history. He has never used smokeless tobacco.  He reports that he drinks about 2.4 oz of alcohol per week . He reports that he does not use drugs. family history includes COPD in his mother; Cancer in his father; Emphysema in his brother; Heart failure in his brother. No Known Allergies Current Outpatient Prescriptions on File Prior to Visit  Medication Sig Dispense Refill  . albuterol (PROVENTIL HFA;VENTOLIN HFA) 108 (90 Base) MCG/ACT inhaler Inhale 2 puffs into the lungs every 6 (six) hours as needed for wheezing or shortness of breath. 3 Inhaler 3  . amLODipine (NORVASC) 5 MG tablet Take 1 tablet (5 mg total) by mouth daily. 90 tablet 3  . aspirin 81 MG EC tablet Take 81 mg by mouth daily. Swallow whole.    . budesonide-formoterol (SYMBICORT) 160-4.5 MCG/ACT inhaler Inhale 2 puffs into the lungs 2 (two) times daily. 3 Inhaler 3  . carvedilol (COREG) 12.5 MG tablet TAKE 1 TABLET TWICE DAILY WITH MEALS 180 tablet 2  . gabapentin (NEURONTIN) 300 MG capsule TAKE 1 CAPSULE THREE TIMES DAILY 270 capsule 3  . Multiple Vitamin (MULTIVITAMIN) tablet Take 1 tablet by mouth daily.    . nitrofurantoin, macrocrystal-monohydrate, (MACROBID) 100 MG capsule Take 1 capsule (100 mg total) by mouth 2 (two) times daily. 28 capsule 0  . rosuvastatin (CRESTOR) 20 MG tablet TAKE 1 TABLET EVERY DAY 90 tablet 2  . sildenafil (VIAGRA) 100 MG tablet Take 0.5-1 tablets (50-100 mg total) by mouth daily as needed for erectile dysfunction. 5 tablet 11  . terazosin (  HYTRIN) 10 MG capsule TAKE 1 CAPSULE AT BEDTIME 90 capsule 2  . tiotropium (SPIRIVA HANDIHALER) 18 MCG inhalation capsule Place 1 capsule (18 mcg total) into inhaler and inhale daily. 90 capsule 3   No current facility-administered medications on file prior to visit.    Review of Systems  Constitutional: Negative for other unusual diaphoresis or sweats HENT: Negative for ear discharge or swelling Eyes: Negative for other worsening visual disturbances Respiratory: Negative for stridor or other swelling    Gastrointestinal: Negative for worsening distension or other blood Genitourinary: Negative for retention or other urinary change Musculoskeletal: Negative for other MSK pain or swelling Skin: Negative for color change or other new lesions Neurological: Negative for worsening tremors and other numbness  Psychiatric/Behavioral: Negative for worsening agitation or other fatigue All other system neg per pt    Objective:   Physical Exam BP 122/82   Pulse (!) 104   Temp 98.4 F (36.9 C) (Oral)   Ht 5\' 7"  (1.702 m)   Wt 155 lb (70.3 kg)   SpO2 96%   BMI 24.28 kg/m  VS noted, mild ill Constitutional: Pt appears in NAD HENT: Head: NCAT.  Right Ear: External ear normal.  Left Ear: External ear normal.  Eyes: . Pupils are equal, round, and reactive to light. Conjunctivae and EOM are normal Nose: without d/c or deformity Bilat tm's with mild erythema.  Max sinus areas mod tender.  Pharynx with mild erythema, no exudate Neck: Neck supple. Gross normal ROM Cardiovascular: Normal rate and regular rhythm.   Pulmonary/Chest: Effort normal and breath sounds decreased without rales or wheezing.  Neurological: Pt is alert. At baseline orientation, motor grossly intact Skin: Skin is warm. No rashes, other new lesions, no LE edema Psychiatric: Pt behavior is normal without agitation  No other exam findings    Assessment & Plan:

## 2016-09-19 NOTE — Assessment & Plan Note (Signed)
stable overall by history and exam, and pt to continue medical treatment as before,  to f/u any worsening symptoms or concerns 

## 2016-09-25 ENCOUNTER — Ambulatory Visit (HOSPITAL_COMMUNITY): Admission: RE | Admit: 2016-09-25 | Payer: Medicare HMO | Source: Ambulatory Visit | Admitting: Urology

## 2016-09-25 ENCOUNTER — Encounter (HOSPITAL_COMMUNITY): Admission: RE | Payer: Self-pay | Source: Ambulatory Visit

## 2016-09-25 SURGERY — LITHOTRIPSY, ESWL
Anesthesia: LOCAL | Laterality: Left

## 2016-09-27 ENCOUNTER — Telehealth: Payer: Self-pay | Admitting: Internal Medicine

## 2016-09-27 MED ORDER — LEVOFLOXACIN 250 MG PO TABS: 250.0000 mg | ORAL_TABLET | Freq: Every day | ORAL | 0 refills | Status: DC

## 2016-09-27 NOTE — Telephone Encounter (Signed)
Pt has been informed new script has been sent in

## 2016-09-27 NOTE — Telephone Encounter (Signed)
Pt called stating that he was seen by Dr Jenny Reichmann on 09/19/16 but has not improved. He wanted to know if there was anything else he could take to help get ride of it.

## 2016-09-27 NOTE — Telephone Encounter (Signed)
Ok to try change zpack to levaquin - done erx

## 2016-10-12 DIAGNOSIS — N201 Calculus of ureter: Secondary | ICD-10-CM | POA: Diagnosis not present

## 2016-10-12 DIAGNOSIS — N3000 Acute cystitis without hematuria: Secondary | ICD-10-CM | POA: Diagnosis not present

## 2016-10-13 DIAGNOSIS — N3001 Acute cystitis with hematuria: Secondary | ICD-10-CM | POA: Diagnosis not present

## 2016-10-16 ENCOUNTER — Other Ambulatory Visit: Payer: Self-pay | Admitting: Urology

## 2016-10-18 ENCOUNTER — Other Ambulatory Visit: Payer: Self-pay | Admitting: Urology

## 2016-10-20 ENCOUNTER — Encounter (HOSPITAL_COMMUNITY): Payer: Self-pay | Admitting: General Practice

## 2016-10-23 ENCOUNTER — Ambulatory Visit (HOSPITAL_COMMUNITY)
Admission: RE | Admit: 2016-10-23 | Discharge: 2016-10-23 | Disposition: A | Discharge status: Home / Self Care | Payer: Medicare HMO | Source: Ambulatory Visit | Attending: Urology | Admitting: Urology

## 2016-10-23 ENCOUNTER — Ambulatory Visit (HOSPITAL_COMMUNITY): Payer: Medicare HMO

## 2016-10-23 ENCOUNTER — Encounter (HOSPITAL_COMMUNITY): Payer: Self-pay | Admitting: General Practice

## 2016-10-23 ENCOUNTER — Encounter (HOSPITAL_COMMUNITY)
Admission: RE | Disposition: A | Discharge status: Home / Self Care | Payer: Self-pay | Source: Ambulatory Visit | Attending: Urology

## 2016-10-23 DIAGNOSIS — E7800 Pure hypercholesterolemia, unspecified: Secondary | ICD-10-CM | POA: Insufficient documentation

## 2016-10-23 DIAGNOSIS — Z7982 Long term (current) use of aspirin: Secondary | ICD-10-CM | POA: Insufficient documentation

## 2016-10-23 DIAGNOSIS — N3000 Acute cystitis without hematuria: Secondary | ICD-10-CM | POA: Insufficient documentation

## 2016-10-23 DIAGNOSIS — Z87440 Personal history of urinary (tract) infections: Secondary | ICD-10-CM | POA: Insufficient documentation

## 2016-10-23 DIAGNOSIS — N400 Enlarged prostate without lower urinary tract symptoms: Secondary | ICD-10-CM | POA: Diagnosis not present

## 2016-10-23 DIAGNOSIS — Z87891 Personal history of nicotine dependence: Secondary | ICD-10-CM | POA: Diagnosis not present

## 2016-10-23 DIAGNOSIS — J439 Emphysema, unspecified: Secondary | ICD-10-CM | POA: Insufficient documentation

## 2016-10-23 DIAGNOSIS — N201 Calculus of ureter: Secondary | ICD-10-CM | POA: Diagnosis not present

## 2016-10-23 DIAGNOSIS — N138 Other obstructive and reflux uropathy: Secondary | ICD-10-CM | POA: Insufficient documentation

## 2016-10-23 DIAGNOSIS — Z79899 Other long term (current) drug therapy: Secondary | ICD-10-CM | POA: Diagnosis not present

## 2016-10-23 DIAGNOSIS — N401 Enlarged prostate with lower urinary tract symptoms: Secondary | ICD-10-CM | POA: Diagnosis not present

## 2016-10-23 DIAGNOSIS — I10 Essential (primary) hypertension: Secondary | ICD-10-CM | POA: Insufficient documentation

## 2016-10-23 HISTORY — PX: EXTRACORPOREAL SHOCK WAVE LITHOTRIPSY: SHX1557

## 2016-10-23 HISTORY — DX: Disorder of kidney and ureter, unspecified: N28.9

## 2016-10-23 SURGERY — LITHOTRIPSY, ESWL
Anesthesia: LOCAL | Laterality: Left

## 2016-10-23 MED ORDER — DIPHENHYDRAMINE HCL 25 MG PO CAPS
25.0000 mg | ORAL_CAPSULE | ORAL | Status: AC
  Administered 2016-10-23: 25 mg via ORAL
  Filled 2016-10-23: qty 1

## 2016-10-23 MED ORDER — SODIUM CHLORIDE 0.9 % IV SOLN
INTRAVENOUS | Status: DC
  Administered 2016-10-23: 07:00:00 via INTRAVENOUS

## 2016-10-23 MED ORDER — DEXTROSE 5 % IV SOLN
2.0000 g | Freq: Once | INTRAVENOUS | Status: AC
  Administered 2016-10-23: 2 g via INTRAVENOUS
  Filled 2016-10-23 (×2): qty 2

## 2016-10-23 MED ORDER — DIAZEPAM 5 MG PO TABS
10.0000 mg | ORAL_TABLET | ORAL | Status: AC
  Administered 2016-10-23: 10 mg via ORAL
  Filled 2016-10-23: qty 2
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
## 2016-11-07 DIAGNOSIS — N132 Hydronephrosis with renal and ureteral calculous obstruction: Secondary | ICD-10-CM | POA: Diagnosis not present

## 2016-11-15 ENCOUNTER — Ambulatory Visit: Payer: Medicare HMO | Admitting: Internal Medicine

## 2016-11-15 ENCOUNTER — Encounter: Payer: Self-pay | Admitting: Internal Medicine

## 2016-11-15 ENCOUNTER — Ambulatory Visit (INDEPENDENT_AMBULATORY_CARE_PROVIDER_SITE_OTHER)
Admission: RE | Admit: 2016-11-15 | Discharge: 2016-11-15 | Disposition: A | Discharge status: Home / Self Care | Payer: Medicare HMO | Source: Ambulatory Visit | Attending: Internal Medicine | Admitting: Internal Medicine

## 2016-11-15 VITALS — BP 118/72 | HR 83 | Temp 97.8°F | Ht 67.0 in | Wt 154.0 lb

## 2016-11-15 DIAGNOSIS — Z23 Encounter for immunization: Secondary | ICD-10-CM | POA: Diagnosis not present

## 2016-11-15 DIAGNOSIS — I10 Essential (primary) hypertension: Secondary | ICD-10-CM | POA: Diagnosis not present

## 2016-11-15 DIAGNOSIS — R7302 Impaired glucose tolerance (oral): Secondary | ICD-10-CM

## 2016-11-15 DIAGNOSIS — R05 Cough: Secondary | ICD-10-CM

## 2016-11-15 DIAGNOSIS — R059 Cough, unspecified: Secondary | ICD-10-CM

## 2016-11-15 DIAGNOSIS — J441 Chronic obstructive pulmonary disease with (acute) exacerbation: Secondary | ICD-10-CM

## 2016-11-15 MED ORDER — CEFTRIAXONE SODIUM 1 G IJ SOLR
1.0000 g | Freq: Once | INTRAMUSCULAR | Status: AC
  Administered 2016-11-15: 1 g via INTRAMUSCULAR

## 2016-11-15 MED ORDER — PREDNISONE 10 MG PO TABS
ORAL_TABLET | ORAL | 0 refills | Status: DC
Start: 2016-11-15 — End: 2016-12-26

## 2016-11-15 MED ORDER — LEVOFLOXACIN 250 MG PO TABS: 250.0000 mg | ORAL_TABLET | Freq: Every day | ORAL | 0 refills | Status: AC

## 2016-11-15 MED ORDER — HYDROCODONE-HOMATROPINE 5-1.5 MG/5ML PO SYRP: 5.0000 mL | ORAL_SOLUTION | Freq: Four times a day (QID) | ORAL | 0 refills | Status: DC | PRN

## 2016-11-15 MED ORDER — HYDROCODONE-HOMATROPINE 5-1.5 MG/5ML PO SYRP: 5.0000 mL | ORAL_SOLUTION | Freq: Four times a day (QID) | ORAL | 0 refills | Status: AC | PRN

## 2016-11-15 MED ORDER — METHYLPREDNISOLONE ACETATE 80 MG/ML IJ SUSP
80.0000 mg | Freq: Once | INTRAMUSCULAR | Status: AC
Start: 2016-11-15 — End: 2016-11-15
  Administered 2016-11-15: 80 mg via INTRAMUSCULAR

## 2016-11-15 NOTE — Patient Instructions (Addendum)
You had the flu shot today, as well as the steroid shot, and antibiotic shot (rocephin)  Please take all new medication as prescribed - the pill antibiotic, cough medicine, and prednisone  Please continue all other medications as before, and refills have been done if requested.  Please have the pharmacy call with any other refills you may need.  Please keep your appointments with your specialists as you may have planned  Please go to the XRAY Department in the Basement (go straight as you get off the elevator) for the x-ray testing  You will be contacted by phone if any changes need to be made immediately.  Otherwise, you will receive a letter about your results with an explanation, but please check with MyChart first.  Please remember to sign up for MyChart if you have not done so, as this will be important to you in the future with finding out test results, communicating by private email, and scheduling acute appointments online when needed.

## 2016-11-15 NOTE — Progress Notes (Signed)
Subjective:    Patient ID: Justin Walters, male    DOB: 03-04-1940, 76 y.o.   MRN: [id number]  HPI  Here with acute onset mild to mod 2-3 days ST, HA, general weakness and malaise, with prod cough greenish sputum, but Pt denies chest pain, increased sob or doe, wheezing, orthopnea, PND, increased LE swelling, palpitations, dizziness or syncope, except for 2-3 days onset mild wheezing, sob.   Pt denies polydipsia, polyuria Pt denies new neurological symptoms such as new headache, or facial or extremity weakness or numbness   Past Medical History:  Diagnosis Date  . At risk for sleep apnea    STOP-BANG= 5    SENT TO PCP 06-13-2013  . Bladder stone   . BPH (benign prostatic hyperplasia)   . COPD (chronic obstructive pulmonary disease) ([place])   . Diverticulosis of colon   . Dysuria   . Glucose intolerance (impaired glucose tolerance)   . History of kidney stones   . Hyperlipidemia   . Hypertension   . Ureteral disorder 2018   ureteral repair   Past Surgical History:  Procedure Laterality Date  . APPENDECTOMY  1980's  . CATARACT EXTRACTION W/ INTRAOCULAR LENS  IMPLANT, BILATERAL    . COLONOSCOPY  10-05-2003   tics only   . CYSTOSCOPY WITH LITHOLAPAXY, BLADDER EXPLORATION, REPAIR BLADDER PERFORATION N/A 06/23/2013   Performed by Arvil Persons, MD at [facility]  . HOLMIUM LASER APPLICATION N/A 8/85/0277   Performed by Arvil Persons, MD at [facility]  . LAPAROSCOPIC INGUINAL HERNIA REPAIR Bilateral 10-09-2003  . LEFT EXTRACORPOREAL SHOCK WAVE LITHOTRIPSY (ESWL) Left 10/23/2016   Performed by Nickie Retort, MD at [facility]  . TRANSURETHRAL RESECTION OF THE PROSTATE N/A 06/23/2013   Performed by Arvil Persons, MD at [facility]    reports that he quit smoking about 23 years ago. His smoking use included cigarettes. He has a 102.00 pack-year smoking history. he has never used smokeless tobacco. He reports that he drinks about 2.4 oz of alcohol per  week. He reports that he does not use drugs. family history includes COPD in his mother; Cancer in his father; Emphysema in his brother; Heart failure in his brother. No Known Allergies Current Outpatient Medications on File Prior to Visit  Medication Sig Dispense Refill  . albuterol (PROVENTIL HFA;VENTOLIN HFA) 108 (90 Base) MCG/ACT inhaler Inhale 2 puffs into the lungs every 6 (six) hours as needed for wheezing or shortness of breath. 3 Inhaler 3  . amLODipine (NORVASC) 5 MG tablet Take 1 tablet (5 mg total) by mouth daily. 90 tablet 3  . aspirin 81 MG EC tablet Take 81 mg by mouth daily. Swallow whole.    . benzonatate (TESSALON PERLES) 100 MG capsule 1-2 tab by mouth every 6 hrs as needed for cough 60 capsule 0  . budesonide-formoterol (SYMBICORT) 160-4.5 MCG/ACT inhaler Inhale 2 puffs into the lungs 2 (two) times daily. 3 Inhaler 3  . carvedilol (COREG) 12.5 MG tablet TAKE 1 TABLET TWICE DAILY WITH MEALS 180 tablet 2  . dextromethorphan-guaiFENesin (MUCINEX DM) 30-600 MG 12hr tablet Take 1 tablet by mouth 2 (two) times daily.    . DiphenhydrAMINE HCl, Sleep, (ZZZQUIL PO) Take by mouth.    . gabapentin (NEURONTIN) 300 MG capsule TAKE 1 CAPSULE THREE TIMES DAILY 270 capsule 3  . Multiple Vitamin (MULTIVITAMIN) tablet Take 1 tablet by mouth daily.    . rosuvastatin (CRESTOR) 20 MG tablet TAKE  1 TABLET EVERY DAY 90 tablet 2  . sildenafil (VIAGRA) 100 MG tablet Take 0.5-1 tablets (50-100 mg total) by mouth daily as needed for erectile dysfunction. 5 tablet 11  . terazosin (HYTRIN) 10 MG capsule TAKE 1 CAPSULE AT BEDTIME 90 capsule 2  . tiotropium (SPIRIVA HANDIHALER) 18 MCG inhalation capsule Place 1 capsule (18 mcg total) into inhaler and inhale daily. 90 capsule 3   No current facility-administered medications on file prior to visit.    Review of Systems  Constitutional: Negative for other unusual diaphoresis or sweats HENT: Negative for ear discharge or swelling Eyes: Negative for other  worsening visual disturbances Respiratory: Negative for stridor or other swelling  Gastrointestinal: Negative for worsening distension or other blood Genitourinary: Negative for retention or other urinary change Musculoskeletal: Negative for other MSK pain or swelling Skin: Negative for color change or other new lesions Neurological: Negative for worsening tremors and other numbness  Psychiatric/Behavioral: Negative for worsening agitation or other fatigue All other system neg per pt    Objective:   Physical Exam BP 118/72   Pulse 83   Temp 97.8 F (36.6 C) (Oral)   Ht 5\' 7"  (1.702 m)   Wt 154 lb (69.9 kg)   SpO2 95%   BMI 24.12 kg/m  \VS noted, mild ill Constitutional: Pt appears in NAD HENT: Head: NCAT.  Right Ear: External ear normal.  Left Ear: External ear normal.  Eyes: . Pupils are equal, round, and reactive to light. Conjunctivae and EOM are normal Nose: without d/c or deformity Neck: Neck supple. Gross normal ROM Cardiovascular: Normal rate and regular rhythm.   Pulmonary/Chest: Effort normal and breath sounds decreased without rales but with few bilat scattered wheezing.  Neurological: Pt is alert. At baseline orientation, motor grossly intact Skin: Skin is warm. No rashes, other new lesions, no LE edema Psychiatric: Pt behavior is normal without agitation  No other exam findings    Assessment & Plan:

## 2016-11-18 NOTE — Assessment & Plan Note (Addendum)
Mild to mod, for cxr, for depomedrol IM 80, predpac asd, cont inhalers,  to f/u any worsening symptoms or concerns

## 2016-11-18 NOTE — Assessment & Plan Note (Signed)
stable overall by history and exam, recent data reviewed with pt, and pt to continue medical treatment as before,  to f/u any worsening symptoms or concerns Lab Results  Component Value Date   HGBA1C 6.1 04/21/2015  pt to call for onset polys or cbg > 200

## 2016-11-18 NOTE — Assessment & Plan Note (Signed)
stable overall by history and exam, recent data reviewed with pt, and pt to continue medical treatment as before,  to f/u any worsening symptoms or concerns BP Readings from Last 3 Encounters:  11/15/16 118/72  10/23/16 101/62  09/19/16 122/82

## 2016-11-18 NOTE — Assessment & Plan Note (Addendum)
Mild to mod, for rocephin IM 1 gm,  antibx course, cough med prn,  to f/u any worsening symptoms or concerns

## 2016-12-04 ENCOUNTER — Other Ambulatory Visit: Payer: Self-pay | Admitting: Internal Medicine

## 2016-12-20 DIAGNOSIS — N201 Calculus of ureter: Secondary | ICD-10-CM | POA: Diagnosis not present

## 2016-12-25 ENCOUNTER — Other Ambulatory Visit: Payer: Self-pay

## 2016-12-25 ENCOUNTER — Emergency Department (HOSPITAL_COMMUNITY): Payer: Medicare HMO

## 2016-12-25 ENCOUNTER — Observation Stay (HOSPITAL_COMMUNITY)
Admission: EM | Admit: 2016-12-25 | Discharge: 2016-12-26 | Disposition: A | Discharge status: Home / Self Care | Payer: Medicare HMO | Attending: Internal Medicine | Admitting: Internal Medicine

## 2016-12-25 ENCOUNTER — Encounter (HOSPITAL_COMMUNITY): Payer: Self-pay

## 2016-12-25 DIAGNOSIS — J441 Chronic obstructive pulmonary disease with (acute) exacerbation: Principal | ICD-10-CM | POA: Diagnosis present

## 2016-12-25 DIAGNOSIS — N183 Chronic kidney disease, stage 3 unspecified: Secondary | ICD-10-CM | POA: Diagnosis present

## 2016-12-25 DIAGNOSIS — Z87891 Personal history of nicotine dependence: Secondary | ICD-10-CM | POA: Diagnosis not present

## 2016-12-25 DIAGNOSIS — E785 Hyperlipidemia, unspecified: Secondary | ICD-10-CM | POA: Diagnosis not present

## 2016-12-25 DIAGNOSIS — Z7982 Long term (current) use of aspirin: Secondary | ICD-10-CM | POA: Diagnosis not present

## 2016-12-25 DIAGNOSIS — Z79899 Other long term (current) drug therapy: Secondary | ICD-10-CM | POA: Diagnosis not present

## 2016-12-25 DIAGNOSIS — N189 Chronic kidney disease, unspecified: Secondary | ICD-10-CM | POA: Diagnosis not present

## 2016-12-25 DIAGNOSIS — R0602 Shortness of breath: Secondary | ICD-10-CM | POA: Diagnosis not present

## 2016-12-25 DIAGNOSIS — I129 Hypertensive chronic kidney disease with stage 1 through stage 4 chronic kidney disease, or unspecified chronic kidney disease: Secondary | ICD-10-CM | POA: Diagnosis not present

## 2016-12-25 DIAGNOSIS — I10 Essential (primary) hypertension: Secondary | ICD-10-CM | POA: Diagnosis not present

## 2016-12-25 LAB — CBC
HEMATOCRIT: 43.6 % (ref 39.0–52.0)
HEMOGLOBIN: 14.2 g/dL (ref 13.0–17.0)
MCH: 29.2 pg (ref 26.0–34.0)
MCHC: 32.6 g/dL (ref 30.0–36.0)
MCV: 89.5 fL (ref 78.0–100.0)
Platelets: 159 10*3/uL (ref 150–400)
RBC: 4.87 MIL/uL (ref 4.22–5.81)
RDW: 14.4 % (ref 11.5–15.5)
WBC: 7.2 10*3/uL (ref 4.0–10.5)

## 2016-12-25 LAB — COMPREHENSIVE METABOLIC PANEL
ALBUMIN: 4.2 g/dL (ref 3.5–5.0)
ALK PHOS: 53 U/L (ref 38–126)
ALT: 14 U/L — AB (ref 17–63)
AST: 21 U/L (ref 15–41)
Anion gap: 8 (ref 5–15)
BILIRUBIN TOTAL: 1 mg/dL (ref 0.3–1.2)
BUN: 22 mg/dL — AB (ref 6–20)
CALCIUM: 9 mg/dL (ref 8.9–10.3)
CO2: 30 mmol/L (ref 22–32)
CREATININE: 1.26 mg/dL — AB (ref 0.61–1.24)
Chloride: 103 mmol/L (ref 101–111)
GFR calc Af Amer: >60 mL/min (ref >60)
GFR, EST NON AFRICAN AMERICAN: 54 mL/min — AB (ref >60)
GLUCOSE: 99 mg/dL (ref 65–99)
Potassium: 4 mmol/L (ref 3.5–5.1)
Sodium: 141 mmol/L (ref 135–145)
TOTAL PROTEIN: 7.6 g/dL (ref 6.5–8.1)

## 2016-12-25 LAB — I-STAT TROPONIN, ED: TROPONIN I, POC: 0 ng/mL (ref 0.00–0.08)

## 2016-12-25 LAB — BRAIN NATRIURETIC PEPTIDE: B Natriuretic Peptide: 116 pg/mL — ABNORMAL HIGH (ref 0.0–100.0)

## 2016-12-25 MED ORDER — PREDNISONE 20 MG PO TABS
20.0000 mg | ORAL_TABLET | Freq: Two times a day (BID) | ORAL | Status: DC
  Administered 2016-12-25 – 2016-12-26 (×2): 20 mg via ORAL
  Filled 2016-12-25 (×2): qty 1

## 2016-12-25 MED ORDER — AZITHROMYCIN 250 MG PO TABS
250.0000 mg | ORAL_TABLET | Freq: Every day | ORAL | Status: DC
  Administered 2016-12-26: 250 mg via ORAL
  Filled 2016-12-25: qty 1

## 2016-12-25 MED ORDER — IPRATROPIUM-ALBUTEROL 0.5-2.5 (3) MG/3ML IN SOLN
3.0000 mL | Freq: Four times a day (QID) | RESPIRATORY_TRACT | Status: DC
  Administered 2016-12-26 (×2): 3 mL via RESPIRATORY_TRACT
  Filled 2016-12-25 (×2): qty 3

## 2016-12-25 MED ORDER — SODIUM CHLORIDE 0.9% FLUSH
3.0000 mL | Freq: Two times a day (BID) | INTRAVENOUS | Status: DC
  Administered 2016-12-25: 3 mL via INTRAVENOUS

## 2016-12-25 MED ORDER — ASPIRIN EC 81 MG PO TBEC
81.0000 mg | DELAYED_RELEASE_TABLET | Freq: Every day | ORAL | Status: DC
  Administered 2016-12-25 – 2016-12-26 (×2): 81 mg via ORAL
  Filled 2016-12-25 (×2): qty 1

## 2016-12-25 MED ORDER — MORPHINE SULFATE (PF) 4 MG/ML IV SOLN: 4.0000 mg | Freq: Once | INTRAVENOUS | Status: DC

## 2016-12-25 MED ORDER — SODIUM CHLORIDE 0.9% FLUSH: 3.0000 mL | INTRAVENOUS | Status: DC | PRN

## 2016-12-25 MED ORDER — AMLODIPINE BESYLATE 5 MG PO TABS
5.0000 mg | ORAL_TABLET | Freq: Every day | ORAL | Status: DC
  Administered 2016-12-25 – 2016-12-26 (×2): 5 mg via ORAL
  Filled 2016-12-25 (×2): qty 1

## 2016-12-25 MED ORDER — CARVEDILOL 12.5 MG PO TABS
12.5000 mg | ORAL_TABLET | Freq: Two times a day (BID) | ORAL | Status: DC
  Administered 2016-12-25 – 2016-12-26 (×2): 12.5 mg via ORAL
  Filled 2016-12-25 (×2): qty 1

## 2016-12-25 MED ORDER — ALBUTEROL SULFATE (2.5 MG/3ML) 0.083% IN NEBU: 2.5000 mg | INHALATION_SOLUTION | RESPIRATORY_TRACT | Status: DC | PRN

## 2016-12-25 MED ORDER — ROSUVASTATIN CALCIUM 20 MG PO TABS
20.0000 mg | ORAL_TABLET | Freq: Every day | ORAL | Status: DC
  Administered 2016-12-25 – 2016-12-26 (×2): 20 mg via ORAL
  Filled 2016-12-25 (×2): qty 1

## 2016-12-25 MED ORDER — ALBUTEROL SULFATE (2.5 MG/3ML) 0.083% IN NEBU
5.0000 mg | INHALATION_SOLUTION | Freq: Once | RESPIRATORY_TRACT | Status: AC
  Administered 2016-12-25: 5 mg via RESPIRATORY_TRACT
  Filled 2016-12-25: qty 6

## 2016-12-25 MED ORDER — ALBUTEROL (5 MG/ML) CONTINUOUS INHALATION SOLN
10.0000 mg/h | INHALATION_SOLUTION | Freq: Once | RESPIRATORY_TRACT | Status: AC
  Administered 2016-12-25: 10 mg/h via RESPIRATORY_TRACT
  Filled 2016-12-25: qty 20

## 2016-12-25 MED ORDER — AZITHROMYCIN 250 MG PO TABS
500.0000 mg | ORAL_TABLET | Freq: Every day | ORAL | Status: AC
  Administered 2016-12-25: 500 mg via ORAL
  Filled 2016-12-25: qty 2

## 2016-12-25 MED ORDER — IPRATROPIUM BROMIDE 0.02 % IN SOLN
0.5000 mg | Freq: Once | RESPIRATORY_TRACT | Status: AC
  Administered 2016-12-25: 0.5 mg via RESPIRATORY_TRACT

## 2016-12-25 MED ORDER — ALBUTEROL SULFATE (2.5 MG/3ML) 0.083% IN NEBU: 2.5000 mg | INHALATION_SOLUTION | Freq: Four times a day (QID) | RESPIRATORY_TRACT | Status: DC

## 2016-12-25 MED ORDER — METHYLPREDNISOLONE SODIUM SUCC 125 MG IJ SOLR
125.0000 mg | Freq: Once | INTRAMUSCULAR | Status: AC
  Administered 2016-12-25: 125 mg via INTRAVENOUS
  Filled 2016-12-25: qty 2

## 2016-12-25 MED ORDER — SODIUM CHLORIDE 0.9 % IV SOLN: 250.0000 mL | INTRAVENOUS | Status: DC | PRN

## 2016-12-25 MED ORDER — AEROCHAMBER Z-STAT PLUS/MEDIUM MISC
1.0000 | Freq: Once | Status: AC
  Administered 2016-12-25: 1
  Filled 2016-12-25: qty 1

## 2016-12-25 MED ORDER — TERAZOSIN HCL 5 MG PO CAPS: 10.0000 mg | ORAL_CAPSULE | Freq: Every day | ORAL | Status: DC

## 2016-12-25 MED ORDER — GUAIFENESIN ER 600 MG PO TB12
600.0000 mg | ORAL_TABLET | Freq: Two times a day (BID) | ORAL | Status: DC
  Administered 2016-12-25 – 2016-12-26 (×2): 600 mg via ORAL
  Filled 2016-12-25 (×2): qty 1

## 2016-12-25 MED ORDER — TERAZOSIN HCL 5 MG PO CAPS
10.0000 mg | ORAL_CAPSULE | Freq: Every day | ORAL | Status: DC
  Administered 2016-12-25: 10 mg via ORAL
  Filled 2016-12-25: qty 2

## 2016-12-25 MED ORDER — IPRATROPIUM BROMIDE 0.02 % IN SOLN: 0.5000 mg | Freq: Four times a day (QID) | RESPIRATORY_TRACT | Status: DC

## 2016-12-25 MED ORDER — IPRATROPIUM-ALBUTEROL 0.5-2.5 (3) MG/3ML IN SOLN
3.0000 mL | Freq: Four times a day (QID) | RESPIRATORY_TRACT | Status: DC
  Administered 2016-12-25: 3 mL via RESPIRATORY_TRACT
  Filled 2016-12-25: qty 3

## 2016-12-25 NOTE — ED Notes (Addendum)
Patient O2 sat 89% on room air after continuous nebulizer treatment. Placed patient back on 1L [place] and O2 sat came up to 92%. Will notify Mia, PA.

## 2016-12-25 NOTE — ED Notes (Signed)
Patient O2 sat 89-90% on 1L [place]. Placed patient on 2L O2 and O2 sat now 93-94%. Will continue to monitor.

## 2016-12-25 NOTE — ED Notes (Signed)
ED Provider at bedside. 

## 2016-12-25 NOTE — H&P (Signed)
History and Physical    RUFINO STAUP WNI:[id number] DOB: [birth year] DOA: 12/25/2016  PCP: Biagio Borg, MD  Patient coming from:  home  Chief Complaint:  Wheezing, sob  HPI: Justin Walters is a 76 y.o. male with medical history significant of copd comes in with several days of wheezing and sob.  No fevers.  Coughing a lot.  No le edema or swelling.  No chesst pain.  Pt referred for admission for copd.  He [place] feels much better.  His sats were low initially on RA at upper 80s.  Better on 2 ltiers oxygen.  Review of Systems: As per HPI otherwise 10 point review of systems negative.   Past Medical History:  Diagnosis Date  . At risk for sleep apnea    STOP-BANG= 5    SENT TO PCP 06-13-2013  . Bladder stone   . BPH (benign prostatic hyperplasia)   . COPD (chronic obstructive pulmonary disease) ([place])   . Diverticulosis of colon   . Dysuria   . Glucose intolerance (impaired glucose tolerance)   . History of kidney stones   . Hyperlipidemia   . Hypertension   . Ureteral disorder 2018   ureteral repair    Past Surgical History:  Procedure Laterality Date  . APPENDECTOMY  1980's  . CATARACT EXTRACTION W/ INTRAOCULAR LENS  IMPLANT, BILATERAL    . COLONOSCOPY  10-05-2003   tics only   . CYSTOSCOPY N/A 06/23/2013   Procedure: CYSTOSCOPY WITH LITHOLAPAXY, BLADDER EXPLORATION, REPAIR BLADDER PERFORATION;  Surgeon: Arvil Persons, MD;  Location: [facility];  Service: Urology;  Laterality: N/A;  . EXTRACORPOREAL SHOCK WAVE LITHOTRIPSY Left 10/23/2016   Procedure: LEFT EXTRACORPOREAL SHOCK WAVE LITHOTRIPSY (ESWL);  Surgeon: Nickie Retort, MD;  Location: [facility];  Service: Urology;  Laterality: Left;  . HOLMIUM LASER APPLICATION N/A 3/81/8299   Procedure: HOLMIUM LASER APPLICATION;  Surgeon: Arvil Persons, MD;  Location: [facility];  Service: Urology;  Laterality: N/A;  . LAPAROSCOPIC INGUINAL HERNIA REPAIR Bilateral 10-09-2003  .  TRANSURETHRAL RESECTION OF PROSTATE N/A 06/23/2013   Procedure: TRANSURETHRAL RESECTION OF THE PROSTATE;  Surgeon: Arvil Persons, MD;  Location: [facility];  Service: Urology;  Laterality: N/A;     reports that he quit smoking about 23 years ago. His smoking use included cigarettes. He has a 102.00 pack-year smoking history. he has never used smokeless tobacco. He reports that he drinks about 2.4 oz of alcohol per week. He reports that he does not use drugs.  No Known Allergies  Family History  Problem Relation Age of Onset  . COPD Mother   . Cancer Father        unknown  . Emphysema Brother   . Heart failure Brother   . Colon cancer Neg Hx   . Esophageal cancer Neg Hx   . Rectal cancer Neg Hx   . Stomach cancer Neg Hx     Prior to Admission medications   Medication Sig Start Date End Date Taking? Authorizing Provider  albuterol (PROVENTIL HFA;VENTOLIN HFA) 108 (90 Base) MCG/ACT inhaler Inhale 2 puffs into the lungs every 6 (six) hours as needed for wheezing or shortness of breath. 07/19/16  Yes Biagio Borg, MD  amLODipine (NORVASC) 5 MG tablet Take 1 tablet (5 mg total) by mouth daily. 01/04/16 01/03/17 Yes Biagio Borg, MD  aspirin 81 MG EC tablet Take 81 mg by mouth daily. Swallow  whole.   Yes Biagio Borg, MD  carvedilol (COREG) 12.5 MG tablet TAKE 1 TABLET TWICE DAILY WITH MEALS 12/05/16  Yes Biagio Borg, MD  DiphenhydrAMINE HCl, Sleep, (ZZZQUIL PO) Take by mouth.   Yes [provider]  gabapentin (NEURONTIN) 300 MG capsule TAKE 1 CAPSULE THREE TIMES DAILY 08/22/16  Yes Biagio Borg, MD  predniSONE (DELTASONE) 10 MG tablet 3 tabs by mouth per day for 3 days,2tabs per day for 3 days,1tab per day for 3 days 11/15/16  Yes Biagio Borg, MD  rosuvastatin (CRESTOR) 20 MG tablet TAKE 1 TABLET EVERY DAY 12/05/16  Yes Biagio Borg, MD  terazosin (HYTRIN) 10 MG capsule TAKE 1 CAPSULE AT BEDTIME 12/05/16  Yes Biagio Borg, MD  benzonatate (TESSALON PERLES) 100 MG  capsule 1-2 tab by mouth every 6 hrs as needed for cough Patient not taking: Reported on 12/25/2016 09/19/16   Biagio Borg, MD  budesonide-formoterol [facility]) 160-4.5 MCG/ACT inhaler Inhale 2 puffs into the lungs 2 (two) times daily. Patient not taking: Reported on 12/25/2016 07/19/16   Biagio Borg, MD  sildenafil (VIAGRA) 100 MG tablet Take 0.5-1 tablets (50-100 mg total) by mouth daily as needed for erectile dysfunction. Patient not taking: Reported on 12/25/2016 05/18/15   Biagio Borg, MD  tiotropium (SPIRIVA HANDIHALER) 18 MCG inhalation capsule Place 1 capsule (18 mcg total) into inhaler and inhale daily. Patient not taking: Reported on 12/25/2016 07/19/16   Biagio Borg, MD    Physical Exam: Vitals:   12/25/16 1327 12/25/16 1328 12/25/16 1329 12/25/16 1530  BP:    130/72  Pulse: 91 98 99 72  Resp:    16  Temp:      TempSrc:      SpO2: (!) 88% 90% 91% 95%      Constitutional: NAD, calm, comfortable Vitals:   12/25/16 1327 12/25/16 1328 12/25/16 1329 12/25/16 1530  BP:    130/72  Pulse: 91 98 99 72  Resp:    16  Temp:      TempSrc:      SpO2: (!) 88% 90% 91% 95%   Eyes: PERRL, lids and conjunctivae normal ENMT: Mucous membranes are moist. Posterior pharynx clear of any exudate or lesions.Normal dentition.  Neck: normal, supple, no masses, no thyromegaly Respiratory: clear to auscultation bilaterally, no wheezing, no crackles. Normal respiratory effort. No accessory muscle use.  Cardiovascular: Regular rate and rhythm, no murmurs / rubs / gallops. No extremity edema. 2+ pedal pulses. No carotid bruits.  Abdomen: no tenderness, no masses palpated. No hepatosplenomegaly. Bowel sounds positive.  Musculoskeletal: no clubbing / cyanosis. No joint deformity upper and lower extremities. Good ROM, no contractures. Normal muscle tone.  Skin: no rashes, lesions, ulcers. No induration Neurologic: CN 2-12 grossly intact. Sensation intact, DTR normal. Strength 5/5 in all 4.    Psychiatric: Normal judgment and insight. Alert and oriented x 3. Normal mood.    Labs on Admission: I have personally reviewed following labs and imaging studies  CBC: Recent Labs  Lab 12/25/16 1425  WBC 7.2  HGB 14.2  HCT 43.6  MCV 89.5  PLT 034   Basic Metabolic Panel: Recent Labs  Lab 12/25/16 1425  NA 141  K 4.0  CL 103  CO2 30  GLUCOSE 99  BUN 22*  CREATININE 1.26*  CALCIUM 9.0   GFR: CrCl cannot be calculated (Unknown ideal weight.). Liver Function Tests: Recent Labs  Lab 12/25/16 1425  AST 21  ALT 14*  ALKPHOS 53  BILITOT 1.0  PROT 7.6  ALBUMIN 4.2   No results for input(s): LIPASE, AMYLASE in the last 168 hours. No results for input(s): AMMONIA in the last 168 hours. Coagulation Profile: No results for input(s): INR, PROTIME in the last 168 hours. Cardiac Enzymes: No results for input(s): CKTOTAL, CKMB, CKMBINDEX, TROPONINI in the last 168 hours. BNP (last 3 results) No results for input(s): PROBNP in the last 8760 hours. HbA1C: No results for input(s): HGBA1C in the last 72 hours. CBG: No results for input(s): GLUCAP in the last 168 hours. Lipid Profile: No results for input(s): CHOL, HDL, LDLCALC, TRIG, CHOLHDL, LDLDIRECT in the last 72 hours. Thyroid Function Tests: No results for input(s): TSH, T4TOTAL, FREET4, T3FREE, THYROIDAB in the last 72 hours. Anemia Panel: No results for input(s): VITAMINB12, FOLATE, FERRITIN, TIBC, IRON, RETICCTPCT in the last 72 hours. Urine analysis:    Component Value Date/Time   COLORURINE YELLOW 08/15/2016 1606   APPEARANCEUR Cloudy (A) 08/15/2016 1606   LABSPEC 1.020 08/15/2016 1606   PHURINE 6.5 08/15/2016 1606   GLUCOSEU NEGATIVE 08/15/2016 1606   HGBUR MODERATE (A) 08/15/2016 1606   HGBUR moderate 07/26/2007 0924   BILIRUBINUR NEGATIVE 08/15/2016 1606   KETONESUR NEGATIVE 08/15/2016 1606   PROTEINUR 30 (A) 08/04/2013 0620   UROBILINOGEN 0.2 08/15/2016 1606   NITRITE NEGATIVE 08/15/2016 1606    LEUKOCYTESUR LARGE (A) 08/15/2016 1606   Sepsis Labs: !!!!!!!!!!!!!!!!!!!!!!!!!!!!!!!!!!!!!!!!!!!! @LABRCNTIP (procalcitonin:4,lacticidven:4) )No results found for this or any previous visit (from the past 240 hour(s)).   Radiological Exams on Admission: Dg Chest 2 View  Result Date: 12/25/2016 CLINICAL DATA:  Worsening of chronic shortness of breath. History of COPD. EXAM: CHEST  2 VIEW COMPARISON:  PA and lateral chest 11/15/2016 and 09/30/2015. FINDINGS: The chest is hyperexpanded with attenuation of the pulmonary vasculature. Lungs are clear. No pneumothorax or pleural effusion. Heart size is normal. Aortic atherosclerosis noted. No focal bony abnormality. IMPRESSION: No acute disease. Emphysema. Atherosclerosis. Electronically Signed   By: Inge Rise M.D.   On: 12/25/2016 09:56     Assessment/Plan 76 yo male with copde  Principal Problem:   COPD exacerbation ([place])- wheezing already resolved.  Wean oxygen.  obs on med.  Steroids.  Zpack.  freq nebs.  Active Problems:   Hyperlipidemia- cont home meds   Essential hypertension- cont home meds   CKD (chronic kidney disease)- stable   DVT prophylaxis:  scds Code Status:  full Family Communication:  none Disposition Plan:  Per day team Consults called:  none Admission status:  observation   Kristiann Noyce A MD [facility]  If 7PM-7AM, please contact night-coverage [email or web address] Password [facility]  12/25/2016, 4:38 PM

## 2016-12-25 NOTE — ED Triage Notes (Signed)
He states he has worsening chronic shortness of breath and is having more trouble sleeping than usual. He states he has COPD and is "always short of breath". He is short of breath, but not dyspneic.

## 2016-12-25 NOTE — ED Notes (Signed)
Patient tolerated breathing treatment well. Resting comfortably on 4L [place]

## 2016-12-25 NOTE — ED Notes (Signed)
Pt placed on 2L of oxygen due to o2 being 87% on room air.

## 2016-12-25 NOTE — ED Notes (Signed)
Respiratory made aware of need for continuous neb.

## 2016-12-25 NOTE — ED Notes (Signed)
ED TO INPATIENT HANDOFF REPORT  Name/Age/Gender Justin Walters 76 y.o. male  Code Status Code Status History    This patient does not have a recorded code status. Please follow your organizational policy for patients in this situation.      Home/SNF/Other Home  Chief Complaint shortness of breath/wheezing  Level of Care/Admitting Diagnosis ED Disposition    ED Disposition Condition Comment   Admit  Hospital Area: [facility] [[id number]]  Level of Care: Med-Surg [16]  Diagnosis: COPD exacerbation [facility]) [[id number]]  Admitting Physician: Phillips Grout [[id number]]  Attending Physician: Derrill Kay A [[id number]]  PT Class (Do Not Modify): Observation [104]  PT Acc Code (Do Not Modify): Observation [10022]       Medical History Past Medical History:  Diagnosis Date  . At risk for sleep apnea    STOP-BANG= 5    SENT TO PCP 06-13-2013  . Bladder stone   . BPH (benign prostatic hyperplasia)   . COPD (chronic obstructive pulmonary disease) ([place])   . Diverticulosis of colon   . Dysuria   . Glucose intolerance (impaired glucose tolerance)   . History of kidney stones   . Hyperlipidemia   . Hypertension   . Ureteral disorder 2018   ureteral repair    Allergies No Known Allergies  IV Location/Drains/Wounds Patient Lines/Drains/Airways Status   Active Line/Drains/Airways    Name:   Placement date:   Placement time:   Site:   Days:   Peripheral IV 12/25/16 Right Antecubital   12/25/16    1430    Antecubital   less than 1   Closed System Drain 1 Midline Abdomen Bulb (JP)   06/23/13    1524    Abdomen   1281   Urethral Catheter   03/29/16    -    -   271   Incision (Closed) 06/23/13 Perineum Other (Comment)   06/23/13    1204     1281   Incision (Closed) 06/23/13 Abdomen Other (Comment)   06/23/13    1451     1281          Labs/Imaging Results for orders placed or performed during the hospital encounter of 12/25/16 (from the past 48 hour(s))  CBC     Status:  None   Collection Time: 12/25/16  2:25 PM  Result Value Ref Range   WBC 7.2 4.0 - 10.5 K/uL   RBC 4.87 4.22 - 5.81 MIL/uL   Hemoglobin 14.2 13.0 - 17.0 g/dL   HCT 43.6 39.0 - 52.0 %   MCV 89.5 78.0 - 100.0 fL   MCH 29.2 26.0 - 34.0 pg   MCHC 32.6 30.0 - 36.0 g/dL   RDW 14.4 11.5 - 15.5 %   Platelets 159 150 - 400 K/uL  Comprehensive metabolic panel     Status: Abnormal   Collection Time: 12/25/16  2:25 PM  Result Value Ref Range   Sodium 141 135 - 145 mmol/L   Potassium 4.0 3.5 - 5.1 mmol/L   Chloride 103 101 - 111 mmol/L   CO2 30 22 - 32 mmol/L   Glucose, Bld 99 65 - 99 mg/dL   BUN 22 (H) 6 - 20 mg/dL   Creatinine, Ser 1.26 (H) 0.61 - 1.24 mg/dL   Calcium 9.0 8.9 - 10.3 mg/dL   Total Protein 7.6 6.5 - 8.1 g/dL   Albumin 4.2 3.5 - 5.0 g/dL   AST 21 15 - 41 U/L   ALT 14 (L)  17 - 63 U/L   Alkaline Phosphatase 53 38 - 126 U/L   Total Bilirubin 1.0 0.3 - 1.2 mg/dL   GFR calc non Af Amer 54 (L) >60 mL/min   GFR calc Af Amer >60 >60 mL/min    Comment: (NOTE) The eGFR has been calculated using the CKD EPI equation. This calculation has not been validated in all clinical situations. eGFR's persistently <60 mL/min signify possible Chronic Kidney Disease.    Anion gap 8 5 - 15  Brain natriuretic peptide     Status: Abnormal   Collection Time: 12/25/16  2:25 PM  Result Value Ref Range   B Natriuretic Peptide 116.0 (H) 0.0 - 100.0 pg/mL  I-Stat Troponin, ED (not at [facility])     Status: None   Collection Time: 12/25/16  2:36 PM  Result Value Ref Range   Troponin i, poc 0.00 0.00 - 0.08 ng/mL   Comment 3            Comment: Due to the release kinetics of cTnI, a negative result within the first hours of the onset of symptoms does not rule out myocardial infarction with certainty. If myocardial infarction is still suspected, repeat the test at appropriate intervals.    Dg Chest 2 View  Result Date: 12/25/2016 CLINICAL DATA:  Worsening of chronic shortness of breath. History of  COPD. EXAM: CHEST  2 VIEW COMPARISON:  PA and lateral chest 11/15/2016 and 09/30/2015. FINDINGS: The chest is hyperexpanded with attenuation of the pulmonary vasculature. Lungs are clear. No pneumothorax or pleural effusion. Heart size is normal. Aortic atherosclerosis noted. No focal bony abnormality. IMPRESSION: No acute disease. Emphysema. Atherosclerosis. Electronically Signed   By: Inge Rise M.D.   On: 12/25/2016 09:56    Pending Labs [employer] (From admission, onward)   Start     Ordered   Signed and Occupational hygienist morning,   R     Signed and Held   Signed and Held  CBC  Tomorrow morning,   R     Signed and Held      Vitals/Pain Today's Vitals   12/25/16 1530 12/25/16 1600 12/25/16 1630 12/25/16 1700  BP: 130/72 127/77 123/76 126/78  Pulse: 72 75 81 81  Resp: 16   16  Temp:      TempSrc:      SpO2: 95% 92% 95% 94%  PainSc:        Isolation Precautions No active isolations  Medications Medications  morphine 4 MG/ML injection 4 mg (4 mg Intravenous Refused 12/25/16 1430)  albuterol (PROVENTIL) (2.5 MG/3ML) 0.083% nebulizer solution 5 mg (5 mg Nebulization Given 12/25/16 1003)  aerochamber Z-Stat Plus/medium 1 each (1 each Other Provided for home use 12/25/16 1221)  albuterol (PROVENTIL,VENTOLIN) solution continuous neb (10 mg/hr Nebulization Given 12/25/16 1205)  ipratropium (ATROVENT) nebulizer solution 0.5 mg (0.5 mg Nebulization Given 12/25/16 1205)  methylPREDNISolone sodium succinate (SOLU-MEDROL) 125 mg/2 mL injection 125 mg (125 mg Intravenous Given 12/25/16 1549)    Mobility walks3

## 2016-12-25 NOTE — ED Notes (Signed)
Patient transported to X-ray 

## 2016-12-25 NOTE — ED Provider Notes (Signed)
[place] [facility] DEPT Provider Note   CSN: [id number] Arrival date & time: 12/25/16  7824     History   Chief Complaint Chief Complaint  Patient presents with  . Shortness of Breath    HPI Justin Walters is a 76 y.o. male with a h/o of COPD, CKD, HTN, and HLD who presents to the Emergency Department for a chief complaint of constant, worse than baseline dyspnea x2 weeks that significantly worsened in the last 24 hours requiring use of his inhalers 4-5 times in the last 24 hours. He reports associated worsening difficulty sleeping over the last few days. He denies chest pain, back pain, fever, chills, N/V/D, abdominal pain, lower extremity edema, HA, lightheadedness, or dizziness. His symptoms are worse with laying flat and improved by nothing.   He was seen by his PCP, Dr. Cathlean Cower on 11/18/16 for similar symptoms and was treated with depomedrol, a PredPac, Rocephin, albuterol, and Spiriva. He was complaint with all medications, except Spiriva because the medication was not available at the pharmacy. He states that his symptoms in November resolved while he was taking antibiotics and steroids, but started to worsen after he completed the medication.   No sick contacts. No recent travel. He is a non-smoker. He quit smoking about 23 years ago. He has a 102 pacj-year smoking history. No home O2 use.   The history is provided by the patient. No language interpreter was used.   Past Medical History:  Diagnosis Date  . At risk for sleep apnea    STOP-BANG= 5    SENT TO PCP 06-13-2013  . Bladder stone   . BPH (benign prostatic hyperplasia)   . COPD (chronic obstructive pulmonary disease) ([place])   . Diverticulosis of colon   . Dysuria   . Glucose intolerance (impaired glucose tolerance)   . History of kidney stones   . Hyperlipidemia   . Hypertension   . Ureteral disorder 2018   ureteral repair    Patient Active Problem List   Diagnosis Date Noted  .  Acute sinus infection 09/19/2016  . Bilateral hearing loss 07/19/2016  . Dizziness 01/04/2016  . Preop exam for internal medicine 01/04/2016  . Cough 04/07/2015  . COPD exacerbation ([place]) 04/07/2015  . Gastroenteritis 01/16/2015  . Skin soreness 05/15/2014  . Pelvic pain in male 03/28/2012  . Headache 03/28/2012  . Chest pain, unspecified 03/28/2012  . Impaired glucose tolerance 01/14/2011  . Encounter for preventative adult health care exam with abnormal findings 01/14/2011  . Encounter for long-term (current) use of other medications 01/09/2011  . BENIGN PROSTATIC HYPERTROPHY 01/08/2009  . ERECTILE DYSFUNCTION 04/07/2008  . CKD (chronic kidney disease) 10/03/2007  . SKIN LESION 10/03/2007  . PSA, INCREASED 10/03/2007  . Dysuria 07/26/2007  . DIVERTICULOSIS, COLON 02/15/2007  . Hyperlipidemia 09/23/2006  . Essential hypertension 09/23/2006  . RENAL CALCULUS, HX OF 09/23/2006  . COPD (chronic obstructive pulmonary disease) ([place]) 08/01/2006    Past Surgical History:  Procedure Laterality Date  . APPENDECTOMY  1980's  . CATARACT EXTRACTION W/ INTRAOCULAR LENS  IMPLANT, BILATERAL    . COLONOSCOPY  10-05-2003   tics only   . CYSTOSCOPY N/A 06/23/2013   Procedure: CYSTOSCOPY WITH LITHOLAPAXY, BLADDER EXPLORATION, REPAIR BLADDER PERFORATION;  Surgeon: Arvil Persons, MD;  Location: [facility];  Service: Urology;  Laterality: N/A;  . EXTRACORPOREAL SHOCK WAVE LITHOTRIPSY Left 10/23/2016   Procedure: LEFT EXTRACORPOREAL SHOCK WAVE LITHOTRIPSY (ESWL);  Surgeon: Nickie Retort, MD;  Location:  [facility];  Service: Urology;  Laterality: Left;  . HOLMIUM LASER APPLICATION N/A 6/38/9373   Procedure: HOLMIUM LASER APPLICATION;  Surgeon: Arvil Persons, MD;  Location: [facility];  Service: Urology;  Laterality: N/A;  . LAPAROSCOPIC INGUINAL HERNIA REPAIR Bilateral 10-09-2003  . TRANSURETHRAL RESECTION OF PROSTATE N/A 06/23/2013   Procedure: TRANSURETHRAL RESECTION OF  THE PROSTATE;  Surgeon: Arvil Persons, MD;  Location: [facility];  Service: Urology;  Laterality: N/A;       Home Medications    Prior to Admission medications   Medication Sig Start Date End Date Taking? Authorizing Provider  albuterol (PROVENTIL HFA;VENTOLIN HFA) 108 (90 Base) MCG/ACT inhaler Inhale 2 puffs into the lungs every 6 (six) hours as needed for wheezing or shortness of breath. 07/19/16  Yes Biagio Borg, MD  amLODipine (NORVASC) 5 MG tablet Take 1 tablet (5 mg total) by mouth daily. 01/04/16 01/03/17 Yes Biagio Borg, MD  aspirin 81 MG EC tablet Take 81 mg by mouth daily. Swallow whole.   Yes Biagio Borg, MD  carvedilol (COREG) 12.5 MG tablet TAKE 1 TABLET TWICE DAILY WITH MEALS 12/05/16  Yes Biagio Borg, MD  DiphenhydrAMINE HCl, Sleep, (ZZZQUIL PO) Take by mouth.   Yes [provider]  gabapentin (NEURONTIN) 300 MG capsule TAKE 1 CAPSULE THREE TIMES DAILY 08/22/16  Yes Biagio Borg, MD  predniSONE (DELTASONE) 10 MG tablet 3 tabs by mouth per day for 3 days,2tabs per day for 3 days,1tab per day for 3 days 11/15/16  Yes Biagio Borg, MD  rosuvastatin (CRESTOR) 20 MG tablet TAKE 1 TABLET EVERY DAY 12/05/16  Yes Biagio Borg, MD  terazosin (HYTRIN) 10 MG capsule TAKE 1 CAPSULE AT BEDTIME 12/05/16  Yes Biagio Borg, MD  benzonatate (TESSALON PERLES) 100 MG capsule 1-2 tab by mouth every 6 hrs as needed for cough Patient not taking: Reported on 12/25/2016 09/19/16   Biagio Borg, MD  budesonide-formoterol [facility]) 160-4.5 MCG/ACT inhaler Inhale 2 puffs into the lungs 2 (two) times daily. Patient not taking: Reported on 12/25/2016 07/19/16   Biagio Borg, MD  sildenafil (VIAGRA) 100 MG tablet Take 0.5-1 tablets (50-100 mg total) by mouth daily as needed for erectile dysfunction. Patient not taking: Reported on 12/25/2016 05/18/15   Biagio Borg, MD  tiotropium (SPIRIVA HANDIHALER) 18 MCG inhalation capsule Place 1 capsule (18 mcg total) into inhaler and  inhale daily. Patient not taking: Reported on 12/25/2016 07/19/16   Biagio Borg, MD    Family History Family History  Problem Relation Age of Onset  . COPD Mother   . Cancer Father        unknown  . Emphysema Brother   . Heart failure Brother   . Colon cancer Neg Hx   . Esophageal cancer Neg Hx   . Rectal cancer Neg Hx   . Stomach cancer Neg Hx     Social History Social History   Tobacco Use  . Smoking status: Former Smoker    Packs/day: 3.00    Years: 34.00    Pack years: 102.00    Types: Cigarettes    Last attempt to quit: 01/02/1993    Years since quitting: 23.9  . Smokeless tobacco: Never Used  Substance Use Topics  . Alcohol use: Yes    Alcohol/week: 2.4 oz    Types: 4 Standard drinks or equivalent per week    Comment: rare  . Drug use: No  Allergies   Patient has no known allergies.   Review of Systems Review of Systems  Constitutional: Negative for activity change, chills and fever.  HENT: Negative for congestion.   Eyes: Negative for visual disturbance.  Respiratory: Positive for cough, chest tightness, shortness of breath and wheezing. Negative for apnea, choking and stridor.   Cardiovascular: Negative for chest pain and leg swelling.  Gastrointestinal: Negative for abdominal pain, diarrhea, nausea and vomiting.  Genitourinary: Negative for flank pain.  Musculoskeletal: Negative for back pain and neck pain.  Skin: Negative for rash.  Allergic/Immunologic: Negative for immunocompromised state.  Neurological: Negative for weakness and headaches.  Psychiatric/Behavioral: Negative for confusion.   Physical Exam Updated Vital Signs BP 130/72   Pulse 72   Temp 97.6 F (36.4 C) (Oral)   Resp 16   SpO2 95%   Physical Exam  Constitutional: He is oriented to person, place, and time. He appears well-developed.  Non-toxic appearance. He does not appear ill. Nasal cannula in place.  HENT:  Head: Normocephalic.  Right Ear: Tympanic membrane normal.    Left Ear: Tympanic membrane normal.  Nose: Nose normal. Right sinus exhibits no maxillary sinus tenderness and no frontal sinus tenderness. Left sinus exhibits no maxillary sinus tenderness and no frontal sinus tenderness.  Mouth/Throat: Uvula is midline and oropharynx is clear and moist.  Eyes: Conjunctivae are normal.  Neck: Normal range of motion. Neck supple.  Cardiovascular: Normal rate, regular rhythm, normal heart sounds and intact distal pulses. Exam reveals no gallop and no friction rub.  No murmur heard. Pulmonary/Chest: Effort normal. No stridor. No respiratory distress. He has no decreased breath sounds. He has wheezes in the right upper field, the right middle field, the right lower field, the left upper field, the left middle field and the left lower field. He has no rhonchi. He has no rales. He exhibits no tenderness.  Abdominal: Soft. Bowel sounds are normal. He exhibits no distension and no mass. There is no tenderness. There is no guarding.  Musculoskeletal: Normal range of motion. He exhibits no edema, tenderness or deformity.       Right lower leg: Normal. He exhibits no tenderness and no edema.       Left lower leg: Normal. He exhibits no tenderness and no edema.  Lymphadenopathy:    He has no cervical adenopathy.  Neurological: He is alert and oriented to person, place, and time.  Skin: Skin is warm and dry.  Psychiatric: His behavior is normal.  Nursing note and vitals reviewed.  ED Treatments / Results  Labs (all labs ordered are listed, but only abnormal results are displayed) Labs Reviewed  COMPREHENSIVE METABOLIC PANEL - Abnormal; Notable for the following components:      Result Value   BUN 22 (*)    Creatinine, Ser 1.26 (*)    ALT 14 (*)    GFR calc non Af Amer 54 (*)    All other components within normal limits  BRAIN NATRIURETIC PEPTIDE - Abnormal; Notable for the following components:   B Natriuretic Peptide 116.0 (*)    All other components within  normal limits  CBC  I-STAT TROPONIN, ED    EKG  EKG Interpretation  Date/Time:  Monday December 25 2016 09:12:53 EST Ventricular Rate:  90 PR Interval:    QRS Duration: 82 QT Interval:  347 QTC Calculation: 425 R Axis:   69 Text Interpretation:  Sinus rhythm Right atrial enlargement Artifact in lead(s) III V1 V4 V5 V6 No significant change  since last tracing Confirmed by Gareth Morgan [phone]) on 12/25/2016 2:06:05 PM       Radiology Dg Chest 2 View  Result Date: 12/25/2016 CLINICAL DATA:  Worsening of chronic shortness of breath. History of COPD. EXAM: CHEST  2 VIEW COMPARISON:  PA and lateral chest 11/15/2016 and 09/30/2015. FINDINGS: The chest is hyperexpanded with attenuation of the pulmonary vasculature. Lungs are clear. No pneumothorax or pleural effusion. Heart size is normal. Aortic atherosclerosis noted. No focal bony abnormality. IMPRESSION: No acute disease. Emphysema. Atherosclerosis. Electronically Signed   By: Inge Rise M.D.   On: 12/25/2016 09:56    Procedures Procedures (including critical care time)  Medications Ordered in ED Medications  morphine 4 MG/ML injection 4 mg (4 mg Intravenous Refused 12/25/16 1430)  albuterol (PROVENTIL) (2.5 MG/3ML) 0.083% nebulizer solution 5 mg (5 mg Nebulization Given 12/25/16 1003)  aerochamber Z-Stat Plus/medium 1 each (1 each Other Provided for home use 12/25/16 1221)  albuterol (PROVENTIL,VENTOLIN) solution continuous neb (10 mg/hr Nebulization Given 12/25/16 1205)  ipratropium (ATROVENT) nebulizer solution 0.5 mg (0.5 mg Nebulization Given 12/25/16 1205)  methylPREDNISolone sodium succinate (SOLU-MEDROL) 125 mg/2 mL injection 125 mg (125 mg Intravenous Given 12/25/16 1549)     Initial Impression / Assessment and Plan / ED Course  I have reviewed the triage vital signs and the nursing notes.  Pertinent labs & imaging results that were available during my care of the patient were reviewed by me and considered in  my medical decision making (see chart for details).     76 year old male with a h/o of COPD, CKD, HTN, and HLD with dyspnea, wheezing, chest tightness, and cough, worsening over 2 weeks and increased albuterol use over the last 24 hours. On arrival, he placed on 2L of O2 for SaO2 of 87% on RA. Duoneb given and SaO2 returned to 87% while changing positions. Continuous nebulizer given and SaO2 returned to 89%. The patient was placed back on 1L of O2 with SaO2 of 92%, improving to 93-94% on 2L. CXR with emphysema, but otherwise unremarkable. BNP 116. Troponin negative. CBC is unremarkable. Cr 1.26, which appears much improved from the patient's baseline of 1.4-1.5. 125 mg solumedrol given. The patient was discussed with Dr. Billy Fischer, attending physician. Consulted the hospitalist and Dr. Shanon Brow will admit the patient for COPD exacerbation. The patient appears reasonably stabilized for admission considering the current resources, flow, and capabilities available in the ED at this time, and I doubt any other [facility] requiring further screening and/or treatment in the ED prior to admission.  Final Clinical Impressions(s) / ED Diagnoses   Final diagnoses:  COPD exacerbation [facility])    ED Discharge Orders    None       Joanne Gavel, PA-C 12/25/16 1652    Gareth Morgan, MD 12/28/16 1317

## 2016-12-25 NOTE — ED Notes (Signed)
Patient finished with continuous neb treatment. Will monitor patient on room air, then walk patient with O2 monitor.

## 2016-12-26 DIAGNOSIS — J441 Chronic obstructive pulmonary disease with (acute) exacerbation: Secondary | ICD-10-CM | POA: Diagnosis not present

## 2016-12-26 LAB — BASIC METABOLIC PANEL
Anion gap: 8 (ref 5–15)
BUN: 27 mg/dL — AB (ref 6–20)
CHLORIDE: 102 mmol/L (ref 101–111)
CO2: 28 mmol/L (ref 22–32)
Calcium: 9.2 mg/dL (ref 8.9–10.3)
Creatinine, Ser: 1.21 mg/dL (ref 0.61–1.24)
GFR calc Af Amer: >60 mL/min (ref >60)
GFR calc non Af Amer: 57 mL/min — ABNORMAL LOW (ref >60)
GLUCOSE: 131 mg/dL — AB (ref 65–99)
POTASSIUM: 4.4 mmol/L (ref 3.5–5.1)
Sodium: 138 mmol/L (ref 135–145)

## 2016-12-26 LAB — CBC
HEMATOCRIT: 41.6 % (ref 39.0–52.0)
Hemoglobin: 14.3 g/dL (ref 13.0–17.0)
MCH: 30.6 pg (ref 26.0–34.0)
MCHC: 34.4 g/dL (ref 30.0–36.0)
MCV: 89.1 fL (ref 78.0–100.0)
Platelets: 161 10*3/uL (ref 150–400)
RBC: 4.67 MIL/uL (ref 4.22–5.81)
RDW: 14.4 % (ref 11.5–15.5)
WBC: 7.6 10*3/uL (ref 4.0–10.5)

## 2016-12-26 MED ORDER — AZITHROMYCIN 250 MG PO TABS: ORAL_TABLET | ORAL | 0 refills | Status: DC

## 2016-12-26 MED ORDER — GUAIFENESIN ER 600 MG PO TB12: 600.0000 mg | ORAL_TABLET | Freq: Two times a day (BID) | ORAL | 0 refills | Status: DC

## 2016-12-26 MED ORDER — PREDNISONE 20 MG PO TABS: 40.0000 mg | ORAL_TABLET | Freq: Every day | ORAL | 0 refills | Status: AC

## 2016-12-26 NOTE — Discharge Summary (Signed)
Physician Discharge Summary  Justin Walters [id number]:[id number] DOB: 1940/07/05 DOA: 12/25/2016  PCP: Biagio Borg, MD  Admit date: 12/25/2016 Discharge date: 12/26/2016  Admitted From: Home Disposition:  Home  Home Health:No Equipment/Devices: None  Discharge Condition: Stable CODE STATUS: Full Diet recommendation: Heart Healthy  Brief/Interim Summary:  Patient is a 76 year old male with past medical history of COPD who presents to the emergency department with complaints of several days of wheezing and shortness of breath.  Patient  did not report any fever at home but was having bothering cough.  Denies any chest pain on presentation.  When he presented ,his saturation on room air was  in upper 80s .  Patient was started on supplemental oxygen and nebulization. This morning during my evaluation patient is already feeling better.  He had mild expiratory bilateral wheezes.  Patient ambulated in the hallway and he saturated 92-94% without oxygen. Patient will be discharged today to home with prednisone and azithromycin.  He will follow-up with his primary care physician in a week. Patient does not have any history of recent pulmonary function test done.  He is only on albuterol at home.  We will recommend him to follow-up with pulmonology as an outpatient.  Other issues addressed:  Hyperlipidemia: On medication at home.  We will resume it on discharge.  Hypertension: Currently blood pressure stable.  Will resume his home medications on discharge.  CKD: Currently kidney function is stable.    Discharge Diagnoses:  Principal Problem:   COPD exacerbation ([place]) Active Problems:   Hyperlipidemia   Essential hypertension   CKD (chronic kidney disease)    Discharge Instructions  Discharge Instructions    Diet - low sodium heart healthy   Complete by:  As directed    Discharge instructions   Complete by:  As directed    1) Take prescribed medications as instructed. 2) Follow up  with your PCP in a week. 3) Follow up with Pulmonology in 2 weeks. Name and number of the provider has been attached.   Increase activity slowly   Complete by:  As directed      Allergies as of 12/26/2016   No Known Allergies     Medication List    STOP taking these medications   benzonatate 100 MG capsule Commonly known as:  TESSALON PERLES   sildenafil 100 MG tablet Commonly known as:  VIAGRA     TAKE these medications   albuterol 108 (90 Base) MCG/ACT inhaler Commonly known as:  PROVENTIL HFA;VENTOLIN HFA Inhale 2 puffs into the lungs every 6 (six) hours as needed for wheezing or shortness of breath.   amLODipine 5 MG tablet Commonly known as:  NORVASC Take 1 tablet (5 mg total) by mouth daily.   aspirin 81 MG EC tablet Take 81 mg by mouth daily. Swallow whole.   azithromycin 250 MG tablet Commonly known as:  ZITHROMAX Take daily for three days from tomorrow Start taking on:  12/27/2016   budesonide-formoterol 160-4.5 MCG/ACT inhaler Commonly known as:  SYMBICORT Inhale 2 puffs into the lungs 2 (two) times daily.   carvedilol 12.5 MG tablet Commonly known as:  COREG TAKE 1 TABLET TWICE DAILY WITH MEALS   gabapentin 300 MG capsule Commonly known as:  NEURONTIN TAKE 1 CAPSULE THREE TIMES DAILY   guaiFENesin 600 MG 12 hr tablet Commonly known as:  MUCINEX Take 1 tablet (600 mg total) by mouth 2 (two) times daily.   predniSONE 20 MG tablet Commonly known as:  DELTASONE  Take 2 tablets (40 mg total) by mouth daily for 4 days. What changed:    medication strength  how much to take  how to take this  when to take this  additional instructions   rosuvastatin 20 MG tablet Commonly known as:  CRESTOR TAKE 1 TABLET EVERY DAY   terazosin 10 MG capsule Commonly known as:  HYTRIN TAKE 1 CAPSULE AT BEDTIME   tiotropium 18 MCG inhalation capsule Commonly known as:  SPIRIVA HANDIHALER Place 1 capsule (18 mcg total) into inhaler and inhale daily.    ZZZQUIL PO Take by mouth.      Follow-up Information    Biagio Borg, MD. Schedule an appointment as soon as possible for a visit in 1 week(s).   Specialties:  Internal Medicine, Radiology Contact information: [place] [address] [phone]        Rush Farmer, MD. Schedule an appointment as soon as possible for a visit in 2 week(s).   Specialty:  Pulmonary Disease Contact information: [address] [place] [address] [phone]          No Known Allergies  Consultations:  None   Procedures/Studies: Dg Chest 2 View  Result Date: 12/25/2016 CLINICAL DATA:  Worsening of chronic shortness of breath. History of COPD. EXAM: CHEST  2 VIEW COMPARISON:  PA and lateral chest 11/15/2016 and 09/30/2015. FINDINGS: The chest is hyperexpanded with attenuation of the pulmonary vasculature. Lungs are clear. No pneumothorax or pleural effusion. Heart size is normal. Aortic atherosclerosis noted. No focal bony abnormality. IMPRESSION: No acute disease. Emphysema. Atherosclerosis. Electronically Signed   By: Inge Rise M.D.   On: 12/25/2016 09:56       Subjective: Patient seen and examined the bedside this morning.  Feels better and wants to go home.  Discharge Exam: Vitals:   12/26/16 1308 12/26/16 1430  BP: 112/74   Pulse: 80   Resp: 18   Temp: 98.1 F (36.7 C)   SpO2: 94% 93%   Vitals:   12/26/16 0742 12/26/16 1234 12/26/16 1308 12/26/16 1430  BP:   112/74   Pulse:   80   Resp:   18   Temp:   98.1 F (36.7 C)   TempSrc:   Oral   SpO2: 92% 92% 94% 93%  Weight:      Height:        General: Pt is alert, awake, not in acute distress Cardiovascular: RRR, S1/S2 +, no rubs, no gallops Respiratory: Bilateral mild expiratory wheezes  abdominal: Soft, NT, ND, bowel sounds + Extremities: no edema, no cyanosis    The results of significant diagnostics from this hospitalization (including imaging, microbiology, ancillary  and laboratory) are listed below for reference.     Microbiology: No results found for this or any previous visit (from the past 240 hour(s)).   Labs: BNP (last 3 results) Recent Labs    12/25/16 1425  BNP 330.0*   Basic Metabolic Panel: Recent Labs  Lab 12/25/16 1425 12/26/16 0348  NA 141 138  K 4.0 4.4  CL 103 102  CO2 30 28  GLUCOSE 99 131*  BUN 22* 27*  CREATININE 1.26* 1.21  CALCIUM 9.0 9.2   Liver Function Tests: Recent Labs  Lab 12/25/16 1425  AST 21  ALT 14*  ALKPHOS 53  BILITOT 1.0  PROT 7.6  ALBUMIN 4.2   No results for input(s): LIPASE, AMYLASE in the last 168 hours. No results for input(s): AMMONIA in  the last 168 hours. CBC: Recent Labs  Lab 12/25/16 1425 12/26/16 0348  WBC 7.2 7.6  HGB 14.2 14.3  HCT 43.6 41.6  MCV 89.5 89.1  PLT 159 161   Cardiac Enzymes: No results for input(s): CKTOTAL, CKMB, CKMBINDEX, TROPONINI in the last 168 hours. BNP: Invalid input(s): POCBNP CBG: No results for input(s): GLUCAP in the last 168 hours. D-Dimer No results for input(s): DDIMER in the last 72 hours. Hgb A1c No results for input(s): HGBA1C in the last 72 hours. Lipid Profile No results for input(s): CHOL, HDL, LDLCALC, TRIG, CHOLHDL, LDLDIRECT in the last 72 hours. Thyroid function studies No results for input(s): TSH, T4TOTAL, T3FREE, THYROIDAB in the last 72 hours.  Invalid input(s): FREET3 Anemia work up No results for input(s): VITAMINB12, FOLATE, FERRITIN, TIBC, IRON, RETICCTPCT in the last 72 hours. Urinalysis    Component Value Date/Time   COLORURINE YELLOW 08/15/2016 1606   APPEARANCEUR Cloudy (A) 08/15/2016 1606   LABSPEC 1.020 08/15/2016 1606   PHURINE 6.5 08/15/2016 1606   GLUCOSEU NEGATIVE 08/15/2016 1606   HGBUR MODERATE (A) 08/15/2016 1606   HGBUR moderate 07/26/2007 0924   BILIRUBINUR NEGATIVE 08/15/2016 1606   KETONESUR NEGATIVE 08/15/2016 1606   PROTEINUR 30 (A) 08/04/2013 0620   UROBILINOGEN 0.2 08/15/2016 1606    NITRITE NEGATIVE 08/15/2016 1606   LEUKOCYTESUR LARGE (A) 08/15/2016 1606   Sepsis Labs Invalid input(s): PROCALCITONIN,  WBC,  LACTICIDVEN Microbiology No results found for this or any previous visit (from the past 240 hour(s)).   Time coordinating discharge: Over 30 minutes  SIGNED:   Marene Lenz, MD  [facility] 12/26/2016, 2:45 PM Pager [phone]  If 7PM-7AM, please contact night-coverage [email or web address] Password [email or web address]

## 2016-12-26 NOTE — Progress Notes (Signed)
Patient ambulated in hall with out oxygen, O2 saturation was 92-94%. Will continue to monitor.

## 2016-12-26 NOTE — Progress Notes (Signed)
Patient discharged to home, discharge instructions reviewed with patient who verbalized understanding. New RX's given to patient. 

## 2016-12-27 ENCOUNTER — Telehealth: Payer: Self-pay | Admitting: *Deleted

## 2016-12-27 NOTE — Telephone Encounter (Signed)
Transition Care Management Follow-up Telephone Call   Date discharged? 12/26/16   How have you been since you were released from the hospital? Pt states he seem to be doing ok   Do you understand why you were in the hospital? YES   Do you understand the discharge instructions? YES   Where were you discharged to? Home   Items Reviewed:  Medications reviewed: YES  Allergies reviewed: YES  Dietary changes reviewed: YES  Referrals reviewed: No referral needed   Functional Questionnaire:   Activities of Daily Living (ADLs):   He states he are independent in the following: ambulation, bathing and hygiene, feeding, continence, grooming, toileting and dressing States he doesn't require assistance    Any transportation issues/concerns?: NO   Any patient concerns? NO   Confirmed importance and date/time of follow-up visits scheduled YES, appt 01/04/17  Provider Appointment booked with Dr, Jenny Reichmann  Confirmed with patient if condition begins to worsen call PCP or go to the ER.  Patient was given the office number and encouraged to call back with question or concerns.  : YES

## 2017-01-03 NOTE — Progress Notes (Addendum)
Subjective:   Justin Walters is a 77 y.o. male who presents for Medicare Annual/Subsequent preventive examination.  Review of Systems:  No ROS.  Medicare Wellness Visit. Additional risk factors are reflected in the social history.  Cardiac Risk Factors include: advanced age (>20men, >36 women);dyslipidemia;hypertension;male gender Sleep patterns: feels rested on waking, gets up 1-2 times nightly to void and sleeps 6-7 hours nightly.    Home Safety/Smoke Alarms: Feels safe in home. Smoke alarms in place.  Living environment; residence and Firearm Safety: 1-story house/ trailer, no firearms Lives with daughter, no needs for DME, good support system. Seat Belt Safety/Bike Helmet: Wears seat belt.     Objective:    Vitals: BP 112/68   Pulse 76   Resp (!) 22   Ht 5\' 7"  (1.702 m)   Wt 148 lb (67.1 kg)   SpO2 96%   BMI 23.18 kg/m   Body mass index is 23.18 kg/m.  Advanced Directives 01/04/2017 12/25/2016 10/23/2016 04/02/2016 12/30/2015 09/04/2014 09/04/2014  Does Patient Have a Medical Advance Directive? No No No No No No No  Would patient like information on creating a medical advance directive? No - Patient declined No - Patient declined No - Patient declined - No - Patient declined No - patient declined information -    Tobacco Social History   Tobacco Use  Smoking Status Former Smoker  . Packs/day: 3.00  . Years: 34.00  . Pack years: 102.00  . Types: Cigarettes  . Last attempt to quit: 01/02/1993  . Years since quitting: 24.0  Smokeless Tobacco Never Used     Counseling given: Not Answered   Past Medical History:  Diagnosis Date  . At risk for sleep apnea    STOP-BANG= 5    SENT TO PCP 06-13-2013  . Bladder stone   . BPH (benign prostatic hyperplasia)   . COPD (chronic obstructive pulmonary disease) ([place])   . Diverticulosis of colon   . Dysuria   . Glucose intolerance (impaired glucose tolerance)   . History of kidney stones   . Hyperlipidemia   . Hypertension   .  Ureteral disorder 2018   ureteral repair   Past Surgical History:  Procedure Laterality Date  . APPENDECTOMY  1980's  . CATARACT EXTRACTION W/ INTRAOCULAR LENS  IMPLANT, BILATERAL    . COLONOSCOPY  10-05-2003   tics only   . CYSTOSCOPY N/A 06/23/2013   Procedure: CYSTOSCOPY WITH LITHOLAPAXY, BLADDER EXPLORATION, REPAIR BLADDER PERFORATION;  Surgeon: Arvil Persons, MD;  Location: [facility];  Service: Urology;  Laterality: N/A;  . EXTRACORPOREAL SHOCK WAVE LITHOTRIPSY Left 10/23/2016   Procedure: LEFT EXTRACORPOREAL SHOCK WAVE LITHOTRIPSY (ESWL);  Surgeon: Nickie Retort, MD;  Location: [facility];  Service: Urology;  Laterality: Left;  . HOLMIUM LASER APPLICATION N/A 2/99/3716   Procedure: HOLMIUM LASER APPLICATION;  Surgeon: Arvil Persons, MD;  Location: [facility];  Service: Urology;  Laterality: N/A;  . LAPAROSCOPIC INGUINAL HERNIA REPAIR Bilateral 10-09-2003  . TRANSURETHRAL RESECTION OF PROSTATE N/A 06/23/2013   Procedure: TRANSURETHRAL RESECTION OF THE PROSTATE;  Surgeon: Arvil Persons, MD;  Location[facility];  Service: Urology;  Laterality: N/A;   Family History  Problem Relation Age of Onset  . COPD Mother   . Cancer Father        unknown  . Emphysema Brother   . Heart failure Brother   . Colon cancer Neg Hx   . Esophageal cancer Neg Hx   .  Rectal cancer Neg Hx   . Stomach cancer Neg Hx    Social History   Socioeconomic History  . Marital status: Widowed    Spouse name: None  . Number of children: 3  . Years of education: None  . Highest education level: None  Social Needs  . Financial resource strain: Not hard at all  . Food insecurity - worry: Never true  . Food insecurity - inability: Never true  . Transportation needs - medical: No  . Transportation needs - non-medical: No  Occupational History  . Occupation: Retired Chemical engineer)  Tobacco Use  . Smoking status: Former Smoker    Packs/day: 3.00     Years: 34.00    Pack years: 102.00    Types: Cigarettes    Last attempt to quit: 01/02/1993    Years since quitting: 24.0  . Smokeless tobacco: Never Used  Substance and Sexual Activity  . Alcohol use: Yes    Alcohol/week: 2.4 oz    Types: 4 Standard drinks or equivalent per week    Comment: rare  . Drug use: No  . Sexual activity: Not Currently    Partners: Female  Other Topics Concern  . None  Social History Narrative  . None    Outpatient Encounter Medications as of 01/04/2017  Medication Sig  . albuterol (PROVENTIL HFA;VENTOLIN HFA) 108 (90 Base) MCG/ACT inhaler Inhale 2 puffs into the lungs every 6 (six) hours as needed for wheezing or shortness of breath.  Marland Kitchen aspirin 81 MG EC tablet Take 81 mg by mouth daily. Swallow whole.  . carvedilol (COREG) 12.5 MG tablet TAKE 1 TABLET TWICE DAILY WITH MEALS  . DiphenhydrAMINE HCl, Sleep, (ZZZQUIL PO) Take by mouth.  . gabapentin (NEURONTIN) 300 MG capsule TAKE 1 CAPSULE THREE TIMES DAILY  . guaiFENesin (MUCINEX) 600 MG 12 hr tablet Take 1 tablet (600 mg total) by mouth 2 (two) times daily.  . rosuvastatin (CRESTOR) 20 MG tablet TAKE 1 TABLET EVERY DAY  . terazosin (HYTRIN) 10 MG capsule TAKE 1 CAPSULE AT BEDTIME  . amLODipine (NORVASC) 5 MG tablet Take 1 tablet (5 mg total) by mouth daily.  . [DISCONTINUED] azithromycin (ZITHROMAX) 250 MG tablet Take daily for three days from tomorrow (Patient not taking: Reported on 01/04/2017)  . [DISCONTINUED] budesonide-formoterol (SYMBICORT) 160-4.5 MCG/ACT inhaler Inhale 2 puffs into the lungs 2 (two) times daily. (Patient not taking: Reported on 12/25/2016)  . [DISCONTINUED] tiotropium (SPIRIVA HANDIHALER) 18 MCG inhalation capsule Place 1 capsule (18 mcg total) into inhaler and inhale daily. (Patient not taking: Reported on 12/25/2016)   No facility-administered encounter medications on file as of 01/04/2017.     Activities of Daily Living In your present state of health, do you have any difficulty  performing the following activities: 01/04/2017 12/25/2016  Hearing? N N  Vision? N N  Difficulty concentrating or making decisions? N N  Walking or climbing stairs? N N  Dressing or bathing? N N  Doing errands, shopping? N N  Preparing Food and eating ? N -  Using the Toilet? N -  In the past six months, have you accidently leaked urine? N -  Do you have problems with loss of bowel control? N -  Managing your Medications? N -  Managing your Finances? N -  Housekeeping or managing your Housekeeping? N -  Some recent data might be hidden    Patient Care Team: Biagio Borg, MD as PCP - Cephus Richer, MD as Consulting  Physician (Urology) Odis Luster Huey Bienenstock, MD as Referring Physician (Urology)   Assessment:   This is a routine wellness examination for Justin Walters. Physical assessment deferred to PCP.   Exercise Activities and Dietary recommendations Current Exercise Habits: The patient does not participate in regular exercise at present, Exercise limited by: respiratory conditions(s)  Diet (meal preparation, eat out, water intake, caffeinated beverages, dairy products, fruits and vegetables): in general, a "healthy" diet  , well balanced   Reviewed heart healthy diet, encouraged patient to increase daily water intake.       Goals    . Patient Stated     Maintain current health status. Stay as healthy and as independent as possible       Fall Risk Fall Risk  01/04/2017 07/19/2016 12/30/2015 05/18/2015 05/18/2015  Falls in the past year? No No No No No  Risk for fall due to : - - - - -  Risk for fall due to: Comment - - - - -    Depression Screen PHQ 2/9 Scores 01/04/2017 12/30/2015 05/18/2015 05/15/2014  PHQ - 2 Score 0 0 4 1  PHQ- 9 Score 2 - 12 -    Cognitive Function MMSE - Mini Mental State Exam 01/04/2017  Orientation to time 5  Orientation to Place 5  Registration 3  Attention/ Calculation 5  Recall 1  Language- name 2 objects 2  Language- repeat 1    Language- follow 3 step command 3  Language- read & follow direction 1  Write a sentence 1  Copy design 1  Total score 28        Immunization History  Administered Date(s) Administered  . Influenza Whole 11/30/2006, 10/03/2007, 12/11/2008, 10/13/2009  . Influenza, High Dose Seasonal PF 12/30/2015, 11/15/2016  . Influenza,inj,Quad PF,6+ Mos 11/17/2014  . Pneumococcal Conjugate-13 05/01/2013  . Pneumococcal Polysaccharide-23 10/03/2007  . Td 10/03/2007  . Zoster 10/03/2007    Screening Tests Health Maintenance  Topic Date Due  . TETANUS/TDAP  10/02/2017  . COLONOSCOPY  12/04/2020  . INFLUENZA VACCINE  Completed  . PNA vac Low Risk Adult  Completed      Plan:    Continue doing brain stimulating activities (puzzles, reading, adult coloring books, staying active) to keep memory sharp.   Continue to eat heart healthy diet (full of fruits, vegetables, whole grains, lean protein, water--limit salt, fat, and sugar intake) and increase physical activity as tolerated.  I have personally reviewed and noted the following in the patient's chart:   . Medical and social history . Use of alcohol, tobacco or illicit drugs  . Current medications and supplements . Functional ability and status . Nutritional status . Physical activity . Advanced directives . List of other physicians . Vitals . Screenings to include cognitive, depression, and falls . Referrals and appointments  In addition, I have reviewed and discussed with patient certain preventive protocols, quality metrics, and best practice recommendations. A written personalized care plan for preventive services as well as general preventive health recommendations were provided to patient.     Michiel Cowboy, RN  01/04/2017  Medical screening examination/treatment/procedure(s) were performed by non-physician practitioner and as supervising physician I was immediately available for consultation/collaboration. I agree with above.  Cathlean Cower, MD

## 2017-01-04 ENCOUNTER — Ambulatory Visit (INDEPENDENT_AMBULATORY_CARE_PROVIDER_SITE_OTHER): Payer: Medicare HMO | Admitting: *Deleted

## 2017-01-04 ENCOUNTER — Other Ambulatory Visit: Payer: Self-pay | Admitting: Urology

## 2017-01-04 ENCOUNTER — Ambulatory Visit (INDEPENDENT_AMBULATORY_CARE_PROVIDER_SITE_OTHER): Payer: Medicare HMO | Admitting: Internal Medicine

## 2017-01-04 VITALS — BP 112/68 | HR 76 | Temp 97.6°F | Ht 67.0 in | Wt 148.0 lb

## 2017-01-04 VITALS — BP 112/68 | HR 76 | Temp 97.6°F | Resp 22 | Ht 67.0 in | Wt 148.0 lb

## 2017-01-04 DIAGNOSIS — E785 Hyperlipidemia, unspecified: Secondary | ICD-10-CM

## 2017-01-04 DIAGNOSIS — I10 Essential (primary) hypertension: Secondary | ICD-10-CM

## 2017-01-04 DIAGNOSIS — Z0000 Encounter for general adult medical examination without abnormal findings: Secondary | ICD-10-CM

## 2017-01-04 DIAGNOSIS — R7302 Impaired glucose tolerance (oral): Secondary | ICD-10-CM

## 2017-01-04 DIAGNOSIS — J441 Chronic obstructive pulmonary disease with (acute) exacerbation: Secondary | ICD-10-CM | POA: Diagnosis not present

## 2017-01-04 MED ORDER — BUDESONIDE-FORMOTEROL FUMARATE 80-4.5 MCG/ACT IN AERO: 2.0000 | INHALATION_SPRAY | Freq: Two times a day (BID) | RESPIRATORY_TRACT | 12 refills | Status: DC

## 2017-01-04 MED ORDER — LEVOFLOXACIN 250 MG PO TABS: 250.0000 mg | ORAL_TABLET | Freq: Every day | ORAL | 0 refills | Status: AC

## 2017-01-04 MED ORDER — METHYLPREDNISOLONE ACETATE 80 MG/ML IJ SUSP
80.0000 mg | Freq: Once | INTRAMUSCULAR | Status: AC
  Administered 2017-01-04: 80 mg via INTRAMUSCULAR

## 2017-01-04 MED ORDER — HYDROCODONE-HOMATROPINE 5-1.5 MG/5ML PO SYRP: 5.0000 mL | ORAL_SOLUTION | Freq: Four times a day (QID) | ORAL | 0 refills | Status: AC | PRN

## 2017-01-04 MED ORDER — PREDNISONE 10 MG PO TABS
ORAL_TABLET | ORAL | 0 refills | Status: DC
Start: 2017-01-04 — End: 2017-01-18

## 2017-01-04 NOTE — Assessment & Plan Note (Addendum)
mod, c/w bronchitis vs pna, for antibx course, cough med prn,  to f/u any worsening symptoms or concerns; add symbicort for better controller tx

## 2017-01-04 NOTE — Assessment & Plan Note (Signed)
Lab Results  Component Value Date   HGBA1C 6.1 04/21/2015  stable overall by history and exam, recent data reviewed with pt, and pt to continue medical treatment as before,  to f/u any worsening symptoms or concerns

## 2017-01-04 NOTE — Assessment & Plan Note (Signed)
stable overall by history and exam, recent data reviewed with pt, and pt to continue medical treatment as before,  to f/u any worsening symptoms or concerns BP Readings from Last 3 Encounters:  01/04/17 112/68  01/04/17 112/68  12/26/16 112/74

## 2017-01-04 NOTE — Progress Notes (Signed)
Subjective:    Patient ID: Justin Walters, male    DOB: [birth year], 77 y.o.   MRN: [id number]  HPI   Here with acute onset mild to mod 2-3 days ST, HA, general weakness and malaise, with prod cough greenish sputum, but Pt denies chest pain, increased sob or doe, wheezing, orthopnea, PND, increased LE swelling, palpitations, dizziness or syncope, except for onset mild wheezing and dyspnea in last day. Is s/p recent ED visit for copd exac (and prior to that renal stone now for what sounds like lithotripsy.   Pt denies polydipsia, polyuria Past Medical History:  Diagnosis Date  . At risk for sleep apnea    STOP-BANG= 5    SENT TO PCP 06-13-2013  . Bladder stone   . BPH (benign prostatic hyperplasia)   . COPD (chronic obstructive pulmonary disease) ([place])   . Diverticulosis of colon   . Dysuria   . Glucose intolerance (impaired glucose tolerance)   . History of kidney stones   . Hyperlipidemia   . Hypertension   . Ureteral disorder 2018   ureteral repair   Past Surgical History:  Procedure Laterality Date  . APPENDECTOMY  1980's  . CATARACT EXTRACTION W/ INTRAOCULAR LENS  IMPLANT, BILATERAL    . COLONOSCOPY  10-05-2003   tics only   . CYSTOSCOPY N/A 06/23/2013   Procedure: CYSTOSCOPY WITH LITHOLAPAXY, BLADDER EXPLORATION, REPAIR BLADDER PERFORATION;  Surgeon: Arvil Persons, MD;  Location: [facility];  Service: Urology;  Laterality: N/A;  . EXTRACORPOREAL SHOCK WAVE LITHOTRIPSY Left 10/23/2016   Procedure: LEFT EXTRACORPOREAL SHOCK WAVE LITHOTRIPSY (ESWL);  Surgeon: Nickie Retort, MD;  Location: [facility];  Service: Urology;  Laterality: Left;  . HOLMIUM LASER APPLICATION N/A 04/10/8117   Procedure: HOLMIUM LASER APPLICATION;  Surgeon: Arvil Persons, MD;  Location: [facility];  Service: Urology;  Laterality: N/A;  . LAPAROSCOPIC INGUINAL HERNIA REPAIR Bilateral 10-09-2003  . TRANSURETHRAL RESECTION OF PROSTATE N/A 06/23/2013   Procedure: TRANSURETHRAL RESECTION  OF THE PROSTATE;  Surgeon: Arvil Persons, MD;  Location: [facility];  Service: Urology;  Laterality: N/A;    reports that he quit smoking about 24 years ago. His smoking use included cigarettes. He has a 102.00 pack-year smoking history. he has never used smokeless tobacco. He reports that he drinks about 2.4 oz of alcohol per week. He reports that he does not use drugs. family history includes COPD in his mother; Cancer in his father; Emphysema in his brother; Heart failure in his brother. No Known Allergies Current Outpatient Medications on File Prior to Visit  Medication Sig Dispense Refill  . albuterol (PROVENTIL HFA;VENTOLIN HFA) 108 (90 Base) MCG/ACT inhaler Inhale 2 puffs into the lungs every 6 (six) hours as needed for wheezing or shortness of breath. 3 Inhaler 3  . aspirin 81 MG EC tablet Take 81 mg by mouth daily. Swallow whole.    . carvedilol (COREG) 12.5 MG tablet TAKE 1 TABLET TWICE DAILY WITH MEALS 180 tablet 2  . DiphenhydrAMINE HCl, Sleep, (ZZZQUIL PO) Take by mouth.    . gabapentin (NEURONTIN) 300 MG capsule TAKE 1 CAPSULE THREE TIMES DAILY 270 capsule 3  . guaiFENesin (MUCINEX) 600 MG 12 hr tablet Take 1 tablet (600 mg total) by mouth 2 (two) times daily. 14 tablet 0  . rosuvastatin (CRESTOR) 20 MG tablet TAKE 1 TABLET EVERY DAY 90 tablet 2  . terazosin (HYTRIN) 10 MG capsule TAKE 1 CAPSULE AT BEDTIME 90 capsule 2  .  amLODipine (NORVASC) 5 MG tablet Take 1 tablet (5 mg total) by mouth daily. 90 tablet 3   No current facility-administered medications on file prior to visit.    Review of Systems  Constitutional: Negative for other unusual diaphoresis or sweats HENT: Negative for ear discharge or swelling Eyes: Negative for other worsening visual disturbances Respiratory: Negative for stridor or other swelling  Gastrointestinal: Negative for worsening distension or other blood Genitourinary: Negative for retention or other urinary change Musculoskeletal:  Negative for other MSK pain or swelling Skin: Negative for color change or other new lesions Neurological: Negative for worsening tremors and other numbness  Psychiatric/Behavioral: Negative for worsening agitation or other fatigue All other system neg per pt    Objective:   Physical Exam BP 112/68   Pulse 76   Temp 97.6 F (36.4 C) (Oral)   Ht 5\' 7"  (1.702 m)   Wt 148 lb (67.1 kg)   SpO2 96%   BMI 23.18 kg/m   VS noted, mild ill appaering Constitutional: Pt appears in NAD HENT: Head: NCAT.  Right Ear: External ear normal.  Left Ear: External ear normal.  Eyes: . Pupils are equal, round, and reactive to light. Conjunctivae and EOM are normal Nose: without d/c or deformity Neck: Neck supple. Gross normal ROM Cardiovascular: Normal rate and regular rhythm.   Pulmonary/Chest: Effort normal and breath sounds decreased without rales but with bilat wheezing.  Neurological: Pt is alert. At baseline orientation, motor grossly intact Skin: Skin is warm. No rashes, other new lesions, no LE edema Psychiatric: Pt behavior is normal without agitation  No other exam findings    Assessment & Plan:

## 2017-01-04 NOTE — Assessment & Plan Note (Signed)
Lab Results  Component Value Date   LDLCALC 61 07/19/2016  stable overall by history and exam, recent data reviewed with pt, and pt to continue medical treatment as before,  to f/u any worsening symptoms or concerns

## 2017-01-04 NOTE — Patient Instructions (Signed)
Continue doing brain stimulating activities (puzzles, reading, adult coloring books, staying active) to keep memory sharp.   Continue to eat heart healthy diet (full of fruits, vegetables, whole grains, lean protein, water--limit salt, fat, and sugar intake) and increase physical activity as tolerated.   Justin Walters , Thank you for taking time to come for your Medicare Wellness Visit. I appreciate your ongoing commitment to your health goals. Please review the following plan we discussed and let me know if I can assist you in the future.   These are the goals we discussed: Goals    . Patient Stated     Maintain current health status. Stay as healthy and as independent as possible       This is a list of the screening recommended for you and due dates:  Health Maintenance  Topic Date Due  . Tetanus Vaccine  10/02/2017  . Colon Cancer Screening  12/04/2020  . Flu Shot  Completed  . Pneumonia vaccines  Completed

## 2017-01-04 NOTE — Patient Instructions (Addendum)
You had the steroid shot today  Please take all new medication as prescribed - the antibiotic, cough medicine, prednisone and the Symbicort inhaler (to take twice per day, every day)  You can still use the Albuterol inhaler as needed for shortness of breath if needed  Please continue all other medications as before, and refills have been done if requested.  Please have the pharmacy call with any other refills you may need.  Please keep your appointments with your specialists as you may have planned  Please return in 6 months, or sooner if needed, with Lab testing done 3-5 days before

## 2017-01-05 ENCOUNTER — Other Ambulatory Visit: Payer: Self-pay

## 2017-01-05 ENCOUNTER — Encounter (HOSPITAL_COMMUNITY): Payer: Self-pay | Admitting: *Deleted

## 2017-01-07 NOTE — H&P (Signed)
Post-Operative Visit Report     12/20/2016   --------------------------------------------------------------------------------   Justin Walters  MRN: [id number]  PRIMARY CARE:  Cathlean Cower, MD  DOB: [birth year], 77 year old Male  REFERRING:    SSN: [id number]  PROVIDER:  [name], M.D.    LOCATION:  [facility] [phone]   --------------------------------------------------------------------------------   CC: I am here for a post operative visit.  HPI: [name] is a 77 year-old male established patient who is here for a post operative visit.  The surgery he had done was (L) ureteral ESWL. His surgery was done 10/23/2016.   He has not had post operative nausea. He has not had vomiting. He has not had post operative fever. He has not had post operative chills. He does have a good appetite. BOWEL HABITS: his bowels are moving normally.   Has passed large amount of "sand". KUB November 07, 2016 revealed part of the stone remained although it did appear fainter. He returns today and KUB again shows a smaller more faint stone that may have dropped a centimeter or 2 down the ureter -- the fragment is 6 mm. Ultrasound showed no hydronephrosis and it appears there may be some stone in the left lower pole which is new from the CT September 2018. He's been well without flank pain or gross hematuria. No dysuria. He is having some incontinence but recall Dr. Odis Luster repaired a urethral stricture in the last year. Patient's voiding with an adequate stream.     ALLERGIES: None   MEDICATIONS: Terazosin Hcl 10 mg capsule  Amlodipine Besylate 5 mg tablet  Amoxicillin-Clavulanate Potass 500 mg-125 mg tablet one po bid x 5 days and then one po qhs  Aspirin 81 MG TABS Oral  Carvedilol 12.5 mg tablet Oral  Gabapentin 300 mg capsule  Hydrocodone-Acetaminophen 5 mg-325 mg tablet 1 tablet PO Q 6 H PRN  Mucinex  Rosuvastatin Calcium 20 mg tablet Oral  Stool Softener  Symbicort 160  mcg-4.5 mcg/actuation hfa aerosol with adapter  Zzzquil     VITAL SIGNS:      12/20/2016 09:19 AM  BP 107/69 mmHg  Pulse 68 /min  Temperature 97.6 F / 36.4 C   MULTI-SYSTEM PHYSICAL EXAMINATION:    Constitutional: Well-nourished. No physical deformities. Normally developed. Good grooming.  Neck: Neck symmetrical, not swollen. Normal tracheal position.  Respiratory: No labored breathing, no use of accessory muscles.   Cardiovascular: Normal temperature, normal extremity pulses, no swelling, no varicosities.  Skin: No paleness, no jaundice, no cyanosis. No lesion, no ulcer, no rash.  Neurologic / Psychiatric: Oriented to time, oriented to place, oriented to person. No depression, no anxiety, no agitation.  Gastrointestinal: No mass, no tenderness, no rigidity, non obese abdomen.     PROCEDURES:         Renal Ultrasound (Limited) - 29518  Left Kidney: Length: 9.8 cm Depth: 3.5 cm Cortical Width: 0.7 cm Width: 3.9 cm    Left Kidney/Ureter:  ? cortical thinning noted. ---- Multiple echogenic foci with and without shadowing with the largest measuring 0.5 cm in the lower pole ( calcifications vs vessels).   Bladder:  PVR = not visualized.      I reviewed all the images and compared to today's KUB, prior KUB and CT images.         KUB - [id number]  A single view of the abdomen is obtained.  Calculi:  Possibly some left lower pole stones. He has a left 6 mm proximal  ureteral fragment remaining in between L4 and L5.       The bones appeared normal. The bowel gas pattern appeared normal. The soft tissues were unremarkable.         Urinalysis Dipstick Dipstick Cont'd  Color: Yellow Bilirubin: Neg  Appearance: Clear Ketones: Neg  Specific Gravity: 1.025 Blood: Neg  pH: 6.0 Protein: Neg  Glucose: Neg Urobilinogen: 0.2    Nitrites: Neg    Leukocyte Esterase: Neg    ASSESSMENT:      ICD-10 Details  1 GU:   Ureteral calculus - N20.1    PLAN:           Schedule Return  Visit/Planned Activity: Next Available Appointment - Schedule Surgery          Document Letter(s):  Created for Patient: Clinical Summary         Notes:   left ureteral stone - smaller and more faint 6 mm fragment remains. No hydro. Discussed nature r/b of continue stone passage, repeat ESWL or URS. Pt said ESWL was one of the easiest things he's ever done and he elects to proceed with repeat shockwave. He does not want any invasive procedures.   cc; Dr. Jenny Reichmann     * Signed by [name], M.D. on 12/20/16 at 4:45 PM (EST)*     The information contained in this medical record document is considered private and confidential patient information. This information can only be used for the medical diagnosis and/or medical services that are being provided by the patient's selected caregivers. This information can only be distributed outside of the patient's care if the patient agrees and signs waivers of authorization for this information to be sent to an outside source or route.   Post-Operative Visit Report     12/20/2016   --------------------------------------------------------------------------------   Justin Walters  MRN: [id number]  PRIMARY CARE:  Cathlean Cower, MD  DOB: 03-18-1940, 77 year old Male  REFERRING:    SSN: [id number]  PROVIDER:  [name], M.D.    LOCATION:  [facility] [phone]   --------------------------------------------------------------------------------   CC: I am here for a post operative visit.  HPI: Justin Walters is a 77 year-old male established patient who is here for a post operative visit.  The surgery he had done was (L) ureteral ESWL. His surgery was done 10/23/2016.   He has not had post operative nausea. He has not had vomiting. He has not had post operative fever. He has not had post operative chills. He does have a good appetite. BOWEL HABITS: his bowels are moving normally.   Has passed large amount of "sand". KUB November 07, 2016 revealed part of the stone remained although it did appear fainter. He returns today and KUB again shows a smaller more faint stone that may have dropped a centimeter or 2 down the ureter -- the fragment is 6 mm. Ultrasound showed no hydronephrosis and it appears there may be some stone in the left lower pole which is new from the CT September 2018. He's been well without flank pain or gross hematuria. No dysuria. He is having some incontinence but recall Dr. Odis Luster repaired a urethral stricture in the last year. Patient's voiding with an adequate stream.     ALLERGIES: None   MEDICATIONS: Terazosin Hcl 10 mg capsule  Amlodipine Besylate 5 mg tablet  Amoxicillin-Clavulanate Potass 500 mg-125 mg tablet one po bid x 5 days and then one po qhs  Aspirin 81 MG TABS Oral  Carvedilol 12.5 mg tablet Oral  Gabapentin 300 mg capsule  Hydrocodone-Acetaminophen 5 mg-325 mg tablet 1 tablet PO Q 6 H PRN  Mucinex  Rosuvastatin Calcium 20 mg tablet Oral  Stool Softener  Symbicort 160 mcg-4.5 mcg/actuation hfa aerosol with adapter  Zzzquil     VITAL SIGNS:      12/20/2016 09:19 AM  BP 107/69 mmHg  Pulse 68 /min  Temperature 97.6 F / 36.4 C   MULTI-SYSTEM PHYSICAL EXAMINATION:    Constitutional: Well-nourished. No physical deformities. Normally developed. Good grooming.  Neck: Neck symmetrical, not swollen. Normal tracheal position.  Respiratory: No labored breathing, no use of accessory muscles.   Cardiovascular: Normal temperature, normal extremity pulses, no swelling, no varicosities.  Skin: No paleness, no jaundice, no cyanosis. No lesion, no ulcer, no rash.  Neurologic / Psychiatric: Oriented to time, oriented to place, oriented to person. No depression, no anxiety, no agitation.  Gastrointestinal: No mass, no tenderness, no rigidity, non obese abdomen.     PROCEDURES:         Renal Ultrasound (Limited) - [id number]  Left Kidney: Length: 9.8 cm Depth: 3.5 cm Cortical Width: 0.7 cm Width:  3.9 cm    Left Kidney/Ureter:  ? cortical thinning noted. ---- Multiple echogenic foci with and without shadowing with the largest measuring 0.5 cm in the lower pole ( calcifications vs vessels).   Bladder:  PVR = not visualized.      I reviewed all the images and compared to today's KUB, prior KUB and CT images.         KUB - [id number]  A single view of the abdomen is obtained.  Calculi:  Possibly some left lower pole stones. He has a left 6 mm proximal ureteral fragment remaining in between L4 and L5.       The bones appeared normal. The bowel gas pattern appeared normal. The soft tissues were unremarkable.         Urinalysis Dipstick Dipstick Cont'd  Color: Yellow Bilirubin: Neg  Appearance: Clear Ketones: Neg  Specific Gravity: 1.025 Blood: Neg  pH: 6.0 Protein: Neg  Glucose: Neg Urobilinogen: 0.2    Nitrites: Neg    Leukocyte Esterase: Neg    ASSESSMENT:      ICD-10 Details  1 GU:   Ureteral calculus - N20.1    PLAN:           Schedule Return Visit/Planned Activity: Next Available Appointment - Schedule Surgery          Document Letter(s):  Created for Patient: Clinical Summary         Notes:   left ureteral stone - smaller and more faint 6 mm fragment remains. No hydro. Discussed nature r/b of continue stone passage, repeat ESWL or URS. Pt said ESWL was one of the easiest things he's ever done and he elects to proceed with repeat shockwave. He does not want any invasive procedures.   cc; Dr. Jenny Reichmann     * Signed by [name], M.D. on 12/20/16 at 4:45 PM (EST)*     The information contained in this medical record document is considered private and confidential patient information. This information can only be used for the medical diagnosis and/or medical services that are being provided by the patient's selected caregivers. This information can only be distributed outside of the patient's care if the patient agrees and signs waivers of authorization for this  information to be sent to an outside source or route.

## 2017-01-08 ENCOUNTER — Other Ambulatory Visit: Payer: Self-pay

## 2017-01-08 ENCOUNTER — Encounter (HOSPITAL_COMMUNITY)
Admission: RE | Disposition: A | Discharge status: Home / Self Care | Payer: Self-pay | Source: Ambulatory Visit | Attending: Urology

## 2017-01-08 ENCOUNTER — Ambulatory Visit (HOSPITAL_COMMUNITY): Payer: Medicare HMO

## 2017-01-08 ENCOUNTER — Ambulatory Visit (HOSPITAL_COMMUNITY)
Admission: RE | Admit: 2017-01-08 | Discharge: 2017-01-08 | Disposition: A | Discharge status: Home / Self Care | Payer: Medicare HMO | Source: Ambulatory Visit | Attending: Urology | Admitting: Urology

## 2017-01-08 ENCOUNTER — Encounter (HOSPITAL_COMMUNITY): Payer: Self-pay | Admitting: *Deleted

## 2017-01-08 DIAGNOSIS — R32 Unspecified urinary incontinence: Secondary | ICD-10-CM | POA: Insufficient documentation

## 2017-01-08 DIAGNOSIS — N201 Calculus of ureter: Secondary | ICD-10-CM | POA: Diagnosis not present

## 2017-01-08 DIAGNOSIS — Z79899 Other long term (current) drug therapy: Secondary | ICD-10-CM | POA: Insufficient documentation

## 2017-01-08 DIAGNOSIS — N202 Calculus of kidney with calculus of ureter: Secondary | ICD-10-CM | POA: Insufficient documentation

## 2017-01-08 DIAGNOSIS — Z7982 Long term (current) use of aspirin: Secondary | ICD-10-CM | POA: Diagnosis not present

## 2017-01-08 DIAGNOSIS — Z87442 Personal history of urinary calculi: Secondary | ICD-10-CM | POA: Insufficient documentation

## 2017-01-08 HISTORY — PX: EXTRACORPOREAL SHOCK WAVE LITHOTRIPSY: SHX1557

## 2017-01-08 SURGERY — LITHOTRIPSY, ESWL
Anesthesia: LOCAL | Laterality: Left

## 2017-01-08 MED ORDER — DIAZEPAM 5 MG PO TABS
10.0000 mg | ORAL_TABLET | ORAL | Status: AC
  Administered 2017-01-08: 10 mg via ORAL
  Filled 2017-01-08: qty 2

## 2017-01-08 MED ORDER — SODIUM CHLORIDE 0.9 % IV SOLN
INTRAVENOUS | Status: DC
  Administered 2017-01-08 (×2): via INTRAVENOUS

## 2017-01-08 MED ORDER — ASPIRIN 81 MG PO TBEC: 81.0000 mg | DELAYED_RELEASE_TABLET | Freq: Every day | ORAL | 12 refills | Status: DC

## 2017-01-08 MED ORDER — CIPROFLOXACIN HCL 500 MG PO TABS
500.0000 mg | ORAL_TABLET | ORAL | Status: AC
  Administered 2017-01-08: 500 mg via ORAL
  Filled 2017-01-08: qty 1

## 2017-01-08 MED ORDER — TRAMADOL HCL 50 MG PO TABS: 50.0000 mg | ORAL_TABLET | Freq: Four times a day (QID) | ORAL | 0 refills | Status: DC | PRN

## 2017-01-08 MED ORDER — DIPHENHYDRAMINE HCL 25 MG PO CAPS
25.0000 mg | ORAL_CAPSULE | ORAL | Status: AC
  Administered 2017-01-08: 25 mg via ORAL
  Filled 2017-01-08: qty 1

## 2017-01-08 NOTE — Discharge Instructions (Signed)
Lithotripsy, Care After °This sheet gives you information about how to care for yourself after your procedure. Your health care provider may also give you more specific instructions. If you have problems or questions, contact your health care provider. °What can I expect after the procedure? °After the procedure, it is common to have: °· Some blood in your urine. This should only last for a few days. °· Soreness in your back, sides, or upper abdomen for a few days. °· Blotches or bruises on your back where the pressure wave entered the skin. °· Pain, discomfort, or nausea when pieces (fragments) of the kidney stone move through the tube that carries urine from the kidney to the bladder (ureter). Stone fragments may pass soon after the procedure, but they may continue to pass for up to 4-8 weeks. °? If you have severe pain or nausea, contact your health care provider. This may be caused by a large stone that was not broken up, and this may mean that you need more treatment. °· Some pain or discomfort during urination. °· Some pain or discomfort in the lower abdomen or (in men) at the base of the penis. ° °Follow these instructions at home: °Medicines °· Take over-the-counter and prescription medicines only as told by your health care provider. °· If you were prescribed an antibiotic medicine, take it as told by your health care provider. Do not stop taking the antibiotic even if you start to feel better. °· Do not drive for 24 hours if you were given a medicine to help you relax (sedative). °· Do not drive or use heavy machinery while taking prescription pain medicine. °Eating and drinking °· Drink enough water and fluids to keep your urine clear or pale yellow. This helps any remaining pieces of the stone to pass. It can also help prevent new stones from forming. °· Eat plenty of fresh fruits and vegetables. °· Follow instructions from your health care provider about eating and drinking restrictions. You may be  instructed: °? To reduce how much salt (sodium) you eat or drink. Check ingredients and nutrition facts on packaged foods and beverages. °? To reduce how much meat you eat. °· Eat the recommended amount of calcium for your age and gender. Ask your health care provider how much calcium you should have. °General instructions °· Get plenty of rest. °· Most people can resume normal activities 1-2 days after the procedure. Ask your health care provider what activities are safe for you. °· If directed, strain all urine through the strainer that was provided by your health care provider. °? Keep all fragments for your health care provider to see. Any stones that are found may be sent to a medical lab for examination. The stone may be as small as a grain of salt. °· Keep all follow-up visits as told by your health care provider. This is important. °Contact a health care provider if: °· You have pain that is severe or does not get better with medicine. °· You have nausea that is severe or does not go away. °· You have blood in your urine longer than your health care provider told you to expect. °· You have more blood in your urine. °· You have pain during urination that does not go away. °· You urinate more frequently than usual and this does not go away. °· You develop a rash or any other possible signs of an allergic reaction. °Get help right away if: °· You have severe pain in   your back, sides, or upper abdomen. °· You have severe pain while urinating. °· Your urine is very dark red. °· You have blood in your stool (feces). °· You cannot pass any urine at all. °· You feel a strong urge to urinate after emptying your bladder. °· You have a fever or chills. °· You develop shortness of breath, difficulty breathing, or chest pain. °· You have severe nausea that leads to persistent vomiting. °· You faint. °Summary °· After this procedure, it is common to have some pain, discomfort, or nausea when pieces (fragments) of the  kidney stone move through the tube that carries urine from the kidney to the bladder (ureter). If this pain or nausea is severe, however, you should contact your health care provider. °· Most people can resume normal activities 1-2 days after the procedure. Ask your health care provider what activities are safe for you. °· Drink enough water and fluids to keep your urine clear or pale yellow. This helps any remaining pieces of the stone to pass, and it can help prevent new stones from forming. °· If directed, strain your urine and keep all fragments for your health care provider to see. Fragments or stones may be as small as a grain of salt. °· Get help right away if you have severe pain in your back, sides, or upper abdomen or have severe pain while urinating. °This information is not intended to replace advice given to you by your health care provider. Make sure you discuss any questions you have with your health care provider. °Document Released: 01/08/2007 Document Revised: 11/10/2015 Document Reviewed: 11/10/2015 °Elsevier Interactive Patient Education © 2018 [employer] ° °

## 2017-01-08 NOTE — Interval H&P Note (Signed)
History and Physical Interval Note:  01/08/2017 8:40 AM  Justin Walters  has presented today for surgery, with the diagnosis of LEFT URETERAL STONE  The various methods of treatment have been discussed with the patient and family. After consideration of risks, benefits and other options for treatment, the patient has consented to  Procedure(s): LEFT EXTRACORPOREAL SHOCK WAVE LITHOTRIPSY (ESWL) (Left) as a surgical intervention .  The patient's history has been reviewed, patient examined, no change in status, stable for surgery.  I have reviewed the patient's chart and labs. I reviewed Dr. Gwynn Burly note. Pt well today. No CP or SOB. Questions were answered to the patient's satisfaction.     [name]

## 2017-01-08 NOTE — Op Note (Signed)
Left proximal 6 mm stone  Left ESWL   Findings - pt moved several times and even decoupled from the shock head. He required a slower shock rate. He may need a staged procedure and f/u ureteroscopy.

## 2017-01-09 ENCOUNTER — Encounter (HOSPITAL_COMMUNITY): Payer: Self-pay | Admitting: Urology

## 2017-01-17 ENCOUNTER — Other Ambulatory Visit: Payer: Self-pay | Admitting: Internal Medicine

## 2017-01-18 ENCOUNTER — Other Ambulatory Visit: Payer: Medicare HMO

## 2017-01-18 ENCOUNTER — Encounter: Payer: Self-pay | Admitting: Acute Care

## 2017-01-18 ENCOUNTER — Ambulatory Visit: Payer: Medicare HMO | Admitting: Acute Care

## 2017-01-18 ENCOUNTER — Ambulatory Visit (INDEPENDENT_AMBULATORY_CARE_PROVIDER_SITE_OTHER): Payer: Medicare HMO | Admitting: Internal Medicine

## 2017-01-18 DIAGNOSIS — J449 Chronic obstructive pulmonary disease, unspecified: Secondary | ICD-10-CM

## 2017-01-18 DIAGNOSIS — B370 Candidal stomatitis: Secondary | ICD-10-CM | POA: Diagnosis not present

## 2017-01-18 LAB — PULMONARY FUNCTION TEST
DL/VA % PRED: 77 %
DL/VA: 3.41 ml/min/mmHg/L
DLCO COR % PRED: 53 %
DLCO COR: 15.23 ml/min/mmHg
DLCO UNC % PRED: 50 %
DLCO unc: 14.19 ml/min/mmHg
FEF 25-75 POST: 0.63 L/s
FEF 25-75 PRE: 0.39 L/s
FEF2575-%CHANGE-POST: 61 %
FEF2575-%PRED-PRE: 20 %
FEF2575-%Pred-Post: 33 %
FEV1-%Change-Post: 18 %
FEV1-%PRED-PRE: 33 %
FEV1-%Pred-Post: 39 %
FEV1-POST: 1.05 L
FEV1-Pre: 0.88 L
FEV1FVC-%CHANGE-POST: 5 %
FEV1FVC-%PRED-PRE: 70 %
FEV6-%CHANGE-POST: 10 %
FEV6-%Pred-Post: 54 %
FEV6-%Pred-Pre: 49 %
FEV6-Post: 1.88 L
FEV6-Pre: 1.71 L
FEV6FVC-%Change-Post: -1 %
FEV6FVC-%Pred-Post: 104 %
FEV6FVC-%Pred-Pre: 105 %
FVC-%Change-Post: 12 %
FVC-%PRED-POST: 52 %
FVC-%PRED-PRE: 46 %
FVC-POST: 1.95 L
FVC-PRE: 1.74 L
POST FEV1/FVC RATIO: 54 %
PRE FEV1/FVC RATIO: 51 %
Post FEV6/FVC ratio: 97 %
Pre FEV6/FVC Ratio: 98 %
RV % pred: 216 %
RV: 5.23 L
TLC % pred: 111 %
TLC: 7.16 L

## 2017-01-18 MED ORDER — NYSTATIN 100000 UNIT/ML MT SUSP: OROMUCOSAL | 0 refills | Status: DC

## 2017-01-18 NOTE — Progress Notes (Signed)
PFT done today. 

## 2017-01-18 NOTE — Patient Instructions (Addendum)
It is nice to meet you today We will check an Alpha 1 level today. We will schedule you for PFT's  We will schedule you for a 6 minute walk. Continue Symbicort 2 puffs twice daily every day without fail Rinse mouth after use. Nystatin mouthwash 5cc's 3-4 times a day,  swish and spit x 7 days. Note your daily symptoms > remember "red flags" for COPD:  Increase in cough, increase in sputum production, change in color of sputum,increase in shortness of breath or activity tolerance. If you notice these symptoms, please call to be seen. Follow up with Dr. Melvyn Novas in 1-2 weeks   Please contact office for sooner follow up if symptoms do not improve or worsen or seek emergency care  Consider adding PPI  Call your insurance and ask which Combination inhaled corticosteroid and long acting beta-2 agonist is cheapest on your plan.

## 2017-01-18 NOTE — Progress Notes (Addendum)
History of Present Illness Justin Walters is a 77 y.o. male  Former smoker ( [place] with a 102 PPY smoking history) with COPD. Hyperlipidemia, HTN and CKD. He was referred by Internal Medicine  after recent hospitalization 12/24-12/25/18  for COPD exacerbation. Previously managed by Justin Walters, PCP.   01/18/2017  Hospital Follow Up: Pt. Presents for hospital follow up. Patient was admitted 12/25/2016-12/26/2016 for COPD exacerbation.Patient is a 77 year old male with past medical history of COPD who presented to the emergency department with complaints of several days of wheezing and shortness of breath.  Patient  did not report any fever at home but was having bothering cough. He denied any chest pain on presentation.  Saturation on presentation to the ED  was upper 80s on RA. Marland Kitchen  Patient was started on supplemental oxygen and nebulization. The morning of 12/26/16  the  patient was  feeling better.    Patient ambulated with sats saturated 92-94% without oxygen , and was not wheezing.He was discharged home on  prednisone and azithromycin.Instructions were to follow-up with his primary care physician in a week. Patient did  not have any history of recent pulmonary function test done.  He is only on albuterol at home. Hospital Internal medicine physician  recommend  He  follow-up with pulmonology as an outpatient. Per the patient he did complete his azithromycin and prednisone taper upon discharge. He was seen again by Justin Walters 01/04/2017 for what appeared to be another exacerbation of his COPD  With 2-3 day history of HA, general weakness and malaise, with prod cough greenish sputum. At the time he denied any shortness of breath.He was treated at this time with Levaquin, prednisone taper and was started on Symbicort inhaler. He presents today for initial encounter with pulmonary for COPD. Pt. States he did complete his Levaquin and prednisone  that Justin Walters prescribed. He states he is doing much better. He  states he has baseline shortness of breath. He has  been on Symbicort and is compliant with use every day. This was just started 01/04/2017. Prior to this the patient has not had any maintenance medication, but states he was diagnosed with COPD about 10 years ago by Justin Walters. There are no PFT's on file. He states he has never had a .Pt. States he has exertional shortness of breath every day.Carrying heavy bags do trigger worsening shortness of breath. Sitting down resolves his shortness of breath. He is currently coughing, it is a non-productive cough. He states it is no worse when he lays down. The patient does have a rescue inhaler. He states he rarely has to use it.He states he does have occasional heartburn, but does not tale a PPI.He states he doesn't not cough often.He denies fever, chest pain, orthopnea or hemoptysis.    Test Results  Alpha 1 : 1/17/2-19>> A-1 Antitrypsin, Ser 83 - 199 mg/dL 190   ALPHA-1-ANTITRYPSIN (AAT) PHENOTYPE  SEE NOTE   Comment: THIS PATIENT'S ALPHA-1-ANTITRYPSIN PHENOTYPE IS PI*MM.  Marland Kitchen     CXR 12/25/2017 IMPRESSION: No acute disease. Emphysema. Atherosclerosis.   CBC Latest Ref Rng & Units 12/26/2016 12/25/2016 07/19/2016  WBC 4.0 - 10.5 K/uL 7.6 7.2 5.6  Hemoglobin 13.0 - 17.0 g/dL 14.3 14.2 13.0  Hematocrit 39.0 - 52.0 % 41.6 43.6 39.6  Platelets 150 - 400 K/uL 161 159 224.0    BMP Latest Ref Rng & Units 12/26/2016 12/25/2016 07/19/2016  Glucose 65 - 99 mg/dL 131(H) 99 107(H)  BUN 6 - 20  mg/dL 27(H) 22(H) 17  Creatinine 0.61 - 1.24 mg/dL 1.21 1.26(H) 1.74(H)  Sodium 135 - 145 mmol/L 138 141 142  Potassium 3.5 - 5.1 mmol/L 4.4 4.0 4.8  Chloride 101 - 111 mmol/L 102 103 103  CO2 22 - 32 mmol/L 28 30 31   Calcium 8.9 - 10.3 mg/dL 9.2 9.0 10.0    BNP    Component Value Date/Time   BNP 116.0 (H) 12/25/2016 1425      Dg Chest 2 View  Result Date: 12/25/2016 CLINICAL DATA:  Worsening of chronic shortness of breath. History of COPD. EXAM: CHEST  2  VIEW COMPARISON:  PA and lateral chest 11/15/2016 and 09/30/2015. FINDINGS: The chest is hyperexpanded with attenuation of the pulmonary vasculature. Lungs are clear. No pneumothorax or pleural effusion. Heart size is normal. Aortic atherosclerosis noted. No focal bony abnormality. IMPRESSION: No acute disease. Emphysema. Atherosclerosis. Electronically Signed   By: Justin Walters M.D.   On: 12/25/2016 09:56   Dg Abd 1 View  Result Date: 01/08/2017 CLINICAL DATA:  Preoperative examination prior to procedure for removal of left ureteral stone. EXAM: ABDOMEN - 1 VIEW COMPARISON:  KUB of December 19, 2016. FINDINGS: There is a stable calcification projecting 2 cm to the left of the L4-5 disc space consistent with the proximal left ureteral stone. There remain coarse stones over the lower pole of the left kidney. The bowel gas pattern is normal. Numerous coils from previous abdominal wall repair are present. IMPRESSION: Stable approximately 5 x 6 mm stone in the distal aspect of the proximal left ureter. Multiple smaller lower pole left-sided kidney stones are present. Electronically Signed   By: Justin  Walters M.D.   On: 01/08/2017 07:27     Past medical hx Past Medical History:  Diagnosis Date  . At risk for sleep apnea    STOP-BANG= 5    SENT TO PCP 06-13-2013  . Bladder stone   . BPH (benign prostatic hyperplasia)   . COPD (chronic obstructive pulmonary disease) ([place])   . Diverticulosis of colon   . Dysuria   . Glucose intolerance (impaired glucose tolerance)   . History of kidney stones   . Hyperlipidemia   . Hypertension   . Ureteral disorder 2018   ureteral repair     Social History   Tobacco Use  . Smoking status: Former Smoker    Packs/day: 3.00    Years: 34.00    Pack years: 102.00    Types: Cigarettes    Last attempt to quit: 01/02/1993    Years since quitting: 24.0  . Smokeless tobacco: Never Used  Substance Use Topics  . Alcohol use: No    Alcohol/week: 2.4 oz    Types:  4 Standard drinks or equivalent per week    Frequency: Never  . Drug use: No    Mr.Smet reports that he quit smoking about 24 years ago. His smoking use included cigarettes. He has a 102.00 pack-year smoking history. he has never used smokeless tobacco. He reports that he does not drink alcohol or use drugs.  Tobacco Cessation: Former smoker quit 1995 with a 102 pack year smoking history  Past surgical hx, Family hx, Social hx all reviewed.  Current Outpatient Medications on File Prior to Visit  Medication Sig  . albuterol (PROVENTIL HFA;VENTOLIN HFA) 108 (90 Base) MCG/ACT inhaler Inhale 2 puffs into the lungs every 6 (six) hours as needed for wheezing or shortness of breath.  Marland Kitchen amLODipine (NORVASC) 5 MG tablet TAKE ONE TABLET BY  MOUTH ONCE DAILY  . budesonide-formoterol (SYMBICORT) 80-4.5 MCG/ACT inhaler Inhale 2 puffs into the lungs 2 (two) times daily.  . carvedilol (COREG) 12.5 MG tablet TAKE 1 TABLET TWICE DAILY WITH MEALS  . DiphenhydrAMINE HCl, Sleep, (ZZZQUIL PO) Take by mouth.  . gabapentin (NEURONTIN) 300 MG capsule TAKE 1 CAPSULE THREE TIMES DAILY  . guaiFENesin (MUCINEX) 600 MG 12 hr tablet Take 1 tablet (600 mg total) by mouth 2 (two) times daily.  . rosuvastatin (CRESTOR) 20 MG tablet TAKE 1 TABLET EVERY DAY  . terazosin (HYTRIN) 10 MG capsule TAKE 1 CAPSULE AT BEDTIME  . aspirin 81 MG EC tablet Take 1 tablet (81 mg total) by mouth daily. Swallow whole. (Patient not taking: Reported on 01/18/2017)   No current facility-administered medications on file prior to visit.      No Known Allergies  Review Of Systems:  Constitutional:   No  weight loss, night sweats,  Fevers, chills, fatigue, or  lassitude.  HEENT:   No headaches,  Difficulty swallowing,  Tooth/dental problems, or  Sore throat,                No sneezing, itching, ear ache, nasal congestion, post nasal drip,   CV:  No chest pain,  Orthopnea, PND, swelling in lower extremities, anasarca, dizziness,  palpitations, syncope.   GI  No heartburn, indigestion, abdominal pain, nausea, vomiting, diarrhea, change in bowel habits, loss of appetite, bloody stools.   Resp: + shortness of breath with exertion less at rest.  No excess mucus, no productive cough,  + non-productive cough,  No coughing up of blood.  No change in color of mucus.  No wheezing.  No chest wall deformity  Skin: no rash or lesions.  GU: no dysuria, change in color of urine, no urgency or frequency.  No flank pain, no hematuria   MS:  No joint pain or swelling.  No decreased range of motion.  No back pain.  Psych:  No change in mood or affect. No depression or anxiety.  No memory loss.   Vital Signs BP 112/78 (BP Location: Right Arm, Cuff Size: Normal)   Pulse 80   Ht 5\' 7"  (1.702 m)   Wt 150 lb (68 kg)   SpO2 94%   BMI 23.49 kg/m    Physical Exam:  General- No distress,  A&Ox3, pleasant ENT: No sinus tenderness, TM clear, pale nasal mucosa, no oral exudate,no post nasal drip, no LAN Cardiac: S1, S2, regular rate and rhythm, no murmur Chest: No wheeze/ rales/ dullness; no accessory muscle use, no nasal flaring, no sternal retractions, diminished per bases. Abd.: Soft Non-tender, non-distended Ext: No clubbing cyanosis, edema Neuro:  normal strength, MAE x 4, A&Ox3 Skin: No rashes, warm and dry Psych: normal mood and behavior   Assessment/Plan  COPD (chronic obstructive pulmonary disease) New consult Frequent exacerbations, last ED visit 12/25/2016 Plan: We will check an Alpha 1 level today. We will schedule you for PFT's  We will schedule you for a 6 minute walk. Continue Symbicort 2 puffs twice daily every day without fail Rinse mouth after use. Nystatin mouthwash 5cc's 3-4 times a day,  swish and spit x 7 days. Note your daily symptoms > remember "red flags" for COPD:  Increase in cough, increase in sputum production, change in color of sputum,increase in shortness of breath or activity tolerance. If  you notice these symptoms, please call to be seen. Follow up with Dr. Melvyn Novas in 1-2 weeks   Please contact office  for sooner follow up if symptoms do not improve or worsen or seek emergency care    Thrush Oral thrush on exam Plan: Nystatin mouthwash rinse and spit 5 cc's 3-4 times daily x 7 days Follow up in 2 weeks with Dr Tilda Burrow, NP 01/18/2017  9:42 AM

## 2017-01-18 NOTE — Assessment & Plan Note (Signed)
Oral thrush on exam Plan: Nystatin mouthwash rinse and spit 5 cc's 3-4 times daily x 7 days Follow up in 2 weeks with Dr Melvyn Novas

## 2017-01-18 NOTE — Assessment & Plan Note (Signed)
New consult Frequent exacerbations, last ED visit 12/25/2016 Plan: We will check an Alpha 1 level today. We will schedule you for PFT's  We will schedule you for a 6 minute walk. Continue Symbicort 2 puffs twice daily every day without fail Rinse mouth after use. Nystatin mouthwash 5cc's 3-4 times a day,  swish and spit x 7 days. Note your daily symptoms > remember "red flags" for COPD:  Increase in cough, increase in sputum production, change in color of sputum,increase in shortness of breath or activity tolerance. If you notice these symptoms, please call to be seen. Follow up with Dr. Melvyn Novas in 1-2 weeks   Please contact office for sooner follow up if symptoms do not improve or worsen or seek emergency care

## 2017-01-19 NOTE — Progress Notes (Signed)
Chart and office note reviewed in detail  > agree with a/p as outlined    

## 2017-01-25 ENCOUNTER — Ambulatory Visit: Payer: Medicare HMO | Admitting: Internal Medicine

## 2017-01-25 ENCOUNTER — Encounter: Payer: Self-pay | Admitting: Internal Medicine

## 2017-01-25 ENCOUNTER — Ambulatory Visit (INDEPENDENT_AMBULATORY_CARE_PROVIDER_SITE_OTHER): Payer: Medicare HMO | Admitting: *Deleted

## 2017-01-25 VITALS — BP 118/78 | HR 92 | Ht 67.0 in | Wt 150.8 lb

## 2017-01-25 DIAGNOSIS — R05 Cough: Secondary | ICD-10-CM

## 2017-01-25 DIAGNOSIS — R059 Cough, unspecified: Secondary | ICD-10-CM

## 2017-01-25 DIAGNOSIS — R0902 Hypoxemia: Secondary | ICD-10-CM

## 2017-01-25 DIAGNOSIS — J449 Chronic obstructive pulmonary disease, unspecified: Secondary | ICD-10-CM | POA: Diagnosis not present

## 2017-01-25 DIAGNOSIS — J441 Chronic obstructive pulmonary disease with (acute) exacerbation: Secondary | ICD-10-CM

## 2017-01-25 MED ORDER — BUDESONIDE-FORMOTEROL FUMARATE 160-4.5 MCG/ACT IN AERO: 2.0000 | INHALATION_SPRAY | Freq: Two times a day (BID) | RESPIRATORY_TRACT | 0 refills | Status: DC

## 2017-01-25 MED ORDER — BUDESONIDE-FORMOTEROL FUMARATE 160-4.5 MCG/ACT IN AERO: 2.0000 | INHALATION_SPRAY | Freq: Two times a day (BID) | RESPIRATORY_TRACT | 11 refills | Status: DC

## 2017-01-25 MED ORDER — PREDNISONE 10 MG PO TABS: ORAL_TABLET | ORAL | 0 refills | Status: DC

## 2017-01-25 NOTE — Patient Instructions (Addendum)
Plan A = Automatic =  symbicort 160 Take 2 puffs first thing in am and then another 2 puffs about 12 hours later.   Work on inhaler technique:  relax and gently blow all the way out then take a nice smooth deep breath back in, triggering the inhaler at same time you start breathing in.  Hold for up to 5 seconds if you can. Blow out thru nose. Rinse and gargle with water when done     Plan B = Backup Only use your albuterol  as a rescue medication to be used if you can't catch your breath by resting or doing a relaxed purse lip breathing pattern.  - The less you use it, the better it will work when you need it. - Ok to use the inhaler up to 2 puffs  every 4 hours if you must but call for appointment if use goes up over your usual need - Don't leave home without it !!  (think of it like the spare tire for your car)   Prednisone 10 mg take  4 each am x 2 days,   2 each am x 2 days,  1 each am x 2 days and stop   Please schedule a follow up office visit in 4 weeks, sooner if needed with pfts and bring all medications with you

## 2017-01-25 NOTE — Progress Notes (Signed)
Pt came in for a 6 minute walk but when he walked from the lobby, his sats were at 88%. I walked him on 2L and he stayed at 91%. A six minute walk was not performed but he was qualified for oxygen. TA/CMA

## 2017-01-25 NOTE — Progress Notes (Signed)
Subjective:     Patient ID: Justin Walters, male   DOB: June 28, 1940,     MRN: [id number]  HPI  32 yowm quit smoking 1995 with cough/wheeze/ sob and never recovered and eventually admitted:  Admit date: 12/25/2016 Discharge date: 12/26/2016 Brief/Interim Summary: Patient is a 77 year old male with past medical history of COPD who presents to the emergency department with complaints of several days of wheezing and shortness of breath.  Patient  did not report any fever at home but was having bothering cough.  Denies any chest pain on presentation.  When he presented ,his saturation on room air was  in upper 80s .  Patient was started on supplemental oxygen and nebulization. This morning during my evaluation patient is already feeling better.  He had mild expiratory bilateral wheezes.  Patient ambulated in the hallway and he saturated 92-94% without oxygen. Patient will be discharged today to home with prednisone and azithromycin.  He will follow-up with his primary care physician in a week. Patient does not have any history of recent pulmonary function test done.  He is only on albuterol at home.  We will recommend him to follow-up with pulmonology as an outpatient.  Other issues addressed:  Hyperlipidemia: On medication at home.  We will resume it on discharge.  Hypertension: Currently blood pressure stable.  Will resume his home medications on discharge.  CKD: Currently kidney function is stable.    Discharge Diagnoses:  Principal Problem:   COPD exacerbation ([place]) Active Problems:   Hyperlipidemia   Essential hypertension   CKD (chronic kidney disease)    Discharge Instructions      Discharge Instructions    Diet - low sodium heart healthy   Complete by:  As directed    Discharge instructions   Complete by:  As directed    1) Take prescribed medications as instructed. 2) Follow up with your PCP in a week. 3) Follow up with Pulmonology in 2 weeks. Name and  number of the provider has been attached.   Increase activity slowly   Complete by:  As directed      Allergies as of 12/26/2016   No Known Allergies             Medication List     STOP taking these medications   benzonatate 100 MG capsule Commonly known as:  TESSALON PERLES   sildenafil 100 MG tablet Commonly known as:  VIAGRA     TAKE these medications   albuterol 108 (90 Base) MCG/ACT inhaler Commonly known as:  PROVENTIL HFA;VENTOLIN HFA Inhale 2 puffs into the lungs every 6 (six) hours as needed for wheezing or shortness of breath.   amLODipine 5 MG tablet Commonly known as:  NORVASC Take 1 tablet (5 mg total) by mouth daily.   aspirin 81 MG EC tablet Take 81 mg by mouth daily. Swallow whole.   azithromycin 250 MG tablet Commonly known as:  ZITHROMAX Take daily for three days from tomorrow Start taking on:  12/27/2016   budesonide-formoterol 160-4.5 MCG/ACT inhaler Commonly known as:  SYMBICORT Inhale 2 puffs into the lungs 2 (two) times daily.   carvedilol 12.5 MG tablet Commonly known as:  COREG TAKE 1 TABLET TWICE DAILY WITH MEALS   gabapentin 300 MG capsule Commonly known as:  NEURONTIN TAKE 1 CAPSULE THREE TIMES DAILY   guaiFENesin 600 MG 12 hr tablet Commonly known as:  MUCINEX Take 1 tablet (600 mg total) by mouth 2 (two) times daily.   predniSONE  20 MG tablet Commonly known as:  DELTASONE Take 2 tablets (40 mg total) by mouth daily for 4 days. What changed:    medication strength  how much to take  how to take this  when to take this  additional instructions   rosuvastatin 20 MG tablet Commonly known as:  CRESTOR TAKE 1 TABLET EVERY DAY   terazosin 10 MG capsule Commonly known as:  HYTRIN TAKE 1 CAPSULE AT BEDTIME   tiotropium 18 MCG inhalation capsule Commonly known as:  SPIRIVA HANDIHALER Place 1 capsule (18 mcg total) into inhaler and inhale daily.   ZZZQUIL PO Take by mouth.          01/25/2017 1st  office visit/ Duquan Gillooly   GOLD III with reversibility  Chief Complaint  Patient presents with  . Pulmonary Consult    Referred by the hospital. He c/o SOB and coughing for the past several wks. His cough is occ prod, but he is unsure of sputum color. He is SOB with exertion and also when he lies down.  He gets winded walking approx 100 yards. He is using his albuterol inhaler 3 x per wk on average.   baseline doe across the street to son's house  nl pace and then onset   x one year gradually worse doe and no better on inhalers but poor baseline hfa/ assoc with cough / sense of chest congestion and has to prop up at 30 degrees now at hs to get comfortable Present doe = MMRC3 = can't walk 100 yards even at a slow pace at a flat grade s stopping due to sob   Easily confused with details of care/ names of meds / not using spiriva/ was better on pred and worse since tapered   No obvious day to day or daytime variability or assoc excess/ purulent sputum or mucus plugs or hemoptysis or cp or chest tightness, subjective wheeze or overt sinus or hb symptoms. No unusual exposure hx or h/o childhood pna/ asthma or knowledge of premature birth.  Sleeping ok at 30 degrees without nocturnal  or early am exacerbation  of respiratory  c/o's or need for noct saba. Also denies any obvious fluctuation of symptoms with weather or environmental changes or other aggravating or alleviating factors except as outlined above   Current Allergies, Complete Past Medical History, Past Surgical History, Family History, and Social History were reviewed in [employer] record.  ROS  The following are not active complaints unless bolded Hoarseness, sore throat, dysphagia, dental problems, itching, sneezing,  nasal congestion or discharge of excess mucus or purulent secretions, ear ache,   fever, chills, sweats, unintended wt loss or wt gain, classically pleuritic or exertional cp,   orthopnea pnd or leg swelling, presyncope, palpitations, abdominal pain, anorexia, nausea, vomiting, diarrhea  or change in bowel habits or change in bladder habits, change in stools or change in urine, dysuria, hematuria,  rash, arthralgias, visual complaints, headache, numbness, weakness or ataxia or problems with walking or coordination,  change in mood/affect or memory.        Current Meds  Medication Sig  . albuterol (PROVENTIL HFA;VENTOLIN HFA) 108 (90 Base) MCG/ACT inhaler Inhale 2 puffs into the lungs every 6 (six) hours as needed for wheezing or shortness of breath.  Marland Kitchen aspirin 81 MG EC tablet Take 1 tablet (81 mg total) by mouth daily. Swallow whole.  . carvedilol (COREG) 12.5 MG tablet TAKE 1 TABLET TWICE DAILY WITH MEALS  . DiphenhydrAMINE HCl, Sleep, (  ZZZQUIL PO) Take by mouth as needed.   . gabapentin (NEURONTIN) 300 MG capsule TAKE 1 CAPSULE THREE TIMES DAILY  . guaiFENesin (MUCINEX) 600 MG 12 hr tablet Take 1 tablet (600 mg total) by mouth 2 (two) times daily.  . rosuvastatin (CRESTOR) 20 MG tablet TAKE 1 TABLET EVERY DAY  . terazosin (HYTRIN) 10 MG capsule TAKE 1 CAPSULE AT BEDTIME  .   budesonide-formoterol (SYMBICORT) 80-4.5 MCG/ACT inhaler Inhale 2 puffs into the lungs 2 (two) times daily.        Review of Systems     Objective:   Physical Exam    hoarse wm nad / upper partial   Wt Readings from Last 3 Encounters:  01/25/17 150 lb 12.8 oz (68.4 kg)  01/18/17 150 lb (68 kg)  01/08/17 150 lb (68 kg)     Vital signs reviewed - Note on arrival 02 sats  93% on RA     HEENT: nl dentition, turbinates bilaterally, and oropharynx. Nl external ear canals without cough reflex   NECK :  without JVD/Nodes/TM/ nl carotid upstrokes bilaterally   LUNGS: no acc muscle use,  slt barrel contour with insp and exp rhonchi bilaterally/ hyper resonant to percussion    CV:  RRR  no s3 or murmur or increase in P2, and no edema   ABD:  soft and nontender with nl inspiratory  excursion in the supine position. No bruits or organomegaly appreciated, bowel sounds nl  MS:  Slow  gait/ ext warm without deformities, calf tenderness, cyanosis or clubbing No obvious joint restrictions   SKIN: warm and dry without lesions    NEURO:  alert, approp, nl sensorium with  no motor or cerebellar deficits apparent.      I personally reviewed images and agree with radiology impression as follows:  CXR:   12/25/16 No acute disease. Emphysema.   Labs ordered 01/25/2017   Alpha one AT screen   Assessment:

## 2017-01-26 ENCOUNTER — Encounter: Payer: Self-pay | Admitting: Internal Medicine

## 2017-01-26 LAB — ALPHA-1 ANTITRYPSIN PHENOTYPE: A-1 Antitrypsin, Ser: 190 mg/dL (ref 83–199)

## 2017-01-26 NOTE — Assessment & Plan Note (Signed)
01/25/2017 Patient Saturations on Room Air at Rest = 88% Patient Saturations on [employer] while Ambulating = 86% Patient Saturations on 2 Liters of oxygen while Ambulating = 91%  The new guidelines on stable copd do not support using 02 until/ unless any reversible coponent is corrected and we prove he can and will walk farther/ faster on 02 than s 02 which may be best accomplished in a formal pulmonary rehab program.

## 2017-01-26 NOTE — Assessment & Plan Note (Addendum)
PFT's  01/20/2017  FEV1 1.05 (39 % ) ratio 54  p 18 % improvement from saba p ? prior to study with DLCO  50/53  % corrects to 77 % for alv volume  - 01/25/2017  After extensive coaching inhaler device  effectiveness =    75% from a baseline on 25% so rec increase symb to 160 2bid trial - alpha one AT screening sent   Clearly there is a large asthmatic component here that should be max treated before any additional rx based on what he reports to be relatively good baseline ex tol a year ago and now has ex related desaturations that will need to be followed up on as well (see sep a/p)  The coreg may be an issue in this setting : In the setting of  Documented asthmatic component   It would be preferable to use bystolic, the most beta -1  selective Beta blocker available in sample form, with bisoprolol the most selective generic choice  on the market, at least on a trial basis, to make sure the spillover Beta 2 effects of the less specific Beta blockers are not contributing to this patient's symptoms.    F/u in 6 weeks with all meds in hand using a trust but verify approach to confirm accurate Medication  Reconciliation The principal here is that until we are certain that the  patients are doing what we've asked, it makes no sense to ask them to do more.   Total time devoted to counseling  > 50 % of initial 60 min office visit:  review case with pt/ discussion of options/alternatives/ personally creating written customized instructions  in presence of pt  then going over those specific  Instructions directly with the pt including how to use all of the meds but in particular covering each new medication in detail and the difference between the maintenance= "automatic" meds and the prns using an action plan format for the latter (If this problem/symptom => do that organization reading Left to right).  Please see AVS from this visit for a full list of these instructions which I personally wrote for this pt and   are unique to this visit.

## 2017-01-29 DIAGNOSIS — N202 Calculus of kidney with calculus of ureter: Secondary | ICD-10-CM | POA: Diagnosis not present

## 2017-02-01 ENCOUNTER — Telehealth: Payer: Self-pay | Admitting: Internal Medicine

## 2017-02-01 NOTE — Telephone Encounter (Signed)
Please call patient and let him know his Alpha 1 was negative.   Called and spoke with pt and he is aware of results per SG.

## 2017-02-06 DIAGNOSIS — N200 Calculus of kidney: Secondary | ICD-10-CM | POA: Diagnosis not present

## 2017-02-06 DIAGNOSIS — N529 Male erectile dysfunction, unspecified: Secondary | ICD-10-CM | POA: Diagnosis not present

## 2017-02-06 DIAGNOSIS — Z87438 Personal history of other diseases of male genital organs: Secondary | ICD-10-CM | POA: Diagnosis not present

## 2017-02-06 DIAGNOSIS — N99114 Postprocedural urethral stricture, male, unspecified: Secondary | ICD-10-CM | POA: Diagnosis not present

## 2017-02-06 DIAGNOSIS — N183 Chronic kidney disease, stage 3 (moderate): Secondary | ICD-10-CM | POA: Diagnosis not present

## 2017-02-23 ENCOUNTER — Ambulatory Visit (INDEPENDENT_AMBULATORY_CARE_PROVIDER_SITE_OTHER): Payer: Medicare HMO | Admitting: Internal Medicine

## 2017-02-23 ENCOUNTER — Ambulatory Visit: Payer: Medicare HMO | Admitting: Internal Medicine

## 2017-02-23 ENCOUNTER — Encounter: Payer: Self-pay | Admitting: Internal Medicine

## 2017-02-23 VITALS — BP 106/60 | HR 84 | Ht 67.0 in | Wt 154.0 lb

## 2017-02-23 DIAGNOSIS — R05 Cough: Secondary | ICD-10-CM | POA: Diagnosis not present

## 2017-02-23 DIAGNOSIS — J449 Chronic obstructive pulmonary disease, unspecified: Secondary | ICD-10-CM

## 2017-02-23 DIAGNOSIS — R059 Cough, unspecified: Secondary | ICD-10-CM

## 2017-02-23 LAB — PULMONARY FUNCTION TEST
DL/VA % pred: 59 %
DL/VA: 2.63 ml/min/mmHg/L
DLCO UNC: 11.28 ml/min/mmHg
DLCO unc % pred: 39 %
FEF 25-75 Post: 0.43 L/sec
FEF 25-75 Pre: 0.47 L/sec
FEF2575-%Change-Post: -9 %
FEF2575-%Pred-Post: 22 %
FEF2575-%Pred-Pre: 24 %
FEV1-%CHANGE-POST: 0 %
FEV1-%PRED-POST: 44 %
FEV1-%PRED-PRE: 43 %
FEV1-Post: 1.17 L
FEV1-Pre: 1.16 L
FEV1FVC-%Change-Post: 0 %
FEV1FVC-%Pred-Pre: 75 %
FEV6-%CHANGE-POST: 0 %
FEV6-%PRED-POST: 59 %
FEV6-%Pred-Pre: 59 %
FEV6-POST: 2.05 L
FEV6-PRE: 2.06 L
FEV6FVC-%CHANGE-POST: -1 %
FEV6FVC-%PRED-POST: 102 %
FEV6FVC-%PRED-PRE: 104 %
FVC-%Change-Post: 1 %
FVC-%Pred-Post: 58 %
FVC-%Pred-Pre: 57 %
FVC-Post: 2.15 L
FVC-Pre: 2.13 L
PRE FEV6/FVC RATIO: 97 %
Post FEV1/FVC ratio: 54 %
Post FEV6/FVC ratio: 95 %
Pre FEV1/FVC ratio: 55 %
RV % PRED: 144 %
RV: 3.48 L
TLC % PRED: 90 %
TLC: 5.83 L

## 2017-02-23 MED ORDER — ACLIDINIUM BROMIDE 400 MCG/ACT IN AEPB: 1.0000 | INHALATION_SPRAY | Freq: Two times a day (BID) | RESPIRATORY_TRACT | 0 refills | Status: DC

## 2017-02-23 MED ORDER — ACLIDINIUM BROMIDE 400 MCG/ACT IN AEPB: 1.0000 | INHALATION_SPRAY | Freq: Two times a day (BID) | RESPIRATORY_TRACT | 11 refills | Status: DC

## 2017-02-23 MED ORDER — BUDESONIDE-FORMOTEROL FUMARATE 160-4.5 MCG/ACT IN AERO: 2.0000 | INHALATION_SPRAY | Freq: Two times a day (BID) | RESPIRATORY_TRACT | 0 refills | Status: DC

## 2017-02-23 NOTE — Progress Notes (Addendum)
Subjective:     Patient ID: Justin Walters, male   DOB: 05/07/1940,     MRN: [id number]  HPI  3 yowm quit smoking 1995 with cough/wheeze/ sob and never recovered with documented GOLD III copd 02/23/2017      admitted:  Admit date: 12/25/2016 Discharge date: 12/26/2016 Brief/Interim Summary: Patient is a 77 year old male with past medical history of COPD who presents to the emergency department with complaints of several days of wheezing and shortness of breath.  Patient  did not report any fever at home but was having bothering cough.  Denies any chest pain on presentation.  When he presented ,his saturation on room air was  in upper 80s .  Patient was started on supplemental oxygen and nebulization. This morning during my evaluation patient is already feeling better.  He had mild expiratory bilateral wheezes.  Patient ambulated in the hallway and he saturated 92-94% without oxygen. Patient will be discharged today to home with prednisone and azithromycin.  He will follow-up with his primary care physician in a week. Patient does not have any history of recent pulmonary function test done.  He is only on albuterol at home.  We will recommend him to follow-up with pulmonology as an outpatient.  Other issues addressed:  Hyperlipidemia: On medication at home.  We will resume it on discharge.  Hypertension: Currently blood pressure stable.  Will resume his home medications on discharge.  CKD: Currently kidney function is stable.    Discharge Diagnoses:  Principal Problem:   COPD exacerbation ([place]) Active Problems:   Hyperlipidemia   Essential hypertension   CKD (chronic kidney disease)    Discharge Instructions      Discharge Instructions    Diet - low sodium heart healthy   Complete by:  As directed    Discharge instructions   Complete by:  As directed    1) Take prescribed medications as instructed. 2) Follow up with your PCP in a week. 3) Follow up with  Pulmonology in 2 weeks. Name and number of the provider has been attached.   Increase activity slowly   Complete by:  As directed      Allergies as of 12/26/2016   No Known Allergies             Medication List     STOP taking these medications   benzonatate 100 MG capsule Commonly known as:  TESSALON PERLES   sildenafil 100 MG tablet Commonly known as:  VIAGRA     TAKE these medications   albuterol 108 (90 Base) MCG/ACT inhaler Commonly known as:  PROVENTIL HFA;VENTOLIN HFA Inhale 2 puffs into the lungs every 6 (six) hours as needed for wheezing or shortness of breath.   amLODipine 5 MG tablet Commonly known as:  NORVASC Take 1 tablet (5 mg total) by mouth daily.   aspirin 81 MG EC tablet Take 81 mg by mouth daily. Swallow whole.   azithromycin 250 MG tablet Commonly known as:  ZITHROMAX Take daily for three days from tomorrow Start taking on:  12/27/2016   budesonide-formoterol 160-4.5 MCG/ACT inhaler Commonly known as:  SYMBICORT Inhale 2 puffs into the lungs 2 (two) times daily.   carvedilol 12.5 MG tablet Commonly known as:  COREG TAKE 1 TABLET TWICE DAILY WITH MEALS   gabapentin 300 MG capsule Commonly known as:  NEURONTIN TAKE 1 CAPSULE THREE TIMES DAILY   guaiFENesin 600 MG 12 hr tablet Commonly known as:  MUCINEX Take 1 tablet (600 mg total)  by mouth 2 (two) times daily.   predniSONE 20 MG tablet Commonly known as:  DELTASONE Take 2 tablets (40 mg total) by mouth daily for 4 days. What changed:    medication strength  how much to take  how to take this  when to take this  additional instructions   rosuvastatin 20 MG tablet Commonly known as:  CRESTOR TAKE 1 TABLET EVERY DAY   terazosin 10 MG capsule Commonly known as:  HYTRIN TAKE 1 CAPSULE AT BEDTIME   tiotropium 18 MCG inhalation capsule Commonly known as:  SPIRIVA HANDIHALER Place 1 capsule (18 mcg total) into inhaler and inhale daily.   ZZZQUIL  PO Take by mouth.         01/25/2017 1st  office visit/ Justin Walters   GOLD III with reversibility  Chief Complaint  Patient presents with  . Pulmonary Consult    Referred by the hospital. He c/o SOB and coughing for the past several wks. His cough is occ prod, but he is unsure of sputum color. He is SOB with exertion and also when he lies down.  He gets winded walking approx 100 yards. He is using his albuterol inhaler 3 x per wk on average.   baseline doe across the street to son's house  nl pace and then onset   x one year gradually worse doe and no better on inhalers but poor baseline hfa/ assoc with cough / sense of chest congestion and has to prop up at 30 degrees now at hs to get comfortable Present doe = MMRC3 = can't walk 100 yards even at a slow pace at a flat grade s stopping due to sob   Easily confused with details of care/ names of meds / not using spiriva/ was better on pred and worse since tapered rec Plan A = Automatic =  symbicort 160 Take 2 puffs first thing in am and then another 2 puffs about 12 hours later.  Work on inhaler technique:  Plan B = Backup Only use your albuterol  as a rescue  Prednisone 10 mg take  4 each am x 2 days,   2 each am x 2 days,  1 each am x 2 days and stop    02/23/2017  f/u ov/Justin Walters re:  GOLD III copd / no 02 Chief Complaint  Patient presents with  . Follow-up    PFT's today.  Breathing has improved some. He is rarely using his albuterol inhaler. He has been feeling fatigued.    better p prednisone then gradually worse  Dyspnea:  MMRC2 = can't walk a nl pace on a flat grade s sob but does fine slow and flat  Cough: none Sleep: ok 30 degrees no am congestion   No obvious day to day or daytime variability or assoc excess/ purulent sputum or mucus plugs or hemoptysis or cp or chest tightness, subjective wheeze or overt sinus or hb symptoms. No unusual exposure hx or h/o childhood pna/ asthma or knowledge of premature birth.  Sleeping ok 30  degrees  without nocturnal  or early am exacerbation  of respiratory  c/o's or need for noct saba. Also denies any obvious fluctuation of symptoms with weather or environmental changes or other aggravating or alleviating factors except as outlined above   Current Allergies, Complete Past Medical History, Past Surgical History, Family History, and Social History were reviewed in [employer] record.  ROS  The following are not active complaints unless bolded Hoarseness, sore throat,  dysphagia, dental problems, itching, sneezing,  nasal congestion or discharge of excess mucus or purulent secretions, ear ache,   fever, chills, sweats, unintended wt loss or wt gain, classically pleuritic or exertional cp,  orthopnea pnd or leg swelling, presyncope, palpitations, abdominal pain, anorexia, nausea, vomiting, diarrhea  or change in bowel habits or change in bladder habits, change in stools or change in urine, dysuria, hematuria,  rash, arthralgias, visual complaints, headache, numbness, weakness or ataxia or problems with walking or coordination,  change in mood/affect or memory.        Current Meds  Medication Sig  . albuterol (PROVENTIL HFA;VENTOLIN HFA) 108 (90 Base) MCG/ACT inhaler Inhale 2 puffs into the lungs every 6 (six) hours as needed for wheezing or shortness of breath.  Marland Kitchen amLODipine (NORVASC) 5 MG tablet TAKE ONE TABLET BY MOUTH ONCE DAILY  . aspirin 81 MG EC tablet Take 1 tablet (81 mg total) by mouth daily. Swallow whole.  . carvedilol (COREG) 12.5 MG tablet TAKE 1 TABLET TWICE DAILY WITH MEALS  . DiphenhydrAMINE HCl, Sleep, (ZZZQUIL PO) Take by mouth as needed.   . gabapentin (NEURONTIN) 300 MG capsule TAKE 1 CAPSULE THREE TIMES DAILY  . guaiFENesin (MUCINEX) 600 MG 12 hr tablet Take 1 tablet (600 mg total) by mouth 2 (two) times daily.  . rosuvastatin (CRESTOR) 20 MG tablet TAKE 1 TABLET EVERY DAY  . terazosin (HYTRIN) 10 MG capsule TAKE 1 CAPSULE AT BEDTIME  .  [DISCONTINUED] budesonide-formoterol (SYMBICORT) 160-4.5 MCG/ACT inhaler Inhale 2 puffs into the lungs 2 (two) times daily.                            Objective:   Physical Exam    hoarse wm nad   Wt Readings from Last 3 Encounters:  01/25/17 150 lb 12.8 oz (68.4 kg)  01/18/17 150 lb (68 kg)  01/08/17 150 lb (68 kg)      Vital signs reviewed - Note on arrival 02 sats  90% on RA      HEENT: nl  turbinates bilaterally, and oropharynx. Nl external ear canals without cough reflex - partial  Upper denture   NECK :  without JVD/Nodes/TM/ nl carotid upstrokes bilaterally   LUNGS: no acc muscle use,  Mod  barrel contour wit minimal insp/ exp rhonch bilaterally/ hyper resonant to percussion bilaterally  CV:  RRR  no s3 or murmur or increase in P2, and no edema   ABD:  soft and nontender with nl inspiratory excursion in the supine position. No bruits or organomegaly appreciated, bowel sounds nl  MS:  Nl gait/ ext warm without deformities, calf tenderness, cyanosis or clubbing No obvious joint restrictions   SKIN: warm and dry without lesions    NEURO:  alert, approp, nl sensorium with  no motor or cerebellar deficits apparent.          I personally reviewed images and agree with radiology impression as follows:  CXR:   12/25/16 No acute disease. Emphysema.     Assessment:

## 2017-02-23 NOTE — Patient Instructions (Signed)
Add tudorza one puff after symbicort in am and in pm   Work on inhaler technique:  relax and gently blow all the way out then take a nice smooth deep breath back in, triggering the inhaler at same time you start breathing in.  Hold for up to 5 seconds if you can. Blow out thru nose. Rinse and gargle with water when done     Please schedule a follow up visit in 3 months but call sooner if needed

## 2017-02-23 NOTE — Progress Notes (Signed)
PFT done today. 

## 2017-02-25 ENCOUNTER — Encounter: Payer: Self-pay | Admitting: Internal Medicine

## 2017-02-25 NOTE — Assessment & Plan Note (Signed)
Quit smoking 1995 PFT's  01/20/2017  FEV1 1.05 (39 % ) ratio 54  p 18 % improvement from saba p ? prior to study with DLCO  50/53  % corrects to 77 % for alv volume  - 01/25/2017  After extensive coaching inhaler device  effectiveness =    75% from a baseline on 25% so rec increase symb to 160 2bid trial  - alpha one AT screening  01/18/17   MM  Level 190   PFT's  02/23/2017  FEV1 1.17 (44 % ) ratio 54  p 0 % improvement from saba p symb 160 prior to study with DLCO   39 % corrects to 59 % for alv volume  - 02/23/2017  After extensive coaching inhaler device  effectiveness =    90%  With dpi > add tudorza trial     Group D in terms of symptom/risk and laba/lama/ICS  therefore appropriate rx at this point so added LAMA today and if does better then consider lama / laba if no tendency to aecopd and lama/laba/ics if there is - the response to pred strongly suggests there is a steroid resp component favoring inclusion of ics for now in his maint rx   I had an extended discussion with the patient reviewing all relevant studies completed to date and  lasting 15 to 20 minutes of a 25 minute visit    Each maintenance medication was reviewed in detail including most importantly the difference between maintenance and prns and under what circumstances the prns are to be triggered using an action plan format that is not reflected in the computer generated alphabetically organized AVS.    Please see AVS for specific instructions unique to this visit that I personally wrote and verbalized to the the pt in detail and then reviewed with pt  by my nurse highlighting any  changes in therapy recommended at today's visit to their plan of care.

## 2017-03-02 DIAGNOSIS — N183 Chronic kidney disease, stage 3 (moderate): Secondary | ICD-10-CM | POA: Diagnosis not present

## 2017-03-02 DIAGNOSIS — I129 Hypertensive chronic kidney disease with stage 1 through stage 4 chronic kidney disease, or unspecified chronic kidney disease: Secondary | ICD-10-CM | POA: Diagnosis not present

## 2017-03-02 DIAGNOSIS — N200 Calculus of kidney: Secondary | ICD-10-CM | POA: Diagnosis not present

## 2017-03-02 DIAGNOSIS — J449 Chronic obstructive pulmonary disease, unspecified: Secondary | ICD-10-CM | POA: Diagnosis not present

## 2017-03-23 ENCOUNTER — Other Ambulatory Visit: Payer: Self-pay | Admitting: Internal Medicine

## 2017-03-23 DIAGNOSIS — N202 Calculus of kidney with calculus of ureter: Secondary | ICD-10-CM | POA: Diagnosis not present

## 2017-03-29 ENCOUNTER — Other Ambulatory Visit: Payer: Self-pay | Admitting: Urology

## 2017-03-29 NOTE — Patient Instructions (Addendum)
Justin Walters  03/29/2017   Your procedure is scheduled on: 04/03/2017 Tuesday  Report to [facility] Main  Entrance             Report to admitting at   1045 AM    Call this number if you have problems the morning of surgery [phone]    Remember: Do not eat food or drink liquids :After Midnight.     Take these medicines the morning of surgery with A SIP OF WATER: Turdoza Pressair and bring, Albuterol Inhaler if needed and bring, Symbicort Inhaler and bring, Amlodipine ( Norvasc), Gabapentin, Carvedilol ( Coreg)                                 You may not have any metal on your body including hair pins and              piercings  Do not wear jewelry,  lotions, powders or perfumes, deodorant                       Men may shave face and neck.   Do not bring valuables to the hospital. Justin Walters.  Contacts, dentures or bridgework may not be worn into surgery.      Patients discharged the day of surgery will not be allowed to drive home.  Name and phone number of your driver: daughter- Justin Walters               Please read over the following fact sheets you were given: _____________________________________________________________________             [facility] - Preparing for Surgery Before surgery, you can play an important role.  Because skin is not sterile, your skin needs to be as free of germs as possible.  You can reduce the number of germs on your skin by washing with CHG (chlorahexidine gluconate) soap before surgery.  CHG is an antiseptic cleaner which kills germs and bonds with the skin to continue killing germs even after washing. Please DO NOT use if you have an allergy to CHG or antibacterial soaps.  If your skin becomes reddened/irritated stop using the CHG and inform your nurse when you arrive at Short Stay. Do not shave (including legs and underarms) for at least 48 hours prior to the first  CHG shower.  You may shave your face/neck. Please follow these instructions carefully:  1.  Shower with CHG Soap the night before surgery and the  morning of Surgery.  2.  If you choose to wash your hair, wash your hair first as usual with your  normal  shampoo.  3.  After you shampoo, rinse your hair and body thoroughly to remove the  shampoo.                           4.  Use CHG as you would any other liquid soap.  You can apply chg directly  to the skin and wash                       Gently with a scrungie or clean washcloth.  5.  Apply the CHG Soap to your body ONLY FROM THE NECK DOWN.   Do not use on face/ open                           Wound or open sores. Avoid contact with eyes, ears mouth and genitals (private parts).                       Wash face,  Genitals (private parts) with your normal soap.             6.  Wash thoroughly, paying special attention to the area where your surgery  will be performed.  7.  Thoroughly rinse your body with warm water from the neck down.  8.  DO NOT shower/wash with your normal soap after using and rinsing off  the CHG Soap.                9.  Pat yourself dry with a clean towel.            10.  Wear clean pajamas.            11.  Place clean sheets on your bed the night of your first shower and do not  sleep with pets. Day of Surgery : Do not apply any lotions/deodorants the morning of surgery.  Please wear clean clothes to the hospital/surgery center.  FAILURE TO FOLLOW THESE INSTRUCTIONS MAY RESULT IN THE CANCELLATION OF YOUR SURGERY PATIENT SIGNATURE_________________________________  NURSE SIGNATURE__________________________________  ________________________________________________________________________

## 2017-03-30 ENCOUNTER — Encounter (HOSPITAL_COMMUNITY)
Admission: RE | Admit: 2017-03-30 | Discharge: 2017-03-30 | Disposition: A | Discharge status: Home / Self Care | Payer: Medicare HMO | Source: Ambulatory Visit | Attending: Urology | Admitting: Urology

## 2017-03-30 ENCOUNTER — Other Ambulatory Visit: Payer: Self-pay

## 2017-03-30 ENCOUNTER — Encounter (HOSPITAL_COMMUNITY): Payer: Self-pay

## 2017-03-30 DIAGNOSIS — Z01812 Encounter for preprocedural laboratory examination: Secondary | ICD-10-CM | POA: Diagnosis not present

## 2017-03-30 DIAGNOSIS — N201 Calculus of ureter: Secondary | ICD-10-CM | POA: Diagnosis not present

## 2017-03-30 LAB — BASIC METABOLIC PANEL
ANION GAP: 11 (ref 5–15)
BUN: 25 mg/dL — ABNORMAL HIGH (ref 6–20)
CALCIUM: 9.3 mg/dL (ref 8.9–10.3)
CHLORIDE: 103 mmol/L (ref 101–111)
CO2: 26 mmol/L (ref 22–32)
Creatinine, Ser: 1.46 mg/dL — ABNORMAL HIGH (ref 0.61–1.24)
GFR calc Af Amer: 52 mL/min — ABNORMAL LOW (ref >60)
GFR calc non Af Amer: 45 mL/min — ABNORMAL LOW (ref >60)
GLUCOSE: 108 mg/dL — AB (ref 65–99)
POTASSIUM: 4.7 mmol/L (ref 3.5–5.1)
Sodium: 140 mmol/L (ref 135–145)

## 2017-03-30 LAB — CBC
HEMATOCRIT: 40.1 % (ref 39.0–52.0)
HEMOGLOBIN: 12.9 g/dL — AB (ref 13.0–17.0)
MCH: 29.1 pg (ref 26.0–34.0)
MCHC: 32.2 g/dL (ref 30.0–36.0)
MCV: 90.3 fL (ref 78.0–100.0)
Platelets: 180 10*3/uL (ref 150–400)
RBC: 4.44 MIL/uL (ref 4.22–5.81)
RDW: 14.6 % (ref 11.5–15.5)
WBC: 6.7 10*3/uL (ref 4.0–10.5)

## 2017-04-02 NOTE — H&P (Signed)
Office Visit Report     03/23/2017   --------------------------------------------------------------------------------   Justin Walters  MRN: [id number]  PRIMARY CARE:  Cathlean Cower, MD  DOB: [birth year], 77 year old Male  REFERRING:    SSN: [id number]  PROVIDER:  [name], M.D.    TREATING:  Jiles Crocker    LOCATION:  [facility] [phone]   --------------------------------------------------------------------------------   CC: I have had kidney stone surgery.  HPI: Justin Walters is a 77 year-old male established patient who is here for renal calculi after a surgical intervention.  He underwent a second ESWL earlier this month for left proximal ureteral calculi. He states only passing a small fragment in the last 2 weeks. Denies any new or worsening voiding symptoms. Denies left sided pain or discomfort.   03/23/17: KUB indicated stone fragments remaining in the distal portion of the left proximal ureter. No interval stone material passage, no left sided flank pain or discomfort, no changes in voiding habits: weakening/intermittent stream, hematuria, urgency, hematuria, painful voiding. He has remained afebrile.   The stone was on the left side. He had ESWL for treatment of his renal calculi. Patient denies Stent, Ureteroscopy, and PCNL. This procedure was done 01/08/2017. This is not his first kidney stone. He does not have a stent in place.   He is not currently having flank pain, back pain, groin pain, nausea, vomiting, fever or chills.   He does not have dysuria. He does not have urgency. He does not have frequency. This condition would be considered of mild to moderate severity with no modifying factors or associated signs or symptoms other than as noted above.     ALLERGIES: None   MEDICATIONS: Terazosin Hcl 10 mg capsule  Amlodipine Besylate 5 mg tablet  Aspirin 81 MG TABS Oral  Carvedilol 12.5 mg tablet Oral  Gabapentin 300 mg capsule  Mucinex  Prednisone 5  mg tablet, dose pack  Rosuvastatin Calcium 20 mg tablet Oral  Stool Softener  Symbicort 160 mcg-4.5 mcg/actuation hfa aerosol with adapter  Zzzquil     GU PSH: Cysto Bladder Stone <2.5cm - 2015 Cysto Uretero Lithotripsy Cystoscopy TURP - 2015 ESWL - 01/08/2017, Left - 10/23/2016 Repair Bladder Wound - 2015    NON-GU PSH: Appendectomy - 2009 Hernia Repair    GU PMH: Renal and ureteral calculus - 01/29/2017 Acute Cystitis/UTI - 10/12/2016 Ureteral calculus - 10/12/2016, (Acute), Left, Culture urine. Empirically begin Cephalexin 500 mg 1 po BID X 7 days till culture complete. Recommend he proceed with ESWL for treatment of left ureteral stone. Hydrocodone 5/325 mg 1 po Q6 hrs prn., - 09/14/2016 Ureteral obstruction secondary to calculous (Acute), Left, Secondary to mid ureteral calculus - 09/14/2016 BPH w/o LUTS - 10/29/2015, BPH without urinary obstruction, - 04/21/2015 Bulbar urethral stricture - 10/29/2015 Renal calculus - 10/29/2015 Dysuria, Dysuria - 05/18/2015 Urinary Tract Inf, Unspec site, Bacteriuria with pyuria - 05/18/2015, Urinary tract infection, - 2015 Postprocedural bulbous urethral stricture, male, Postprocedural bulbous urethral stricture - 04/21/2015 Urethral Stricture, Unspec, Anterior urethral stricture - 2016 Elevated PSA, Elevated prostate specific antigen (PSA) - 2016 Bladder Stone, Bladder calculus - 2015 BPH w/LUTS, Benign prostatic hyperplasia with urinary obstruction - 2015 Urinary Retention, Unspec, Incomplete bladder emptying - 2015 Chronic prostatitis, Prostatitis, chronic - 2014 ED due to arterial insufficiency, Erectile dysfunction due to arterial insufficiency - 2014 Primary hypogonadism, Hypogonadism, testicular - 2014    NON-GU PMH: Encounter for general adult medical examination without abnormal findings, Encounter for preventive health examination -  05/18/2015 Other malaise, Malaise - 05/18/2015 Unspecified injury of bladder, initial encounter, Bladder  injury - 2015 Personal history of other diseases of the circulatory system, History of hypertension - 2014 Personal history of other endocrine, nutritional and metabolic disease, History of hypercholesterolemia - 2014    FAMILY HISTORY: Family Health Status Number - Father nephrolithiasis - Father   SOCIAL HISTORY: Marital Status: Married Preferred Language: English; Ethnicity: Not Hispanic Or Latino; Race: White Current Smoking Status: Patient does not smoke anymore.  Does not use smokeless tobacco. Has never drank.  Does not use drugs. Drinks 1 caffeinated drink per day. Patient's occupation is/was Retired.    REVIEW OF SYSTEMS:    GU Review Male:   Patient denies frequent urination, hard to postpone urination, burning/ pain with urination, get up at night to urinate, leakage of urine, stream starts and stops, trouble starting your stream, have to strain to urinate , erection problems, and penile pain.  Gastrointestinal (Upper):   Patient denies nausea, vomiting, and indigestion/ heartburn.  Gastrointestinal (Lower):   Patient denies diarrhea and constipation.  Constitutional:   Patient denies fever, night sweats, weight loss, and fatigue.  Skin:   Patient denies skin rash/ lesion and itching.  Eyes:   Patient denies blurred vision and double vision.  Ears/ Nose/ Throat:   Patient denies sore throat and sinus problems.  Hematologic/Lymphatic:   Patient denies swollen glands and easy bruising.  Cardiovascular:   Patient denies leg swelling and chest pains.  Respiratory:   Patient denies shortness of breath and cough.  Endocrine:   Patient denies excessive thirst.  Musculoskeletal:   Patient denies back pain and joint pain.  Neurological:   Patient denies headaches and dizziness.  Psychologic:   Patient denies depression and anxiety.   VITAL SIGNS:      03/23/2017 10:31 AM  Weight 155 lb / 70.31 kg  Height 67 in / 170.18 cm  BP 94/57 mmHg  Pulse 74 /min  Temperature 97.7 F /  36.5 C  BMI 24.3 kg/m   MULTI-SYSTEM PHYSICAL EXAMINATION:    Constitutional: Well-nourished. No physical deformities. Normally developed. Good grooming.  Neck: Neck symmetrical, not swollen. Normal tracheal position.  Respiratory: No labored breathing, no use of accessory muscles. Upper airway congestion noted. BS slightly diminished in bases.  Cardiovascular: Regular rate and rhythm. Normal temperature, normal extremity pulses, no swelling, no varicosities.   Skin: No paleness, no jaundice, no cyanosis. No lesion, no ulcer, no rash.  Neurologic / Psychiatric: Oriented to time, oriented to place, oriented to person. No depression, no anxiety, no agitation.  Gastrointestinal: No mass, no tenderness, no rigidity, non obese abdomen. No CVAT.     PAST DATA REVIEWED:  Source Of History:  Patient  Records Review:   Previous Patient Records  Urine Test Review:   Urinalysis  X-Ray Review: KUB: Reviewed Films. Discussed With Patient.  Renal Ultrasound: Reviewed Films. Discussed With Patient.     10/29/15 06/05/14 05/12/13 02/09/10 11/03/09 04/29/09 10/29/08 10/30/07  PSA  Total PSA 2.03 ng/dl 1.47  7.79  0.83  1.07  1.78  1.00  1.74     02/09/10 11/03/09 04/29/09  Hormones  Testosterone, Total 456.79  194.62  201.0     PROCEDURES:         KUB - 74018  A single view of the abdomen is obtained. Stone fragments remain projected over the left L4-L5 vertebral transverse process. Minimal change from previous imaging study. No other obvious ureteral calculi identified.  Renal Ultrasound - [id number]  Right Kidney: Length: 9.6 cm Depth:4.5 cm Cortical Width: 0.9 cm Width:4.0 cm  Left Kidney: Length: 10.1 cm Depth: 4.2 cm Cortical Width:0.8 cm Width: 3.6 cm  Left Kidney/Ureter:  Cortical thinning noted. ---- Multiple echogenic foci without shadowing with the largest measuring 0.4 cm in the lower pole ( calcifications vs vessels).   Right Kidney/Ureter:  Cortical thinning noted.    Bladder:  PVR = not visualized.       Left kidney appears with some fullness and possible mild hydro compared to the right side.         Urinalysis w/Scope Dipstick Dipstick Cont'd Micro  Color: Yellow Bilirubin: Neg WBC/hpf: 0 - 5/hpf  Appearance: Cloudy Ketones: Neg RBC/hpf: 3 - 10/hpf  Specific Gravity: 1.025 Blood: 2+ Bacteria: Rare (0-9/hpf)  pH: 6.0 Protein: Trace Cystals: Amorph Urates  Glucose: Neg Urobilinogen: 0.2 Casts: NS (Not Seen)    Nitrites: Neg Trichomonas: Not Present    Leukocyte Esterase: Neg Mucous: Present      Epithelial Cells: 0 - 5/hpf      Yeast: NS (Not Seen)      Sperm: Not Present    ASSESSMENT:      ICD-10 Details  1 GU:   Renal and ureteral calculus - N20.2    PLAN:           Orders Labs Urine Culture          Schedule Return Visit/Planned Activity: Other See Visit Notes - Schedule Surgery          Document Letter(s):  Created for Patient: Clinical Summary         Notes:   He still has stone fragments noted within the left proximal ureter. This dates back to a first lithotripsy performed in October of this last year and then repeated again in January. Grossly without symptom but his ultrasound does raise some concern as well as continued microscopic hematuria in the urine. Urinalysis will be sent for culture. Discussed with him likely need for definitive ureteroscopy. This will further confirmed with his urologist. The procedure as well as risks and complications were discussed with the patient. These include but are not limited to infection, injury to the urinary tract i.e. ureteral disruption/evulsion, bleeding, stent discomfort, anesthetic complications, among others.    * Signed by Jiles Crocker on 03/23/17 at 1:22 PM (EDT)*     The information contained in this medical record document is considered private and confidential patient information. This information can only be used for the medical diagnosis and/or medical services that are  being provided by the patient's selected caregivers. This information can only be distributed outside of the patient's care if the patient agrees and signs waivers of authorization for this information to be sent to an outside source or route.  Addendum: Urine culture negative. I discussed the patient was NP Justin Walters and agree with his assessment and plan.

## 2017-04-03 ENCOUNTER — Ambulatory Visit (HOSPITAL_COMMUNITY): Payer: Medicare HMO | Admitting: Anesthesiology

## 2017-04-03 ENCOUNTER — Telehealth (HOSPITAL_COMMUNITY): Payer: Self-pay | Admitting: *Deleted

## 2017-04-03 ENCOUNTER — Other Ambulatory Visit: Payer: Self-pay

## 2017-04-03 ENCOUNTER — Encounter (HOSPITAL_COMMUNITY): Payer: Self-pay | Admitting: Emergency Medicine

## 2017-04-03 ENCOUNTER — Ambulatory Visit (HOSPITAL_COMMUNITY): Payer: Medicare HMO

## 2017-04-03 ENCOUNTER — Encounter (HOSPITAL_COMMUNITY)
Admission: RE | Disposition: A | Discharge status: Home / Self Care | Payer: Self-pay | Source: Ambulatory Visit | Attending: Urology

## 2017-04-03 ENCOUNTER — Ambulatory Visit (HOSPITAL_COMMUNITY)
Admission: RE | Admit: 2017-04-03 | Discharge: 2017-04-03 | Disposition: A | Discharge status: Home / Self Care | Payer: Medicare HMO | Source: Ambulatory Visit | Attending: Urology | Admitting: Urology

## 2017-04-03 DIAGNOSIS — E7800 Pure hypercholesterolemia, unspecified: Secondary | ICD-10-CM | POA: Insufficient documentation

## 2017-04-03 DIAGNOSIS — N201 Calculus of ureter: Secondary | ICD-10-CM | POA: Diagnosis not present

## 2017-04-03 DIAGNOSIS — N200 Calculus of kidney: Secondary | ICD-10-CM | POA: Diagnosis present

## 2017-04-03 DIAGNOSIS — Z87891 Personal history of nicotine dependence: Secondary | ICD-10-CM | POA: Diagnosis not present

## 2017-04-03 DIAGNOSIS — N3289 Other specified disorders of bladder: Secondary | ICD-10-CM | POA: Diagnosis not present

## 2017-04-03 DIAGNOSIS — Z7982 Long term (current) use of aspirin: Secondary | ICD-10-CM | POA: Insufficient documentation

## 2017-04-03 DIAGNOSIS — I10 Essential (primary) hypertension: Secondary | ICD-10-CM | POA: Insufficient documentation

## 2017-04-03 DIAGNOSIS — N368 Other specified disorders of urethra: Secondary | ICD-10-CM | POA: Diagnosis not present

## 2017-04-03 DIAGNOSIS — N189 Chronic kidney disease, unspecified: Secondary | ICD-10-CM | POA: Diagnosis not present

## 2017-04-03 DIAGNOSIS — J449 Chronic obstructive pulmonary disease, unspecified: Secondary | ICD-10-CM | POA: Insufficient documentation

## 2017-04-03 DIAGNOSIS — Z79899 Other long term (current) drug therapy: Secondary | ICD-10-CM | POA: Insufficient documentation

## 2017-04-03 DIAGNOSIS — N362 Urethral caruncle: Secondary | ICD-10-CM | POA: Diagnosis not present

## 2017-04-03 DIAGNOSIS — Z7951 Long term (current) use of inhaled steroids: Secondary | ICD-10-CM | POA: Diagnosis not present

## 2017-04-03 DIAGNOSIS — E785 Hyperlipidemia, unspecified: Secondary | ICD-10-CM | POA: Diagnosis not present

## 2017-04-03 DIAGNOSIS — I129 Hypertensive chronic kidney disease with stage 1 through stage 4 chronic kidney disease, or unspecified chronic kidney disease: Secondary | ICD-10-CM | POA: Diagnosis not present

## 2017-04-03 DIAGNOSIS — N202 Calculus of kidney with calculus of ureter: Secondary | ICD-10-CM | POA: Diagnosis not present

## 2017-04-03 HISTORY — PX: CYSTOSCOPY/URETEROSCOPY/HOLMIUM LASER/STENT PLACEMENT: SHX6546

## 2017-04-03 SURGERY — CYSTOSCOPY/URETEROSCOPY/HOLMIUM LASER/STENT PLACEMENT
Anesthesia: General | Site: Urethra | Laterality: Left

## 2017-04-03 MED ORDER — PHENYLEPHRINE HCL 10 MG/ML IJ SOLN
INTRAMUSCULAR | Status: AC
  Filled 2017-04-03: qty 1

## 2017-04-03 MED ORDER — DEXAMETHASONE SODIUM PHOSPHATE 10 MG/ML IJ SOLN
INTRAMUSCULAR | Status: AC
  Filled 2017-04-03: qty 1

## 2017-04-03 MED ORDER — PHENYLEPHRINE 40 MCG/ML (10ML) SYRINGE FOR IV PUSH (FOR BLOOD PRESSURE SUPPORT)
PREFILLED_SYRINGE | INTRAVENOUS | Status: AC
  Filled 2017-04-03: qty 10

## 2017-04-03 MED ORDER — SODIUM CHLORIDE 0.9 % IR SOLN
Status: DC | PRN
  Administered 2017-04-03: 1000 mL

## 2017-04-03 MED ORDER — LIDOCAINE 2% (20 MG/ML) 5 ML SYRINGE
INTRAMUSCULAR | Status: DC | PRN
  Administered 2017-04-03: 100 mg via INTRAVENOUS

## 2017-04-03 MED ORDER — IOHEXOL 300 MG/ML  SOLN
INTRAMUSCULAR | Status: DC | PRN
  Administered 2017-04-03: 5 mL via INTRAVENOUS

## 2017-04-03 MED ORDER — PROPOFOL 10 MG/ML IV BOLUS
INTRAVENOUS | Status: DC | PRN
  Administered 2017-04-03: 130 mg via INTRAVENOUS

## 2017-04-03 MED ORDER — EPHEDRINE SULFATE-NACL 50-0.9 MG/10ML-% IV SOSY
PREFILLED_SYRINGE | INTRAVENOUS | Status: DC | PRN
  Administered 2017-04-03 (×2): 15 mg via INTRAVENOUS
  Administered 2017-04-03: 10 mg via INTRAVENOUS
  Administered 2017-04-03: 15 mg via INTRAVENOUS
  Administered 2017-04-03: 10 mg via INTRAVENOUS

## 2017-04-03 MED ORDER — FENTANYL CITRATE (PF) 100 MCG/2ML IJ SOLN
INTRAMUSCULAR | Status: AC
  Filled 2017-04-03: qty 2

## 2017-04-03 MED ORDER — SODIUM CHLORIDE 0.9 % IR SOLN
Status: DC | PRN
  Administered 2017-04-03: 3000 mL

## 2017-04-03 MED ORDER — FENTANYL CITRATE (PF) 100 MCG/2ML IJ SOLN
INTRAMUSCULAR | Status: DC | PRN
  Administered 2017-04-03: 25 ug via INTRAVENOUS

## 2017-04-03 MED ORDER — HYDROMORPHONE HCL 1 MG/ML IJ SOLN
0.2500 mg | INTRAMUSCULAR | Status: DC | PRN
  Administered 2017-04-03 (×4): 0.25 mg via INTRAVENOUS

## 2017-04-03 MED ORDER — CEFAZOLIN SODIUM-DEXTROSE 2-4 GM/100ML-% IV SOLN
2.0000 g | Freq: Once | INTRAVENOUS | Status: AC
  Administered 2017-04-03: 2 g via INTRAVENOUS
  Filled 2017-04-03: qty 100

## 2017-04-03 MED ORDER — HYDROMORPHONE HCL 1 MG/ML IJ SOLN
INTRAMUSCULAR | Status: DC
Start: 2017-04-03 — End: 2017-04-03
  Filled 2017-04-03: qty 1

## 2017-04-03 MED ORDER — ONDANSETRON HCL 4 MG/2ML IJ SOLN
INTRAMUSCULAR | Status: DC | PRN
  Administered 2017-04-03: 4 mg via INTRAVENOUS

## 2017-04-03 MED ORDER — EPHEDRINE 5 MG/ML INJ
INTRAVENOUS | Status: AC
  Filled 2017-04-03: qty 10

## 2017-04-03 MED ORDER — SODIUM CHLORIDE 0.9 % IR SOLN
Status: DC | PRN
Start: 2017-04-03 — End: 2017-04-03
  Administered 2017-04-03: 1000 mL

## 2017-04-03 MED ORDER — DEXAMETHASONE SODIUM PHOSPHATE 10 MG/ML IJ SOLN
INTRAMUSCULAR | Status: DC | PRN
  Administered 2017-04-03: 10 mg via INTRAVENOUS

## 2017-04-03 MED ORDER — LIDOCAINE 2% (20 MG/ML) 5 ML SYRINGE
INTRAMUSCULAR | Status: AC
  Filled 2017-04-03: qty 5

## 2017-04-03 MED ORDER — CEPHALEXIN 500 MG PO CAPS: 500.0000 mg | ORAL_CAPSULE | Freq: Every day | ORAL | 0 refills | Status: AC

## 2017-04-03 MED ORDER — PROPOFOL 10 MG/ML IV BOLUS
INTRAVENOUS | Status: AC
  Filled 2017-04-03: qty 20

## 2017-04-03 MED ORDER — ONDANSETRON HCL 4 MG/2ML IJ SOLN
INTRAMUSCULAR | Status: AC
  Filled 2017-04-03: qty 2

## 2017-04-03 MED ORDER — LACTATED RINGERS IV SOLN
INTRAVENOUS | Status: DC
  Administered 2017-04-03 (×2): via INTRAVENOUS

## 2017-04-03 SURGICAL SUPPLY — 20 items
BAG URO CATCHER STRL LF (MISCELLANEOUS) ×2 IMPLANT
BASKET ZERO TIP NITINOL 2.4FR (BASKET) ×2 IMPLANT
BSKT STON RTRVL ZERO TP 2.4FR (BASKET) ×1
CATH INTERMIT  6FR 70CM (CATHETERS) ×2 IMPLANT
CATH URET 5FR 28IN CONE TIP (BALLOONS)
CATH URET 5FR 70CM CONE TIP (BALLOONS) ×1 IMPLANT
CATH URET WHISTLE 6FR (CATHETERS) ×1 IMPLANT
CLOTH BEACON ORANGE TIMEOUT ST (SAFETY) ×2 IMPLANT
COVER FOOTSWITCH UNIV (MISCELLANEOUS) IMPLANT
COVER SURGICAL LIGHT HANDLE (MISCELLANEOUS) ×1 IMPLANT
FIBER LASER TRAC TIP (UROLOGICAL SUPPLIES) ×1 IMPLANT
GLOVE BIO SURGEON STRL SZ7.5 (GLOVE) ×2 IMPLANT
GOWN STRL REUS W/TWL XL LVL3 (GOWN DISPOSABLE) ×2 IMPLANT
GUIDEWIRE STR DUAL SENSOR (WIRE) ×2 IMPLANT
MANIFOLD NEPTUNE II (INSTRUMENTS) ×2 IMPLANT
PACK CYSTO (CUSTOM PROCEDURE TRAY) ×2 IMPLANT
SHEATH ACCESS URETERAL 24CM (SHEATH) ×1 IMPLANT
SHEATH URETERAL 12FRX35CM (MISCELLANEOUS) ×1 IMPLANT
TUBING CONNECTING 10 (TUBING) ×1 IMPLANT
WIRE COONS/BENSON .038X145CM (WIRE) ×1 IMPLANT

## 2017-04-03 NOTE — Op Note (Signed)
Preoperative diagnosis: Left ureteral stone Postoperative diagnosis: Left ureteral stones  Procedure: Cystoscopy, urethral biopsy and fulguration; left retrograde pyelogram, left ureteroscopy with holmium laser lithotripsy, stone basket extraction and left ureteral stent placement, staged; left ureteral biopsy   Surgeon: Junious Silk  Anesthesia: General  Indication for procedure: 77 year old with a stone in the left mid to proximal ureter that will not pass after shockwave x2.  He was brought for definitive stone management today.  Findings: On cystoscopy there was a smooth polyp in the urethra at the junction of the bulb urethra and pendulous urethra with some narrowing here.  More proximally the urethra was narrowed and the scope dilated.  The urethral polyp was biopsied.  The prostate appeared patent, there were no stones or foreign bodies in the bladder.  Ureteral orifice ease in the normal orthotopic position.  Left retrograde pyelogram-this outlined a single ureter single collecting system unit with about 4 filling defects in the left proximal ureter consistent with the stone and stone fragments.  Proximal to this there was mild dilation of the ureter and collecting system.  On ureteroscopy for stone and stone fragments were located in the left proximal ureter and cleared in their entirety.  The left mid ureter contained to smooth small cystic-appearing nodules consistent with ureteritis cystica and these were biopsied but no tissue was noted.  The biopsy sites otherwise look normal with no other worrisome findings.  Description of procedure: After consent was obtained the patient brought to the operating room.  After adequate anesthesia he was placed in lithotomy position and prepped and draped in the usual sterile fashion.  A timeout was performed to confirm the patient and procedure.  The cystoscope was passed per urethra and a urethral polyp was noted.  This was biopsied with the cold cup  forceps and then lightly fulgurated.  This area was narrow but the scope dilated.  More proximal there was a narrowed area which the scope dilated.  The bladder was inspected. A 6 French open-ended catheter was used to cannulate the  left ureteral orifice and retrograde injection of contrast was performed.  Then advanced a sensor wire to the collecting system.  A semirigid ureteroscope was advanced.  The mucosa was a bit swollen and narrowed around the leading stone/fragment and a 200 m laser was passed and this was fragmented well at 0.3 and 50.  I then treated 2 more stones/fragments and then got to the largest more proximal one and slowed the rate to 8 with an energy of 8 and this broke up.  A 0 tip basket was used to drop the pieces in the bladder.  I was able to inspect up to the left UPJ and no other stone fragments were noted.  The ureter was normal apart from significant edema at the stone impaction site.  There were no other fragments and no ureteral injury.  I drained the bladder.  I repassed the semirigid ureteroscope and I used the ureteroscopic cold cup biopsy forceps and biopsied 1 of the polyps but no sample could be found on the back table.  I then biopsied the other polyp in the ureter and again no sample could be located.  However, the ureteral wall appeared normal, there were no papillary findings, no erythematous areas or patches.  The scope was backed out.  The cystoscope was advanced and the bladder drained and the fragments were removed.  The wire was then backloaded on the cystoscope and a 6 x 26 cm stent was  advanced with a good coil seen in the collecting system and a good coil in the bladder.  I left a string on the stent.  I do not feel like he needed a Foley catheter as the stricture dilation was just on the dorsal surface the remainder of the mucosa appeared normal and the biopsy site was very small.  He was awakened and taken to the recovery room in stable condition.     Complications: None  Blood loss: Minimal  Specimens: #1 urethral polyp biopsy to pathology Number 2 stone fragments office lab  Drains: 6 x 26 cm left ureteral stent with tether  Disposition: Patient stable to PACU

## 2017-04-03 NOTE — Anesthesia Procedure Notes (Signed)
Procedure Name: LMA Insertion Date/Time: 04/03/2017 12:05 PM Performed by: Lind Covert, CRNA Pre-anesthesia Checklist: Patient identified, Emergency Drugs available, Suction available, Patient being monitored and Timeout performed Patient Re-evaluated:Patient Re-evaluated prior to induction Oxygen Delivery Method: Circle system utilized Preoxygenation: Pre-oxygenation with 100% oxygen Induction Type: IV induction LMA: LMA inserted LMA Size: 5.0 Number of attempts: 1 Placement Confirmation: positive ETCO2 and breath sounds checked- equal and bilateral Tube secured with: Tape Dental Injury: Teeth and Oropharynx as per pre-operative assessment

## 2017-04-03 NOTE — Transfer of Care (Signed)
Immediate Anesthesia Transfer of Care Note  Patient: Justin Walters  Procedure(s) Performed: CYSTOSCOPY LEFT RETROGRADE/LEFT /URETEROSCOPY/HOLMIUM LASER/BASKET STONE EXTRACTION/STENT PLACEMENT/ URETHRAL BIOPSY (Left Urethra)  Patient Location: PACU  Anesthesia Type:General  Level of Consciousness: sedated  Airway & Oxygen Therapy: Patient Spontanous Breathing and Patient connected to face mask oxygen  Post-op Assessment: Report given to RN and Post -op Vital signs reviewed and stable  Post vital signs: Reviewed and stable  Last Vitals:  Vitals Value Taken Time  BP    Temp    Pulse 69 04/03/2017  1:23 PM  Resp 14 04/03/2017  1:23 PM  SpO2 100 % 04/03/2017  1:23 PM  Vitals shown include unvalidated device data.  Last Pain:  Vitals:   04/03/17 1130  TempSrc:   PainSc: 0-No pain      Patients Stated Pain Goal: 4 (13/68/59 9234)  Complications: No apparent anesthesia complications

## 2017-04-03 NOTE — Interval H&P Note (Signed)
History and Physical Interval Note:  04/03/2017 11:46 AM  Justin Walters  has presented today for surgery, with the diagnosis of LEFT URETERAL STONES  The various methods of treatment have been discussed with the patient and family. After consideration of risks, benefits and other options for treatment, the patient has consented to  Procedure(s): CYSTOSCOPY RETROGRADE//URETEROSCOPY/HOLMIUM LASER/BASKET STONE EXTRACTION/STENT PLACEMENT (Left) as a surgical intervention .  The patient's history has been reviewed, patient examined, no change in status, stable for surgery.  Shockwave x2 with minimal stone passage.  Since his last KUBs passed no stones.  No fever, no dysuria, no gross hematuria.  No flank pain.  I have reviewed the patient's chart and labs.  Questions were answered to the patient's satisfaction.  We discussed he may still need a staged procedure, pre-stent if there is significant edema or impaction.   [name]

## 2017-04-03 NOTE — Discharge Instructions (Signed)
General Anesthesia, Adult, Care After These instructions provide you with information about caring for yourself after your procedure. Your health care provider may also give you more specific instructions. Your treatment has been planned according to current medical practices, but problems sometimes occur. Call your health care provider if you have any problems or questions after your procedure. What can I expect after the procedure? After the procedure, it is common to have: Vomiting. A sore throat. Mental slowness.  It is common to feel: Nauseous. Cold or shivery. Sleepy. Tired. Sore or achy, even in parts of your body where you did not have surgery.  Follow these instructions at home: For at least 24 hours after the procedure: Do not: Participate in activities where you could fall or become injured. Drive. Use heavy machinery. Drink alcohol. Take sleeping pills or medicines that cause drowsiness. Make important decisions or sign legal documents. Take care of children on your own. Rest. Eating and drinking If you vomit, drink water, juice, or soup when you can drink without vomiting. Drink enough fluid to keep your urine clear or pale yellow. Make sure you have little or no nausea before eating solid foods. Follow the diet recommended by your health care provider. General instructions Have a responsible adult stay with you until you are awake and alert. Return to your normal activities as told by your health care provider. Ask your health care provider what activities are safe for you. Take over-the-counter and prescription medicines only as told by your health care provider. If you smoke, do not smoke without supervision. Keep all follow-up visits as told by your health care provider. This is important. Contact a health care provider if: You continue to have nausea or vomiting at home, and medicines are not helpful. You cannot drink fluids or start eating again. You cannot  urinate after 8-12 hours. You develop a skin rash. You have fever. You have increasing redness at the site of your procedure. Get help right away if: You have difficulty breathing. You have chest pain. You have unexpected bleeding. You feel that you are having a life-threatening or urgent problem. This information is not intended to replace advice given to you by your health care provider. Make sure you discuss any questions you have with your health care provider. Document Released: 03/27/2000 Document Revised: 05/24/2015 Document Reviewed: 12/03/2014   Elsevier Interactive Patient Education  2018 [employer].        Ureteral Stent Implantation, Care After Refer to this sheet in the next few weeks. These instructions provide you with information about caring for yourself after your procedure. Your health care provider may also give you more specific instructions. Your treatment has been planned according to current medical practices, but problems sometimes occur. Call your health care provider if you have any problems or questions after your procedure.  Remove the stent by pulling the string in 6 days on Monday morning, April 09, 2017 until the entire stent is removed   What can I expect after the procedure? After the procedure, it is common to have:  Nausea.  Mild pain when you urinate. You may feel this pain in your lower back or lower abdomen. Pain should stop within a few minutes after you urinate. This may last for up to 1 week.  A small amount of blood in your urine for several days.  Follow these instructions at home:  Medicines  Take over-the-counter and prescription medicines only as told by your health care provider.  If  you were prescribed an antibiotic medicine, take it as told by your health care provider. Do not stop taking the antibiotic even if you start to feel better.  Do not drive for 24 hours if you received a sedative.  Do not drive or operate heavy  machinery while taking prescription pain medicines. Activity  Return to your normal activities as told by your health care provider. Ask your health care provider what activities are safe for you.  Do not lift anything that is heavier than 10 lb (4.5 kg). Follow this limit for 1 week after your procedure, or for as long as told by your health care provider. General instructions  Watch for any blood in your urine. Call your health care provider if the amount of blood in your urine increases.  If you have a catheter: ? Follow instructions from your health care provider about taking care of your catheter and collection bag. ? Do not take baths, swim, or use a hot tub until your health care provider approves.  Drink enough fluid to keep your urine clear or pale yellow.  Keep all follow-up visits as told by your health care provider. This is important. Contact a health care provider if:  You have pain that gets worse or does not get better with medicine, especially pain when you urinate.  You have difficulty urinating.  You feel nauseous or you vomit repeatedly during a period of more than 2 days after the procedure. Get help right away if:  Your urine is dark red or has blood clots in it.  You are leaking urine (have incontinence).  The end of the stent comes out of your urethra.  You cannot urinate.  You have sudden, sharp, or severe pain in your abdomen or lower back.  You have a fever.   Cystoscopy, Care After Refer to this sheet in the next few weeks. These instructions provide you with information about caring for yourself after your procedure. Your health care provider may also give you more specific instructions. Your treatment has been planned according to current medical practices, but problems sometimes occur. Call your health care provider if you have any problems or questions after your procedure. What can I expect after the procedure? After the procedure, it is  common to have:  Mild pain when you urinate. Pain should stop within a few minutes after you urinate. This may last for up to 1 week.  A small amount of blood in your urine for several days.  Feeling like you need to urinate but producing only a small amount of urine.  Follow these instructions at home:  Medicines  Take over-the-counter and prescription medicines only as told by your health care provider.  If you were prescribed an antibiotic medicine, take it as told by your health care provider. Do not stop taking the antibiotic even if you start to feel better. General instructions   Return to your normal activities as told by your health care provider. Ask your health care provider what activities are safe for you.  Do not drive for 24 hours if you received a sedative.  Watch for any blood in your urine. If the amount of blood in your urine increases, call your health care provider.  Follow instructions from your health care provider about eating or drinking restrictions.  If a tissue sample was removed for testing (biopsy) during your procedure, it is your responsibility to get your test results. Ask your health care provider or the department  performing the test when your results will be ready.  Drink enough fluid to keep your urine clear or pale yellow.  Keep all follow-up visits as told by your health care provider. This is important. Contact a health care provider if:  You have pain that gets worse or does not get better with medicine, especially pain when you urinate.  You have difficulty urinating. Get help right away if:  You have more blood in your urine.  You have blood clots in your urine.  You have abdominal pain.  You have a fever or chills.  You are unable to urinate. This information is not intended to replace advice given to you by your health care provider. Make sure you discuss any questions you have with your health care provider. Document  Released: 07/08/2004 Document Revised: 05/27/2015 Document Reviewed: 11/05/2014 Elsevier Interactive Patient Education  [employer].

## 2017-04-03 NOTE — Anesthesia Postprocedure Evaluation (Signed)
Anesthesia Post Note  Patient: Justin Walters  Procedure(s) Performed: CYSTOSCOPY LEFT RETROGRADE/LEFT /URETEROSCOPY/HOLMIUM LASER/BASKET STONE EXTRACTION/STENT PLACEMENT/ URETHRAL BIOPSY (Left Urethra)     Patient location during evaluation: PACU Anesthesia Type: General Level of consciousness: awake Pain management: pain level controlled Vital Signs Assessment: post-procedure vital signs reviewed and stable Respiratory status: spontaneous breathing Cardiovascular status: stable Anesthetic complications: no    Last Vitals:  Vitals:   04/03/17 1410 04/03/17 1415  BP:  131/88  Pulse: 67 63  Resp: (!) 9 10  Temp:    SpO2: 95% 95%    Last Pain:  Vitals:   04/03/17 1432  TempSrc:   PainSc: 0-No pain                 Aradhana Gin

## 2017-04-03 NOTE — Anesthesia Preprocedure Evaluation (Signed)
Anesthesia Evaluation  Patient identified by MRN, date of birth, ID band Patient awake    Reviewed: Allergy & Precautions, NPO status   Airway Mallampati: II  TM Distance: >3 FB     Dental   Pulmonary COPD, former smoker,    breath sounds clear to auscultation       Cardiovascular hypertension,  Rhythm:Regular Rate:Normal     Neuro/Psych    GI/Hepatic negative GI ROS, Neg liver ROS,   Endo/Other  negative endocrine ROS  Renal/GU Renal disease     Musculoskeletal   Abdominal   Peds  Hematology   Anesthesia Other Findings   Reproductive/Obstetrics                             Anesthesia Physical Anesthesia Plan  ASA: III  Anesthesia Plan: General   Post-op Pain Management:    Induction: Intravenous  PONV Risk Score and Plan: Treatment may vary due to age or medical condition  Airway Management Planned: LMA  Additional Equipment:   Intra-op Plan:   Post-operative Plan: Extubation in OR  Informed Consent: I have reviewed the patients History and Physical, chart, labs and discussed the procedure including the risks, benefits and alternatives for the proposed anesthesia with the patient or authorized representative who has indicated his/her understanding and acceptance.   Dental advisory given  Plan Discussed with: CRNA and Anesthesiologist  Anesthesia Plan Comments:         Anesthesia Quick Evaluation

## 2017-04-04 ENCOUNTER — Encounter (HOSPITAL_COMMUNITY): Payer: Self-pay | Admitting: Urology

## 2017-04-10 DIAGNOSIS — N202 Calculus of kidney with calculus of ureter: Secondary | ICD-10-CM | POA: Diagnosis not present

## 2017-04-23 DIAGNOSIS — N202 Calculus of kidney with calculus of ureter: Secondary | ICD-10-CM | POA: Diagnosis not present

## 2017-05-15 ENCOUNTER — Other Ambulatory Visit: Payer: Self-pay | Admitting: Internal Medicine

## 2017-05-18 ENCOUNTER — Other Ambulatory Visit: Payer: Self-pay | Admitting: Urology

## 2017-05-18 DIAGNOSIS — N200 Calculus of kidney: Secondary | ICD-10-CM

## 2017-05-21 ENCOUNTER — Ambulatory Visit (HOSPITAL_COMMUNITY)
Admission: RE | Admit: 2017-05-21 | Discharge: 2017-05-21 | Disposition: A | Discharge status: Home / Self Care | Payer: Medicare HMO | Source: Ambulatory Visit | Attending: Urology | Admitting: Urology

## 2017-05-21 DIAGNOSIS — N200 Calculus of kidney: Secondary | ICD-10-CM

## 2017-05-21 DIAGNOSIS — Z87442 Personal history of urinary calculi: Secondary | ICD-10-CM | POA: Diagnosis not present

## 2017-05-21 DIAGNOSIS — N2889 Other specified disorders of kidney and ureter: Secondary | ICD-10-CM | POA: Diagnosis not present

## 2017-05-24 ENCOUNTER — Ambulatory Visit: Payer: Medicare HMO | Admitting: Internal Medicine

## 2017-05-24 ENCOUNTER — Encounter: Payer: Self-pay | Admitting: Internal Medicine

## 2017-05-24 VITALS — BP 112/60 | HR 69 | Ht 67.0 in | Wt 164.4 lb

## 2017-05-24 DIAGNOSIS — K219 Gastro-esophageal reflux disease without esophagitis: Secondary | ICD-10-CM | POA: Insufficient documentation

## 2017-05-24 DIAGNOSIS — J449 Chronic obstructive pulmonary disease, unspecified: Secondary | ICD-10-CM | POA: Diagnosis not present

## 2017-05-24 NOTE — Assessment & Plan Note (Addendum)
Quit smoking 1995 PFT's  01/20/2017  FEV1 1.05 (39 % ) ratio 54  p 18 % improvement from saba p ? prior to study with DLCO  50/53  % corrects to 77 % for alv volume  - 01/25/2017   rec increase symb to 160 2bid trial  - alpha one AT screening  01/18/17   MM  Level 190 PFT's  02/23/2017  FEV1 1.17 (44 % ) ratio 54  p 0 % improvement from saba p symb 160 prior to study with DLCO   39 % corrects to 59 % for alv volume  - 02/23/2017  After extensive coaching inhaler device  effectiveness =    90%  With dpi > add tudorza trial > no change and could not afford  - 05/24/2017  After extensive coaching inhaler device  effectiveness =    90%  Pt doing fine on symb 160 s limiting sob or flares so no need to change rx at this point     I reviewed the Fletcher curve with the patient that basically indicates  if you quit smoking when your best day FEV1 is still   preserved enough for desired activities,  it is highly unlikely you will progress to more severe disease and informed the patient there was  no medication on the market that has proven to alter the curve/ its downward trajectory  or the likelihood of progression of their disease(unlike other chronic medical conditions such as atheroclerosis where we do think we can change the natural hx with risk reducing meds)    Therefore stopping smoking and maintaining abstinence are  the most important aspects of care, not choice of inhalers or for that matter, doctors.   Treatment other than smoking cessation  is entirely directed by severity of symptoms and focused also on reducing exacerbations, not attempting to change the natural history of the disease.    symbicort sufficing for now so f/u can be prn    I had an extended discussion with the patient reviewing all relevant studies completed to date and  lasting 15 to 20 minutes of a 25 minute visit    See device teaching which extended face to face time for this visit.  Each maintenance medication was reviewed  in detail including emphasizing most importantly the difference between maintenance and prns and under what circumstances the prns are to be triggered using an action plan format that is not reflected in the computer generated alphabetically organized AVS which I have not found useful in most complex patients, especially with respiratory illnesses  Please see AVS for specific instructions unique to this visit that I personally wrote and verbalized to the the pt in detail and then reviewed with pt  by my nurse highlighting any  changes in therapy recommended at today's visit to their plan of care.

## 2017-05-24 NOTE — Progress Notes (Signed)
Subjective:     Patient ID: Justin Walters, male   DOB: September 19, 1940,     MRN: [id number]    Brief patient profile:  62 yowm quit smoking 1995 with cough/wheeze/ sob and never recovered with documented GOLD III copd 02/23/2017      History of Present Illness  01/25/2017 1st  office visit/ [name]   GOLD III with reversibility  Chief Complaint  Patient presents with  . Pulmonary Consult    Referred by the hospital. He c/o SOB and coughing for the past several wks. His cough is occ prod, but he is unsure of sputum color. He is SOB with exertion and also when he lies down.  He gets winded walking approx 100 yards. He is using his albuterol inhaler 3 x per wk on average.   baseline doe across the street to son's house  nl pace and then onset   x one year gradually worse doe and no better on inhalers but poor baseline hfa/ assoc with cough / sense of chest congestion and has to prop up at 30 degrees now at hs to get comfortable Present doe = MMRC3 = can't walk 100 yards even at a slow pace at a flat grade s stopping due to sob   Easily confused with details of care/ names of meds / not using spiriva/ was better on pred and worse since tapered rec Plan A = Automatic =  symbicort 160 Take 2 puffs first thing in am and then another 2 puffs about 12 hours later.  Work on inhaler technique:  Plan B = Backup Only use your albuterol  as a rescue  Prednisone 10 mg take  4 each am x 2 days,   2 each am x 2 days,  1 each am x 2 days and stop    02/23/2017  f/u ov/[name] re:  GOLD III copd / no 02 Chief Complaint  Patient presents with  . Follow-up    PFT's today.  Breathing has improved some. He is rarely using his albuterol inhaler. He has been feeling fatigued.    better p prednisone then gradually worse  Dyspnea:  MMRC2 = can't walk a nl pace on a flat grade s sob but does fine slow and flat  Cough: none Sleep: ok 30 degrees no am congestion rec  Add tudorza one puff after symbicort in am and in pm  Work  on inhaler technique:      05/24/2017  f/u ov/[name] re:  Copd III/ no 02  Just on maint symb 160 / could not afford tudorza and not sure it helped doe  Chief Complaint  Patient presents with  . Follow-up    Breathing is doing well. He rarely uses his albuterol inhaler. No new co's.   Dyspnea:  MMRC2 = can't walk a nl pace on a flat grade s sob but does fine slow pace pretty much anywhere he wants to go  Cough: none Sleep: ok flat SABA use:  Rare   Hb every hs x sev months / resolves p about an hour, better p sit up / no assoc dysphagia or daytime symptoms/ varies some with how early he eats supper and how much, not sure what type meals make it worse    No obvious day to day or daytime variability or assoc excess/ purulent sputum or mucus plugs or hemoptysis or cp or chest tightness, subjective wheeze or overt sinus symptoms. No unusual exposure hx or h/o childhood pna/ asthma or  knowledge of premature birth.  Sleeping  flat  without nocturnal  or early am exacerbation  of respiratory  c/o's or need for noct saba. Also denies any obvious fluctuation of symptoms with weather or environmental changes or other aggravating or alleviating factors except as outlined above   Current Allergies, Complete Past Medical History, Past Surgical History, Family History, and Social History were reviewed in [employer] record.  ROS  The following are not active complaints unless bolded Hoarseness, sore throat, dysphagia, dental problems, itching, sneezing,  nasal congestion or discharge of excess mucus or purulent secretions, ear ache,   fever, chills, sweats, unintended wt loss or wt gain, classically pleuritic or exertional cp,  orthopnea pnd or arm/hand swelling  or leg swelling, presyncope, palpitations, abdominal pain, anorexia, nausea, vomiting, diarrhea  or change in bowel habits or change in bladder habits, change in stools or change in urine, dysuria, hematuria,  rash,  arthralgias, visual complaints, headache, numbness, weakness or ataxia or problems with walking or coordination,  change in mood or  memory.        Current Meds  Medication Sig  . albuterol (PROVENTIL HFA;VENTOLIN HFA) 108 (90 Base) MCG/ACT inhaler INHALE 2 PUFFS INTO THE LUNGS EVERY 6 (SIX) HOURS AS NEEDED FOR WHEEZING OR SHORTNESS OF BREATH.  Marland Kitchen amLODipine (NORVASC) 5 MG tablet TAKE ONE TABLET BY MOUTH ONCE DAILY (Patient taking differently: TAKE 5 MG BY MOUTH ONCE DAILY)  . aspirin 81 MG EC tablet Take 1 tablet (81 mg total) by mouth daily. Swallow whole.  . budesonide-formoterol (SYMBICORT) 160-4.5 MCG/ACT inhaler Inhale 2 puffs into the lungs 2 (two) times daily.  . carvedilol (COREG) 12.5 MG tablet TAKE 1 TABLET TWICE DAILY WITH MEALS (Patient taking differently: TAKE 12.5 MG BY MOUTH TWICE DAILY WITH MEALS)  . diphenhydrAMINE HCl, Sleep, (ZZZQUIL) 25 MG CAPS Take 50 mg by mouth at bedtime.   . docusate sodium (COLACE) 50 MG capsule Take 50 mg by mouth daily.  Marland Kitchen gabapentin (NEURONTIN) 300 MG capsule TAKE 1 CAPSULE THREE TIMES DAILY (Patient taking differently: Take 300 mg by mouth 3 times daily)  . guaiFENesin (MUCINEX) 600 MG 12 hr tablet Take 1 tablet (600 mg total) by mouth 2 (two) times daily.  . rosuvastatin (CRESTOR) 20 MG tablet TAKE 1 TABLET EVERY DAY (Patient taking differently: Take 20 mg by mouth every dayat bedtime)  . terazosin (HYTRIN) 10 MG capsule TAKE 1 CAPSULE AT BEDTIME (Patient taking differently: Take 10 mg by mouth at bedtime)                 Objective:   Physical Exam    Hoarse amb  wm nad /   05/24/2017        164    01/25/17 150 lb 12.8 oz (68.4 kg)  01/18/17 150 lb (68 kg)  01/08/17 150 lb (68 kg)      Vital signs reviewed - Note on arrival 02 sats  95% on RA             HEENT: upper patial denture/ nl  oropharynx. Nl external ear canals without cough reflex - mild bilateral non-specific turbinate edema     NECK :  without JVD/Nodes/TM/ nl  carotid upstrokes bilaterally   LUNGS: no acc muscle use,  Mild barrel  contour chest wall with bilateral  Distant bs s audible wheeze and  without cough on insp or exp maneuver and mild   Hyperresonant  to  percussion bilaterally  CV:  RRR  no s3 or murmur or increase in P2, and no edema   ABD:  soft and nontender with pos mid insp Hoover's  in the supine position. No bruits or organomegaly appreciated, bowel sounds nl  MS:   Nl gait/  ext warm without deformities, calf tenderness, cyanosis or clubbing No obvious joint restrictions   SKIN: warm and dry without lesions    NEURO:  alert, approp, nl sensorium with  no motor or cerebellar deficits apparent.                  Assessment:

## 2017-05-24 NOTE — Patient Instructions (Signed)
GERD (REFLUX)  is an extremely common cause of respiratory symptoms just like yours , many times with no obvious heartburn at all.    It can be treated with medication, but also with lifestyle changes including elevation of the head of your bed (ideally with 6 inch  bed blocks),  Smoking cessation, avoidance of late meals, excessive alcohol, and avoid fatty foods, chocolate, peppermint, colas, red wine, and acidic juices such as orange juice.  NO MINT OR MENTHOL PRODUCTS SO NO COUGH DROPS   USE SUGARLESS CANDY INSTEAD (Jolley ranchers or Stover's or Life Savers) or even ice chips will also do - the key is to swallow to prevent all throat clearing. NO OIL BASED VITAMINS - use powdered substitutes.    If not better add pepcid ac 20 mg one hour before bed    If you are satisfied with your treatment plan,  let your doctor know and he/she can either refill your medications or you can return here when your prescription runs out.     If in any way you are not 100% satisfied,  please tell us.  If 100% better, tell your friends!  Pulmonary follow up is as needed

## 2017-05-24 NOTE — Assessment & Plan Note (Signed)
Reported hs only  onset spring 2019 onset > rx diet/hob elevation prn pepcid, gi f/u prn

## 2017-07-06 ENCOUNTER — Other Ambulatory Visit (INDEPENDENT_AMBULATORY_CARE_PROVIDER_SITE_OTHER): Payer: Medicare HMO

## 2017-07-06 DIAGNOSIS — E785 Hyperlipidemia, unspecified: Secondary | ICD-10-CM

## 2017-07-06 DIAGNOSIS — Z0000 Encounter for general adult medical examination without abnormal findings: Secondary | ICD-10-CM

## 2017-07-06 DIAGNOSIS — R7302 Impaired glucose tolerance (oral): Secondary | ICD-10-CM

## 2017-07-06 LAB — LIPID PANEL
CHOL/HDL RATIO: 3
Cholesterol: 119 mg/dL (ref 0–200)
HDL: 47.7 mg/dL (ref >39.00)
LDL Cholesterol: 51 mg/dL (ref 0–99)
NONHDL: 71.56
Triglycerides: 103 mg/dL (ref 0.0–149.0)
VLDL: 20.6 mg/dL (ref 0.0–40.0)

## 2017-07-06 LAB — BASIC METABOLIC PANEL
BUN: 23 mg/dL (ref 6–23)
CO2: 29 meq/L (ref 19–32)
Calcium: 9.2 mg/dL (ref 8.4–10.5)
Chloride: 104 mEq/L (ref 96–112)
Creatinine, Ser: 1.59 mg/dL — ABNORMAL HIGH (ref 0.40–1.50)
GFR: 45.17 mL/min — ABNORMAL LOW (ref >60.00)
GLUCOSE: 110 mg/dL — AB (ref 70–99)
Potassium: 4.6 mEq/L (ref 3.5–5.1)
SODIUM: 141 meq/L (ref 135–145)

## 2017-07-06 LAB — HEPATIC FUNCTION PANEL
ALT: 10 U/L (ref 0–53)
AST: 16 U/L (ref 0–37)
Albumin: 4.3 g/dL (ref 3.5–5.2)
Alkaline Phosphatase: 42 U/L (ref 39–117)
BILIRUBIN DIRECT: 0.1 mg/dL (ref 0.0–0.3)
BILIRUBIN TOTAL: 0.4 mg/dL (ref 0.2–1.2)
Total Protein: 6.7 g/dL (ref 6.0–8.3)

## 2017-07-06 LAB — URINALYSIS, ROUTINE W REFLEX MICROSCOPIC
Hgb urine dipstick: NEGATIVE
LEUKOCYTES UA: NEGATIVE
Nitrite: NEGATIVE
SPECIFIC GRAVITY, URINE: 1.025 (ref 1.000–1.030)
Total Protein, Urine: NEGATIVE
Urine Glucose: NEGATIVE
Urobilinogen, UA: 0.2 (ref 0.0–1.0)
pH: 5.5 (ref 5.0–8.0)

## 2017-07-06 LAB — CBC WITH DIFFERENTIAL/PLATELET
Basophils Absolute: 0.1 10*3/uL (ref 0.0–0.1)
Basophils Relative: 1 % (ref 0.0–3.0)
EOS ABS: 0.3 10*3/uL (ref 0.0–0.7)
EOS PCT: 4.6 % (ref 0.0–5.0)
HCT: 41.8 % (ref 39.0–52.0)
HEMOGLOBIN: 14.1 g/dL (ref 13.0–17.0)
LYMPHS ABS: 1.1 10*3/uL (ref 0.7–4.0)
LYMPHS PCT: 19.4 % (ref 12.0–46.0)
MCHC: 33.7 g/dL (ref 30.0–36.0)
MCV: 86.9 fl (ref 78.0–100.0)
MONO ABS: 0.6 10*3/uL (ref 0.1–1.0)
MONOS PCT: 10.4 % (ref 3.0–12.0)
NEUTROS PCT: 64.6 % (ref 43.0–77.0)
Neutro Abs: 3.6 10*3/uL (ref 1.4–7.7)
Platelets: 164 10*3/uL (ref 150.0–400.0)
RBC: 4.81 Mil/uL (ref 4.22–5.81)
RDW: 14 % (ref 11.5–15.5)
WBC: 5.6 10*3/uL (ref 4.0–10.5)

## 2017-07-06 LAB — TSH: TSH: 3.28 u[IU]/mL (ref 0.35–4.50)

## 2017-07-06 LAB — PSA: PSA: 1.42 ng/mL (ref 0.10–4.00)

## 2017-07-06 LAB — HEMOGLOBIN A1C: HEMOGLOBIN A1C: 5.9 % (ref 4.6–6.5)

## 2017-07-18 ENCOUNTER — Ambulatory Visit: Payer: Medicare HMO | Admitting: Internal Medicine

## 2017-07-25 ENCOUNTER — Ambulatory Visit (INDEPENDENT_AMBULATORY_CARE_PROVIDER_SITE_OTHER): Payer: Medicare HMO | Admitting: Internal Medicine

## 2017-07-25 ENCOUNTER — Encounter: Payer: Self-pay | Admitting: Internal Medicine

## 2017-07-25 VITALS — BP 116/68 | HR 64 | Temp 97.8°F | Ht 67.0 in | Wt 163.0 lb

## 2017-07-25 DIAGNOSIS — N189 Chronic kidney disease, unspecified: Secondary | ICD-10-CM

## 2017-07-25 DIAGNOSIS — Z0000 Encounter for general adult medical examination without abnormal findings: Secondary | ICD-10-CM

## 2017-07-25 DIAGNOSIS — R7302 Impaired glucose tolerance (oral): Secondary | ICD-10-CM | POA: Diagnosis not present

## 2017-07-25 DIAGNOSIS — I10 Essential (primary) hypertension: Secondary | ICD-10-CM

## 2017-07-25 MED ORDER — ZOSTER VAC RECOMB ADJUVANTED 50 MCG/0.5ML IM SUSR: 0.5000 mL | Freq: Once | INTRAMUSCULAR | 1 refills | Status: AC

## 2017-07-25 NOTE — Progress Notes (Signed)
Subjective:    Patient ID: Justin Walters, male    DOB: 31-May-1940, 77 y.o.   MRN: [id number]  HPI  Here for wellness and f/u;  Overall doing ok;  Pt denies Chest pain, worsening SOB, DOE, wheezing, orthopnea, PND, worsening LE edema, palpitations, dizziness or syncope.  Pt denies neurological change such as new headache, facial or extremity weakness.  Pt denies polydipsia, polyuria, or low sugar symptoms. Pt states overall good compliance with treatment and medications, good tolerability, and has been trying to follow appropriate diet.  Pt denies worsening depressive symptoms, suicidal ideation or panic. No fever, night sweats, wt loss, loss of appetite, or other constitutional symptoms.  Pt states good ability with ADL's, has low fall risk, home safety reviewed and adequate, no other significant changes in hearing or vision, and only occasionally active with exercise.  Has seen urology and pulmonary on regular basis, still has some stable doe, and chronic mild dysuria in the setting of prior UTI and renal stones s/p lithotrypsy, last renal u/s may 2019 neg for stone or obstruction  No new complaints Past Medical History:  Diagnosis Date  . At risk for sleep apnea    STOP-BANG= 5    SENT TO PCP 06-13-2013  . Bladder stone   . BPH (benign prostatic hyperplasia)   . COPD (chronic obstructive pulmonary disease) ([place])   . Diverticulosis of colon   . Dysuria   . Glucose intolerance (impaired glucose tolerance)   . History of kidney stones   . Hyperlipidemia   . Hypertension   . Ureteral disorder 2018   ureteral repair   Past Surgical History:  Procedure Laterality Date  . APPENDECTOMY  1980's  . CATARACT EXTRACTION W/ INTRAOCULAR LENS  IMPLANT, BILATERAL    . COLONOSCOPY  10-05-2003   tics only   . CYSTOSCOPY N/A 06/23/2013   Procedure: CYSTOSCOPY WITH LITHOLAPAXY, BLADDER EXPLORATION, REPAIR BLADDER PERFORATION;  Surgeon: Arvil Persons, MD;  Location: [facility];  Service:  Urology;  Laterality: N/A;  . CYSTOSCOPY/URETEROSCOPY/HOLMIUM LASER/STENT PLACEMENT Left 04/03/2017   Procedure: CYSTOSCOPY LEFT RETROGRADE/LEFT /URETEROSCOPY/HOLMIUM LASER/BASKET STONE EXTRACTION/STENT PLACEMENT/ URETHRAL BIOPSY;  Surgeon: [name], MD;  Location: [facility];  Service: Urology;  Laterality: Left;  . EXTRACORPOREAL SHOCK WAVE LITHOTRIPSY Left 10/23/2016   Procedure: LEFT EXTRACORPOREAL SHOCK WAVE LITHOTRIPSY (ESWL);  Surgeon: Nickie Retort, MD;  Location: [facility];  Service: Urology;  Laterality: Left;  . EXTRACORPOREAL SHOCK WAVE LITHOTRIPSY Left 01/08/2017   Procedure: LEFT EXTRACORPOREAL SHOCK WAVE LITHOTRIPSY (ESWL);  Surgeon: [name], MD;  Location: [facility];  Service: Urology;  Laterality: Left;  . HOLMIUM LASER APPLICATION N/A 02/01/8655   Procedure: HOLMIUM LASER APPLICATION;  Surgeon: Arvil Persons, MD;  Location: [facility];  Service: Urology;  Laterality: N/A;  . LAPAROSCOPIC INGUINAL HERNIA REPAIR Bilateral 10-09-2003  . TRANSURETHRAL RESECTION OF PROSTATE N/A 06/23/2013   Procedure: TRANSURETHRAL RESECTION OF THE PROSTATE;  Surgeon: Arvil Persons, MD;  Location: [facility];  Service: Urology;  Laterality: N/A;    reports that he quit smoking about 24 years ago. His smoking use included cigarettes. He has a 102.00 pack-year smoking history. He has never used smokeless tobacco. He reports that he does not drink alcohol or use drugs. family history includes COPD in his mother; Cancer in his father; Emphysema in his brother; Heart failure in his brother. No Known Allergies Current Outpatient Medications on File Prior to Visit  Medication Sig Dispense Refill  .  albuterol (PROVENTIL HFA;VENTOLIN HFA) 108 (90 Base) MCG/ACT inhaler INHALE 2 PUFFS INTO THE LUNGS EVERY 6 (SIX) HOURS AS NEEDED FOR WHEEZING OR SHORTNESS OF BREATH. 54 g 3  . amLODipine (NORVASC) 5 MG tablet TAKE ONE TABLET BY MOUTH ONCE DAILY (Patient taking differently: TAKE  5 MG BY MOUTH ONCE DAILY) 90 tablet 3  . aspirin 81 MG EC tablet Take 1 tablet (81 mg total) by mouth daily. Swallow whole. 30 tablet 12  . budesonide-formoterol (SYMBICORT) 160-4.5 MCG/ACT inhaler Inhale 2 puffs into the lungs 2 (two) times daily. 1 Inhaler 0  . carvedilol (COREG) 12.5 MG tablet TAKE 1 TABLET TWICE DAILY WITH MEALS (Patient taking differently: TAKE 12.5 MG BY MOUTH TWICE DAILY WITH MEALS) 180 tablet 2  . diphenhydrAMINE HCl, Sleep, (ZZZQUIL) 25 MG CAPS Take 50 mg by mouth at bedtime.     . docusate sodium (COLACE) 50 MG capsule Take 50 mg by mouth daily.    Marland Kitchen gabapentin (NEURONTIN) 300 MG capsule TAKE 1 CAPSULE THREE TIMES DAILY (Patient taking differently: Take 300 mg by mouth 3 times daily) 270 capsule 1  . guaiFENesin (MUCINEX) 600 MG 12 hr tablet Take 1 tablet (600 mg total) by mouth 2 (two) times daily. 14 tablet 0  . rosuvastatin (CRESTOR) 20 MG tablet TAKE 1 TABLET EVERY DAY (Patient taking differently: Take 20 mg by mouth every dayat bedtime) 90 tablet 2  . terazosin (HYTRIN) 10 MG capsule TAKE 1 CAPSULE AT BEDTIME (Patient taking differently: Take 10 mg by mouth at bedtime) 90 capsule 2   No current facility-administered medications on file prior to visit.    Review of Systems Constitutional: Negative for other unusual diaphoresis, sweats, appetite or weight changes HENT: Negative for other worsening hearing loss, ear pain, facial swelling, mouth sores or neck stiffness.   Eyes: Negative for other worsening pain, redness or other visual disturbance.  Respiratory: Negative for other stridor or swelling Cardiovascular: Negative for other palpitations or other chest pain  Gastrointestinal: Negative for worsening diarrhea or loose stools, blood in stool, distention or other pain Genitourinary: Negative for hematuria, flank pain or other change in urine volume.  Musculoskeletal: Negative for myalgias or other joint swelling.  Skin: Negative for other color change, or other  wound or worsening drainage.  Neurological: Negative for other syncope or numbness. Hematological: Negative for other adenopathy or swelling Psychiatric/Behavioral: Negative for hallucinations, other worsening agitation, SI, self-injury, or new decreased concentration All  Other system neg per pt    Objective:   Physical Exam BP 116/68   Pulse 64   Temp 97.8 F (36.6 C) (Oral)   Ht 5\' 7"  (1.702 m)   Wt 163 lb (73.9 kg)   SpO2 95%   BMI 25.53 kg/m  VS noted,  Constitutional: Pt is oriented to person, place, and time. Appears well-developed and well-nourished, in no significant distress and comfortable Head: Normocephalic and atraumatic  Eyes: Conjunctivae and EOM are normal. Pupils are equal, round, and reactive to light Right Ear: External ear normal without discharge Left Ear: External ear normal without discharge Nose: Nose without discharge or deformity Mouth/Throat: Oropharynx is without other ulcerations and moist  Neck: Normal range of motion. Neck supple. No JVD present. No tracheal deviation present or significant neck LA or mass Cardiovascular: Normal rate, regular rhythm, normal heart sounds and intact distal pulses.   Pulmonary/Chest: WOB normal and breath sounds without rales or wheezing  Abdominal: Soft. Bowel sounds are normal. NT. No HSM  Musculoskeletal: Normal range of  motion. Exhibits no edema Lymphadenopathy: Has no other cervical adenopathy.  Neurological: Pt is alert and oriented to person, place, and time. Pt has normal reflexes. No cranial nerve deficit. Motor grossly intact, Gait intact Skin: Skin is warm and dry. No rash noted or new ulcerations Psychiatric:  Has normal mood and affect. Behavior is normal without agitation No other exam findings Lab Results  Component Value Date   WBC 5.6 07/06/2017   HGB 14.1 07/06/2017   HCT 41.8 07/06/2017   PLT 164.0 07/06/2017   GLUCOSE 110 (H) 07/06/2017   CHOL 119 07/06/2017   TRIG 103.0 07/06/2017   HDL  47.70 07/06/2017   LDLDIRECT 161.9 03/28/2012   LDLCALC 51 07/06/2017   ALT 10 07/06/2017   AST 16 07/06/2017   NA 141 07/06/2017   K 4.6 07/06/2017   CL 104 07/06/2017   CREATININE 1.59 (H) 07/06/2017   BUN 23 07/06/2017   CO2 29 07/06/2017   TSH 3.28 07/06/2017   PSA 1.42 07/06/2017   HGBA1C 5.9 07/06/2017       Assessment & Plan:

## 2017-07-25 NOTE — Patient Instructions (Addendum)
Your shingles shot prescription was sent to [facility]  Please continue all other medications as before, and refills have been done if requested.  Please have the pharmacy call with any other refills you may need.  Please continue your efforts at being more active, low cholesterol diet, and weight control.  You are otherwise up to date with prevention measures today.  Please keep your appointments with your specialists as you may have planned  Please return in 1 year for your yearly visit, or sooner if needed, with Lab testing done 3-5 days before

## 2017-07-25 NOTE — Assessment & Plan Note (Signed)
stable overall by history and exam, recent data reviewed with pt, and pt to continue medical treatment as before,  to f/u any worsening symptoms or concerns  

## 2017-07-25 NOTE — Assessment & Plan Note (Signed)
BP Readings from Last 3 Encounters:  07/25/17 116/68  05/24/17 112/60  04/03/17 131/88  stable overall by history and exam, recent data reviewed with pt, and pt to continue medical treatment as before,  to f/u any worsening symptoms or concerns

## 2017-07-25 NOTE — Assessment & Plan Note (Signed)

## 2017-08-06 ENCOUNTER — Other Ambulatory Visit: Payer: Self-pay | Admitting: Internal Medicine

## 2017-08-10 ENCOUNTER — Encounter (HOSPITAL_COMMUNITY): Payer: Self-pay | Admitting: *Deleted

## 2017-08-10 ENCOUNTER — Ambulatory Visit: Payer: Self-pay | Admitting: Internal Medicine

## 2017-08-10 ENCOUNTER — Emergency Department (HOSPITAL_COMMUNITY): Payer: Medicare HMO

## 2017-08-10 ENCOUNTER — Emergency Department (HOSPITAL_COMMUNITY)
Admission: EM | Admit: 2017-08-10 | Discharge: 2017-08-10 | Disposition: A | Discharge status: Home / Self Care | Payer: Medicare HMO | Attending: Emergency Medicine | Admitting: Emergency Medicine

## 2017-08-10 DIAGNOSIS — I129 Hypertensive chronic kidney disease with stage 1 through stage 4 chronic kidney disease, or unspecified chronic kidney disease: Secondary | ICD-10-CM | POA: Insufficient documentation

## 2017-08-10 DIAGNOSIS — N189 Chronic kidney disease, unspecified: Secondary | ICD-10-CM | POA: Insufficient documentation

## 2017-08-10 DIAGNOSIS — J449 Chronic obstructive pulmonary disease, unspecified: Secondary | ICD-10-CM | POA: Insufficient documentation

## 2017-08-10 DIAGNOSIS — Z87891 Personal history of nicotine dependence: Secondary | ICD-10-CM | POA: Insufficient documentation

## 2017-08-10 DIAGNOSIS — Z79899 Other long term (current) drug therapy: Secondary | ICD-10-CM | POA: Insufficient documentation

## 2017-08-10 DIAGNOSIS — Z7982 Long term (current) use of aspirin: Secondary | ICD-10-CM | POA: Insufficient documentation

## 2017-08-10 DIAGNOSIS — R079 Chest pain, unspecified: Secondary | ICD-10-CM | POA: Diagnosis not present

## 2017-08-10 LAB — CBC
HEMATOCRIT: 44.6 % (ref 39.0–52.0)
Hemoglobin: 14.3 g/dL (ref 13.0–17.0)
MCH: 28.7 pg (ref 26.0–34.0)
MCHC: 32.1 g/dL (ref 30.0–36.0)
MCV: 89.6 fL (ref 78.0–100.0)
PLATELETS: 156 10*3/uL (ref 150–400)
RBC: 4.98 MIL/uL (ref 4.22–5.81)
RDW: 13.3 % (ref 11.5–15.5)
WBC: 7.7 10*3/uL (ref 4.0–10.5)

## 2017-08-10 LAB — BASIC METABOLIC PANEL
Anion gap: 10 (ref 5–15)
BUN: 19 mg/dL (ref 8–23)
CHLORIDE: 106 mmol/L (ref 98–111)
CO2: 26 mmol/L (ref 22–32)
CREATININE: 1.49 mg/dL — AB (ref 0.61–1.24)
Calcium: 9.2 mg/dL (ref 8.9–10.3)
GFR calc Af Amer: 51 mL/min — ABNORMAL LOW (ref >60)
GFR, EST NON AFRICAN AMERICAN: 44 mL/min — AB (ref >60)
GLUCOSE: 96 mg/dL (ref 70–99)
POTASSIUM: 4.3 mmol/L (ref 3.5–5.1)
SODIUM: 142 mmol/L (ref 135–145)

## 2017-08-10 LAB — I-STAT TROPONIN, ED: Troponin i, poc: 0 ng/mL (ref 0.00–0.08)

## 2017-08-10 NOTE — Telephone Encounter (Signed)
Pt c/o chest pain over right breast that shoots to the right arm. Pt stated that chest pain began 2 days ago.  Pain is moderate in intensity. Pain was initially constant now it comes and goes.  Pt has a history of high blood pressure, overweight,high cholesterol, former smoker who quit 15 years ago. Pt has COPD and stated that his breathing difficulty is at baseline and has not worsened. Pt thinks it is his heart that is causing his symptoms. Care advice given to pt. Pt advised to have another driver to take him to [facility] ED.  Reason for Disposition . [1] Intermittent  chest pain or "angina" AND [2] increasing in severity or frequency  (Exception: pains lasting a few seconds)  Answer Assessment - Initial Assessment Questions 1. LOCATION: "Where does it hurt?"       Right breast  2. RADIATION: "Does the pain go anywhere else?" (e.g., into neck, jaw, arms, back)     Right arm  When pain was pain the pain would shoot down arm when you touch the arm it hurts  3. ONSET: "When did the chest pain begin?" (Minutes, hours or days)      2 days ago 4. PATTERN "Does the pain come and go, or has it been constant since it started?"  "Does it get worse with exertion?"      Was constant now comes and goes 5. DURATION: "How long does it last" (e.g., seconds, minutes, hours)     Few seconds like its a shooting pain 6. SEVERITY: "How bad is the pain?"  (e.g., Scale 1-10; mild, moderate, or severe)    - MILD (1-3): doesn't interfere with normal activities     - MODERATE (4-7): interferes with normal activities or awakens from sleep    - SEVERE (8-10): excruciating pain, unable to do any normal activities       moderate 7. CARDIAC RISK FACTORS: "Do you have any history of heart problems or risk factors for heart disease?" (e.g., prior heart attack, angina; high blood pressure, diabetes, being overweight, high cholesterol, smoking, or strong family history of heart disease)     High blood pressure,  overweight,high cholesterol, former smoker quit 15 years ago, no family history 8. PULMONARY RISK FACTORS: "Do you have any history of lung disease?"  (e.g., blood clots in lung, asthma, emphysema, birth control pills)     COPD 9. CAUSE: "What do you think is causing the chest pain?"     Thought about his heart  10. OTHER SYMPTOMS: "Do you have any other symptoms?" (e.g., dizziness, nausea, vomiting, sweating, fever, difficulty breathing, cough)       Difficulty breathing but pt stated that he has COPD his breathing is a baseline 11. PREGNANCY: "Is there any chance you are pregnant?" "When was your last menstrual period?"       N/a  Protocols used: CHEST PAIN-A-AH

## 2017-08-10 NOTE — ED Notes (Signed)
EDP at bedside  

## 2017-08-10 NOTE — ED Triage Notes (Signed)
Pt in c/o right sided chest pain with radiation into his right arm for the last few days, denies shortness of breath or n/v, no distress noted

## 2017-08-10 NOTE — ED Notes (Signed)
Pt ambulated to the restroom with steady gait. 

## 2017-08-10 NOTE — ED Provider Notes (Signed)
[facility] EMERGENCY DEPARTMENT Provider Note   CSN: [id number] Arrival date & time: 08/10/17  1884     History   Chief Complaint Chief Complaint  Patient presents with  . Chest Pain    HPI Justin Walters is a 77 y.o. male.  HPI Patient presents with chest pain.  For last couple days he has had sharp pain in his right chest.  Comes and goes.  Will last a couple seconds.  Will go from the right lateral upper chest through his right arm.  States he goes down near his elbow on the medial side of his arm.  He could not make it come on.  It will come and go on its own.no known cardiac history.  Has history of COPD and states he has an occasional cough.  States this is unchanged.  Does not feel more short of breath.  No swelling in his legs. Past Medical History:  Diagnosis Date  . At risk for sleep apnea    STOP-BANG= 5    SENT TO PCP 06-13-2013  . Bladder stone   . BPH (benign prostatic hyperplasia)   . COPD (chronic obstructive pulmonary disease) ([place])   . Diverticulosis of colon   . Dysuria   . Glucose intolerance (impaired glucose tolerance)   . History of kidney stones   . Hyperlipidemia   . Hypertension   . Ureteral disorder 2018   ureteral repair    Patient Active Problem List   Diagnosis Date Noted  . GERD (gastroesophageal reflux disease) 05/24/2017  . Exercise hypoxemia 01/26/2017  . Thrush 01/18/2017  . Bilateral hearing loss 07/19/2016  . Dizziness 01/04/2016  . Preop exam for internal medicine 01/04/2016  . Cough 04/07/2015  . COPD exacerbation ([place]) 04/07/2015  . Gastroenteritis 01/16/2015  . Skin soreness 05/15/2014  . Pelvic pain in male 03/28/2012  . Headache 03/28/2012  . Chest pain, unspecified 03/28/2012  . Impaired glucose tolerance 01/14/2011  . Preventative health care 01/14/2011  . Encounter for long-term (current) use of other medications 01/09/2011  . BENIGN PROSTATIC HYPERTROPHY 01/08/2009  . ERECTILE DYSFUNCTION  04/07/2008  . CKD (chronic kidney disease) 10/03/2007  . SKIN LESION 10/03/2007  . PSA, INCREASED 10/03/2007  . Dysuria 07/26/2007  . DIVERTICULOSIS, COLON 02/15/2007  . Hyperlipidemia 09/23/2006  . Essential hypertension 09/23/2006  . RENAL CALCULUS, HX OF 09/23/2006  . COPD GOLD III with reversiblity  08/01/2006    Past Surgical History:  Procedure Laterality Date  . APPENDECTOMY  1980's  . CATARACT EXTRACTION W/ INTRAOCULAR LENS  IMPLANT, BILATERAL    . COLONOSCOPY  10-05-2003   tics only   . CYSTOSCOPY N/A 06/23/2013   Procedure: CYSTOSCOPY WITH LITHOLAPAXY, BLADDER EXPLORATION, REPAIR BLADDER PERFORATION;  Surgeon: Arvil Persons, MD;  Location: [facility];  Service: Urology;  Laterality: N/A;  . CYSTOSCOPY/URETEROSCOPY/HOLMIUM LASER/STENT PLACEMENT Left 04/03/2017   Procedure: CYSTOSCOPY LEFT RETROGRADE/LEFT /URETEROSCOPY/HOLMIUM LASER/BASKET STONE EXTRACTION/STENT PLACEMENT/ URETHRAL BIOPSY;  Surgeon: [name], MD;  Location: [facility];  Service: Urology;  Laterality: Left;  . EXTRACORPOREAL SHOCK WAVE LITHOTRIPSY Left 10/23/2016   Procedure: LEFT EXTRACORPOREAL SHOCK WAVE LITHOTRIPSY (ESWL);  Surgeon: Nickie Retort, MD;  Location: [facility];  Service: Urology;  Laterality: Left;  . EXTRACORPOREAL SHOCK WAVE LITHOTRIPSY Left 01/08/2017   Procedure: LEFT EXTRACORPOREAL SHOCK WAVE LITHOTRIPSY (ESWL);  Surgeon: [name], MD;  Location: [facility];  Service: Urology;  Laterality: Left;  . HOLMIUM LASER APPLICATION N/A 1/66/0630   Procedure: HOLMIUM LASER APPLICATION;  Surgeon: Arvil Persons, MD;  Location: [facility];  Service: Urology;  Laterality: N/A;  . LAPAROSCOPIC INGUINAL HERNIA REPAIR Bilateral 10-09-2003  . TRANSURETHRAL RESECTION OF PROSTATE N/A 06/23/2013   Procedure: TRANSURETHRAL RESECTION OF THE PROSTATE;  Surgeon: Arvil Persons, MD;  Location: [facility];  Service: Urology;  Laterality: N/A;        Home Medications     Prior to Admission medications   Medication Sig Start Date End Date Taking? Authorizing Provider  albuterol (PROVENTIL HFA;VENTOLIN HFA) 108 (90 Base) MCG/ACT inhaler INHALE 2 PUFFS INTO THE LUNGS EVERY 6 (SIX) HOURS AS NEEDED FOR WHEEZING OR SHORTNESS OF BREATH. 05/15/17  Yes Biagio Borg, MD  amLODipine (NORVASC) 5 MG tablet TAKE ONE TABLET BY MOUTH ONCE DAILY Patient taking differently: TAKE 5 MG BY MOUTH ONCE DAILY 01/17/17  Yes Biagio Borg, MD  aspirin 81 MG EC tablet Take 1 tablet (81 mg total) by mouth daily. Swallow whole. 01/09/17  Yes [name], MD  budesonide-formoterol [facility]) 160-4.5 MCG/ACT inhaler Inhale 2 puffs into the lungs 2 (two) times daily. 02/23/17  Yes Tanda Rockers, MD  carvedilol (COREG) 12.5 MG tablet TAKE 1 TABLET TWICE DAILY WITH MEALS 08/07/17  Yes Biagio Borg, MD  diphenhydrAMINE HCl, Sleep, (ZZZQUIL) 25 MG CAPS Take 50 mg by mouth at bedtime.    Yes [provider]  docusate sodium (COLACE) 50 MG capsule Take 50 mg by mouth daily.   Yes [provider]  gabapentin (NEURONTIN) 300 MG capsule TAKE 1 CAPSULE THREE TIMES DAILY Patient taking differently: Take 300 mg by mouth 3 (three) times daily.  03/26/17  Yes Biagio Borg, MD  guaiFENesin (MUCINEX) 600 MG 12 hr tablet Take 1 tablet (600 mg total) by mouth 2 (two) times daily. 12/26/16  Yes Shelly Coss, MD  rosuvastatin (CRESTOR) 20 MG tablet TAKE 1 TABLET EVERY DAY Patient taking differently: Take 20 mg by mouth daily.  08/07/17  Yes Biagio Borg, MD  terazosin (HYTRIN) 10 MG capsule TAKE 1 CAPSULE AT BEDTIME Patient taking differently: Take 10 mg by mouth at bedtime 12/05/16  Yes Biagio Borg, MD    Family History Family History  Problem Relation Age of Onset  . COPD Mother   . Cancer Father        unknown  . Emphysema Brother   . Heart failure Brother   . Colon cancer Neg Hx   . Esophageal cancer Neg Hx   . Rectal cancer Neg Hx   . Stomach cancer Neg Hx     Social  History Social History   Tobacco Use  . Smoking status: Former Smoker    Packs/day: 3.00    Years: 34.00    Pack years: 102.00    Types: Cigarettes    Last attempt to quit: 01/02/1993    Years since quitting: 24.6  . Smokeless tobacco: Never Used  Substance Use Topics  . Alcohol use: No    Alcohol/week: 4.0 standard drinks    Types: 4 Standard drinks or equivalent per week    Frequency: Never  . Drug use: No     Allergies   Patient has no known allergies.   Review of Systems Review of Systems  Constitutional: Negative for appetite change.  HENT: Negative for congestion.   Respiratory: Positive for cough. Negative for shortness of breath.   Cardiovascular: Positive for chest pain.  Gastrointestinal: Negative for abdominal pain.  Genitourinary: Negative for flank pain.  Musculoskeletal: Negative for back pain.  Skin: Negative for rash and wound.  Neurological: Negative for weakness.  Hematological: Negative for adenopathy.  Psychiatric/Behavioral: Negative for confusion.     Physical Exam Updated Vital Signs BP 122/77   Pulse 62   Temp 98 F (36.7 C) (Oral)   Resp 12   SpO2 96%   Physical Exam  Constitutional: He appears well-developed.  HENT:  Head: Atraumatic.  Eyes: Pupils are equal, round, and reactive to light.  Neck: Neck supple.  Cardiovascular: Normal rate.  Pulmonary/Chest:  No chest tenderness  Abdominal: Soft.  Musculoskeletal:       Right lower leg: He exhibits no edema.       Left lower leg: He exhibits no edema.  Neurological: He is alert.  Skin: Skin is warm. Capillary refill takes less than 2 seconds.     ED Treatments / Results  Labs (all labs ordered are listed, but only abnormal results are displayed) Labs Reviewed  BASIC METABOLIC PANEL - Abnormal; Notable for the following components:      Result Value   Creatinine, Ser 1.49 (*)    GFR calc non Af Amer 44 (*)    GFR calc Af Amer 51 (*)    All other components within normal  limits  CBC  I-STAT TROPONIN, ED    EKG EKG Interpretation  Date/Time:  Friday August 10 2017 08:49:07 EDT Ventricular Rate:  76 PR Interval:  168 QRS Duration: 74 QT Interval:  372 QTC Calculation: 418 R Axis:   62 Text Interpretation:  Normal sinus rhythm Normal ECG Confirmed by Davonna Belling [phone]) on 08/10/2017 8:59:56 AM   Radiology Dg Chest 2 View  Result Date: 08/10/2017 CLINICAL DATA:  Right-sided chest pain for 3 days. EXAM: CHEST - 2 VIEW COMPARISON:  12/25/2016 FINDINGS: Heart size and pulmonary vascularity are normal. Aortic atherosclerosis. Small focal area of linear atelectasis in the right midzone. No consolidative infiltrates or effusions. No significant bone abnormality. IMPRESSION: Small focal area of atelectasis in the right midzone. Aortic Atherosclerosis (ICD10-I70.0). Electronically Signed   By: Lorriane Shire M.D.   On: 08/10/2017 09:30    Procedures Procedures (including critical care time)  Medications Ordered in ED Medications - No data to display   Initial Impression / Assessment and Plan / ED Course  I have reviewed the triage vital signs and the nursing notes.  Pertinent labs & imaging results that were available during my care of the patient were reviewed by me and considered in my medical decision making (see chart for details).    Patient with brief right-sided chest pain.  Comes and goes.  Can be a little severe at times.  EKG and work-up reassuring.  Doubt cardiac cause.  Doubt pulmonary embolism.  No rash.  Will discharge home  Final Clinical Impressions(s) / ED Diagnoses   Final diagnoses:  Nonspecific chest pain    ED Discharge Orders    None      Davonna Belling, MD 08/10/17 1620

## 2017-08-14 ENCOUNTER — Other Ambulatory Visit: Payer: Medicare HMO

## 2017-08-14 ENCOUNTER — Ambulatory Visit (INDEPENDENT_AMBULATORY_CARE_PROVIDER_SITE_OTHER): Payer: Medicare HMO | Admitting: Internal Medicine

## 2017-08-14 ENCOUNTER — Encounter: Payer: Self-pay | Admitting: Internal Medicine

## 2017-08-14 VITALS — BP 120/74 | HR 68 | Temp 97.8°F | Ht 67.0 in | Wt 162.0 lb

## 2017-08-14 DIAGNOSIS — R079 Chest pain, unspecified: Secondary | ICD-10-CM

## 2017-08-14 DIAGNOSIS — M79601 Pain in right arm: Secondary | ICD-10-CM | POA: Insufficient documentation

## 2017-08-14 DIAGNOSIS — M5481 Occipital neuralgia: Secondary | ICD-10-CM

## 2017-08-14 DIAGNOSIS — J449 Chronic obstructive pulmonary disease, unspecified: Secondary | ICD-10-CM

## 2017-08-14 MED ORDER — GABAPENTIN 300 MG PO CAPS: 600.0000 mg | ORAL_CAPSULE | Freq: Three times a day (TID) | ORAL | 1 refills | Status: DC

## 2017-08-14 MED ORDER — ACETAMINOPHEN-CODEINE 300-30 MG PO TABS: 1.0000 | ORAL_TABLET | Freq: Four times a day (QID) | ORAL | 0 refills | Status: DC | PRN

## 2017-08-14 NOTE — Progress Notes (Signed)
Subjective:    Patient ID: Justin Walters, male    DOB: 01/23/1940, 77 y.o.   MRN: [id number]  HPI  Here to f/u recent ED visit with right side pain assoc with right arm pain tingling as well, mild intermittent without fever, cough, ST, swelling or rash for 1 wk.  Very concerned about PE.  Also with a pain about 5 min to the right occipital and near crown of head without rash or swelling, mod, intermittent, nothing makes better or worse including the 300 bid gabapentin.  Pt denies increased sob or doe, wheezing, orthopnea, PND, increased LE swelling, palpitations, dizziness or syncope.  Pt denies new neurological symptoms such as new headache, or facial or extremity weakness or numbness   Pt denies polydipsia, polyuria Past Medical History:  Diagnosis Date  . At risk for sleep apnea    STOP-BANG= 5    SENT TO PCP 06-13-2013  . Bladder stone   . BPH (benign prostatic hyperplasia)   . COPD (chronic obstructive pulmonary disease) ([place])   . Diverticulosis of colon   . Dysuria   . Glucose intolerance (impaired glucose tolerance)   . History of kidney stones   . Hyperlipidemia   . Hypertension   . Ureteral disorder 2018   ureteral repair   Past Surgical History:  Procedure Laterality Date  . APPENDECTOMY  1980's  . CATARACT EXTRACTION W/ INTRAOCULAR LENS  IMPLANT, BILATERAL    . COLONOSCOPY  10-05-2003   tics only   . CYSTOSCOPY N/A 06/23/2013   Procedure: CYSTOSCOPY WITH LITHOLAPAXY, BLADDER EXPLORATION, REPAIR BLADDER PERFORATION;  Surgeon: Arvil Persons, MD;  Location: [facility];  Service: Urology;  Laterality: N/A;  . CYSTOSCOPY/URETEROSCOPY/HOLMIUM LASER/STENT PLACEMENT Left 04/03/2017   Procedure: CYSTOSCOPY LEFT RETROGRADE/LEFT /URETEROSCOPY/HOLMIUM LASER/BASKET STONE EXTRACTION/STENT PLACEMENT/ URETHRAL BIOPSY;  Surgeon: [name], MD;  Location: [facility];  Service: Urology;  Laterality: Left;  . EXTRACORPOREAL SHOCK WAVE LITHOTRIPSY Left 10/23/2016   Procedure:  LEFT EXTRACORPOREAL SHOCK WAVE LITHOTRIPSY (ESWL);  Surgeon: Nickie Retort, MD;  Location: [facility];  Service: Urology;  Laterality: Left;  . EXTRACORPOREAL SHOCK WAVE LITHOTRIPSY Left 01/08/2017   Procedure: LEFT EXTRACORPOREAL SHOCK WAVE LITHOTRIPSY (ESWL);  Surgeon: [name], MD;  Location: [facility];  Service: Urology;  Laterality: Left;  . HOLMIUM LASER APPLICATION N/A 2/77/4128   Procedure: HOLMIUM LASER APPLICATION;  Surgeon: Arvil Persons, MD;  Location: [facility];  Service: Urology;  Laterality: N/A;  . LAPAROSCOPIC INGUINAL HERNIA REPAIR Bilateral 10-09-2003  . TRANSURETHRAL RESECTION OF PROSTATE N/A 06/23/2013   Procedure: TRANSURETHRAL RESECTION OF THE PROSTATE;  Surgeon: Arvil Persons, MD;  Location: [facility];  Service: Urology;  Laterality: N/A;    reports that he quit smoking about 24 years ago. His smoking use included cigarettes. He has a 102.00 pack-year smoking history. He has never used smokeless tobacco. He reports that he does not drink alcohol or use drugs. family history includes COPD in his mother; Cancer in his father; Emphysema in his brother; Heart failure in his brother. No Known Allergies Current Outpatient Medications on File Prior to Visit  Medication Sig Dispense Refill  . albuterol (PROVENTIL HFA;VENTOLIN HFA) 108 (90 Base) MCG/ACT inhaler INHALE 2 PUFFS INTO THE LUNGS EVERY 6 (SIX) HOURS AS NEEDED FOR WHEEZING OR SHORTNESS OF BREATH. 54 g 3  . amLODipine (NORVASC) 5 MG tablet TAKE ONE TABLET BY MOUTH ONCE DAILY (Patient taking differently: TAKE 5 MG BY MOUTH ONCE DAILY) 90 tablet  3  . aspirin 81 MG EC tablet Take 1 tablet (81 mg total) by mouth daily. Swallow whole. 30 tablet 12  . budesonide-formoterol (SYMBICORT) 160-4.5 MCG/ACT inhaler Inhale 2 puffs into the lungs 2 (two) times daily. 1 Inhaler 0  . carvedilol (COREG) 12.5 MG tablet TAKE 1 TABLET TWICE DAILY WITH MEALS 180 tablet 2  . diphenhydrAMINE HCl, Sleep, (ZZZQUIL)  25 MG CAPS Take 50 mg by mouth at bedtime.     . docusate sodium (COLACE) 50 MG capsule Take 50 mg by mouth daily.    Marland Kitchen guaiFENesin (MUCINEX) 600 MG 12 hr tablet Take 1 tablet (600 mg total) by mouth 2 (two) times daily. 14 tablet 0  . rosuvastatin (CRESTOR) 20 MG tablet TAKE 1 TABLET EVERY DAY (Patient taking differently: Take 20 mg by mouth daily. ) 90 tablet 2  . terazosin (HYTRIN) 10 MG capsule TAKE 1 CAPSULE AT BEDTIME (Patient taking differently: Take 10 mg by mouth at bedtime) 90 capsule 2   No current facility-administered medications on file prior to visit.    Review of Systems  Constitutional: Negative for other unusual diaphoresis or sweats HENT: Negative for ear discharge or swelling Eyes: Negative for other worsening visual disturbances Respiratory: Negative for stridor or other swelling  Gastrointestinal: Negative for worsening distension or other blood Genitourinary: Negative for retention or other urinary change Musculoskeletal: Negative for other MSK pain or swelling Skin: Negative for color change or other new lesions Neurological: Negative for worsening tremors and other numbness  Psychiatric/Behavioral: Negative for worsening agitation or other fatigue All other system neg per pt    Objective:   Physical Exam BP 120/74   Pulse 68   Temp 97.8 F (36.6 C) (Oral)   Ht 5\' 7"  (1.702 m)   Wt 162 lb (73.5 kg)   SpO2 96%   BMI 25.37 kg/m  VS noted,  Constitutional: Pt appears in NAD HENT: Head: NCAT.  Right Ear: External ear normal.  Left Ear: External ear normal.  Eyes: . Pupils are equal, round, and reactive to light. Conjunctivae and EOM are normal Nose: without d/c or deformity Neck: Neck supple. Gross normal ROM Cardiovascular: Normal rate and regular rhythm.   Pulmonary/Chest: Effort normal and breath sounds without rales or wheezing.  Abd:  Soft, NT, ND, + BS, no organomegaly Neurological: Pt is alert. At baseline orientation, motor grossly intact Skin:  Skin is warm. No rashes, other new lesions, no LE edema Psychiatric: Pt behavior is normal without agitation  No other exam findings    Assessment & Plan:

## 2017-08-14 NOTE — Assessment & Plan Note (Signed)
With a dysesthetic quality, suspect neuritic but declines c spine MRI

## 2017-08-14 NOTE — Assessment & Plan Note (Addendum)
Ok for increased gabapentin 600 tid,  to f/u any worsening symptoms or concerns, also ok for limited rx tylenol #3 prn breakthrough

## 2017-08-14 NOTE — Assessment & Plan Note (Addendum)
Suspect MSK, for d dimer per pt request, declines stress test

## 2017-08-14 NOTE — Patient Instructions (Signed)
Please take all new medication as prescribed - the tylenol #3 only if needed, such as at night to sleep  OK to increase the gabapentin to 600 mg three times per day  Please call if you change your mind about the stress test  Please continue all other medications as before, and refills have been done if requested.  Please have the pharmacy call with any other refills you may need.  Please continue your efforts at being more active, low cholesterol diet, and weight control.  You are otherwise up to date with prevention measures today.  Please keep your appointments with your specialists as you may have planned  Please go to the LAB in the Basement (turn left off the elevator) for the tests to be done today - just the D Dimer today  You will be contacted by phone if any changes need to be made immediately.  Otherwise, you will receive a letter about your results with an explanation, but please check with MyChart first.  Please remember to sign up for MyChart if you have not done so, as this will be important to you in the future with finding out test results, communicating by private email, and scheduling acute appointments online when needed.

## 2017-08-14 NOTE — Assessment & Plan Note (Signed)
stable overall by history and exam, and pt to continue medical treatment as before,  to f/u any worsening symptoms or concerns 

## 2017-08-15 ENCOUNTER — Encounter: Payer: Self-pay | Admitting: Internal Medicine

## 2017-08-15 LAB — D-DIMER, QUANTITATIVE: D-Dimer, Quant: 0.55 mcg/mL FEU — ABNORMAL HIGH (ref <0.50)

## 2017-09-11 DIAGNOSIS — I129 Hypertensive chronic kidney disease with stage 1 through stage 4 chronic kidney disease, or unspecified chronic kidney disease: Secondary | ICD-10-CM | POA: Diagnosis not present

## 2017-09-11 DIAGNOSIS — N200 Calculus of kidney: Secondary | ICD-10-CM | POA: Diagnosis not present

## 2017-09-11 DIAGNOSIS — N183 Chronic kidney disease, stage 3 (moderate): Secondary | ICD-10-CM | POA: Diagnosis not present

## 2017-09-11 DIAGNOSIS — Z23 Encounter for immunization: Secondary | ICD-10-CM | POA: Diagnosis not present

## 2017-10-05 ENCOUNTER — Other Ambulatory Visit: Payer: Self-pay | Admitting: Internal Medicine

## 2017-10-31 ENCOUNTER — Ambulatory Visit: Payer: Self-pay | Admitting: *Deleted

## 2017-10-31 ENCOUNTER — Encounter: Payer: Self-pay | Admitting: Pulmonary Disease

## 2017-10-31 ENCOUNTER — Ambulatory Visit: Payer: Medicare HMO | Admitting: Pulmonary Disease

## 2017-10-31 ENCOUNTER — Ambulatory Visit (INDEPENDENT_AMBULATORY_CARE_PROVIDER_SITE_OTHER)
Admission: RE | Admit: 2017-10-31 | Discharge: 2017-10-31 | Disposition: A | Discharge status: Home / Self Care | Payer: Medicare HMO | Source: Ambulatory Visit | Attending: Pulmonary Disease | Admitting: Pulmonary Disease

## 2017-10-31 VITALS — BP 96/60 | HR 83 | Temp 98.3°F | Ht 65.0 in | Wt 165.0 lb

## 2017-10-31 DIAGNOSIS — J441 Chronic obstructive pulmonary disease with (acute) exacerbation: Secondary | ICD-10-CM

## 2017-10-31 DIAGNOSIS — J449 Chronic obstructive pulmonary disease, unspecified: Secondary | ICD-10-CM

## 2017-10-31 DIAGNOSIS — R0789 Other chest pain: Secondary | ICD-10-CM | POA: Diagnosis not present

## 2017-10-31 MED ORDER — LEVALBUTEROL HCL 0.63 MG/3ML IN NEBU
0.6300 mg | INHALATION_SOLUTION | Freq: Once | RESPIRATORY_TRACT | Status: AC
  Administered 2017-10-31: 0.63 mg via RESPIRATORY_TRACT

## 2017-10-31 MED ORDER — METHYLPREDNISOLONE ACETATE 80 MG/ML IJ SUSP
80.0000 mg | Freq: Once | INTRAMUSCULAR | Status: AC
  Administered 2017-10-31: 80 mg via INTRAMUSCULAR

## 2017-10-31 MED ORDER — AZITHROMYCIN 250 MG PO TABS: ORAL_TABLET | ORAL | 0 refills | Status: DC

## 2017-10-31 MED ORDER — BUDESONIDE-FORMOTEROL FUMARATE 160-4.5 MCG/ACT IN AERO: 2.0000 | INHALATION_SPRAY | Freq: Two times a day (BID) | RESPIRATORY_TRACT | 0 refills | Status: DC

## 2017-10-31 MED ORDER — PREDNISONE 10 MG PO TABS: ORAL_TABLET | ORAL | 0 refills | Status: DC

## 2017-10-31 NOTE — Progress Notes (Signed)
@Patient  ID: Justin Walters, male    DOB: 02/23/1940, 77 y.o.   MRN: [id number]  Chief Complaint  Patient presents with  . Acute Visit    sob, for last 3-4 weeks   Referring provider: Biagio Borg, MD  HPI:  77 year old male former smoker followed in our office for gold 3 COPD  PMH: gerd, htn Smoker/ Smoking History: Former smoker.  102 pack years. Maintenance:  Symbicort 160 Pt of: Dr. Melvyn Novas  10/31/2017  - Visit   77 year old male patient presenting today for acute visit.  Patient reporting his had 3 to 4 weeks of increased shortness of breath, dyspnea on exertion, fatigue, cough, chest tightness.  Patient has had to use his rescue inhaler 2 times a day which is an increase from his baseline of maybe once every 2 weeks.  Patient reports that the cough is productive but he swallows his sputum minutes only a small amount of sputum from what he can tell.  Patient is reporting that he no longer can afford Symbicort 160 as it appears that he has had a coverage.  Patient reports that it was over $100 for him to refill his Symbicort.  He is still been able to remain adherent but is requesting samples or a new medication that may be more affordable for him.  Patient would prefer to remain on Symbicort 160.  MMRC - Breathlessness Score 3 - I stop for breath after walking about 100 yards or after a few minutes on level ground (isle at grocery store is 11ft)  Patient is up-to-date with flu vaccine and pneumonia vaccines.    Tests:   FENO:  No results found for: NITRICOXIDE  PFT: PFT Results Latest Ref Rng & Units 02/23/2017 01/18/2017  FVC-Pre L 2.13 1.74  FVC-Predicted Pre % 57 46  FVC-Post L 2.15 1.95  FVC-Predicted Post % 58 52  Pre FEV1/FVC % % 55 51  Post FEV1/FCV % % 54 54  FEV1-Pre L 1.16 0.88  FEV1-Predicted Pre % 43 33  DLCO UNC% % 39 50  DLCO COR %Predicted % 59 77  TLC L 5.83 7.16  TLC % Predicted % 90 111  RV % Predicted % 144 216    Imaging: No results  found.  Chart Review:    Specialty Problems      Pulmonary Problems   COPD GOLD III with reversiblity     Quit smoking 1995 PFT's  01/20/2017  FEV1 1.05 (39 % ) ratio 54  p 18 % improvement from saba p ? prior to study with DLCO  50/53  % corrects to 77 % for alv volume  - 01/25/2017   rec increase symb to 160 2bid trial  - alpha one AT screening  01/18/17   MM  Level 190 PFT's  02/23/2017  FEV1 1.17 (44 % ) ratio 54  p 0 % improvement from saba p symb 160 prior to study with DLCO   39 % corrects to 59 % for alv volume  - 02/23/2017  After extensive coaching inhaler device  effectiveness =    90%  With dpi > add tudorza trial > no change and could not afford  - 05/24/2017  After extensive coaching inhaler device  effectiveness =    90%        COPD exacerbation ([facility])   Cough      No Known Allergies  Immunization History  Administered Date(s) Administered  . Influenza Whole 11/30/2006, 10/03/2007, 12/11/2008, 10/13/2009  . Influenza,  High Dose Seasonal PF 12/30/2015, 11/15/2016, 10/01/2017  . Influenza,inj,Quad PF,6+ Mos 11/17/2014  . Pneumococcal Conjugate-13 05/01/2013  . Pneumococcal Polysaccharide-23 10/03/2007  . Td 10/03/2007  . Zoster 10/03/2007  . Zoster Recombinat (Shingrix) 08/02/2017   Patient is up-to-date with vaccines >>> Patient plans to receive second Shingrix vaccine in a month or 2  Past Medical History:  Diagnosis Date  . At risk for sleep apnea    STOP-BANG= 5    SENT TO PCP 06-13-2013  . Bladder stone   . BPH (benign prostatic hyperplasia)   . COPD (chronic obstructive pulmonary disease) ([place])   . Diverticulosis of colon   . Dysuria   . Glucose intolerance (impaired glucose tolerance)   . History of kidney stones   . Hyperlipidemia   . Hypertension   . Ureteral disorder 2018   ureteral repair    Tobacco History: Social History   Tobacco Use  Smoking Status Former Smoker  . Packs/day: 3.00  . Years: 34.00  . Pack years: 102.00  . Types:  Cigarettes  . Last attempt to quit: 01/02/1993  . Years since quitting: 24.8  Smokeless Tobacco Never Used   Counseling given: Not Answered  Continue to not smoke  Outpatient Encounter Medications as of 10/31/2017  Medication Sig  . albuterol (PROVENTIL HFA;VENTOLIN HFA) 108 (90 Base) MCG/ACT inhaler INHALE 2 PUFFS INTO THE LUNGS EVERY 6 (SIX) HOURS AS NEEDED FOR WHEEZING OR SHORTNESS OF BREATH.  Marland Kitchen amLODipine (NORVASC) 5 MG tablet TAKE ONE TABLET BY MOUTH ONCE DAILY (Patient taking differently: TAKE 5 MG BY MOUTH ONCE DAILY)  . aspirin 81 MG EC tablet Take 1 tablet (81 mg total) by mouth daily. Swallow whole.  . budesonide-formoterol (SYMBICORT) 160-4.5 MCG/ACT inhaler Inhale 2 puffs into the lungs 2 (two) times daily.  . carvedilol (COREG) 12.5 MG tablet TAKE 1 TABLET TWICE DAILY WITH MEALS  . diphenhydrAMINE HCl, Sleep, (ZZZQUIL) 25 MG CAPS Take 50 mg by mouth at bedtime.   . docusate sodium (COLACE) 50 MG capsule Take 50 mg by mouth daily.  Marland Kitchen gabapentin (NEURONTIN) 300 MG capsule TAKE 1 CAPSULE THREE TIMES DAILY  . guaiFENesin (MUCINEX) 600 MG 12 hr tablet Take 1 tablet (600 mg total) by mouth 2 (two) times daily.  . rosuvastatin (CRESTOR) 20 MG tablet TAKE 1 TABLET EVERY DAY (Patient taking differently: Take 20 mg by mouth daily. )  . terazosin (HYTRIN) 10 MG capsule TAKE 1 CAPSULE AT BEDTIME (Patient taking differently: Take 10 mg by mouth at bedtime)  . Acetaminophen-Codeine (TYLENOL/CODEINE #3) 300-30 MG tablet Take 1 tablet by mouth every 6 (six) hours as needed for pain. (Patient not taking: Reported on 10/31/2017)  . azithromycin (ZITHROMAX) 250 MG tablet 500mg  (two tablets) today, then 250mg  (1 tablet) for the next 4 days  . budesonide-formoterol (SYMBICORT) 160-4.5 MCG/ACT inhaler Inhale 2 puffs into the lungs 2 (two) times daily.  . predniSONE (DELTASONE) 10 MG tablet 4 tabs for 2 days, then 3 tabs for 2 days, 2 tabs for 2 days, then 1 tab for 2 days, then stop  . [EXPIRED]  levalbuterol (XOPENEX) nebulizer solution 0.63 mg   . [EXPIRED] methylPREDNISolone acetate (DEPO-MEDROL) injection 80 mg    No facility-administered encounter medications on file as of 10/31/2017.      Review of Systems  Review of Systems  Constitutional: Positive for fatigue. Negative for activity change, chills, fever and unexpected weight change.  HENT: Negative for postnasal drip, rhinorrhea, sinus pressure, sinus pain, sneezing and sore throat.  Eyes: Negative.   Respiratory: Positive for cough (Occasionally productive, unable to assess sputum color as patient swallows sputum), chest tightness, shortness of breath and wheezing.   Cardiovascular: Negative for chest pain and palpitations.  Gastrointestinal: Negative for constipation, diarrhea, nausea and vomiting.  Endocrine: Negative.   Musculoskeletal: Negative.   Skin: Negative.   Neurological: Negative for dizziness and headaches.  Psychiatric/Behavioral: Negative.  Negative for dysphoric mood. The patient is not nervous/anxious.   All other systems reviewed and are negative.    Physical Exam  BP 96/60 (BP Location: Left Arm, Cuff Size: Normal)   Pulse 83   Temp 98.3 F (36.8 C) (Oral)   Ht 5\' 5"  (1.651 m)   Wt 165 lb (74.8 kg)   SpO2 92%   BMI 27.46 kg/m   Wt Readings from Last 5 Encounters:  10/31/17 165 lb (74.8 kg)  08/14/17 162 lb (73.5 kg)  07/25/17 163 lb (73.9 kg)  05/24/17 164 lb 6.4 oz (74.6 kg)  04/03/17 155 lb (70.3 kg)    Physical Exam  Constitutional: He is oriented to person, place, and time and well-developed, well-nourished, and in no distress. No distress.  HENT:  Head: Normocephalic and atraumatic.  Right Ear: Hearing, tympanic membrane, external ear and ear canal normal.  Left Ear: Hearing, tympanic membrane, external ear and ear canal normal.  Nose: Nose normal. Right sinus exhibits no maxillary sinus tenderness and no frontal sinus tenderness. Left sinus exhibits no maxillary sinus  tenderness and no frontal sinus tenderness.  Mouth/Throat: Uvula is midline and oropharynx is clear and moist. No oropharyngeal exudate.  +canals with cerumen bilaterally   Eyes: Pupils are equal, round, and reactive to light.  Neck: Normal range of motion. Neck supple. No JVD present.  Cardiovascular: Normal rate, regular rhythm and normal heart sounds.  Pulmonary/Chest: Effort normal. No accessory muscle usage. No respiratory distress. He has no decreased breath sounds. He has wheezes. He has no rhonchi.  Deep breath elicits cough   Abdominal: Soft. Bowel sounds are normal. There is no tenderness.  Musculoskeletal: Normal range of motion. He exhibits no edema.  Lymphadenopathy:    He has no cervical adenopathy.  Neurological: He is alert and oriented to person, place, and time. Gait normal.  Skin: Skin is warm and dry. He is not diaphoretic. No erythema.  Psychiatric: Mood, memory, affect and judgment normal.  Nursing note and vitals reviewed.     Lab Results:  CBC    Component Value Date/Time   WBC 7.7 08/10/2017 0942   RBC 4.98 08/10/2017 0942   HGB 14.3 08/10/2017 0942   HCT 44.6 08/10/2017 0942   PLT 156 08/10/2017 0942   MCV 89.6 08/10/2017 0942   MCH 28.7 08/10/2017 0942   MCHC 32.1 08/10/2017 0942   RDW 13.3 08/10/2017 0942   LYMPHSABS 1.1 07/06/2017 0958   MONOABS 0.6 07/06/2017 0958   EOSABS 0.3 07/06/2017 0958   BASOSABS 0.1 07/06/2017 0958    BMET    Component Value Date/Time   NA 142 08/10/2017 0942   K 4.3 08/10/2017 0942   CL 106 08/10/2017 0942   CO2 26 08/10/2017 0942   GLUCOSE 96 08/10/2017 0942   GLUCOSE 101 (H) 01/09/2006 0841   BUN 19 08/10/2017 0942   CREATININE 1.49 (H) 08/10/2017 0942   CALCIUM 9.2 08/10/2017 0942   CALCIUM 9.3 01/09/2011 1553   GFRNONAA 44 (L) 08/10/2017 0942   GFRAA 51 (L) 08/10/2017 0942    BNP    Component Value Date/Time  BNP 116.0 (H) 12/25/2016 1425    ProBNP No results found for: PROBNP    Assessment  & Plan:   This is a pleasant 77 year old man completing acute visit with [place] today.  Patient with COPD exacerbation.  Will treat with Depo-Medrol, Xopenex in office, chest x-ray, prednisone taper, Z-Pak.  Patient to follow-up closely with our office in 6 to 8 weeks with Dr. Melvyn Novas.  Samples provided today for Symbicort 160.  COPD exacerbation ([place]) Xopenex in office today  Depo 80  Prednisone 10mg  tablet  >>>4 tabs for 2 days, then 3 tabs for 2 days, 2 tabs for 2 days, then 1 tab for 2 days, then stop >>>take with food  >>>take in the morning  >>>start on 11/01/17   Azithromycin 250mg  tablet  >>>Take 2 tablets (500mg  total) today, and then 1 tablet (250mg ) for the next four days  >>>take with food  >>>can also take probiotic and / or yogurt while on antibiotic   Continue Symbicort 160 >>> 2 puffs in the morning right when you wake up, rinse out your mouth after use, 12 hours later 2 puffs, rinse after use >>> Take this daily, no matter what >>> This is not a rescue inhaler  >>>samples provided today   Only use your albuterol as a rescue medication to be used if you can't catch your breath by resting or doing a relaxed purse lip breathing pattern.  - The less you use it, the better it will work when you need it. - Ok to use up to 2 puffs  every 4 hours if you must but call for immediate appointment if use goes up over your usual need - Don't leave home without it !!  (think of it like the spare tire for your car)   Note your daily symptoms > remember "red flags" for COPD:   >>>Increase in cough >>>increase in sputum production >>>increase in shortness of breath or activity  intolerance.   If you notice these symptoms, please call the office to be seen.   Follow-up in 6 to 8 weeks with Dr. Melvyn Novas    COPD GOLD III with reversiblity   Continue Symbicort 160 >>> 2 puffs in the morning right when you wake up, rinse out your mouth after use, 12 hours later 2 puffs, rinse after  use >>> Take this daily, no matter what >>> This is not a rescue inhaler  >>>samples provided today   Only use your albuterol as a rescue medication to be used if you can't catch your breath by resting or doing a relaxed purse lip breathing pattern.  - The less you use it, the better it will work when you need it. - Ok to use up to 2 puffs  every 4 hours if you must but call for immediate appointment if use goes up over your usual need - Don't leave home without it !!  (think of it like the spare tire for your car)   Note your daily symptoms > remember "red flags" for COPD:   >>>Increase in cough >>>increase in sputum production >>>increase in shortness of breath or activity  intolerance.   If you notice these symptoms, please call the office to be seen.    Follow-up in 6 to 8 weeks with Dr. Uvaldo Rising, NP 10/31/2017

## 2017-10-31 NOTE — Patient Instructions (Addendum)
Xopenex in office today  Depo 80  Prednisone 10mg  tablet  >>>4 tabs for 2 days, then 3 tabs for 2 days, 2 tabs for 2 days, then 1 tab for 2 days, then stop >>>take with food  >>>take in the morning  >>>start on 11/01/17   Azithromycin 250mg  tablet  >>>Take 2 tablets (500mg  total) today, and then 1 tablet (250mg ) for the next four days  >>>take with food  >>>can also take probiotic and / or yogurt while on antibiotic   Continue Symbicort 160 >>> 2 puffs in the morning right when you wake up, rinse out your mouth after use, 12 hours later 2 puffs, rinse after use >>> Take this daily, no matter what >>> This is not a rescue inhaler  >>>samples provided today   Only use your albuterol as a rescue medication to be used if you can't catch your breath by resting or doing a relaxed purse lip breathing pattern.  - The less you use it, the better it will work when you need it. - Ok to use up to 2 puffs  every 4 hours if you must but call for immediate appointment if use goes up over your usual need - Don't leave home without it !!  (think of it like the spare tire for your car)   Note your daily symptoms > remember "red flags" for COPD:   >>>Increase in cough >>>increase in sputum production >>>increase in shortness of breath or activity  intolerance.   If you notice these symptoms, please call the office to be seen.    Follow-up in 6 to 8 weeks with Dr. Melvyn Novas     November/2019 we will be moving! We will no longer be at our [place] location.  Be on the look out for a post card/mailer to let you know we have officially moved.  Our new address and phone number will be:  [address] [place], [place] [address] Telephone number: [phone]  It is flu season:   >>>Remember to be washing your hands regularly, using hand sanitizer, be careful to use around herself with has contact with people who are sick will increase her chances of getting sick yourself. >>> Best ways to  protect herself from the flu: Receive the yearly flu vaccine, practice good hand hygiene washing with soap and also using hand sanitizer when available, eat a nutritious meals, get adequate rest, hydrate appropriately    Please contact the office if your symptoms worsen or you have concerns that you are not improving.   Thank you for choosing [facility] for your healthcare, and for allowing [place] to partner with you on your healthcare journey. I am thankful to be able to provide care to you today.   Wyn Quaker FNP-C

## 2017-10-31 NOTE — Assessment & Plan Note (Signed)
  Continue Symbicort 160 >>> 2 puffs in the morning right when you wake up, rinse out your mouth after use, 12 hours later 2 puffs, rinse after use >>> Take this daily, no matter what >>> This is not a rescue inhaler  >>>samples provided today   Only use your albuterol as a rescue medication to be used if you can't catch your breath by resting or doing a relaxed purse lip breathing pattern.  - The less you use it, the better it will work when you need it. - Ok to use up to 2 puffs  every 4 hours if you must but call for immediate appointment if use goes up over your usual need - Don't leave home without it !!  (think of it like the spare tire for your car)   Note your daily symptoms > remember "red flags" for COPD:   >>>Increase in cough >>>increase in sputum production >>>increase in shortness of breath or activity  intolerance.   If you notice these symptoms, please call the office to be seen.    Follow-up in 6 to 8 weeks with Dr. Melvyn Novas

## 2017-10-31 NOTE — Assessment & Plan Note (Signed)
Xopenex in office today  Depo 80  Prednisone 10mg  tablet  >>>4 tabs for 2 days, then 3 tabs for 2 days, 2 tabs for 2 days, then 1 tab for 2 days, then stop >>>take with food  >>>take in the morning  >>>start on 11/01/17   Azithromycin 250mg  tablet  >>>Take 2 tablets (500mg  total) today, and then 1 tablet (250mg ) for the next four days  >>>take with food  >>>can also take probiotic and / or yogurt while on antibiotic   Continue Symbicort 160 >>> 2 puffs in the morning right when you wake up, rinse out your mouth after use, 12 hours later 2 puffs, rinse after use >>> Take this daily, no matter what >>> This is not a rescue inhaler  >>>samples provided today   Only use your albuterol as a rescue medication to be used if you can't catch your breath by resting or doing a relaxed purse lip breathing pattern.  - The less you use it, the better it will work when you need it. - Ok to use up to 2 puffs  every 4 hours if you must but call for immediate appointment if use goes up over your usual need - Don't leave home without it !!  (think of it like the spare tire for your car)   Note your daily symptoms > remember "red flags" for COPD:   >>>Increase in cough >>>increase in sputum production >>>increase in shortness of breath or activity  intolerance.   If you notice these symptoms, please call the office to be seen.   Follow-up in 6 to 8 weeks with Dr. Melvyn Novas

## 2017-10-31 NOTE — Progress Notes (Signed)
Chest xray results have come back no pneumonia or effusion. There is some scarring or atelectasis on the Right mid lung. We will follow this with a chest xray in 6 weeks at your next office appointment.  Lauren - please place a order for chest xray to be completed prior to patients ov with MW in Dec/2019.   Wyn Quaker FNP

## 2017-10-31 NOTE — Telephone Encounter (Signed)
Pt reports chest tightness, cough, and SOB x 2 weeks. States  "It's my COPD." Chest tightness of entire chest, "Trying to get the mucous up."  States SOB with exertion, exercise only, mild- moderate. States "This happens with my COPD." Speech non- halting during call. Afebrile. During triage pt decided to call Dr. Melvyn Novas whom he sees for COPD.  Instructed pt TN would alert Dr. Jenny Reichmann; to [facility] if any issues seeing Dr. Melvyn Novas. Reason for Disposition . Increase in amount of sputum  Answer Assessment - Initial Assessment Questions 1. ONSET: "When did the cough begin?"      2 weeks ago 2. SEVERITY: "How bad is the cough today?"     Mild-moderate 3. RESPIRATORY DISTRESS: "Describe your breathing."      With exertion, exercise 4. FEVER: "Do you have a fever?" If so, ask: "What is your temperature, how was it measured, and when did it start?"     no 5. SPUTUM: "Describe the color of your sputum" (e.g., clear, white, yellow, green), "Has there been any change recently?"     Just mucous, whitish; "Hard to get up." 6. HEMOPTYSIS: "Are you coughing up any blood?" If so ask: "How much, flecks, streaks, tablespoons, etc.?"     no 7. CARDIAC HISTORY: "Do you have any history of heart disease?" (e.g., heart attack, congestive heart failure)       8. LUNG HISTORY: "Do you have any history of lung disease?"  (e.g., pulmonary embolus, asthma, emphysema/COPD)     COPD 9. OTHER SYMPTOMS: "Do you have any other symptoms? (e.g., runny nose, wheezing, chest pain)    Chest tightness x 2 weeks, entire chest "I have this at times with my COPD."  Protocols used: COUGH - CHRONIC-A-AH

## 2017-10-31 NOTE — Telephone Encounter (Signed)
Noted  

## 2017-11-01 ENCOUNTER — Other Ambulatory Visit: Payer: Self-pay | Admitting: Pulmonary Disease

## 2017-11-01 DIAGNOSIS — J449 Chronic obstructive pulmonary disease, unspecified: Secondary | ICD-10-CM

## 2017-11-09 ENCOUNTER — Telehealth: Payer: Self-pay | Admitting: Pulmonary Disease

## 2017-11-09 MED ORDER — PREDNISONE 10 MG PO TABS: ORAL_TABLET | ORAL | 0 refills | Status: DC

## 2017-11-09 MED ORDER — DOXYCYCLINE HYCLATE 100 MG PO TABS: 100.0000 mg | ORAL_TABLET | Freq: Two times a day (BID) | ORAL | 0 refills | Status: DC

## 2017-11-09 NOTE — Telephone Encounter (Signed)
Called and spoke with Patient.  He  feels that he was starting to get better when he finished his antibiotic and prednisone.  He feels that he is now getting worse.  He is having a productive cough with thick, yellow, and white mucus.  He is having some SHOB, wheezing, with no fever. He does not want to come in for a OV, but would like more antibiotic and prednisone if that is recommended.  Will route to Wyn Quaker, NP to advise

## 2017-11-09 NOTE — Telephone Encounter (Signed)
Started the patient is not feeling better.  Can offer:  Doxycycline >>> 1 100 mg tablet every 12 hours for 5 days >>>take with food  >>>wear sunscreen   Prednisone 10mg  tablet  >>>4 tabs for 2 days, then 3 tabs for 2 days, 2 tabs for 2 days, then 1 tab for 2 days, then stop >>>take with food  >>>take in the morning   Please place the order.  Patient needs to keep follow-up with Dr. Melvyn Novas.  Patient needs to follow-up with an office visit if symptoms are not improving after this regimen.  Wyn Quaker FNP

## 2017-11-09 NOTE — Telephone Encounter (Signed)
Called and spoke with Patient.  Wyn Quaker, NP recommendations given.  Patient stated understanding.  Prescriptions sent to preferred pharmacy.  Nothing further at this time.

## 2017-11-19 ENCOUNTER — Other Ambulatory Visit: Payer: Self-pay | Admitting: Internal Medicine

## 2017-11-21 ENCOUNTER — Ambulatory Visit: Payer: Medicare HMO | Admitting: Internal Medicine

## 2017-11-21 ENCOUNTER — Encounter: Payer: Self-pay | Admitting: Internal Medicine

## 2017-11-21 ENCOUNTER — Ambulatory Visit (INDEPENDENT_AMBULATORY_CARE_PROVIDER_SITE_OTHER)
Admission: RE | Admit: 2017-11-21 | Discharge: 2017-11-21 | Disposition: A | Discharge status: Home / Self Care | Payer: Medicare HMO | Source: Ambulatory Visit | Attending: Pulmonary Disease | Admitting: Pulmonary Disease

## 2017-11-21 VITALS — BP 122/64 | HR 75 | Ht 65.0 in | Wt 157.0 lb

## 2017-11-21 DIAGNOSIS — J449 Chronic obstructive pulmonary disease, unspecified: Secondary | ICD-10-CM

## 2017-11-21 DIAGNOSIS — J441 Chronic obstructive pulmonary disease with (acute) exacerbation: Secondary | ICD-10-CM | POA: Diagnosis not present

## 2017-11-21 DIAGNOSIS — R05 Cough: Secondary | ICD-10-CM

## 2017-11-21 DIAGNOSIS — R058 Other specified cough: Secondary | ICD-10-CM

## 2017-11-21 MED ORDER — PREDNISONE 10 MG PO TABS
ORAL_TABLET | ORAL | 0 refills | Status: DC
Start: 2017-11-21 — End: 2017-12-05

## 2017-11-21 MED ORDER — ACETAMINOPHEN-CODEINE #3 300-30 MG PO TABS: 1.0000 | ORAL_TABLET | ORAL | 0 refills | Status: AC | PRN

## 2017-11-21 NOTE — Progress Notes (Signed)
Subjective:     Patient ID: Justin Walters, male   DOB: [birth year],     MRN: [id number]    Brief patient profile:  67 yowm quit smoking 1995 with cough/wheeze/ sob and never recovered with documented GOLD III copd 02/23/2017      History of Present Illness  01/25/2017 1st  office visit/ Coyt Govoni   GOLD III with reversibility  Chief Complaint  Patient presents with  . Pulmonary Consult    Referred by the hospital. He c/o SOB and coughing for the past several wks. His cough is occ prod, but he is unsure of sputum color. He is SOB with exertion and also when he lies down.  He gets winded walking approx 100 yards. He is using his albuterol inhaler 3 x per wk on average.   baseline doe across the street to son's house  nl pace and then onset   x one year gradually worse doe and no better on inhalers but poor baseline hfa/ assoc with cough / sense of chest congestion and has to prop up at 30 degrees now at hs to get comfortable Present doe = MMRC3 = can't walk 100 yards even at a slow pace at a flat grade s stopping due to sob   Easily confused with details of care/ names of meds / not using spiriva/ was better on pred and worse since tapered rec Plan A = Automatic =  symbicort 160 Take 2 puffs first thing in am and then another 2 puffs about 12 hours later.  Work on inhaler technique:  Plan B = Backup Only use your albuterol  as a rescue  Prednisone 10 mg take  4 each am x 2 days,   2 each am x 2 days,  1 each am x 2 days and stop    02/23/2017  f/u ov/Anita Mcadory re:  GOLD III copd / no 02 Chief Complaint  Patient presents with  . Follow-up    PFT's today.  Breathing has improved some. He is rarely using his albuterol inhaler. He has been feeling fatigued.    better p prednisone then gradually worse  Dyspnea:  MMRC2 = can't walk a nl pace on a flat grade s sob but does fine slow and flat  Cough: none Sleep: ok 30 degrees no am congestion rec  Add tudorza one puff after symbicort in am and in pm  Work  on inhaler technique:      05/24/2017  f/u ov/Emrys Mckamie re:  Copd III/ no 02  Just on maint symb 160 / could not afford tudorza and not sure it helped doe  Chief Complaint  Patient presents with  . Follow-up    Breathing is doing well. He rarely uses his albuterol inhaler. No new co's.   Dyspnea:  MMRC2 = can't walk a nl pace on a flat grade s sob but does fine slow pace pretty much anywhere he wants to go  Cough: none Sleep: ok flat SABA use:  Rare  Hb every hs x sev months / resolves p about an hour, better p sit up / no assoc dysphagia or daytime symptoms/ varies some with how early he eats supper and how much, not sure what type meals make it worse  rec GERD diet  If not better add pepcid ac 20 mg one hour before bed > did not  Do   10/31/17 NP eval for 3-4 weeks worse sob/ cough rec Xopenex in office  Depo 80 Prednisone 10mg  tablet  >>>  4 tabs for 2 days, then 3 tabs for 2 days, 2 tabs for 2 days, then 1 tab for 2 days, then sto Azithromycin 250mg  tablet  >>>Take 2 tablets (500mg  total) today, and then 1 tablet (250mg ) for the next four days      11/21/2017 acute extended ov/Arnav Cregg re: refractory cough x early oct 2019  / no change sob  Chief Complaint  Patient presents with  . Acute Visit    pt c/o sinus congestion, pnd, prod cough with white mucus X1 month.  has had 2 rounds of pred and doxy with little relief.   dyspnea : MMRC2 = can't walk a nl pace on a flat grade s sob but does fine slow and flat= baseline when not coughing  Cough > white not worse in am's/ harsh daytime coughing fts Sleeping flat two  pillow saba  Not at all now  No obvious day to day or daytime variability or assoc   purulent sputum or mucus plugs or hemoptysis or cp or chest tightness, subjective wheeze or overt sinus or hb symptoms.   Sleeping as above without nocturnal  or early am exacerbation  of respiratory  c/o's or need for noct saba. Also denies any obvious fluctuation of symptoms with weather  or environmental changes or other aggravating or alleviating factors except as outlined above   No unusual exposure hx or h/o childhood pna/ asthma or knowledge of premature birth.  Current Allergies, Complete Past Medical History, Past Surgical History, Family History, and Social History were reviewed in [employer] record.  ROS  The following are not active complaints unless bolded Hoarseness, sore throat, dysphagia, dental problems, itching, sneezing,  nasal congestion or discharge of excess mucus or purulent secretions, ear ache,   fever, chills, sweats, unintended wt loss or wt gain, classically pleuritic or exertional cp,  orthopnea pnd or arm/hand swelling  or leg swelling, presyncope, palpitations, abdominal pain, anorexia, nausea, vomiting, diarrhea  or change in bowel habits or change in bladder habits, change in stools or change in urine, dysuria, hematuria,  rash, arthralgias, visual complaints, headache, numbness, weakness or ataxia or problems with walking or coordination,  change in mood or  memory.        Current Meds  Medication Sig  . albuterol (PROVENTIL HFA;VENTOLIN HFA) 108 (90 Base) MCG/ACT inhaler INHALE 2 PUFFS INTO THE LUNGS EVERY 6 (SIX) HOURS AS NEEDED FOR WHEEZING OR SHORTNESS OF BREATH.  Marland Kitchen amLODipine (NORVASC) 5 MG tablet TAKE 1 TABLET EVERY DAY  . aspirin 81 MG EC tablet Take 1 tablet (81 mg total) by mouth daily. Swallow whole.  . budesonide-formoterol (SYMBICORT) 160-4.5 MCG/ACT inhaler Inhale 2 puffs into the lungs 2 (two) times daily.  . carvedilol (COREG) 12.5 MG tablet TAKE 1 TABLET TWICE DAILY WITH MEALS  . diphenhydrAMINE HCl, Sleep, (ZZZQUIL) 25 MG CAPS Take 50 mg by mouth at bedtime.   . docusate sodium (COLACE) 50 MG capsule Take 50 mg by mouth daily.  Marland Kitchen gabapentin (NEURONTIN) 300 MG capsule TAKE 1 CAPSULE THREE TIMES DAILY  . guaiFENesin (MUCINEX) 600 MG 12 hr tablet Take 1 tablet (600 mg total) by mouth 2 (two) times daily.  .      . rosuvastatin (CRESTOR) 20 MG tablet TAKE 1 TABLET EVERY DAY (Patient taking differently: Take 20 mg by mouth daily. )  . terazosin (HYTRIN) 10 MG capsule TAKE 1 CAPSULE AT BEDTIME (Patient taking differently: Take 10 mg by mouth at bedtime)  . [  Acetaminophen-Codeine (TYLENOL/CODEINE #3) 300-30 MG tablet Take 1 tablet by mouth every 6 (six) hours as needed for pain.  .                          Objective:   Physical Exam  Amb wm with harsh upper airway sounding cough   11/21/2017     157  05/24/2017        164    01/25/17 150 lb 12.8 oz (68.4 kg)  01/18/17 150 lb (68 kg)  01/08/17 150 lb (68 kg)       Vital signs reviewed - Note on arrival 02 sats  94% on RA    HEENT: nl dentition / oropharynx. Nl external ear canals without cough reflex -  Mod bilateral non-specific turbinate edema     NECK :  without JVD/Nodes/TM/ nl carotid upstrokes bilaterally   LUNGS: no acc muscle use,  Mod barrel  contour chest wall with bilateral  Distant bs s audible wheeze and  without cough on insp or exp maneuver and mod  Hyperresonant  to  percussion bilaterally     CV:  RRR  no s3 or murmur or increase in P2, and no edema   ABD:  soft and nontender with pos mid  insp Hoover's  in the supine position. No bruits or organomegaly appreciated, bowel sounds nl  MS:   Nl gait/  ext warm without deformities, calf tenderness, cyanosis or clubbing No obvious joint restrictions   SKIN: warm and dry without lesions    NEURO:  alert, approp, nl sensorium with  no motor or cerebellar deficits apparent.              CXR PA and Lateral:   11/21/2017 :    I personally reviewed images and agree with radiology impression as follows:    COPD/emphysema without evidence of active cardiopulmonary disease.    Assessment:

## 2017-11-21 NOTE — Patient Instructions (Addendum)
Plan A = Automatic = symbicort 160 Take 2 puffs first thing in am and then another 2 puffs about 12 hours later.   Whenever your cough flares > try  Try prilosec otc 20mg   Take 30-60 min before first meal of the day and Pepcid ac (famotidine) 20 mg one after hour after supper or at bedtime until cough is completely gone for at least a week without the need for cough suppression     Plan B = Backup Only use your albuterol inhaler as a rescue medication to be used if you can't catch your breath by resting or doing a relaxed purse lip breathing pattern.  - The less you use it, the better it will work when you need it. - Ok to use the inhaler up to 2 puffs  every 4 hours if you must but call for appointment if use goes up over your usual need - Don't leave home without it !!  (think of it like the spare tire for your car)    For cough Mucinex dm 1200 mg every 12 hours and the tylenol #3  Up to every 4 hours if need for persistent irritating coughing    GERD (REFLUX)  is an extremely common cause of respiratory symptoms just like yours , many times with no obvious heartburn at all.    It can be treated with medication, but also with lifestyle changes including elevation of the head of your bed (ideally with 6 inch  bed blocks),  Smoking cessation, avoidance of late meals, excessive alcohol, and avoid fatty foods, chocolate, peppermint, colas, red wine, and acidic juices such as orange juice.  NO MINT OR MENTHOL PRODUCTS SO NO COUGH DROPS  USE SUGARLESS CANDY INSTEAD (Jolley ranchers or Stover's or Life Savers) or even ice chips will also do - the key is to swallow to prevent all throat clearing. NO OIL BASED VITAMINS - use powdered substitutes.     Please schedule a follow up office visit in 4 weeks, sooner if needed  with all medications /inhalers/ solutions in hand so we can verify exactly what you are taking. This includes all medications from all doctors and over the counters

## 2017-11-22 ENCOUNTER — Encounter: Payer: Self-pay | Admitting: Internal Medicine

## 2017-11-22 DIAGNOSIS — R058 Other specified cough: Secondary | ICD-10-CM | POA: Insufficient documentation

## 2017-11-22 DIAGNOSIS — R05 Cough: Secondary | ICD-10-CM | POA: Insufficient documentation

## 2017-11-22 NOTE — Assessment & Plan Note (Addendum)
Upper airway cough syndrome (previously labeled PNDS),  is so named because it's frequently impossible to sort out how much is  CR/sinusitis with freq throat clearing (which can be related to primary GERD)   vs  causing  secondary (" extra esophageal")  GERD from wide swings in gastric pressure that occur with throat clearing, often  promoting self use of mint and menthol lozenges that reduce the lower esophageal sphincter tone and exacerbate the problem further in a cyclical fashion.   These are the same pts (now being labeled as having "irritable larynx syndrome" by some cough centers) who not infrequently have a history of having failed to tolerate ace inhibitors,  dry powder inhalers or biphosphonates or report having atypical/extraesophageal reflux symptoms that don't respond to standard doses of PPI  and are easily confused as having aecopd or asthma flares by even experienced allergists/ pulmonologists (myself included).   Of the three most common causes of  Sub-acute / recurrent or chronic cough, only one (GERD)  can actually contribute to/ trigger  the other two (asthma and post nasal drip syndrome)  and perpetuate the cylce of cough.  While not intuitively obvious, many patients with chronic low grade reflux do not cough until there is a primary insult that disturbs the protective epithelial barrier and exposes sensitive nerve endings.   This is typically viral but can due to PNDS and  either may apply here.     >>>> The point is that once this occurs, it is difficult to eliminate the cycle  using anything but a maximally effective acid suppression regimen at least in the short run, accompanied by an appropriate diet to address non acid GERD and control / eliminate the cough itself for at least 3 days with tylenol #3 if needed and if not effective next add flutter valve.    I had an extended discussion with the patient reviewing all relevant studies completed to date and  lasting 15 to 20  minutes of a 25 minute visit    See device teaching which extended face to face time for this visit.  Each maintenance medication was reviewed in detail including emphasizing most importantly the difference between maintenance and prns and under what circumstances the prns are to be triggered using an action plan format that is not reflected in the computer generated alphabetically organized AVS which I have not found useful in most complex patients, especially with respiratory illnesses  Please see AVS for specific instructions unique to this visit that I personally wrote and verbalized to the the pt in detail and then reviewed with pt  by my nurse highlighting any  changes in therapy recommended at today's visit to their plan of care.

## 2017-11-22 NOTE — Progress Notes (Signed)
Chest x-ray results of come back.  Showing stable COPD and emphysema.  There are no acute changes at this time. Keep follow up with our office.   Wyn Quaker FNP

## 2017-11-22 NOTE — Assessment & Plan Note (Signed)
Doubt this is really aecopd but rx with one more course pred x 8 days

## 2017-11-22 NOTE — Assessment & Plan Note (Addendum)
Quit smoking 1995 PFT's  01/20/2017  FEV1 1.05 (39 % ) ratio 54  p 18 % improvement from saba p ? prior to study with DLCO  50/53  % corrects to 77 % for alv volume  - 01/25/2017   rec increase symb to 160 2bid trial  - alpha one AT screening  01/18/17   MM  Level 190 PFT's  02/23/2017  FEV1 1.17 (44 % ) ratio 54  p 0 % improvement from saba p symb 160 prior to study with DLCO   39 % corrects to 59 % for alv volume  - 02/23/2017    add tudorza trial > no change and could not afford  - 11/21/2017  After extensive coaching inhaler device,  effectiveness =    90%   Persistent flare of cough s much change in doe with mostly upper airway features today is most c/w uacs (see separate a/p) though concerned about use of coreg(relatively non specific BB) in setting of non-specific unexplained refractory resp symptoms in pt with airways dz needing beta agonist rx  - will address on return if not better with present recs   Add max gerd rx when coughing/ reviewed use of saba prn and f/u in 4 weeks with all meds in hand using a trust but verify approach to confirm accurate Medication  Reconciliation The principal here is that until we are certain that the  patients are doing what we've asked, it makes no sense to ask them to do more.

## 2017-12-05 ENCOUNTER — Encounter: Payer: Self-pay | Admitting: Internal Medicine

## 2017-12-05 ENCOUNTER — Ambulatory Visit: Payer: Medicare HMO | Admitting: Internal Medicine

## 2017-12-05 VITALS — BP 94/60 | HR 80 | Temp 97.8°F | Ht 65.0 in | Wt 157.0 lb

## 2017-12-05 DIAGNOSIS — R05 Cough: Secondary | ICD-10-CM | POA: Diagnosis not present

## 2017-12-05 DIAGNOSIS — J441 Chronic obstructive pulmonary disease with (acute) exacerbation: Secondary | ICD-10-CM | POA: Diagnosis not present

## 2017-12-05 MED ORDER — FLUTTER DEVI: 0 refills | Status: AC

## 2017-12-05 MED ORDER — OMEPRAZOLE 20 MG PO CPDR: DELAYED_RELEASE_CAPSULE | ORAL | Status: DC

## 2017-12-05 MED ORDER — ACETAMINOPHEN-CODEINE #3 300-30 MG PO TABS: 1.0000 | ORAL_TABLET | ORAL | 0 refills | Status: DC | PRN

## 2017-12-05 MED ORDER — AMOXICILLIN-POT CLAVULANATE 875-125 MG PO TABS: 1.0000 | ORAL_TABLET | Freq: Two times a day (BID) | ORAL | 0 refills | Status: AC

## 2017-12-05 MED ORDER — PREDNISONE 10 MG PO TABS: ORAL_TABLET | ORAL | 0 refills | Status: DC

## 2017-12-05 NOTE — Patient Instructions (Addendum)
Augmentin 875 mg take one pill twice daily  X 10 days - take at breakfast and supper with large glass of water.  It would help reduce the usual side effects (diarrhea and yeast infections) if you ate cultured yogurt at lunch.   Take prednisone  4 for three days 3 for three days 2 for three days 1 for three days and stop   Omeprazole 20 mg Take 30- 60 min before your first and last meals of the day and continue famotidine 20 mg at bedtime  Bed blocks would be a great idea  Try off zquil and tylenol #3  Up to 2 at bedtime  For cough > mucinex dm 1200 mg every 12 hours and use the flutter valve as much as possible and if still can't stop coughing then use the tylenol #3    Keep previous appt

## 2017-12-05 NOTE — Progress Notes (Signed)
Subjective:     Patient ID: Justin Walters, male   DOB: [birth year],     MRN: [id number]    Brief patient profile:  34 yowm quit smoking 1995 with cough/wheeze/ sob and never recovered with documented GOLD III copd 02/23/2017      History of Present Illness  01/25/2017 1st  office visit/ Justin Walters   GOLD III with reversibility  Chief Complaint  Patient presents with  . Pulmonary Consult    Referred by the hospital. He c/o SOB and coughing for the past several wks. His cough is occ prod, but he is unsure of sputum color. He is SOB with exertion and also when he lies down.  He gets winded walking approx 100 yards. He is using his albuterol inhaler 3 x per wk on average.   baseline doe across the street to son's house  nl pace and then onset   x one year gradually worse doe and no better on inhalers but poor baseline hfa/ assoc with cough / sense of chest congestion and has to prop up at 30 degrees now at hs to get comfortable Present doe = MMRC3 = can't walk 100 yards even at a slow pace at a flat grade s stopping due to sob   Easily confused with details of care/ names of meds / not using spiriva/ was better on pred and worse since tapered rec Plan A = Automatic =  symbicort 160 Take 2 puffs first thing in am and then another 2 puffs about 12 hours later.  Work on inhaler technique:  Plan B = Backup Only use your albuterol  as a rescue  Prednisone 10 mg take  4 each am x 2 days,   2 each am x 2 days,  1 each am x 2 days and stop    02/23/2017  f/u ov/Justin Walters re:  GOLD III copd / no 02 Chief Complaint  Patient presents with  . Follow-up    PFT's today.  Breathing has improved some. He is rarely using his albuterol inhaler. He has been feeling fatigued.    better p prednisone then gradually worse  Dyspnea:  MMRC2 = can't walk a nl pace on a flat grade s sob but does fine slow and flat  Cough: none Sleep: ok 30 degrees no am congestion rec  Add tudorza one puff after symbicort in am and in pm  Work  on inhaler technique:      05/24/2017  f/u ov/Justin Walters re:  Copd III/ no 02  Just on maint symb 160 / could not afford tudorza and not sure it helped doe  Chief Complaint  Patient presents with  . Follow-up    Breathing is doing well. He rarely uses his albuterol inhaler. No new co's.   Dyspnea:  MMRC2 = can't walk a nl pace on a flat grade s sob but does fine slow pace pretty much anywhere he wants to go  Cough: none Sleep: ok flat SABA use:  Rare  Hb every hs x sev months / resolves p about an hour, better p sit up / no assoc dysphagia or daytime symptoms/ varies some with how early he eats supper and how much, not sure what type meals make it worse  rec GERD diet  If not better add pepcid ac 20 mg one hour before bed > did not  Do   10/31/17 NP eval for 3-4 weeks worse sob/ cough rec Xopenex in office  Depo 80 Prednisone 10mg  tablet  >>>  4 tabs for 2 days, then 3 tabs for 2 days, 2 tabs for 2 days, then 1 tab for 2 days, then sto Azithromycin 250mg  tablet  >>>Take 2 tablets (500mg  total) today, and then 1 tablet (250mg ) for the next four days      11/21/2017 acute extended ov/Justin Walters re: refractory cough x early oct 2019  / no change sob  Chief Complaint  Patient presents with  . Acute Visit    pt c/o sinus congestion, pnd, prod cough with white mucus X1 month.  has had 2 rounds of pred and doxy with little relief.   dyspnea : MMRC2 = can't walk a nl pace on a flat grade s sob but does fine slow and flat= baseline when not coughing  Cough > white not worse in am's/ harsh daytime coughing fts Sleeping flat two  pillow saba  Not at all now rec Plan A = Automatic = symbicort 160 Take 2 puffs first thing in am and then another 2 puffs about 12 hours later.  Whenever your cough flares > try  Try prilosec otc 20mg   Take 30-60 min before first meal of the day and Pepcid ac (famotidine) 20 mg one after hour after supper or at bedtime until cough is completely gone for at least a week  without the need for cough suppression Plan B = Backup Only use your albuterol inhaler as a rescue medication  goes up over your usual need - Don't leave home without it !!  (think of it like the spare tire for your car)  For cough Mucinex dm 1200 mg every 12 hours and the tylenol #3  Up to every 4 hours if need for persistent irritating coughing  GERD diet    12/05/2017 acute extended ov/Justin Walters re: cough and sob  Chief Complaint  Patient presents with  . Acute Visit    Increased SOB, cough and chest congestion x 1 month. Sputum is white to yellow. He is using his albuterol inhaler 2 x per wk on average.   cough is worse p supper and fine on back flat bed/ no blocks  In am feels fine but groggy p benadryl - can't up sleep s it W/in 20 min of rising lots of pnds > white mucus  Not using mucinex dm / reversing ppi and h2  Technique on symb poor and not using saba correctly in terms or when to use   No obvious day to day or daytime variability or assoc  purulent sputum or mucus plugs or hemoptysis or cp or chest tightness, subjective wheeze or overt sinus or hb symptoms.   Sleeping ok p benadryl and on tyl#3  without nocturnal  or early am exacerbation  of respiratory  c/o's or need for noct saba. Also denies any obvious fluctuation of symptoms with weather or environmental changes or other aggravating or alleviating factors except as outlined above   No unusual exposure hx or h/o childhood pna/ asthma or knowledge of premature birth.  Current Allergies, Complete Past Medical History, Past Surgical History, Family History, and Social History were reviewed in [facility] record.  ROS  The following are not active complaints unless bolded Hoarseness, sore throat, dysphagia, dental problems, itching, sneezing,  nasal congestion or discharge of excess mucus or purulent secretions, ear ache,   fever, chills, sweats, unintended wt loss or wt gain, classically pleuritic or  exertional cp,  orthopnea pnd or arm/hand swelling  or leg swelling, presyncope, palpitations, abdominal pain, anorexia,  nausea, vomiting, diarrhea  or change in bowel habits or change in bladder habits, change in stools or change in urine, dysuria, hematuria,  rash, arthralgias, visual complaints, headache, numbness, weakness or ataxia or problems with walking or coordination,  change in mood or  memory.        Current Meds  Medication Sig  . albuterol (PROVENTIL HFA;VENTOLIN HFA) 108 (90 Base) MCG/ACT inhaler INHALE 2 PUFFS INTO THE LUNGS EVERY 6 (SIX) HOURS AS NEEDED FOR WHEEZING OR SHORTNESS OF BREATH.  Marland Kitchen amLODipine (NORVASC) 5 MG tablet TAKE 1 TABLET EVERY DAY  . aspirin 81 MG EC tablet Take 1 tablet (81 mg total) by mouth daily. Swallow whole.  . budesonide-formoterol (SYMBICORT) 160-4.5 MCG/ACT inhaler Inhale 2 puffs into the lungs 2 (two) times daily.  . carvedilol (COREG) 12.5 MG tablet TAKE 1 TABLET TWICE DAILY WITH MEALS  . famotidine (PEPCID) 20 MG tablet Take 20 mg by mouth at bedtime.  . gabapentin (NEURONTIN) 300 MG capsule TAKE 1 CAPSULE THREE TIMES DAILY  . Guaifenesin (MUCINEX MAXIMUM STRENGTH) 1200 MG TB12 Take 1 tablet by mouth 2 (two) times daily.  . rosuvastatin (CRESTOR) 20 MG tablet TAKE 1 TABLET EVERY DAY (Patient taking differently: Take 20 mg by mouth daily. )  . terazosin (HYTRIN) 10 MG capsule TAKE 1 CAPSULE AT BEDTIME (Patient taking differently: Take 10 mg by mouth at bedtime)  . [  diphenhydrAMINE HCl, Sleep, (ZZZQUIL) 25 MG CAPS Take 50 mg by mouth at bedtime.   Marland Kitchen   omeprazole (PRILOSEC) 20 MG capsule Take 20 mg by mouth daily.             .                    Objective:   Physical Exam  amb wm congested sounding rattlilng  cough   12/05/2017       157  11/21/2017     157  05/24/2017        164    01/25/17 150 lb 12.8 oz (68.4 kg)  01/18/17 150 lb (68 kg)  01/08/17 150 lb (68 kg)      Vital signs reviewed - Note on arrival 02 sats  93%  on RA         HEENT: nl dentition / oropharynx. Nl external ear canals without cough reflex -  Mod bilateral non-specific turbinate edema     NECK :  without JVD/Nodes/TM/ nl carotid upstrokes bilaterally   LUNGS: no acc muscle use,  Mod barrel  contour chest wall with bilateral  Distant exp  Wheeze on fvc/ better with plm/  and  without cough on insp or exp maneuver and mod  Hyperresonant  to  percussion bilaterally     CV:  RRR  no s3 or murmur or increase in P2, and no edema   ABD:  soft and nontender with pos mid insp Hoover's  in the supine position. No bruits or organomegaly appreciated, bowel sounds nl  MS:   Nl gait/  ext warm without deformities, calf tenderness, cyanosis or clubbing No obvious joint restrictions   SKIN: warm and dry without lesions    NEURO:  alert, approp, nl sensorium with  no motor or cerebellar deficits apparent.             Assessment:

## 2017-12-06 ENCOUNTER — Encounter: Payer: Self-pay | Admitting: Internal Medicine

## 2017-12-06 NOTE — Assessment & Plan Note (Signed)
Quit smoking 1995 PFT's  01/20/2017  FEV1 1.05 (39 % ) ratio 54  p 18 % improvement from saba p ? prior to study with DLCO  50/53  % corrects to 77 % for alv volume  - 01/25/2017   rec increase symb to 160 2bid trial  - alpha one AT screening  01/18/17   MM  Level 190 PFT's  02/23/2017  FEV1 1.17 (44 % ) ratio 54  p 0 % improvement from saba p symb 160 prior to study with DLCO   39 % corrects to 59 % for alv volume  - 02/23/2017    add tudorza trial > no change and could not afford  Onset of refractory flare early oct 2019  - 12/05/2017  After extensive coaching inhaler device,  effectiveness =    75% with baseline < 50%  - flutter valve given with instructions/ demonstration  DDX of  difficult airways management almost all start with A and  include Adherence, Ace Inhibitors, Acid Reflux, Active Sinus Disease, Alpha 1 Antitripsin deficiency, Anxiety masquerading as Airways dz,  ABPA,  Allergy(esp in young), Aspiration (esp in elderly), Adverse effects of meds,  Active smoking or vaping, A bunch of PE's (a small clot burden can't cause this syndrome unless there is already severe underlying pulm or vascular dz with poor reserve) plus two Bs  = Bronchiectasis and Beta blocker use..and one C= CHF   Adherence is always the initial "prime suspect" and is a multilayered concern that requires a "trust but verify" approach in every patient - starting with knowing how to use medications, especially inhalers, correctly, keeping up with refills and understanding the fundamental difference between maintenance and prns vs those medications only taken for a very short course and then stopped and not refilled.  - see hfa teaching  - return with all meds in hand using a trust but verify approach to confirm accurate Medication  Reconciliation The principal here is that until we are certain that the  patients are doing what we've asked, it makes no sense to ask them to do more.    ? Acid (or non-acid) GERD > always  difficult to exclude as up to 75% of pts in some series report no assoc GI/ Heartburn symptoms> rec max (24h)  acid suppression and diet restrictions/ reviewed and instructions given in writing including use of bed blocks  -  Of the three most common causes of  Sub-acute / recurrent or chronic cough, only one (GERD)  can actually contribute to/ trigger  the other two (asthma and post nasal drip syndrome)  and perpetuate the cylce of cough.  While not intuitively obvious, many patients with chronic low grade reflux do not cough until there is a primary insult that disturbs the protective epithelial barrier and exposes sensitive nerve endings.   This is typically viral but can due to PNDS and  either may apply here.   The point is that once this occurs, it is difficult to eliminate the cycle  using anything but a maximally effective acid suppression regimen at least in the short run, accompanied by an appropriate diet to address non acid GERD and control / eliminate the cough itself for at least 3 days with tyl #3 and use flutter to prevent airway trauma from contributing to cyclical cough   ? Allergy / asthma component > prednisone x 12 day taper  ? Active sinus dz > augmentin then ? Sinus CT   ? Adverse drug effects >  d/c antihistamines   ? Alpha one AT def > already ruled out   ? Beta blocker effects > on small doses of coreg prob ok but In the setting of respiratory symptoms of unknown etiology,  It would be preferable to use bystolic, the most beta -1  selective Beta blocker available in sample form, with bisoprolol the most selective generic choice  on the market, at least on a trial basis, to make sure the spillover Beta 2 effects of the less specific Beta blockers are not contributing to this patient's symptoms.   ? chf > nothing to indicate cardiac asthma here    >>> f/u as prev planned, call sooner if needed.   I had an extended discussion with the patient reviewing all relevant studies  completed to date and  lasting 25 minutes of a 40  minute acute office  visit addressing   severe non-specific but potentially very serious refractory respiratory symptoms of uncertain and potentially multiple  Etiologies.   See device teaching which extended face to face time for this visit for both hfa and flutter appropriate use.  Each maintenance medication was reviewed in detail including most importantly the difference between maintenance and prns and under what circumstances the prns are to be triggered using an action plan format that is not reflected in the computer generated alphabetically organized AVS.    Please see AVS for specific instructions unique to this office visit that I personally wrote and verbalized to the the pt in detail and then reviewed with pt  by my nurse highlighting any changes in therapy/plan of care  recommended at today's visit.

## 2017-12-13 ENCOUNTER — Ambulatory Visit: Payer: Medicare HMO | Admitting: Internal Medicine

## 2017-12-20 ENCOUNTER — Ambulatory Visit: Payer: Medicare HMO | Admitting: Internal Medicine

## 2017-12-20 VITALS — BP 126/74 | HR 81

## 2017-12-20 DIAGNOSIS — J449 Chronic obstructive pulmonary disease, unspecified: Secondary | ICD-10-CM

## 2017-12-20 NOTE — Patient Instructions (Addendum)
Bed blocks 6-8 inches   For cough > Mucinex dm up to 1200 mg twice daily and use the flutter valve as much as you can  Gabapentin should be three times daily   Omeprazole is Take 30-60 min before first meal of the day and pepcid ac 20 mg after supper    Please schedule a follow up visit in 3 months but call sooner if needed

## 2017-12-20 NOTE — Progress Notes (Signed)
Subjective:     Patient ID: Justin Walters, male   DOB: Aug 25, 1940,     MRN: [id number]    Brief patient profile:  43 yowm quit smoking 1995 with cough/wheeze/ sob and never recovered with documented GOLD III copd 02/23/2017      History of Present Illness  01/25/2017 1st  office visit/ Justin Walters   GOLD III with reversibility  Chief Complaint  Patient presents with  . Pulmonary Consult    Referred by the hospital. He c/o SOB and coughing for the past several wks. His cough is occ prod, but he is unsure of sputum color. He is SOB with exertion and also when he lies down.  He gets winded walking approx 100 yards. He is using his albuterol inhaler 3 x per wk on average.   baseline doe across the street to son's house  nl pace and then onset   x one year gradually worse doe and no better on inhalers but poor baseline hfa/ assoc with cough / sense of chest congestion and has to prop up at 30 degrees now at hs to get comfortable Present doe = MMRC3 = can't walk 100 yards even at a slow pace at a flat grade s stopping due to sob   Easily confused with details of care/ names of meds / not using spiriva/ was better on pred and worse since tapered rec Plan A = Automatic =  symbicort 160 Take 2 puffs first thing in am and then another 2 puffs about 12 hours later.  Work on inhaler technique:  Plan B = Backup Only use your albuterol  as a rescue  Prednisone 10 mg take  4 each am x 2 days,   2 each am x 2 days,  1 each am x 2 days and stop    02/23/2017  f/u ov/Justin Walters re:  GOLD III copd / no 02 Chief Complaint  Patient presents with  . Follow-up    PFT's today.  Breathing has improved some. He is rarely using his albuterol inhaler. He has been feeling fatigued.    better p prednisone then gradually worse  Dyspnea:  MMRC2 = can't walk a nl pace on a flat grade s sob but does fine slow and flat  Cough: none Sleep: ok 30 degrees no am congestion rec  Add tudorza one puff after symbicort in am and in pm  Work  on inhaler technique:      05/24/2017  f/u ov/Justin Walters re:  Copd III/ no 02  Just on maint symb 160 / could not afford tudorza and not sure it helped doe  Chief Complaint  Patient presents with  . Follow-up    Breathing is doing well. He rarely uses his albuterol inhaler. No new co's.   Dyspnea:  MMRC2 = can't walk a nl pace on a flat grade s sob but does fine slow pace pretty much anywhere he wants to go  Cough: none Sleep: ok flat SABA use:  Rare  Hb every hs x sev months / resolves p about an hour, better p sit up / no assoc dysphagia or daytime symptoms/ varies some with how early he eats supper and how much, not sure what type meals make it worse  rec GERD diet  If not better add pepcid ac 20 mg one hour before bed > did not  Do   10/31/17 NP eval for 3-4 weeks worse sob/ cough rec Xopenex in office  Depo 80 Prednisone 10mg  tablet  >>>  4 tabs for 2 days, then 3 tabs for 2 days, 2 tabs for 2 days, then 1 tab for 2 days, then sto Azithromycin 250mg  tablet  >>>Take 2 tablets (500mg  total) today, and then 1 tablet (250mg ) for the next four days      11/21/2017 acute extended ov/Justin Walters re: refractory cough x early oct 2019  / no change sob  Chief Complaint  Patient presents with  . Acute Visit    pt c/o sinus congestion, pnd, prod cough with white mucus X1 month.  has had 2 rounds of pred and doxy with little relief.   dyspnea : MMRC2 = can't walk a nl pace on a flat grade s sob but does fine slow and flat= baseline when not coughing  Cough > white not worse in am's/ harsh daytime coughing fts Sleeping flat two  pillow saba  Not at all now rec Plan A = Automatic = symbicort 160 Take 2 puffs first thing in am and then another 2 puffs about 12 hours later.  Whenever your cough flares > try  Try prilosec otc 20mg   Take 30-60 min before first meal of the day and Pepcid ac (famotidine) 20 mg one after hour after supper or at bedtime until cough is completely gone for at least a week  without the need for cough suppression Plan B = Backup Only use your albuterol inhaler as a rescue medication  goes up over your usual need - Don't leave home without it !!  (think of it like the spare tire for your car)  For cough Mucinex dm 1200 mg every 12 hours and the tylenol #3  Up to every 4 hours if need for persistent irritating coughing  GERD diet    12/05/2017 acute extended ov/Justin Walters re: cough and sob  Chief Complaint  Patient presents with  . Acute Visit    Increased SOB, cough and chest congestion x 1 month. Sputum is white to yellow. He is using his albuterol inhaler 2 x per wk on average.   cough is worse p supper and fine on back flat bed/ no blocks  In am feels fine but groggy p benadryl - can't up sleep s it W/in 20 min of rising lots of pnds > white mucus  Not using mucinex dm / reversing ppi and h2  Technique on symb poor and not using saba correctly in terms or when to use  rec Augmentin 875 mg take one pill twice daily  X 10 days - take at breakfast and supper with large glass of water.  .  Take prednisone  4 for three days 3 for three days 2 for three days 1 for three days and stop  Omeprazole 20 mg Take 30- 60 min before your first and last meals of the day and continue famotidine 20 mg at bedtime Bed blocks would be a great idea Try off zquil and tylenol #3  Up to 2 at bedtime For cough > mucinex dm 1200 mg every 12 hours and use the flutter valve as much as possible and if still can't stop coughing then use the tylenol #3   Keep previous appt     12/20/2017  f/u ov/Justin Walters re:  Cough better, doe not quite baseline on symb 10 2bid  No worse off pred Chief Complaint  Patient presents with  . Follow-up    Breathing has improved some but is not back to his normal baseline. He rarely uses his albuterol inhaler.   Dyspnea:  MMRC2 = can't walk a nl pace on a flat grade s sob but does fine slow and flat  Cough: better/ no mucus production at all  Sleeping: ok flat   SABA use: rarely 02: none    No obvious day to day or daytime variability or assoc excess/ purulent sputum or mucus plugs or hemoptysis or cp or chest tightness, subjective wheeze or overt sinus or hb symptoms.   Sleeping as above without nocturnal  or early am exacerbation  of respiratory  c/o's or need for noct saba. Also denies any obvious fluctuation of symptoms with weather or environmental changes or other aggravating or alleviating factors except as outlined above   No unusual exposure hx or h/o childhood pna/ asthma or knowledge of premature birth.  Current Allergies, Complete Past Medical History, Past Surgical History, Family History, and Social History were reviewed in [facility] record.  ROS  The following are not active complaints unless bolded Hoarseness, sore throat, dysphagia, dental problems, itching, sneezing,  nasal congestion or discharge of excess mucus or purulent secretions, ear ache,   fever, chills, sweats, unintended wt loss or wt gain, classically pleuritic or exertional cp,  orthopnea pnd or arm/hand swelling  or leg swelling, presyncope, palpitations, abdominal pain, anorexia, nausea, vomiting, diarrhea  or change in bowel habits or change in bladder habits, change in stools or change in urine, dysuria, hematuria,  rash, arthralgias, visual complaints, headache, numbness, weakness or ataxia or problems with walking or coordination,  change in mood or  memory.        Current Meds  Medication Sig  . albuterol (PROVENTIL HFA;VENTOLIN HFA) 108 (90 Base) MCG/ACT inhaler INHALE 2 PUFFS INTO THE LUNGS EVERY 6 (SIX) HOURS AS NEEDED FOR WHEEZING OR SHORTNESS OF BREATH.  Marland Kitchen amLODipine (NORVASC) 5 MG tablet TAKE 1 TABLET EVERY DAY  . aspirin 81 MG EC tablet Take 1 tablet (81 mg total) by mouth daily. Swallow whole.  . budesonide-formoterol (SYMBICORT) 160-4.5 MCG/ACT inhaler Inhale 2 puffs into the lungs 2 (two) times daily.  . carvedilol (COREG) 12.5  MG tablet TAKE 1 TABLET TWICE DAILY WITH MEALS  . gabapentin (NEURONTIN) 300 MG capsule TAKE 1 CAPSULE THREE TIMES DAILY  . Guaifenesin (MUCINEX MAXIMUM STRENGTH) 1200 MG TB12 Take 1 tablet by mouth 2 (two) times daily.  . Lactobacillus (PROBIOTIC ACIDOPHILUS PO) Take 1 tablet by mouth daily.  Marland Kitchen omeprazole (PRILOSEC) 20 MG capsule Take 30- 60 min before your first and last meals of the day  . Respiratory Therapy Supplies (FLUTTER) DEVI Use as directed  . rosuvastatin (CRESTOR) 20 MG tablet TAKE 1 TABLET EVERY DAY (Patient taking differently: Take 20 mg by mouth daily. )  . terazosin (HYTRIN) 10 MG capsule TAKE 1 CAPSULE AT BEDTIME (Patient taking differently: Take 10 mg by mouth at bedtime)                  Objective:   Physical Exam   amb wm min rattling now        12/05/2017       157  11/21/2017     157  05/24/2017        164    01/25/17 150 lb 12.8 oz (68.4 kg)  01/18/17 150 lb (68 kg)  01/08/17 150 lb (68 kg)       Vital signs reviewed - Note on arrival 02 sats  96% on RA        HEENT: nl dentition / oropharynx. Nl external  ear canals without cough reflex -  Mild bilateral non-specific turbinate edema     NECK :  without JVD/Nodes/TM/ nl carotid upstrokes bilaterally   LUNGS: no acc muscle use,  Mod barrel  contour chest wall with bilateral  Distant bs s audible wheeze and  without cough on insp or exp maneuver and mod  Hyperresonant  to  percussion bilaterally     CV:  RRR  no s3 or murmur or increase in P2, and no edema   ABD:  soft and nontender with pos mid  insp Hoover's  in the supine position. No bruits or organomegaly appreciated, bowel sounds nl  MS:   Nl gait/  ext warm without deformities, calf tenderness, cyanosis or clubbing No obvious joint restrictions   SKIN: warm and dry without lesions    NEURO:  alert, approp, nl sensorium with  no motor or cerebellar deficits apparent.           Assessment:

## 2017-12-23 ENCOUNTER — Encounter: Payer: Self-pay | Admitting: Internal Medicine

## 2017-12-23 NOTE — Assessment & Plan Note (Signed)
Quit smoking 1995 PFT's  01/20/2017  FEV1 1.05 (39 % ) ratio 54  p 18 % improvement from saba p ? prior to study with DLCO  50/53  % corrects to 77 % for alv volume  - 01/25/2017   rec increase symb to 160 2bid trial  - alpha one AT screening  01/18/17   MM  Level 190 PFT's  02/23/2017  FEV1 1.17 (44 % ) ratio 54  p 0 % improvement from saba p symb 160 prior to study with DLCO   39 % corrects to 59 % for alv volume  - 02/23/2017    add tudorza trial > no change and could not afford - 11/21/2017  After extensive coaching inhaler device,  effectiveness =    90% - 12/05/2017 flutter valve added    He has finally turned the corner p recent flare > no change rx needed  I had an extended discussion with the patient reviewing all relevant studies completed to date and  lasting 10  minutes of a 15 minute visit    Each maintenance medication was reviewed in detail including most importantly the difference between maintenance and prns and under what circumstances the prns are to be triggered using an action plan format that is not reflected in the computer generated alphabetically organized AVS.     Please see AVS for specific instructions unique to this visit that I personally wrote and verbalized to the the pt in detail and then reviewed with pt  by my nurse highlighting any  changes in therapy recommended at today's visit to their plan of care.

## 2018-01-01 ENCOUNTER — Ambulatory Visit: Payer: Medicare HMO | Admitting: Internal Medicine

## 2018-01-01 ENCOUNTER — Encounter: Payer: Self-pay | Admitting: Internal Medicine

## 2018-01-01 DIAGNOSIS — J441 Chronic obstructive pulmonary disease with (acute) exacerbation: Secondary | ICD-10-CM | POA: Diagnosis not present

## 2018-01-01 MED ORDER — DOXYCYCLINE HYCLATE 100 MG PO TABS: 100.0000 mg | ORAL_TABLET | Freq: Two times a day (BID) | ORAL | 0 refills | Status: DC

## 2018-01-01 MED ORDER — PREDNISONE 20 MG PO TABS: 40.0000 mg | ORAL_TABLET | Freq: Every day | ORAL | 0 refills | Status: DC

## 2018-01-01 NOTE — Assessment & Plan Note (Signed)
With exacerbation today. Rx for prednisone and doxycycline for flare. Encouraged lifelong smoking cessation.

## 2018-01-01 NOTE — Patient Instructions (Signed)
We have sent in the prednisone to take 2 pills daily for 5 days.   We have sent in the antibiotic doxycycline to take 1 pill twice a day for 1 week.

## 2018-01-01 NOTE — Progress Notes (Signed)
   Subjective:   Patient ID: Justin Walters, male    DOB: 09-18-1940, 77 y.o.   MRN: [id number]  HPI The patient is a 77 y.o. man coming in for cold symptoms. Started 2 weeks ago. Main symptoms are: cough with sputum of whitish and yellow, some SOB, some right ear pain. Denies fevers or chills. Has concurrent COPD but is a current non-smoker. He is taking his symbicort. Using albuterol more often than usual. Overall it is getting worse. Has tried albuterol which helps temporarily.   Review of Systems  Constitutional: Positive for activity change and appetite change. Negative for chills, fatigue, fever and unexpected weight change.  HENT: Positive for congestion, ear pain, postnasal drip and rhinorrhea. Negative for ear discharge, sinus pressure, sinus pain, sneezing, sore throat, tinnitus, trouble swallowing and voice change.   Eyes: Negative.   Respiratory: Positive for cough, shortness of breath and wheezing. Negative for chest tightness.   Cardiovascular: Negative.   Gastrointestinal: Negative.   Musculoskeletal: Positive for myalgias.  Neurological: Negative.     Objective:  Physical Exam Constitutional:      Appearance: He is well-developed.  HENT:     Head: Normocephalic and atraumatic.     Comments: Oropharynx with redness and clear drainage, nose with swollen turbinates, TMs normal bilaterally.  Neck:     Musculoskeletal: Normal range of motion.     Thyroid: No thyromegaly.  Cardiovascular:     Rate and Rhythm: Normal rate and regular rhythm.  Pulmonary:     Effort: Pulmonary effort is normal. No respiratory distress.     Breath sounds: Wheezing and rhonchi present. No rales.  Abdominal:     Palpations: Abdomen is soft.  Musculoskeletal:        General: Tenderness present.  Lymphadenopathy:     Cervical: No cervical adenopathy.  Skin:    General: Skin is warm and dry.  Neurological:     Mental Status: He is alert and oriented to person, place, and time.     Vitals:     01/01/18 0802  BP: 90/64  Pulse: 77  Temp: 97.7 F (36.5 C)  TempSrc: Oral  SpO2: 93%  Weight: 155 lb (70.3 kg)  Height: 5\' 5"  (1.651 m)    Assessment & Plan:

## 2018-01-11 ENCOUNTER — Other Ambulatory Visit: Payer: Self-pay | Admitting: Internal Medicine

## 2018-01-11 MED ORDER — GABAPENTIN 300 MG PO CAPS: ORAL_CAPSULE | ORAL | 1 refills | Status: DC

## 2018-01-11 NOTE — Telephone Encounter (Signed)
Copied from [place] [id number]. Topic: Quick Communication - Rx Refill/Question >> Jan 11, 2018  9:39 AM Blase Mess A wrote: Medication: budesonide-formoterol [facility]) 160-4.5 MCG/ACT inhaler [[id number]] 59 day supply   Has the patient contacted their pharmacy? Yes  (Agent: If no, request that the patient contact the pharmacy for the refill.) (Agent: If yes, when and what did the pharmacy advise?)  Preferred Pharmacy (with phone number or street name): [facility], [place] [phone] (Phone) [phone] (Fax)    Agent: Please be advised that RX refills may take up to 3 business days. We ask that you follow-up with your pharmacy.

## 2018-01-11 NOTE — Telephone Encounter (Signed)
Copied from [place] [id number]. Topic: Quick Communication - Rx Refill/Question >> Jan 11, 2018  9:41 AM Blase Mess A wrote: Medication: gabapentin (NEURONTIN) 300 MG capsule [[id number]]  Has the patient contacted their pharmacy? Yes  (Agent: If no, request that the patient contact the pharmacy for the refill.) (Agent: If yes, when and what did the pharmacy advise?)  Preferred Pharmacy (with phone number or street name): [facility], [place] [phone] (Phone) [phone] (Fax)    Agent: Please be advised that RX refills may take up to 3 business days. We ask that you follow-up with your pharmacy.

## 2018-01-21 ENCOUNTER — Ambulatory Visit: Payer: Medicare HMO | Admitting: Internal Medicine

## 2018-01-23 ENCOUNTER — Other Ambulatory Visit: Payer: Self-pay | Admitting: Internal Medicine

## 2018-01-25 ENCOUNTER — Encounter: Payer: Self-pay | Admitting: Internal Medicine

## 2018-01-25 ENCOUNTER — Ambulatory Visit: Payer: Medicare HMO | Admitting: Internal Medicine

## 2018-01-25 VITALS — BP 104/60 | HR 83 | Temp 97.6°F | Ht 67.0 in | Wt 156.6 lb

## 2018-01-25 DIAGNOSIS — J441 Chronic obstructive pulmonary disease with (acute) exacerbation: Secondary | ICD-10-CM | POA: Diagnosis not present

## 2018-01-25 DIAGNOSIS — J449 Chronic obstructive pulmonary disease, unspecified: Secondary | ICD-10-CM

## 2018-01-25 MED ORDER — IPRATROPIUM-ALBUTEROL 0.5-2.5 (3) MG/3ML IN SOLN: RESPIRATORY_TRACT | 11 refills | Status: DC

## 2018-01-25 NOTE — Patient Instructions (Addendum)
Duoneb (albuterol and atrovent)  4 x daily  Only use your albuterol as a rescue medication to be used if you can't catch your breath by resting or doing a relaxed purse lip breathing pattern.  - The less you use it, the better it will work when you need it. - Ok to use up to 2 puffs  every 4 hours if you must but call for immediate appointment if use goes up over your usual need - Don't leave home without it !!  (think of it like the spare tire for your car)    Work on inhaler technique:  relax and gently blow all the way out then take a nice smooth deep breath back in, triggering the inhaler at same time you start breathing in.  Hold for up to 5 seconds if you can. Blow out thru nose. Rinse and gargle with water when done  Be sure you take prilosec Take 30- 60 min before your first and last meals of the day    Change your appt to Please schedule a follow up office visit in 4 weeks, sooner if needed  with all medications /inhalers/ solutions in hand so we can verify exactly what you are taking. This includes all medications from all doctors and over the counters - bring your drug formulary list for alternatives - add sinus ct / hrct chest if still coughing on return

## 2018-01-25 NOTE — Progress Notes (Signed)
Subjective:     Patient ID: Justin Walters, male   DOB: 09/26/1940,     MRN: [id number]    Brief patient profile:  9 yowm MM quit smoking 1995 with cough/wheeze/ sob and never recovered with documented GOLD III copd 02/23/2017      History of Present Illness  01/25/2017 1st  office visit/ Wert   GOLD III with reversibility  Chief Complaint  Patient presents with  . Pulmonary Consult    Referred by the hospital. He c/o SOB and coughing for the past several wks. His cough is occ prod, but he is unsure of sputum color. He is SOB with exertion and also when he lies down.  He gets winded walking approx 100 yards. He is using his albuterol inhaler 3 x per wk on average.   baseline doe across the street to son's house  nl pace and then onset   x one year gradually worse doe and no better on inhalers but poor baseline hfa/ assoc with cough / sense of chest congestion and has to prop up at 30 degrees now at hs to get comfortable Present doe = MMRC3 = can't walk 100 yards even at a slow pace at a flat grade s stopping due to sob   Easily confused with details of care/ names of meds / not using spiriva/ was better on pred and worse since tapered rec Plan A = Automatic =  symbicort 160 Take 2 puffs first thing in am and then another 2 puffs about 12 hours later.  Work on inhaler technique:  Plan B = Backup Only use your albuterol  as a rescue  Prednisone 10 mg take  4 each am x 2 days,   2 each am x 2 days,  1 each am x 2 days and stop     02/23/2017  f/u ov/Wert re:  GOLD III copd / no 02 Chief Complaint  Patient presents with  . Follow-up    PFT's today.  Breathing has improved some. He is rarely using his albuterol inhaler. He has been feeling fatigued.    better p prednisone then gradually worse  Dyspnea:  MMRC2 = can't walk a nl pace on a flat grade s sob but does fine slow and flat  Cough: none Sleep: ok 30 degrees no am congestion rec  Add tudorza one puff after symbicort in am and in pm   Work on inhaler technique:      05/24/2017  f/u ov/Wert re:  Copd III/ no 02  Just on maint symb 160 / could not afford tudorza and not sure it helped doe  Chief Complaint  Patient presents with  . Follow-up    Breathing is doing well. He rarely uses his albuterol inhaler. No new co's.   Dyspnea:  MMRC2 = can't walk a nl pace on a flat grade s sob but does fine slow pace pretty much anywhere he wants to go  Cough: none Sleep: ok flat SABA use:  Rare  Hb every hs x sev months / resolves p about an hour, better p sit up / no assoc dysphagia or daytime symptoms/ varies some with how early he eats supper and how much, not sure what type meals make it worse  rec GERD diet  If not better add pepcid ac 20 mg one hour before bed > did not  Do   10/31/17 NP eval for 3-4 weeks worse sob/ cough rec Xopenex in office  Depo 80 Prednisone  10mg  tablet  >>>4 tabs for 2 days, then 3 tabs for 2 days, 2 tabs for 2 days, then 1 tab for 2 days, then sto Azithromycin 250mg  tablet  >>>Take 2 tablets (500mg  total) today, and then 1 tablet (250mg ) for the next four days      11/21/2017 acute extended ov/Wert re: refractory cough x early oct 2019  / no change sob  Chief Complaint  Patient presents with  . Acute Visit    pt c/o sinus congestion, pnd, prod cough with white mucus X1 month.  has had 2 rounds of pred and doxy with little relief.   dyspnea : MMRC2 = can't walk a nl pace on a flat grade s sob but does fine slow and flat= baseline when not coughing  Cough > white not worse in am's/ harsh daytime coughing fts Sleeping flat two  pillow saba  Not at all now rec Plan A = Automatic = symbicort 160 Take 2 puffs first thing in am and then another 2 puffs about 12 hours later.  Whenever your cough flares > try  Try prilosec otc 20mg   Take 30-60 min before first meal of the day and Pepcid ac (famotidine) 20 mg one after hour after supper or at bedtime until cough is completely gone for at least a  week without the need for cough suppression Plan B = Backup Only use your albuterol inhaler as a rescue medication  goes up over your usual need - Don't leave home without it !!  (think of it like the spare tire for your car)  For cough Mucinex dm 1200 mg every 12 hours and the tylenol #3  Up to every 4 hours if need for persistent irritating coughing  GERD diet    12/05/2017 acute extended ov/Wert re: cough and sob  Chief Complaint  Patient presents with  . Acute Visit    Increased SOB, cough and chest congestion x 1 month. Sputum is white to yellow. He is using his albuterol inhaler 2 x per wk on average.   cough is worse p supper and fine on back flat bed/ no blocks  In am feels fine but groggy p benadryl - can't up sleep s it W/in 20 min of rising lots of pnds > white mucus  Not using mucinex dm / reversing ppi and h2  Technique on symb poor and not using saba correctly in terms or when to use  rec Augmentin 875 mg take one pill twice daily  X 10 days - take at breakfast and supper with large glass of water.  .  Take prednisone  4 for three days 3 for three days 2 for three days 1 for three days and stop  Omeprazole 20 mg Take 30- 60 min before your first and last meals of the day and continue famotidine 20 mg at bedtime Bed blocks would be a great idea Try off zquil and tylenol #3  Up to 2 at bedtime For cough > mucinex dm 1200 mg every 12 hours and use the flutter valve as much as possible and if still can't stop coughing then use the tylenol #3        12/20/2017  f/u ov/Wert re:  Cough better, doe not quite baseline on symb 10 2bid  No worse off pred Chief Complaint  Patient presents with  . Follow-up    Breathing has improved some but is not back to his normal baseline. He rarely uses his albuterol inhaler.  Dyspnea:  MMRC2 = can't walk a nl pace on a flat grade s sob but does fine slow and flat  Cough: better/ no mucus production at all  Sleeping: ok flat  SABA use:  rarely 02: none   rec Bed blocks 6-8 inches  For cough > Mucinex dm up to 1200 mg twice daily and use the flutter valve as much as you can Gabapentin should be three times daily  Omeprazole is Take 30-60 min before first meal of the day and pepcid ac 20 mg after supper     01/25/2018 acute extended ov/Wert re:  Copd III / stopped symb too expensive, does better with nebs / overall not doing well since early Oct 2019  Chief Complaint  Patient presents with  . Acute Visit    SOB, runny nose, ear pain, wheezing, cough- white mucus at times, denies fever   brought meds/ not using ppi as instructed and ? If really using flutter, doesn't think it helps but did not bring it as rec to show how to use/ using halls cough drops with menthol which was discouraged last ov/ gradually worse doe/ subj wheeze x sev weeks no better with hfa but technique poor/ no purulent mucus/ cough worse at hs and in am. No better on prednisone    No obvious day to day or daytime variability or   mucus plugs or hemoptysis or cp or chest tightness,  or overt sinus or hb symptoms.    Also denies any obvious fluctuation of symptoms with weather or environmental changes or other aggravating or alleviating factors except as outlined above   No unusual exposure hx or h/o childhood pna/ asthma or knowledge of premature birth.  Current Allergies, Complete Past Medical History, Past Surgical History, Family History, and Social History were reviewed in [employer] record.  ROS  The following are not active complaints unless bolded Hoarseness, sore throat, dysphagia, dental problems, itching, sneezing,  nasal congestion or discharge of excess mucus or purulent secretions, ear ache,   fever, chills, sweats, unintended wt loss or wt gain, classically pleuritic or exertional cp,  orthopnea pnd or arm/hand swelling  or leg swelling, presyncope, palpitations, abdominal pain, anorexia, nausea, vomiting, diarrhea  or  change in bowel habits or change in bladder habits, change in stools or change in urine, dysuria, hematuria,  rash, arthralgias, visual complaints, headache, numbness, weakness or ataxia or problems with walking or coordination,  change in mood or  memory.        Current Meds  Medication Sig  . albuterol (PROVENTIL HFA;VENTOLIN HFA) 108 (90 Base) MCG/ACT inhaler INHALE 2 PUFFS INTO THE LUNGS EVERY 6 (SIX) HOURS AS NEEDED FOR WHEEZING OR SHORTNESS OF BREATH.  Marland Kitchen amLODipine (NORVASC) 5 MG tablet TAKE 1 TABLET EVERY DAY  . aspirin 81 MG EC tablet Take 1 tablet (81 mg total) by mouth daily. Swallow whole.  . budesonide-formoterol (SYMBICORT) 160-4.5 MCG/ACT inhaler Inhale 2 puffs into the lungs 2 (two) times daily.  . carvedilol (COREG) 12.5 MG tablet TAKE 1 TABLET TWICE DAILY WITH MEALS  . famotidine (PEPCID) 20 MG tablet Take 20 mg by mouth at bedtime.  . gabapentin (NEURONTIN) 300 MG capsule TAKE 1 CAPSULE THREE TIMES DAILY  . Guaifenesin (MUCINEX MAXIMUM STRENGTH) 1200 MG TB12 Take 1 tablet by mouth 2 (two) times daily.  . Lactobacillus (PROBIOTIC ACIDOPHILUS PO) Take 1 tablet by mouth daily.  Marland Kitchen omeprazole (PRILOSEC) 20 MG capsule Take 30- 60 min before your first and  last meals of the day  . Respiratory Therapy Supplies (FLUTTER) DEVI Use as directed  . rosuvastatin (CRESTOR) 20 MG tablet TAKE 1 TABLET EVERY DAY (Patient taking differently: Take 20 mg by mouth daily. )  . terazosin (HYTRIN) 10 MG capsule TAKE 1 CAPSULE AT BEDTIME (Patient taking differently: Take 10 mg by mouth at bedtime)  .     Marland Kitchen                  Objective:   Physical Exam  amb wm/ min rattling cough   01/25/2018       156  12/05/2017       157  11/21/2017     157  05/24/2017        164    01/25/17 150 lb 12.8 oz (68.4 kg)  01/18/17 150 lb (68 kg)  01/08/17 150 lb (68 kg)     Vital signs reviewed - Note on arrival 02 sats  92% on RA        HEENT: nl dentition / oropharynx. Nl external ear canals without  cough reflex -  Mild bilateral non-specific turbinate edema     NECK :  without JVD/Nodes/TM/ nl carotid upstrokes bilaterally   LUNGS: no acc muscle use,  Mod barrel  contour chest wall with bilateral  Insp/exp rhonchi s  audible wheeze and  without cough on insp or exp maneuver and mod  Hyperresonant  to  percussion bilaterally     CV:  RRR  no s3 or murmur or increase in P2, and no edema   ABD:  soft and nontender with pos mid insp Hoover's  in the supine position. No bruits or organomegaly appreciated, bowel sounds nl  MS:   Nl gait/  ext warm without deformities, calf tenderness, cyanosis or clubbing No obvious joint restrictions   SKIN: warm and dry without lesions    NEURO:  alert, approp, nl sensorium with  no motor or cerebellar deficits apparent.               Assessment:

## 2018-01-26 ENCOUNTER — Encounter: Payer: Self-pay | Admitting: Internal Medicine

## 2018-01-26 NOTE — Assessment & Plan Note (Signed)
Quit smoking 1995 PFT's  01/20/2017  FEV1 1.05 (39 % ) ratio 54  p 18 % improvement from saba p ? prior to study with DLCO  50/53  % corrects to 77 % for alv volume  - 01/25/2017   rec increase symb to 160 2bid trial  - alpha one AT screening  01/18/17   MM  Level 190 PFT's  02/23/2017  FEV1 1.17 (44 % ) ratio 54  p 0 % improvement from saba p symb 160 prior to study with DLCO   39 % corrects to 59 % for alv volume  - 02/23/2017    add tudorza trial > no change and could not afford  Onset of refractory flare early oct 2019   - 12/05/17 flutter valve rx  - 01/25/2018  After extensive coaching inhaler device,  effectiveness =    75%   - changed to duoneb 01/25/2018  And max gerd rx rec   He is struggling with issues of compliance/ use of hfa so best option in short run is change to duoneb qid and return in 4 weeks with formulary in hand to sort out best needs longterm  If cough not better at that point needs ct sinus/hrct chest to eval for chronic sinusitis/ bronchiectasis.    I had an extended discussion with the patient reviewing all relevant studies completed to date and  lasting 15 to 20 minutes of a 25 minute acute office visit    See device teaching which extended face to face time for this visit.  Each maintenance medication was reviewed in detail including emphasizing most importantly the difference between maintenance and prns and under what circumstances the prns are to be triggered using an action plan format that is not reflected in the computer generated alphabetically organized AVS which I have not found useful in most complex patients, especially with respiratory illnesses  Please see AVS for specific instructions unique to this visit that I personally wrote and verbalized to the the pt in detail and then reviewed with pt  by my nurse highlighting any  changes in therapy recommended at today's visit to their plan of care.

## 2018-01-28 ENCOUNTER — Telehealth: Payer: Self-pay | Admitting: Internal Medicine

## 2018-01-28 NOTE — Telephone Encounter (Signed)
Spoke with pt, he states he received a call from [facility] and they are sending the nebulizer machine out tomorrow. Nothing further is needed.    Referral Notes  Number of Notes: 4  Type Date User Summary Attachment  General 01/28/2018 8:54 AM Leonie Green, Rosalene Billings - -  Note   Confirmation received from [facility].        Type Date User Summary Attachment  General 01/25/2018 5:18 PM Ilona Sorrel - -  Note   Message sent to [place] at [facility].        Type Date User Summary Attachment  General 01/25/2018 2:50 PM Ilona Sorrel - -  Note   Holding for signature.        Type Date User Summary Attachment  Provider Comments 01/25/2018 2:18 PM Pinion, Waldemar Dickens, CMA Provider Comments -  Note   Please provide patient with a nebulizer.   No current DME        Referral Order   Order  Ambulatory Referral for DME (Order # [id number]) on 01/25/2018  View Encounter

## 2018-01-29 DIAGNOSIS — J449 Chronic obstructive pulmonary disease, unspecified: Secondary | ICD-10-CM | POA: Diagnosis not present

## 2018-02-08 IMAGING — CR DG CHEST 2V
2 series · 2 of 2 positions shown · non-contrast
Comparison: PA and lateral chest 11/15/2016 and 09/30/2015.

CLINICAL DATA: Worsening of chronic shortness of breath. History of
COPD.

EXAM:
CHEST  2 VIEW

[w chest pa]
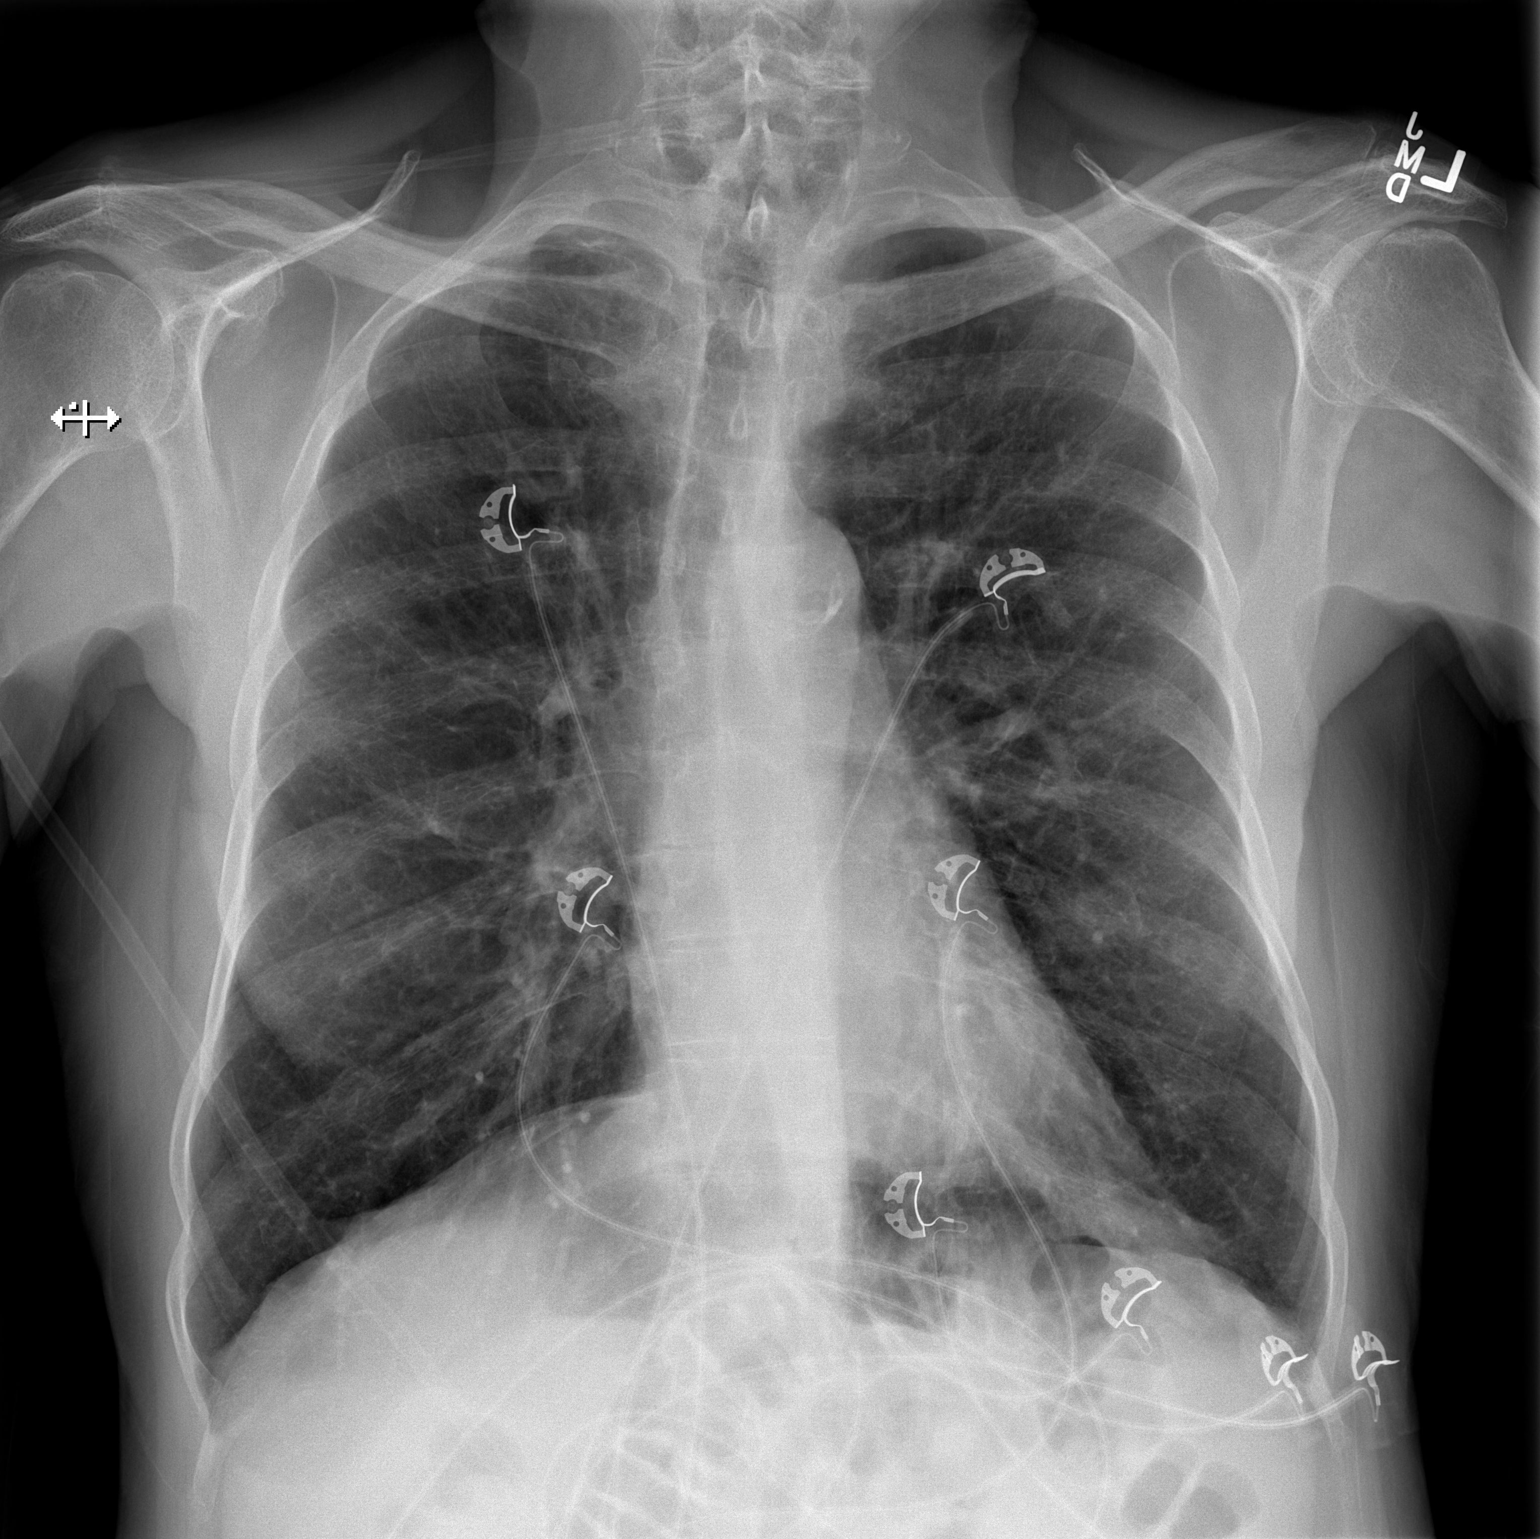

[w chest lat]
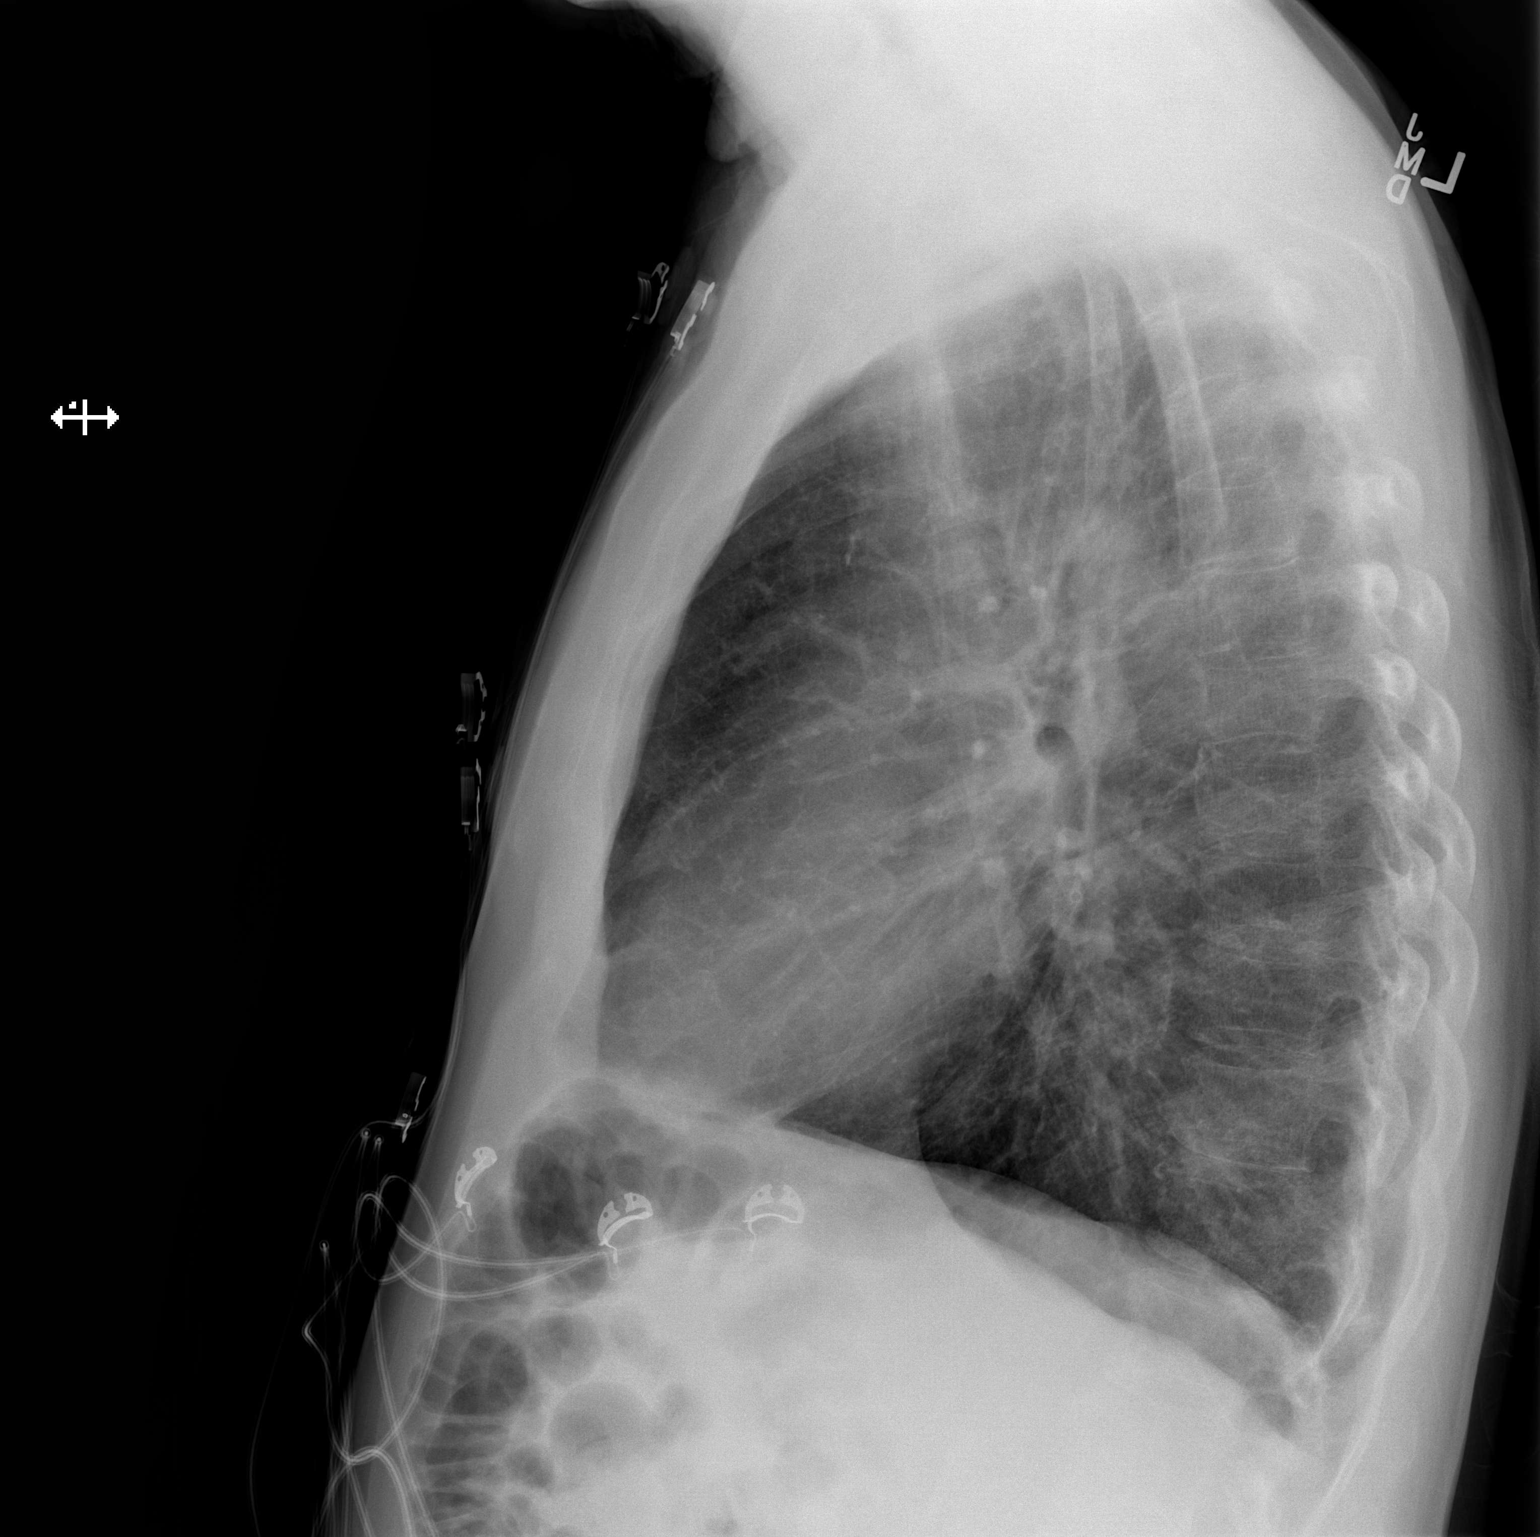

[2 of 2 positions shown; findings below may reference images not displayed]

FINDINGS: The chest is hyperexpanded with attenuation of the pulmonary
vasculature. Lungs are clear. No pneumothorax or pleural effusion.
Heart size is normal. Aortic atherosclerosis noted. No focal bony
abnormality.
IMPRESSION: No acute disease.

Emphysema.

Atherosclerosis.

## 2018-02-12 DIAGNOSIS — R8271 Bacteriuria: Secondary | ICD-10-CM | POA: Diagnosis not present

## 2018-02-12 DIAGNOSIS — R3912 Poor urinary stream: Secondary | ICD-10-CM | POA: Diagnosis not present

## 2018-02-12 DIAGNOSIS — N35011 Post-traumatic bulbous urethral stricture: Secondary | ICD-10-CM | POA: Diagnosis not present

## 2018-02-13 ENCOUNTER — Other Ambulatory Visit: Payer: Self-pay | Admitting: Internal Medicine

## 2018-02-19 DIAGNOSIS — R3912 Poor urinary stream: Secondary | ICD-10-CM | POA: Diagnosis not present

## 2018-02-22 ENCOUNTER — Ambulatory Visit: Payer: Medicare HMO | Admitting: Internal Medicine

## 2018-02-22 ENCOUNTER — Other Ambulatory Visit: Payer: Self-pay | Admitting: Internal Medicine

## 2018-02-22 ENCOUNTER — Telehealth: Payer: Self-pay | Admitting: Internal Medicine

## 2018-02-22 ENCOUNTER — Encounter: Payer: Self-pay | Admitting: Internal Medicine

## 2018-02-22 ENCOUNTER — Ambulatory Visit (INDEPENDENT_AMBULATORY_CARE_PROVIDER_SITE_OTHER)
Admission: RE | Admit: 2018-02-22 | Discharge: 2018-02-22 | Disposition: A | Discharge status: Home / Self Care | Payer: Medicare HMO | Source: Ambulatory Visit | Attending: Internal Medicine | Admitting: Internal Medicine

## 2018-02-22 VITALS — BP 100/56 | HR 85 | Ht <= 58 in | Wt 160.8 lb

## 2018-02-22 DIAGNOSIS — R0609 Other forms of dyspnea: Secondary | ICD-10-CM

## 2018-02-22 DIAGNOSIS — J9611 Chronic respiratory failure with hypoxia: Secondary | ICD-10-CM | POA: Diagnosis not present

## 2018-02-22 DIAGNOSIS — R918 Other nonspecific abnormal finding of lung field: Secondary | ICD-10-CM

## 2018-02-22 DIAGNOSIS — J449 Chronic obstructive pulmonary disease, unspecified: Secondary | ICD-10-CM

## 2018-02-22 DIAGNOSIS — J189 Pneumonia, unspecified organism: Secondary | ICD-10-CM | POA: Diagnosis not present

## 2018-02-22 DIAGNOSIS — R0600 Dyspnea, unspecified: Secondary | ICD-10-CM

## 2018-02-22 LAB — BASIC METABOLIC PANEL
BUN: 19 mg/dL (ref 6–23)
CALCIUM: 9 mg/dL (ref 8.4–10.5)
CHLORIDE: 101 meq/L (ref 96–112)
CO2: 29 meq/L (ref 19–32)
Creatinine, Ser: 1.38 mg/dL (ref 0.40–1.50)
GFR: 49.96 mL/min — ABNORMAL LOW (ref >60.00)
Glucose, Bld: 107 mg/dL — ABNORMAL HIGH (ref 70–99)
Potassium: 4.6 mEq/L (ref 3.5–5.1)
Sodium: 139 mEq/L (ref 135–145)

## 2018-02-22 LAB — CBC WITH DIFFERENTIAL/PLATELET
Basophils Absolute: 0.1 10*3/uL (ref 0.0–0.1)
Basophils Relative: 0.7 % (ref 0.0–3.0)
Eosinophils Absolute: 0.4 10*3/uL (ref 0.0–0.7)
Eosinophils Relative: 2.8 % (ref 0.0–5.0)
HCT: 33.9 % — ABNORMAL LOW (ref 39.0–52.0)
Hemoglobin: 11 g/dL — ABNORMAL LOW (ref 13.0–17.0)
Lymphocytes Relative: 8.4 % — ABNORMAL LOW (ref 12.0–46.0)
Lymphs Abs: 1.1 10*3/uL (ref 0.7–4.0)
MCHC: 32.6 g/dL (ref 30.0–36.0)
MCV: 88.8 fl (ref 78.0–100.0)
Monocytes Absolute: 1.3 10*3/uL — ABNORMAL HIGH (ref 0.1–1.0)
Monocytes Relative: 10.2 % (ref 3.0–12.0)
Neutro Abs: 10 10*3/uL — ABNORMAL HIGH (ref 1.4–7.7)
Neutrophils Relative %: 77.9 % — ABNORMAL HIGH (ref 43.0–77.0)
Platelets: 321 10*3/uL (ref 150.0–400.0)
RBC: 3.81 Mil/uL — AB (ref 4.22–5.81)
RDW: 15.2 % (ref 11.5–15.5)
WBC: 12.9 10*3/uL — AB (ref 4.0–10.5)

## 2018-02-22 LAB — BRAIN NATRIURETIC PEPTIDE: Pro B Natriuretic peptide (BNP): 137 pg/mL — ABNORMAL HIGH (ref 0.0–100.0)

## 2018-02-22 LAB — TSH: TSH: 1.67 u[IU]/mL (ref 0.35–4.50)

## 2018-02-22 LAB — D-DIMER, QUANTITATIVE: D-Dimer, Quant: 1.55 mcg/mL FEU — ABNORMAL HIGH (ref <0.50)

## 2018-02-22 MED ORDER — PREDNISONE 10 MG PO TABS: ORAL_TABLET | ORAL | 0 refills | Status: DC

## 2018-02-22 MED ORDER — BUDESONIDE-FORMOTEROL FUMARATE 160-4.5 MCG/ACT IN AERO: 2.0000 | INHALATION_SPRAY | Freq: Two times a day (BID) | RESPIRATORY_TRACT | 0 refills | Status: DC

## 2018-02-22 MED ORDER — LEVOFLOXACIN 500 MG PO TABS: 500.0000 mg | ORAL_TABLET | Freq: Every day | ORAL | 0 refills | Status: DC

## 2018-02-22 NOTE — Progress Notes (Signed)
Spoke with pt and notified of results per Dr. Wert. Pt verbalized understanding and denied any questions. 

## 2018-02-22 NOTE — Progress Notes (Signed)
Subjective:     Patient ID: Justin Walters, male   DOB: 10-20-1940,     MRN: [id number]    Brief patient profile:  5 yowm MM quit smoking 1995 with cough/wheeze/ sob and never recovered with documented GOLD III copd 02/23/2017      History of Present Illness  01/25/2017 1st  office visit/    GOLD III with reversibility  Chief Complaint  Patient presents with  . Pulmonary Consult    Referred by the hospital. He c/o SOB and coughing for the past several wks. His cough is occ prod, but he is unsure of sputum color. He is SOB with exertion and also when he lies down.  He gets winded walking approx 100 yards. He is using his albuterol inhaler 3 x per wk on average.   baseline doe across the street to son's house  nl pace and then onset   x one year gradually worse doe and no better on inhalers but poor baseline hfa/ assoc with cough / sense of chest congestion and has to prop up at 30 degrees now at hs to get comfortable Present doe = MMRC3 = can't walk 100 yards even at a slow pace at a flat grade s stopping due to sob  Easily confused with details of care/ names of meds / not using spiriva/ was better on pred and worse since tapered rec Plan A = Automatic =  symbicort 160 Take 2 puffs first thing in am and then another 2 puffs about 12 hours later.  Work on inhaler technique:  Plan B = Backup Only use your albuterol  as a rescue  Prednisone 10 mg take  4 each am x 2 days,   2 each am x 2 days,  1 each am x 2 days and stop     02/23/2017  f/u ov/ re:  GOLD III copd / no 02 Chief Complaint  Patient presents with  . Follow-up    PFT's today.  Breathing has improved some. He is rarely using his albuterol inhaler. He has been feeling fatigued.    better p prednisone then gradually worse  Dyspnea:  MMRC2 = can't walk a nl pace on a flat grade s sob but does fine slow and flat  Cough: none Sleep: ok 30 degrees no am congestion rec Add tudorza one puff after symbicort in am and in pm   Work on inhaler technique:      05/24/2017  f/u ov/ re:  Copd III/ no 02  Just on maint symb 160 / could not afford tudorza and not sure it helped doe  Chief Complaint  Patient presents with  . Follow-up    Breathing is doing well. He rarely uses his albuterol inhaler. No new co's.   Dyspnea:  MMRC2 = can't walk a nl pace on a flat grade s sob but does fine slow pace pretty much anywhere he wants to go  Cough: none Sleep: ok flat SABA use:  Rare  Hb every hs x sev months / resolves p about an hour, better p sit up / no assoc dysphagia or daytime symptoms/ varies some with how early he eats supper and how much, not sure what type meals make it worse  rec GERD diet  If not better add pepcid ac 20 mg one hour before bed > did not  Do   10/31/17 NP eval for 3-4 weeks worse sob/ cough rec Xopenex in office  Depo 80 Prednisone 10mg  tablet  >>>  4 tabs for 2 days, then 3 tabs for 2 days, 2 tabs for 2 days, then 1 tab for 2 days, then sto Azithromycin 250mg  tablet  >>>Take 2 tablets (500mg  total) today, and then 1 tablet (250mg ) for the next four days      11/21/2017 acute extended ov/ re: refractory cough x early oct 2019  / no change sob  Chief Complaint  Patient presents with  . Acute Visit    pt c/o sinus congestion, pnd, prod cough with white mucus X1 month.  has had 2 rounds of pred and doxy with little relief.   dyspnea : MMRC2 = can't walk a nl pace on a flat grade s sob but does fine slow and flat= baseline when not coughing  Cough > white mucus  not worse in am's/ harsh daytime coughing fts Sleeping flat two pillow saba  Not at all now rec Plan A = Automatic = symbicort 160 Take 2 puffs first thing in am and then another 2 puffs about 12 hours later.  Whenever your cough flares > try  Try prilosec otc 20mg   Take 30-60 min before first meal of the day and Pepcid ac (famotidine) 20 mg one after hour after supper or at bedtime until cough is completely gone for at  least a week without the need for cough suppression Plan B = Backup Only use your albuterol inhaler as a rescue medication  goes up over your usual need - Don't leave home without it !!  (think of it like the spare tire for your car)  For cough Mucinex dm 1200 mg every 12 hours and the tylenol #3  Up to every 4 hours if need for persistent irritating coughing  GERD diet    12/05/2017 acute extended ov/ re: cough and sob  Chief Complaint  Patient presents with  . Acute Visit    Increased SOB, cough and chest congestion x 1 month. Sputum is white to yellow. He is using his albuterol inhaler 2 x per wk on average.   cough is worse p supper and fine on back flat bed/ no blocks  In am feels fine but groggy p benadryl - can't  sleep s it W/in 20 min of rising lots of pnds > white mucus  Not using mucinex dm / reversing ppi and h2  Technique on symb poor and not using saba correctly in terms or when to use  rec Augmentin 875 mg take one pill twice daily  X 10 days - take at breakfast and supper with large glass of water.  .  Take prednisone  4 for three days 3 for three days 2 for three days 1 for three days and stop  Omeprazole 20 mg Take 30- 60 min before your first and last meals of the day and continue famotidine 20 mg at bedtime Bed blocks would be a great idea Try off zquil and tylenol #3  Up to 2 at bedtime For cough > mucinex dm 1200 mg every 12 hours and use the flutter valve as much as possible and if still can't stop coughing then use the tylenol #3        12/20/2017  f/u ov/ re:  Cough better, doe not quite baseline on symb 160 2bid  No worse off pred Chief Complaint  Patient presents with  . Follow-up    Breathing has improved some but is not back to his normal baseline. He rarely uses his albuterol inhaler.   Dyspnea:  MMRC2 = can't walk a nl pace on a flat grade s sob but does fine slow and flat  Cough: better/ no mucus production at all  Sleeping: ok flat  SABA  use: rarely 02: none   rec Bed blocks 6-8 inches  For cough > Mucinex dm up to 1200 mg twice daily and use the flutter valve as much as you can Gabapentin should be three times daily  Omeprazole is Take 30-60 min before first meal of the day and pepcid ac 20 mg after supper     01/25/2018 acute extended ov/ re:  Copd III / stopped symb too expensive, does better with nebs / overall not doing well since early Oct 2019  Chief Complaint  Patient presents with  . Acute Visit    SOB, runny nose, ear pain, wheezing, cough- white mucus at times, denies fever   brought meds/ not using ppi as instructed and ? If really using flutter, doesn't think it helps but did not bring it as rec to show him how to use/ using halls cough drops with menthol which was discouraged last ov/ gradually worse doe/ subj wheeze x sev weeks no better with hfa but technique poor/ no purulent mucus/ cough worse at hs and in am. No better on prednisone  rec Duoneb (albuterol and atrovent)  4 x daily Only use your albuterol as a rescue medication   Work on inhaler technique:  Be sure you take prilosec Take 30- 60 min before your first and last meals of the day  Change your appt to  - add sinus ct / hrct chest if still coughing on return   02/22/2018  f/u ov/ re: COPD  GOLD III maint on duoneb tid  Chief Complaint  Patient presents with  . Follow-up    Increased SOB, wheezing, and cough- non over the past 3-4 wks. He is using his albuterol inhaler 3-4 x per day and neb 3 x per day.   Dyspnea:  Still does [employer] with hc parking no 02  Cough: p supper is the worst but never prod Sleeping: bed blocks/ wakes up tight in chest around 2 am p last neb at 7 pm  SABA use: as above  02: none    No obvious day to day or daytime variability or assoc excess/ purulent sputum or mucus plugs or hemoptysis or cp or chest tightness,   or overt sinus or hb symptoms.    Also denies any obvious fluctuation of symptoms with  weather or environmental changes or other aggravating or alleviating factors except as outlined above   No unusual exposure hx or h/o childhood pna/ asthma or knowledge of premature birth.  Current Allergies, Complete Past Medical History, Past Surgical History, Family History, and Social History were reviewed in [employer] record.   ROS  The following are not active complaints unless bolded Hoarseness, sore throat, dysphagia, dental problems, itching, sneezing,  nasal congestion or discharge of excess mucus or purulent secretions, ear ache,   fever, chills, sweats, unintended wt loss or wt gain, classically pleuritic or exertional cp,  orthopnea pnd or arm/hand swelling  or leg swelling, presyncope, palpitations, abdominal pain, anorexia, nausea, vomiting, diarrhea  or change in bowel habits or change in bladder habits, change in stools or change in urine, dysuria, hematuria,  rash, arthralgias, visual complaints, headache, numbness, weakness or ataxia or problems with walking or coordination,  change in mood or  memory.  Current Meds  Medication Sig  . albuterol (PROVENTIL HFA;VENTOLIN HFA) 108 (90 Base) MCG/ACT inhaler INHALE 2 PUFFS INTO THE LUNGS EVERY 6 (SIX) HOURS AS NEEDED FOR WHEEZING OR SHORTNESS OF BREATH.  Marland Kitchen amLODipine (NORVASC) 5 MG tablet TAKE 1 TABLET EVERY DAY  . aspirin 81 MG EC tablet Take 1 tablet (81 mg total) by mouth daily. Swallow whole.  . carvedilol (COREG) 12.5 MG tablet TAKE 1 TABLET TWICE DAILY WITH MEALS  . docusate sodium (COLACE) 100 MG capsule Take 100 mg by mouth daily as needed for mild constipation.  . famotidine (PEPCID) 20 MG tablet Take 20 mg by mouth at bedtime.  . gabapentin (NEURONTIN) 300 MG capsule TAKE 1 CAPSULE THREE TIMES DAILY  . Guaifenesin (MUCINEX MAXIMUM STRENGTH) 1200 MG TB12 Take 1 tablet by mouth 2 (two) times daily.  Marland Kitchen ipratropium-albuterol (DUONEB) 0.5-2.5 (3) MG/3ML SOLN Use one vial 4 x daily per nebulizer   . Lactobacillus (PROBIOTIC ACIDOPHILUS PO) Take 1 tablet by mouth daily.  Marland Kitchen omeprazole (PRILOSEC) 20 MG capsule Take 30- 60 min before your first and last meals of the day  . Respiratory Therapy Supplies (FLUTTER) DEVI Use as directed  . rosuvastatin (CRESTOR) 20 MG tablet TAKE 1 TABLET EVERY DAY (Patient taking differently: Take 20 mg by mouth daily. )  . terazosin (HYTRIN) 10 MG capsule TAKE 1 CAPSULE AT BEDTIME                  Objective:   Physical Exam  amb wm chronically not acutely ill    02/22/2018        01/25/2018       156  12/05/2017       157  11/21/2017     157  05/24/2017        164    01/25/17 150 lb 12.8 oz (68.4 kg)  01/18/17 150 lb (68 kg)  01/08/17 150 lb (68 kg)      Vital signs reviewed - Note on arrival 02 sats  88% on RA     HEENT: nl dentition / oropharynx. Nl external ear canals without cough reflex -  Mild bilateral non-specific turbinate edema     NECK :  without JVD/Nodes/TM/ nl carotid upstrokes bilaterally   LUNGS: no acc muscle use,  Mod barrel  contour chest wall with bilateral    insp/exp rhonchi  without cough on insp or exp maneuver and mod  Hyperresonant  to  percussion bilaterally     CV:  RRR  no s3 or murmur or increase in P2, and no edema   ABD:  soft and nontender with pos mid insp Hoover's  in the supine position. No bruits or organomegaly appreciated, bowel sounds nl  MS:   Nl gait/  ext warm without deformities, calf tenderness, cyanosis or clubbing No obvious joint restrictions   SKIN: warm and dry without lesions    NEURO:  alert, approp, nl sensorium with  no motor or cerebellar deficits apparent.               CXR PA and Lateral:   02/22/2018 :    I personally reviewed images and agree with radiology impression as follows:   Multifocal pneumonia superimposed on chronic changes.   Labs ordered/ reviewed:     Chemistry      Component Value Date/Time   NA 139 02/22/2018 1055   K 4.6 02/22/2018 1055   CL 101  02/22/2018 1055   CO2 29 02/22/2018 1055  BUN 19 02/22/2018 1055   CREATININE 1.38 02/22/2018 1055      Component Value Date/Time   CALCIUM 9.0 02/22/2018 1055                                  Lab Results  Component Value Date   WBC 12.9 (H) 02/22/2018   HGB 11.0 (L) 02/22/2018   HCT 33.9 (L) 02/22/2018   MCV 88.8 02/22/2018   PLT 321.0 02/22/2018       EOS                                                               0.4                                    02/22/2018   Lab Results  Component Value Date   DDIMER 1.55 (H) 02/22/2018      Lab Results  Component Value Date   TSH 1.67 02/22/2018     Lab Results  Component Value Date   PROBNP 137.0 (H) 02/22/2018            Assessment:

## 2018-02-22 NOTE — Telephone Encounter (Signed)
Per Clemmie Krill at [facility], they are out of network with [facility] so they cannot provide pt O2 as ordered.  Clemmie Krill has faxed the order to [facility] as they are in network with [employer].    pcc note updated to reflect this.  Nothing further needed at this time- will close encounter.

## 2018-02-22 NOTE — Patient Instructions (Addendum)
Plan A = Automatic = Symbicort 160 Take 2 puffs first thing in am and then another 2 puffs about 12 hours later and duoneb four times    Work on inhaler technique:  relax and gently blow all the way out then take a nice smooth deep breath back in, triggering the inhaler at same time you start breathing in.  Hold for up to 5 seconds if you can. Blow out thru nose. Rinse and gargle with water when done   Plan B = Backup Only use your albuterol inhaler as a rescue medication to be used if you can't catch your breath by resting or doing a relaxed purse lip breathing pattern.  - The less you use it, the better it will work when you need it. - Ok to use the inhaler up to 2 puffs  every 4 hours if you must but call for appointment if use goes up over your usual need - Don't leave home without it !!  (think of it like the spare tire for your car)   Prednisone 10 mg  Take 4 for three days 3 for three days 2 for three days 1 for three days and stop   Please see patient coordinator before you leave today  to schedule 02 2lpm 24/7 for now  Please remember to go to the  x-ray department  for your tests - we will call you with the results when they are available     Please schedule a follow up office visit in 2 weeks, sooner if needed  with all medications /inhalers/ solutions in hand so we can verify exactly what you are taking. This includes all medications from all doctors and over the counters and bring your DRUG FORMULARY  - consider change coreg to bisoprolol or at least bystolic samples short run

## 2018-02-24 ENCOUNTER — Encounter: Payer: Self-pay | Admitting: Internal Medicine

## 2018-02-24 DIAGNOSIS — R918 Other nonspecific abnormal finding of lung field: Secondary | ICD-10-CM | POA: Insufficient documentation

## 2018-02-24 NOTE — Assessment & Plan Note (Signed)
No evidence of anemia/ chf/ thyroid dz  D dimer is elevated but this is non-specific and would not explain diffuse pulmonary infiltrates > see sep a/p

## 2018-02-24 NOTE — Assessment & Plan Note (Deleted)
01/25/2017 Patient Saturations on Room Air at Rest = 88% Patient Saturations on [employer] while Ambulating = 86% Patient Saturations on 2 Liters of oxygen while Ambulating = 91% - 02/22/2018 RA sats 88% at rest > resolved on 2lpm rec 24/7 02   He already qualified for amb 02 but is chronically ill and his gas exchange has deteriorated to where he now needs 24/7 02 2lpm

## 2018-02-24 NOTE — Assessment & Plan Note (Signed)
First detected 02/22/2018 vs baseline study 11/21/17   Unfortunately the cxr changes correlate to a chronic, not acute, process with ddx:  Miscellaneous:Alv microlithiasis, alv proteinosis, asp, bronchiectais, BOOP  > HRCT next ov if not improved   ARDS/ AIP Occupational dz/ HSP Neoplasm > needs CT next if no improvement  Infection>  rx levaquin 500 mg qid then return for cxr/ esr  Drug . None of the usual suspects listed  Pulmonary emboli, Protein disorders> d dimer is elevated but the power here is it's neg pred value and can be explained by other findings Edema/Eosinophilic dz> note Eos 0.4  > rx pred x 6 days only then regroup in 2 weeks Sarcoidosis Connective tissue dz > needs esr/ collagen vasc sreen  next ov and perhaps rheum w/u  Hist X  Rare in pt not actively smoking  Hemorrhage no drop in hgb Idiopathic  Dx of exclusion   I had an extended discussion with the patient reviewing all relevant studies completed to date and  lasting 25 minutes of a 40  minute office  visit addressing  non-specific but potentially very serious refractory respiratory symptoms of uncertain and potentially multiple  etiologies.   Work on inhaler technique:  relax and gently blow all the way out then take a nice smooth deep breath back in, triggering the inhaler at same time you start breathing in.  Hold for up to 5 seconds if you can. Blow out thru nose. Rinse and gargle with water when done       Each maintenance medication was reviewed in detail including most importantly the difference between maintenance and prns and under what circumstances the prns are to be triggered using an action plan format that is not reflected in the computer generated alphabetically organized AVS.    Please see AVS for specific instructions unique to this office visit that I personally wrote and verbalized to the the pt in detail and then reviewed with pt  by my nurse highlighting any changes in therapy/plan of care   recommended at today's visit.

## 2018-02-24 NOTE — Assessment & Plan Note (Signed)
01/25/2017 Patient Saturations on Room Air at Rest = 88% Patient Saturations on [employer] while Ambulating = 86% Patient Saturations on 2 Liters of oxygen while Ambulating = 91% - 02/22/2018 RA sats 88% at rest > resolved on 2lpm rec 24/7 02   He already qualified for amb 02 but is chronically ill and his gas exchange has deteriorated to where he now needs 24/7 02 2lpm

## 2018-02-24 NOTE — Assessment & Plan Note (Addendum)
Quit smoking 1995 PFT's  01/20/2017  FEV1 1.05 (39 % ) ratio 54  p 18 % improvement from saba p ? prior to study with DLCO  50/53  % corrects to 77 % for alv volume  - 01/25/2017   rec increase symb to 160 2bid trial  - alpha one AT screening  01/18/17   MM  Level 190 PFT's  02/23/2017  FEV1 1.17 (44 % ) ratio 54  p 0 % improvement from saba p symb 160 prior to study with DLCO   39 % corrects to 59 % for alv volume  - 02/23/2017    add tudorza trial > no change and could not afford it  - 12/05/2017 flutter valve added    - 02/22/2018  After extensive coaching inhaler device,  effectiveness =    75% > restart symb 160  2bid and Pred x 6days and recheck in 2 weeks    It turns out symb was helping but too expensive so will rx with 2 week sample then return @ 2 weeks to regroup ? Needs different bp med  - In the setting of respiratory symptoms of unknown etiology,  It would be preferable to use bystolic, the most beta -1  selective Beta blocker available in sample form, with bisoprolol the most selective generic choice  on the market, at least on a trial basis, to make sure the spillover Beta 2 effects of the less specific Beta blockers are not contributing to this patient's symptoms.    Advised:  formulary restrictions will be an ongoing challenge for the forseable future and I would be happy to pick an alternative if the pt will first  provide me a list of them -  pt  will need to return here for training for any new device that is required eg dpi vs hfa vs respimat.    In the meantime we can always provide samples so that the patient never runs out of any needed respiratory medications.

## 2018-03-01 DIAGNOSIS — J449 Chronic obstructive pulmonary disease, unspecified: Secondary | ICD-10-CM | POA: Diagnosis not present

## 2018-03-08 ENCOUNTER — Ambulatory Visit (INDEPENDENT_AMBULATORY_CARE_PROVIDER_SITE_OTHER)
Admission: RE | Admit: 2018-03-08 | Discharge: 2018-03-08 | Disposition: A | Discharge status: Home / Self Care | Payer: Medicare HMO | Source: Ambulatory Visit | Attending: Internal Medicine | Admitting: Internal Medicine

## 2018-03-08 ENCOUNTER — Ambulatory Visit: Payer: Medicare HMO | Admitting: Internal Medicine

## 2018-03-08 ENCOUNTER — Encounter: Payer: Self-pay | Admitting: Internal Medicine

## 2018-03-08 VITALS — BP 118/62 | HR 96 | Ht 67.0 in | Wt 149.0 lb

## 2018-03-08 DIAGNOSIS — J9611 Chronic respiratory failure with hypoxia: Secondary | ICD-10-CM

## 2018-03-08 DIAGNOSIS — R918 Other nonspecific abnormal finding of lung field: Secondary | ICD-10-CM | POA: Diagnosis not present

## 2018-03-08 DIAGNOSIS — J189 Pneumonia, unspecified organism: Secondary | ICD-10-CM | POA: Diagnosis not present

## 2018-03-08 DIAGNOSIS — J449 Chronic obstructive pulmonary disease, unspecified: Secondary | ICD-10-CM | POA: Diagnosis not present

## 2018-03-08 MED ORDER — UMECLIDINIUM BROMIDE 62.5 MCG/INH IN AEPB: 1.0000 | INHALATION_SPRAY | Freq: Every day | RESPIRATORY_TRACT | 0 refills | Status: DC

## 2018-03-08 MED ORDER — FLUTICASONE FUROATE-VILANTEROL 100-25 MCG/INH IN AEPB: 1.0000 | INHALATION_SPRAY | Freq: Every day | RESPIRATORY_TRACT | 0 refills | Status: DC

## 2018-03-08 MED ORDER — ALBUTEROL SULFATE (2.5 MG/3ML) 0.083% IN NEBU: 2.5000 mg | INHALATION_SOLUTION | RESPIRATORY_TRACT | 11 refills | Status: DC | PRN

## 2018-03-08 NOTE — Progress Notes (Signed)
Subjective:     Patient ID: Justin Walters, male   DOB: 10-20-1940,     MRN: [id number]    Brief patient profile:  5 yowm MM quit smoking 1995 with cough/wheeze/ sob and never recovered with documented GOLD III copd 02/23/2017      History of Present Illness  01/25/2017 1st  office visit/ Alys Dulak   GOLD III with reversibility  Chief Complaint  Patient presents with  . Pulmonary Consult    Referred by the hospital. He c/o SOB and coughing for the past several wks. His cough is occ prod, but he is unsure of sputum color. He is SOB with exertion and also when he lies down.  He gets winded walking approx 100 yards. He is using his albuterol inhaler 3 x per wk on average.   baseline doe across the street to son's house  nl pace and then onset   x one year gradually worse doe and no better on inhalers but poor baseline hfa/ assoc with cough / sense of chest congestion and has to prop up at 30 degrees now at hs to get comfortable Present doe = MMRC3 = can't walk 100 yards even at a slow pace at a flat grade s stopping due to sob  Easily confused with details of care/ names of meds / not using spiriva/ was better on pred and worse since tapered rec Plan A = Automatic =  symbicort 160 Take 2 puffs first thing in am and then another 2 puffs about 12 hours later.  Work on inhaler technique:  Plan B = Backup Only use your albuterol  as a rescue  Prednisone 10 mg take  4 each am x 2 days,   2 each am x 2 days,  1 each am x 2 days and stop     02/23/2017  f/u ov/Toby Breithaupt re:  GOLD III copd / no 02 Chief Complaint  Patient presents with  . Follow-up    PFT's today.  Breathing has improved some. He is rarely using his albuterol inhaler. He has been feeling fatigued.    better p prednisone then gradually worse  Dyspnea:  MMRC2 = can't walk a nl pace on a flat grade s sob but does fine slow and flat  Cough: none Sleep: ok 30 degrees no am congestion rec Add tudorza one puff after symbicort in am and in pm   Work on inhaler technique:      05/24/2017  f/u ov/Neil Errickson re:  Copd III/ no 02  Just on maint symb 160 / could not afford tudorza and not sure it helped doe  Chief Complaint  Patient presents with  . Follow-up    Breathing is doing well. He rarely uses his albuterol inhaler. No new co's.   Dyspnea:  MMRC2 = can't walk a nl pace on a flat grade s sob but does fine slow pace pretty much anywhere he wants to go  Cough: none Sleep: ok flat SABA use:  Rare  Hb every hs x sev months / resolves p about an hour, better p sit up / no assoc dysphagia or daytime symptoms/ varies some with how early he eats supper and how much, not sure what type meals make it worse  rec GERD diet  If not better add pepcid ac 20 mg one hour before bed > did not  Do   10/31/17 NP eval for 3-4 weeks worse sob/ cough rec Xopenex in office  Depo 80 Prednisone 10mg  tablet  >>>  4 tabs for 2 days, then 3 tabs for 2 days, 2 tabs for 2 days, then 1 tab for 2 days, then sto Azithromycin 250mg  tablet  >>>Take 2 tablets (500mg  total) today, and then 1 tablet (250mg ) for the next four days      11/21/2017 acute extended ov/Welby Montminy re: refractory cough x early oct 2019  / no change sob  Chief Complaint  Patient presents with  . Acute Visit    pt c/o sinus congestion, pnd, prod cough with white mucus X1 month.  has had 2 rounds of pred and doxy with little relief.   dyspnea : MMRC2 = can't walk a nl pace on a flat grade s sob but does fine slow and flat= baseline when not coughing  Cough > white mucus  not worse in am's/ harsh daytime coughing fts Sleeping flat two pillow saba  Not at all now rec Plan A = Automatic = symbicort 160 Take 2 puffs first thing in am and then another 2 puffs about 12 hours later.  Whenever your cough flares > try  Try prilosec otc 20mg   Take 30-60 min before first meal of the day and Pepcid ac (famotidine) 20 mg one after hour after supper or at bedtime until cough is completely gone for at  least a week without the need for cough suppression Plan B = Backup Only use your albuterol inhaler as a rescue medication  goes up over your usual need - Don't leave home without it !!  (think of it like the spare tire for your car)  For cough Mucinex dm 1200 mg every 12 hours and the tylenol #3  Up to every 4 hours if need for persistent irritating coughing  GERD diet      12/05/2017 acute extended ov/Joleigh Mineau re: cough and sob worse since oc 2019 Chief Complaint  Patient presents with  . Acute Visit    Increased SOB, cough and chest congestion x 1 month. Sputum is white to yellow. He is using his albuterol inhaler 2 x per wk on average.   cough is worse p supper and fine on back with  flat bed/ no blocks  In am feels fine but groggy p benadryl - can't  sleep s it W/in 20 min of rising lots of pnds > white mucus  Not using mucinex dm / reversing ppi and h2  Technique on symb poor and not using saba correctly in terms or when to use  rec Augmentin 875 mg take one pill twice daily  X 10 days - take at breakfast and supper with large glass of water.  .  Take prednisone  4 for three days 3 for three days 2 for three days 1 for three days and stop  Omeprazole 20 mg Take 30- 60 min before your first and last meals of the day and continue famotidine 20 mg at bedtime Bed blocks would be a great idea Try off zquil and tylenol #3  Up to 2 at bedtime For cough > mucinex dm 1200 mg every 12 hours and use the flutter valve as much as possible and if still can't stop coughing then use the tylenol #3        12/20/2017  f/u ov/Toney Difatta re:  Cough better, doe not quite baseline on symb 160 2bid  No worse off pred Chief Complaint  Patient presents with  . Follow-up    Breathing has improved some but is not back to his normal baseline. He rarely uses  his albuterol inhaler.   Dyspnea:  MMRC2 = can't walk a nl pace on a flat grade s sob but does fine slow and flat  Cough: better/ no mucus production at all   Sleeping: ok flat  SABA use: rarely 02: none   rec Bed blocks 6-8 inches  For cough > Mucinex dm up to 1200 mg twice daily and use the flutter valve as much as you can Gabapentin should be three times daily  Omeprazole is Take 30-60 min before first meal of the day and pepcid ac 20 mg after supper     01/25/2018 acute extended ov/Makell Cyr re:  Copd III / stopped symb too expensive, does better with nebs / overall not doing well since early Oct 2019  Chief Complaint  Patient presents with  . Acute Visit    SOB, runny nose, ear pain, wheezing, cough- white mucus at times, denies fever   brought meds/ not using ppi as instructed and ? If really using flutter, doesn't think it helps but did not bring it as rec to show him how to use/ using halls cough drops with menthol which was discouraged last ov/ gradually worse doe/ subj wheeze x sev weeks no better with hfa but technique poor/ no purulent mucus/ cough worse at hs and in am. No better on prednisone  rec Duoneb (albuterol and atrovent)  4 x daily Only use your albuterol as a rescue medication   Work on inhaler technique:  Be sure you take prilosec Take 30- 60 min before your first and last meals of the day  Change your appt to  - add sinus ct / hrct chest if still coughing on return      02/22/2018  f/u ov/Jahziah Simonin re: COPD  GOLD III maint on duoneb tid  Chief Complaint  Patient presents with  . Follow-up    Increased SOB, wheezing, and cough- non over the past 3-4 wks. He is using his albuterol inhaler 3-4 x per day and neb 3 x per day.   Dyspnea:  Still does [employer] with hc parking no 02  Cough: p supper is the worst but never prod Sleeping: bed blocks/ wakes up tight in chest around 2 am p last neb at 7 pm  SABA use: as above  rec Plan A = Automatic = Symbicort 160 Take 2 puffs first thing in am and then another 2 puffs about 12 hours later and duoneb four times   Work on inhaler technique:   Plan B = Backup Only use your  albuterol inhaler as a rescue medication Prednisone 10 mg  Take 4 for three days 3 for three days 2 for three days 1 for three days and stop  Please see patient coordinator before you leave today  to schedule 02 2lpm 24/7 for now Please remember to go to the  x-ray department  for your tests - we will call you with the results when they are available   Please schedule a follow up office visit in 2 weeks, sooner if needed  with all medications /inhalers/ solutions in hand so we can verify exactly what you are taking. This includes all medications from all doctors and over the counters and bring your DRUG FORMULARY  - consider change coreg to bisoprolol or at least bystolic samples short run  - add levaquin 500 mg x 10 d for ? pna    03/08/2018  f/u ov/Janissa Bertram re:  COPD  III  On symb and duoneb qid  Chief Complaint  Patient presents with  . Follow-up    Breathing has improved back to baseline. He is not using his albuterol inhaler but is using his duoneb 4 x daily.   Dyspnea:  Better, usually does [employer] leaning on cart s 02  Cough: none now Sleeping: bed blocks  SABA use: none extra in hfa form  02: 2lpm at hs not with activity    No obvious day to day or daytime variability or assoc excess/ purulent sputum or mucus plugs or hemoptysis or cp or chest tightness, subjective wheeze or overt sinus or hb symptoms.   Sleeping fine as above  without nocturnal  or early am exacerbation  of respiratory  c/o's or need for noct saba. Also denies any obvious fluctuation of symptoms with weather or environmental changes or other aggravating or alleviating factors except as outlined above   No unusual exposure hx or h/o childhood pna/ asthma or knowledge of premature birth.  Current Allergies, Complete Past Medical History, Past Surgical History, Family History, and Social History were reviewed in [employer] record.  ROS  The following are not active complaints unless  bolded Hoarseness, sore throat, dysphagia, dental problems, itching, sneezing,  nasal congestion or discharge of excess mucus or purulent secretions, ear ache,   fever, chills, sweats, unintended wt loss or wt gain, classically pleuritic or exertional cp,  orthopnea pnd or arm/hand swelling  or leg swelling, presyncope, palpitations, abdominal pain, anorexia, nausea, vomiting, diarrhea  or change in bowel habits or change in bladder habits, change in stools or change in urine, dysuria, hematuria,  rash, arthralgias, visual complaints, headache, numbness, weakness or ataxia or problems with walking or coordination,  change in mood or  memory.        Current Meds  Medication Sig  . albuterol (PROVENTIL HFA;VENTOLIN HFA) 108 (90 Base) MCG/ACT inhaler INHALE 2 PUFFS INTO THE LUNGS EVERY 6 (SIX) HOURS AS NEEDED FOR WHEEZING OR SHORTNESS OF BREATH.  Marland Kitchen amLODipine (NORVASC) 5 MG tablet TAKE 1 TABLET EVERY DAY  . aspirin 81 MG EC tablet Take 1 tablet (81 mg total) by mouth daily. Swallow whole.  . budesonide-formoterol (SYMBICORT) 160-4.5 MCG/ACT inhaler Inhale 2 puffs into the lungs 2 (two) times daily.  . carvedilol (COREG) 12.5 MG tablet TAKE 1 TABLET TWICE DAILY WITH MEALS  . docusate sodium (COLACE) 100 MG capsule Take 100 mg by mouth daily as needed for mild constipation.  . famotidine (PEPCID) 20 MG tablet Take 20 mg by mouth at bedtime.  . gabapentin (NEURONTIN) 300 MG capsule TAKE 1 CAPSULE THREE TIMES DAILY  . Guaifenesin (MUCINEX MAXIMUM STRENGTH) 1200 MG TB12 Take 1 tablet by mouth 2 (two) times daily.  Marland Kitchen ipratropium-albuterol (DUONEB) 0.5-2.5 (3) MG/3ML SOLN Use one vial 4 x daily per nebulizer  . Lactobacillus (PROBIOTIC ACIDOPHILUS PO) Take 1 tablet by mouth daily.  Marland Kitchen omeprazole (PRILOSEC) 20 MG capsule Take 30- 60 min before your first and last meals of the day  . OXYGEN 2 lpm with sleep and exertion if needed [facility]  . Respiratory Therapy Supplies (FLUTTER) DEVI Use as directed   . rosuvastatin (CRESTOR) 20 MG tablet TAKE 1 TABLET EVERY DAY (Patient taking differently: Take 20 mg by mouth daily. )  . terazosin (HYTRIN) 10 MG capsule TAKE 1 CAPSULE AT BEDTIME                  Objective:   Physical Exam  amb slt hoarse wm nad  03/08/2018         149   01/25/2018       156  12/05/2017       157  11/21/2017     157  05/24/2017        164    01/25/17 150 lb 12.8 oz (68.4 kg)  01/18/17 150 lb (68 kg)  01/08/17 150 lb (68 kg)       Vital signs reviewed - Note on arrival 02 sats  92% on RA        HEENT: nl   oropharynx. Nl external ear canals without cough reflex -  Mild bilateral non-specific turbinate edema     NECK :  without JVD/Nodes/TM/ nl carotid upstrokes bilaterally   LUNGS: no acc muscle use,  Mod barrel  contour chest wall with bilateral  Distant bs s audible wheeze and  without cough on insp or exp maneuver and mod  Hyperresonant  to  percussion bilaterally     CV:  RRR  no s3 or murmur or increase in P2, and no edema   ABD:  soft and nontender with pos mid insp Hoover's  in the supine position. No bruits or organomegaly appreciated, bowel sounds nl  MS:   Nl gait/  ext warm without deformities, calf tenderness, cyanosis or clubbing No obvious joint restrictions   SKIN: warm and dry without lesions    NEURO:  alert, approp, nl sensorium with  no motor or cerebellar deficits apparent.           CXR PA and Lateral:   03/08/2018 :    I personally reviewed images and agree with radiology impression as follows:  Persistent diffuse bilateral patchy airspace opacities with peripheral predominance. Given the persistence of these findings since 02/22/2018, further evaluation with chest CT may be considered.                      Assessment:

## 2018-03-08 NOTE — Assessment & Plan Note (Addendum)
First detected 02/22/2018 vs baseline study 11/21/17 so rx levaquin 500 x 10 days  - f/u cxr 03/08/2018 mild improvement radiographically, much better clinically no longer desat at rest, no cough    At this point ? If there is some form of underlying ild or bronchiectatic change and HRCT may be considered but as this just may represent radiographic lag I rec wait until f/u to decide whether to pursue additional studies > if condition worsens again then go ahead with hrct  Discussed in detail all the  indications, usual  risks and alternatives  relative to the benefits with patient who agrees to proceed with w/u as outlined.      I had an extended discussion with the patient reviewing all relevant studies completed to date and  lasting 15 to 20 minutes of a 25 minute visit    See device teaching which extended face to face time for this visit as did directly observing portions of   amb 02 saturation study.   Each maintenance medication was reviewed in detail including emphasizing most importantly the difference between maintenance and prns and under what circumstances the prns are to be triggered using an action plan format that is not reflected in the computer generated alphabetically organized AVS which I have not found useful in most complex patients, especially with respiratory illnesses  Please see AVS for specific instructions unique to this visit that I personally wrote and verbalized to the the pt in detail and then reviewed with pt  by my nurse highlighting any  changes in therapy recommended at today's visit to their plan of care.

## 2018-03-08 NOTE — Patient Instructions (Addendum)
Plan A = Automatic = Incruse/BREO  one click eam am and continue 02 2 lpm at bedtime and wear it  as needed during the day to keep your sats > 90% with walking or go slower  Plan B = Backup Only use your albuterol inhaler as a rescue medication to be used if you can't catch your breath by resting or doing a relaxed purse lip breathing pattern.  - The less you use it, the better it will work when you need it. - Ok to use the inhaler up to 2 puffs  every 4 hours if you must but call for appointment if use goes up over your usual need - Don't leave home without it !!  (think of it like the spare tire for your car)   Plan C = Crisis - only use your albuterol nebulizer(not your ipatropium)  if you first try Plan B and it fails to help > ok to use the nebulizer up to every 4 hours but if start needing it regularly call for immediate appointment   Please remember to go to the  x-ray department  for your tests - we will call you with the results when they are available     Please schedule a follow up office visit in 2 weeks with your formulary , sooner if needed  with all medications /inhalers/ solutions in hand so we can verify exactly what you are taking. This includes all medications from all doctors and over the counters (ok to see NP if I don't have opening )

## 2018-03-10 ENCOUNTER — Encounter: Payer: Self-pay | Admitting: Internal Medicine

## 2018-03-10 NOTE — Assessment & Plan Note (Signed)
Quit smoking 1995 PFT's  01/20/2017  FEV1 1.05 (39 % ) ratio 54  p 18 % improvement from saba p ? prior to study with DLCO  50/53  % corrects to 77 % for alv volume  - 01/25/2017   rec increase symb to 160 2bid trial  - alpha one AT screening  01/18/17   MM  Level 190 PFT's  02/23/2017  FEV1 1.17 (44 % ) ratio 54  p 0 % improvement from saba p symb 160 prior to study with DLCO   39 % corrects to 59 % for alv volume  - 02/23/2017    add tudorza trial > no change and could not afford - 12/05/2017 flutter valve added   - 02/22/2018  After extensive coaching inhaler device,  effectiveness =    75% > restart symb 160 2bid - 03/08/2018  After extensive coaching inhaler device,  effectiveness =    90% with elipta so try trelegy (no samples so gave 2 weeks of samples of breo 100/incruse)  And asked to return in 2 weeks with formulary    Clearly  Group D in terms of symptom/risk and laba/lama/ICS  therefore appropriate rx at this point >>>  trelegy best single choice if covered by insurance   Advised:  formulary restrictions will be an ongoing challenge for the forseable future and I would be happy to pick an alternative if the pt will first  provide me a list of them -  pt  will need to return here for training for any new device that is required eg dpi vs hfa vs respimat.    In the meantime we can always provide samples so that the patient never runs out of any needed respiratory medications.

## 2018-03-10 NOTE — Assessment & Plan Note (Signed)
01/25/2017 Patient Saturations on Room Air at Rest = 88% Patient Saturations on [employer] while Ambulating = 86% Patient Saturations on 2 Liters of oxygen while Ambulating = 91% - 02/22/2018 RA sats 88% at rest > resolved on 2lpm rec 24/7 02  - 03/08/2018   Walked RA  2 laps @  approx 247ft each @ slow pace  stopped due to  End of study / sats 88% p one lap and 84 p second so rec 2lpm with acitivities outside the house /titrate to keep > 90%    He has improved and no longer desats at rest but clearly needs it walking > 250 ft slowly / advised

## 2018-03-11 NOTE — Progress Notes (Signed)
Spoke with pt and notified of results per Dr. Melvyn Novas. Pt verbalized understanding and denied any questions. Pt states he is feeling much improved and wants to wait for 4 wk rov with cxr.

## 2018-03-23 DIAGNOSIS — J449 Chronic obstructive pulmonary disease, unspecified: Secondary | ICD-10-CM | POA: Diagnosis not present

## 2018-03-23 DIAGNOSIS — J9611 Chronic respiratory failure with hypoxia: Secondary | ICD-10-CM | POA: Diagnosis not present

## 2018-03-25 ENCOUNTER — Other Ambulatory Visit: Payer: Self-pay

## 2018-03-25 ENCOUNTER — Other Ambulatory Visit: Payer: Self-pay | Admitting: Internal Medicine

## 2018-03-25 ENCOUNTER — Ambulatory Visit: Payer: Medicare HMO | Admitting: Internal Medicine

## 2018-03-25 ENCOUNTER — Encounter: Payer: Self-pay | Admitting: Internal Medicine

## 2018-03-25 ENCOUNTER — Ambulatory Visit (INDEPENDENT_AMBULATORY_CARE_PROVIDER_SITE_OTHER): Payer: Medicare HMO

## 2018-03-25 VITALS — BP 90/54 | HR 72 | Temp 97.7°F | Ht 67.0 in | Wt 155.0 lb

## 2018-03-25 DIAGNOSIS — J9611 Chronic respiratory failure with hypoxia: Secondary | ICD-10-CM | POA: Diagnosis not present

## 2018-03-25 DIAGNOSIS — R918 Other nonspecific abnormal finding of lung field: Secondary | ICD-10-CM

## 2018-03-25 DIAGNOSIS — I10 Essential (primary) hypertension: Secondary | ICD-10-CM | POA: Diagnosis not present

## 2018-03-25 DIAGNOSIS — R0602 Shortness of breath: Secondary | ICD-10-CM

## 2018-03-25 DIAGNOSIS — J449 Chronic obstructive pulmonary disease, unspecified: Secondary | ICD-10-CM | POA: Diagnosis not present

## 2018-03-25 LAB — CBC WITH DIFFERENTIAL/PLATELET
BASOS ABS: 0.1 10*3/uL (ref 0.0–0.1)
Basophils Relative: 0.7 % (ref 0.0–3.0)
Eosinophils Absolute: 0.6 10*3/uL (ref 0.0–0.7)
Eosinophils Relative: 5.7 % — ABNORMAL HIGH (ref 0.0–5.0)
HCT: 31.6 % — ABNORMAL LOW (ref 39.0–52.0)
Hemoglobin: 10.3 g/dL — ABNORMAL LOW (ref 13.0–17.0)
Lymphocytes Relative: 9.3 % — ABNORMAL LOW (ref 12.0–46.0)
Lymphs Abs: 1.1 10*3/uL (ref 0.7–4.0)
MCHC: 32.5 g/dL (ref 30.0–36.0)
MCV: 87.1 fl (ref 78.0–100.0)
Monocytes Absolute: 1.2 10*3/uL — ABNORMAL HIGH (ref 0.1–1.0)
Monocytes Relative: 10.9 % (ref 3.0–12.0)
Neutro Abs: 8.3 10*3/uL — ABNORMAL HIGH (ref 1.4–7.7)
Neutrophils Relative %: 73.4 % (ref 43.0–77.0)
Platelets: 546 10*3/uL — ABNORMAL HIGH (ref 150.0–400.0)
RBC: 3.62 Mil/uL — ABNORMAL LOW (ref 4.22–5.81)
RDW: 15.3 % (ref 11.5–15.5)
WBC: 11.3 10*3/uL — ABNORMAL HIGH (ref 4.0–10.5)

## 2018-03-25 LAB — SEDIMENTATION RATE: SED RATE: 37 mm/h — AB (ref 0–20)

## 2018-03-25 MED ORDER — PREDNISONE 10 MG PO TABS: ORAL_TABLET | ORAL | 0 refills | Status: DC

## 2018-03-25 MED ORDER — UMECLIDINIUM-VILANTEROL 62.5-25 MCG/INH IN AEPB: 1.0000 | INHALATION_SPRAY | Freq: Every day | RESPIRATORY_TRACT | 0 refills | Status: DC

## 2018-03-25 MED ORDER — UMECLIDINIUM-VILANTEROL 62.5-25 MCG/INH IN AEPB: 1.0000 | INHALATION_SPRAY | Freq: Every day | RESPIRATORY_TRACT | 11 refills | Status: DC

## 2018-03-25 MED ORDER — PANTOPRAZOLE SODIUM 40 MG PO TBEC: 40.0000 mg | DELAYED_RELEASE_TABLET | Freq: Every day | ORAL | 2 refills | Status: DC

## 2018-03-25 NOTE — Assessment & Plan Note (Signed)
01/25/2017 Patient Saturations on Room Air at Rest = 88% Patient Saturations on [employer] while Ambulating = 86% Patient Saturations on 2 Liters of oxygen while Ambulating = 91% - 02/22/2018 RA sats 88% at rest > resolved on 2lpm rec 24/7 02  - 03/08/2018   Walked RA  2 laps @  approx 257ft each @ slow pace  stopped due to  End of study / sats 88% p one lap and 84 p second so rec 2lpm with acitivities outside the house /titrate to keep > 90%    - 03/25/2018 using 3lpm 24/7 while trying to work out why getting worse (see pulmonary infiltrates)   Goal is to keep sats > 90%

## 2018-03-25 NOTE — Progress Notes (Addendum)
Subjective:     Patient ID: Justin Walters, male   DOB: 10-20-1940,     MRN: [id number]    Brief patient profile:  5 yowm MM quit smoking 1995 with cough/wheeze/ sob and never recovered with documented GOLD III copd 02/23/2017      History of Present Illness  01/25/2017 1st  office visit/ [name]   GOLD III with reversibility  Chief Complaint  Patient presents with  . Pulmonary Consult    Referred by the hospital. He c/o SOB and coughing for the past several wks. His cough is occ prod, but he is unsure of sputum color. He is SOB with exertion and also when he lies down.  He gets winded walking approx 100 yards. He is using his albuterol inhaler 3 x per wk on average.   baseline doe across the street to son's house  nl pace and then onset   x one year gradually worse doe and no better on inhalers but poor baseline hfa/ assoc with cough / sense of chest congestion and has to prop up at 30 degrees now at hs to get comfortable Present doe = MMRC3 = can't walk 100 yards even at a slow pace at a flat grade s stopping due to sob  Easily confused with details of care/ names of meds / not using spiriva/ was better on pred and worse since tapered rec Plan A = Automatic =  symbicort 160 Take 2 puffs first thing in am and then another 2 puffs about 12 hours later.  Work on inhaler technique:  Plan B = Backup Only use your albuterol  as a rescue  Prednisone 10 mg take  4 each am x 2 days,   2 each am x 2 days,  1 each am x 2 days and stop     02/23/2017  f/u ov/[name] re:  GOLD III copd / no 02 Chief Complaint  Patient presents with  . Follow-up    PFT's today.  Breathing has improved some. He is rarely using his albuterol inhaler. He has been feeling fatigued.    better p prednisone then gradually worse  Dyspnea:  MMRC2 = can't walk a nl pace on a flat grade s sob but does fine slow and flat  Cough: none Sleep: ok 30 degrees no am congestion rec Add tudorza one puff after symbicort in am and in pm   Work on inhaler technique:      05/24/2017  f/u ov/[name] re:  Copd III/ no 02  Just on maint symb 160 / could not afford tudorza and not sure it helped doe  Chief Complaint  Patient presents with  . Follow-up    Breathing is doing well. He rarely uses his albuterol inhaler. No new co's.   Dyspnea:  MMRC2 = can't walk a nl pace on a flat grade s sob but does fine slow pace pretty much anywhere he wants to go  Cough: none Sleep: ok flat SABA use:  Rare  Hb every hs x sev months / resolves p about an hour, better p sit up / no assoc dysphagia or daytime symptoms/ varies some with how early he eats supper and how much, not sure what type meals make it worse  rec GERD diet  If not better add pepcid ac 20 mg one hour before bed > did not  Do   10/31/17 NP eval for 3-4 weeks worse sob/ cough rec Xopenex in office  Depo 80 Prednisone 10mg  tablet  >>>  4 tabs for 2 days, then 3 tabs for 2 days, 2 tabs for 2 days, then 1 tab for 2 days, then sto Azithromycin 250mg  tablet  >>>Take 2 tablets (500mg  total) today, and then 1 tablet (250mg ) for the next four days      11/21/2017 acute extended ov/[name] re: refractory cough x early oct 2019  / no change sob  Chief Complaint  Patient presents with  . Acute Visit    pt c/o sinus congestion, pnd, prod cough with white mucus X1 month.  has had 2 rounds of pred and doxy with little relief.   dyspnea : MMRC2 = can't walk a nl pace on a flat grade s sob but does fine slow and flat= baseline when not coughing  Cough > white mucus  not worse in am's/ harsh daytime coughing fts Sleeping flat two pillow saba  Not at all now rec Plan A = Automatic = symbicort 160 Take 2 puffs first thing in am and then another 2 puffs about 12 hours later.  Whenever your cough flares > try  Try prilosec otc 20mg   Take 30-60 min before first meal of the day and Pepcid ac (famotidine) 20 mg one after hour after supper or at bedtime until cough is completely gone for at  least a week without the need for cough suppression Plan B = Backup Only use your albuterol inhaler as a rescue medication  goes up over your usual need - Don't leave home without it !!  (think of it like the spare tire for your car)  For cough Mucinex dm 1200 mg every 12 hours and the tylenol #3  Up to every 4 hours if need for persistent irritating coughing  GERD diet      12/05/2017 acute extended ov/[name] re: cough and sob worse since oc 2019 Chief Complaint  Patient presents with  . Acute Visit    Increased SOB, cough and chest congestion x 1 month. Sputum is white to yellow. He is using his albuterol inhaler 2 x per wk on average.   cough is worse p supper and fine on back with  flat bed/ no blocks  In am feels fine but groggy p benadryl - can't  sleep s it W/in 20 min of rising lots of pnds > white mucus  Not using mucinex dm / reversing ppi and h2  Technique on symb poor and not using saba correctly in terms or when to use  rec Augmentin 875 mg take one pill twice daily  X 10 days - take at breakfast and supper with large glass of water.  .  Take prednisone  4 for three days 3 for three days 2 for three days 1 for three days and stop  Omeprazole 20 mg Take 30- 60 min before your first and last meals of the day and continue famotidine 20 mg at bedtime Bed blocks would be a great idea Try off zquil and tylenol #3  Up to 2 at bedtime For cough > mucinex dm 1200 mg every 12 hours and use the flutter valve as much as possible and if still can't stop coughing then use the tylenol #3        12/20/2017  f/u ov/[name] re:  Cough better, doe not quite baseline on symb 160 2bid  No worse off pred Chief Complaint  Patient presents with  . Follow-up    Breathing has improved some but is not back to his normal baseline. He rarely uses  his albuterol inhaler.   Dyspnea:  MMRC2 = can't walk a nl pace on a flat grade s sob but does fine slow and flat  Cough: better/ no mucus production at all   Sleeping: ok flat  SABA use: rarely 02: none   rec Bed blocks 6-8 inches  For cough > Mucinex dm up to 1200 mg twice daily and use the flutter valve as much as you can Gabapentin should be three times daily  Omeprazole is Take 30-60 min before first meal of the day and pepcid ac 20 mg after supper     01/25/2018 acute extended ov/Shimika Ames re:  Copd III / stopped symb too expensive, does better with nebs / overall not doing well since early Oct 2019  Chief Complaint  Patient presents with  . Acute Visit    SOB, runny nose, ear pain, wheezing, cough- white mucus at times, denies fever   brought meds/ not using ppi as instructed and ? If really using flutter, doesn't think it helps but did not bring it as rec to show him how to use/ using halls cough drops with menthol which was discouraged last ov/ gradually worse doe/ subj wheeze x sev weeks no better with hfa but technique poor/ no purulent mucus/ cough worse at hs and in am. No better on prednisone  rec Duoneb (albuterol and atrovent)  4 x daily Only use your albuterol as a rescue medication   Work on inhaler technique:  Be sure you take prilosec Take 30- 60 min before your first and last meals of the day  Change your appt to  - add sinus ct / hrct chest if still coughing on return      02/22/2018  f/u ov/Deshayla Empson re: COPD  GOLD III maint on duoneb tid  Chief Complaint  Patient presents with  . Follow-up    Increased SOB, wheezing, and cough- non over the past 3-4 wks. He is using his albuterol inhaler 3-4 x per day and neb 3 x per day.   Dyspnea:  Still does [employer] with hc parking no 02  Cough: p supper is the worst but never prod Sleeping: bed blocks/ wakes up tight in chest around 2 am p last neb at 7 pm  SABA use: as above  rec Plan A = Automatic = Symbicort 160 Take 2 puffs first thing in am and then another 2 puffs about 12 hours later and duoneb four times   Work on inhaler technique:   Plan B = Backup Only use your  albuterol inhaler as a rescue medication Prednisone 10 mg  Take 4 for three days 3 for three days 2 for three days 1 for three days and stop  Please see patient coordinator before you leave today  to schedule 02 2lpm 24/7 for now Please remember to go to the  x-ray department  for your tests - we will call you with the results when they are available   Please schedule a follow up office visit in 2 weeks, sooner if needed  with all medications /inhalers/ solutions in hand so we can verify exactly what you are taking. This includes all medications from all doctors and over the counters and bring your DRUG FORMULARY  - consider change coreg to bisoprolol or at least bystolic samples short run  - add levaquin 500 mg x 10 d for ? pna    03/08/2018  f/u ov/Arion Shankles re:  COPD  III  On symb and duoneb qid  Chief Complaint  Patient presents with  . Follow-up    Breathing has improved back to baseline. He is not using his albuterol inhaler but is using his duoneb 4 x daily.   Dyspnea:  Better, usually does [employer] leaning on cart s 02  Cough: none now Sleeping: bed blocks  SABA use: none extra in hfa form  02: 2lpm at hs not with activity  rec Plan A = Automatic = Incruse/BREO  one click eam am and continue 02 2 lpm at bedtime and wear it  as needed during the day to keep your sats > 90% with walking or go slower Plan B = Backup Only use your albuterol inhaler as a rescue medication  Plan C = Crisis - only use your albuterol nebulizer(not your ipatropium)  if you first try Plan B and it fails to help > ok to use the nebulizer up to every 4 hours but if start needing it regularly call for immediate appointment Please remember to go to the  x-ray department  for your tests - we will call you with the results when they are available   Please schedule a follow up office visit in 2 weeks with your formulary , sooner if needed  with all medications /inhalers/ solutions in hand so we can verify exactly what  you are taking. This includes all medications from all doctors and over the counters (ok to see NP if I don't have opening )    03/25/2018  f/u ov/Lenix Kidd re:  COPD III overall worse since Oct 2019, esp since Feb 02 2018 Chief Complaint  Patient presents with  . Follow-up    Breathing has been progressively worse since the last visit. He is using his rescue inhaler daily and neb 4 x daily.   Dyspnea:  Across the room on 3lpm  Cough: turned green x sev days prior to OV / sometimes coughs so hard hurts in chest (ant/ generalized)  but not using flutter as rec   Sleeping: bed blocks does fine most nocts SABA use: as above  02:  3lpm 24/7    No obvious day to day or daytime variability or assoc or mucus plugs or hemoptysis   or chest tightness, subjective wheeze or overt sinus or hb symptoms.   Sleeping as above  without nocturnal  or early am exacerbation  of respiratory  c/o's or need for noct saba. Also denies any obvious fluctuation of symptoms with weather or environmental changes or other aggravating or alleviating factors except as outlined above   No unusual exposure hx or h/o childhood pna/ asthma or knowledge of premature birth.  Current Allergies, Complete Past Medical History, Past Surgical History, Family History, and Social History were reviewed in [employer] record.  ROS  The following are not active complaints unless bolded Hoarseness, sore throat, dysphagia, dental problems, itching, sneezing,  nasal congestion or discharge of excess mucus or purulent secretions, ear ache,   fever, chills, sweats, unintended wt loss or wt gain, classically pleuritic or exertional cp,  orthopnea pnd or arm/hand swelling  or leg swelling, presyncope, palpitations, abdominal pain, anorexia, nausea, vomiting, diarrhea  or change in bowel habits or change in bladder habits, change in stools or change in urine, dysuria, hematuria,  rash, arthralgias, visual complaints, headache,  numbness, weakness or ataxia or problems with walking or coordination,  change in mood or  memory.        Current Meds  Medication Sig  . albuterol (  PROVENTIL HFA;VENTOLIN HFA) 108 (90 Base) MCG/ACT inhaler INHALE 2 PUFFS INTO THE LUNGS EVERY 6 (SIX) HOURS AS NEEDED FOR WHEEZING OR SHORTNESS OF BREATH.  Marland Kitchen albuterol (PROVENTIL) (2.5 MG/3ML) 0.083% nebulizer solution Take 3 mLs (2.5 mg total) by nebulization every 4 (four) hours as needed for wheezing or shortness of breath.  Marland Kitchen amLODipine (NORVASC) 5 MG tablet TAKE 1 TABLET EVERY DAY  . aspirin 81 MG EC tablet Take 1 tablet (81 mg total) by mouth daily. Swallow whole.  . carvedilol (COREG) 12.5 MG tablet TAKE 1 TABLET TWICE DAILY WITH MEALS  . famotidine (PEPCID) 20 MG tablet Take 20 mg by mouth at bedtime.  . gabapentin (NEURONTIN) 300 MG capsule TAKE 1 CAPSULE THREE TIMES DAILY  . Guaifenesin (MUCINEX MAXIMUM STRENGTH) 1200 MG TB12 Take 1 tablet by mouth 2 (two) times daily.  Marland Kitchen ipratropium-albuterol (DUONEB) 0.5-2.5 (3) MG/3ML SOLN 1 vial in nebulizer 4 x daily  . OXYGEN 2 lpm with sleep and exertion if needed [facility]  . Respiratory Therapy Supplies (FLUTTER) DEVI Use as directed  . rosuvastatin (CRESTOR) 20 MG tablet TAKE 1 TABLET EVERY DAY (Patient taking differently: Take 20 mg by mouth daily. )  . terazosin (HYTRIN) 10 MG capsule TAKE 1 CAPSULE AT BEDTIME              Objective:   Physical Exam  amb slt hoarse wm    03/25/2018       155  03/08/2018         149   01/25/2018       156  12/05/2017       157  11/21/2017     157  05/24/2017        164    01/25/17 150 lb 12.8 oz (68.4 kg)  01/18/17 150 lb (68 kg)  01/08/17 150 lb (68 kg)      Vital signs reviewed - Note on arrival 02 sats  94% on 3lpm continous and BP 90/54 p am amlodipine   HEENT: Upper partial plate/ nl  oropharynx. Nl external ear canals without cough reflex -  Mild bilateral non-specific turbinate edema     NECK :  without JVD/Nodes/TM/ nl carotid  upstrokes bilaterally   LUNGS: no acc muscle use,  Mod barrel  contour chest wall with bilateral Distant bs with minimal coarse insp crackles and a few sonorous exp rhonchi as well  without cough on insp or exp maneuver and mod  Hyperresonant  to  percussion bilaterally     CV:  RRR  no s3 or murmur or increase in P2, and no edema   ABD:  soft and nontender with pos mid  insp Hoover's  in the supine position. No bruits or organomegaly appreciated, bowel sounds nl  MS:   Nl gait/  ext warm without deformities, calf tenderness, cyanosis or clubbing No obvious joint restrictions   SKIN: warm and dry without lesions    NEURO:  alert, approp, nl sensorium with  no motor or cerebellar deficits apparent.         CXR PA and Lateral:   03/25/2018 :    I personally reviewed images and agree with radiology impression as follows:   Persistent and slightly increased peripheral opacities with some more acute infiltrative density seen in the bases. CT may be helpful for further evaluation.   Lab Results  Component Value Date   WBC 11.3 (H) 03/25/2018   HGB 10.3 (L) 03/25/2018   HCT 31.6 (L) 03/25/2018  MCV 87.1 03/25/2018   PLT 546.0 (H) 03/25/2018       EOS                                                               0.6                                    03/25/2018             Lab Results  Component Value Date   ESRSEDRATE 37 (H) 03/25/2018     Assessment:

## 2018-03-25 NOTE — Patient Instructions (Signed)
Change BREO / Incruse to Anoro click x one and take two good drags (hold with the R hand)  Prednisone 10 mg take  4 each am x 2 days,   2 each am x 2 days,  1 each am x 2 days and stop   Augmentin 875 mg take one pill twice daily  X 10 days - take at breakfast and supper with large glass of water.  It would help reduce the usual side effects (diarrhea and yeast infections) if you ate cultured yogurt at lunch.    Plan B = Backup Only use your albuterol inhaler as a rescue medication to be used if you can't catch your breath by resting or doing a relaxed purse lip breathing pattern.  - The less you use it, the better it will work when you need it. - Ok to use the inhaler up to 2 puffs  every 4 hours if you must but call for appointment if use goes up over your usual need - Don't leave home without it !!  (think of it like the spare tire for your car)   Plan C = Crisis - only use your albuterol nebulizer(NOT your ipatropium/albuterol)  if you first try Plan B and it fails to help > ok to use the nebulizer up to every 4 hours but if start needing it regularly call for immediate appointment   Add protonix 40 mg Take 30-60 min before first meal of the day   The goal with 02 is to keep your 02 saturations over 90% at all times    Please remember to go to the lab and x-ray department   for your tests - we will call you with the results when they are available.       Please schedule a follow up office visit in 6 weeks, call sooner if needed

## 2018-03-25 NOTE — Assessment & Plan Note (Addendum)
First detected 02/22/2018 vs baseline study 11/21/17 so rx levaquin 500 x 10 days  -  HRCT rec p 6 d of pred and 10 d augmentin     Miscellaneous:Alv microlithiasis, alv proteinosis, asp, bronchiectais, BOOP   ARDS/ AIP Occupational dz/ HSP Neoplasm Infection esp MAI Drug  No obvious suspects Pulmonary emboli, Protein disorders Edema/Eosinophilic dz - note elevated eos > try pred short term  Sarcoidosis Connective tissue dz>  Esr not that impressive  Hist X / Hemorrhage Idiopathic  > HRCT next step

## 2018-03-25 NOTE — Progress Notes (Signed)
Spoke with pt and notified of results per Dr. Wert. Pt verbalized understanding and denied any questions. 

## 2018-03-25 NOTE — Assessment & Plan Note (Signed)
Try off amolodipine 03/25/2018 due to low bp - also may help gas exchange as interferes with hypoxic pulmonary vasoconstriction.

## 2018-03-25 NOTE — Assessment & Plan Note (Signed)
Quit smoking 1995 PFT's  01/20/2017  FEV1 1.05 (39 % ) ratio 54  p 18 % improvement from saba p ? prior to study with DLCO  50/53  % corrects to 77 % for alv volume  - 01/25/2017   rec increase symb to 160 2bid trial  - alpha one AT screening  01/18/17   MM  Level 190 PFT's  02/23/2017  FEV1 1.17 (44 % ) ratio 54  p 0 % improvement from saba p symb 160 prior to study with DLCO   39 % corrects to 59 % for alv volume  - 02/23/2017    add tudorza trial > no change and could not afford - 12/05/2017 flutter valve added   - 02/22/2018  After extensive coaching inhaler device,  effectiveness =    75% > restart symb 160 2bid - 03/08/2018    try trelegy (no samples so gave 2 weeks of samples of breo 100/incruse)   - 03/25/2018  Given anoro samples    If worse on anoro change to Trelegy next step   - The proper method of use, as well as anticipated side effects, of an elipta  inhaler are discussed and demonstrated to the patient.    I had an extended discussion with the patient reviewing all relevant studies completed to date and  lasting 15 to 20 minutes of a 25 minute visit    See device teaching which extended face to face time for this visit.  Each maintenance medication was reviewed in detail including emphasizing most importantly the difference between maintenance and prns and under what circumstances the prns are to be triggered using an action plan format that is not reflected in the computer generated alphabetically organized AVS which I have not found useful in most complex patients, especially with respiratory illnesses  Please see AVS for specific instructions unique to this visit that I personally wrote and verbalized to the the pt in detail and then reviewed with pt  by my nurse highlighting any  changes in therapy recommended at today's visit to their plan of care.

## 2018-03-26 ENCOUNTER — Inpatient Hospital Stay (HOSPITAL_COMMUNITY)
Admission: EM | Admit: 2018-03-26 | Discharge: 2018-03-28 | DRG: 193 | Disposition: A | Discharge status: Home / Self Care | Payer: Medicare HMO | Attending: Internal Medicine | Admitting: Internal Medicine

## 2018-03-26 ENCOUNTER — Encounter (HOSPITAL_COMMUNITY): Payer: Self-pay

## 2018-03-26 ENCOUNTER — Other Ambulatory Visit: Payer: Self-pay

## 2018-03-26 ENCOUNTER — Emergency Department (HOSPITAL_COMMUNITY): Payer: Medicare HMO

## 2018-03-26 ENCOUNTER — Telehealth: Payer: Self-pay | Admitting: *Deleted

## 2018-03-26 DIAGNOSIS — J440 Chronic obstructive pulmonary disease with acute lower respiratory infection: Secondary | ICD-10-CM | POA: Diagnosis present

## 2018-03-26 DIAGNOSIS — Z809 Family history of malignant neoplasm, unspecified: Secondary | ICD-10-CM

## 2018-03-26 DIAGNOSIS — Z7982 Long term (current) use of aspirin: Secondary | ICD-10-CM | POA: Diagnosis not present

## 2018-03-26 DIAGNOSIS — R069 Unspecified abnormalities of breathing: Secondary | ICD-10-CM | POA: Diagnosis not present

## 2018-03-26 DIAGNOSIS — N200 Calculus of kidney: Secondary | ICD-10-CM | POA: Diagnosis present

## 2018-03-26 DIAGNOSIS — Z87442 Personal history of urinary calculi: Secondary | ICD-10-CM | POA: Diagnosis present

## 2018-03-26 DIAGNOSIS — R402362 Coma scale, best motor response, obeys commands, at arrival to emergency department: Secondary | ICD-10-CM | POA: Diagnosis present

## 2018-03-26 DIAGNOSIS — J189 Pneumonia, unspecified organism: Principal | ICD-10-CM | POA: Diagnosis present

## 2018-03-26 DIAGNOSIS — R0902 Hypoxemia: Secondary | ICD-10-CM | POA: Diagnosis not present

## 2018-03-26 DIAGNOSIS — J9621 Acute and chronic respiratory failure with hypoxia: Secondary | ICD-10-CM | POA: Diagnosis present

## 2018-03-26 DIAGNOSIS — K5730 Diverticulosis of large intestine without perforation or abscess without bleeding: Secondary | ICD-10-CM | POA: Diagnosis present

## 2018-03-26 DIAGNOSIS — E785 Hyperlipidemia, unspecified: Secondary | ICD-10-CM | POA: Diagnosis not present

## 2018-03-26 DIAGNOSIS — Z87891 Personal history of nicotine dependence: Secondary | ICD-10-CM

## 2018-03-26 DIAGNOSIS — R402252 Coma scale, best verbal response, oriented, at arrival to emergency department: Secondary | ICD-10-CM | POA: Diagnosis present

## 2018-03-26 DIAGNOSIS — Z9981 Dependence on supplemental oxygen: Secondary | ICD-10-CM

## 2018-03-26 DIAGNOSIS — N401 Enlarged prostate with lower urinary tract symptoms: Secondary | ICD-10-CM | POA: Diagnosis present

## 2018-03-26 DIAGNOSIS — R000 Tachycardia, unspecified: Secondary | ICD-10-CM | POA: Diagnosis not present

## 2018-03-26 DIAGNOSIS — Z825 Family history of asthma and other chronic lower respiratory diseases: Secondary | ICD-10-CM | POA: Diagnosis not present

## 2018-03-26 DIAGNOSIS — J441 Chronic obstructive pulmonary disease with (acute) exacerbation: Secondary | ICD-10-CM | POA: Diagnosis present

## 2018-03-26 DIAGNOSIS — R0600 Dyspnea, unspecified: Secondary | ICD-10-CM | POA: Diagnosis not present

## 2018-03-26 DIAGNOSIS — N400 Enlarged prostate without lower urinary tract symptoms: Secondary | ICD-10-CM | POA: Diagnosis present

## 2018-03-26 DIAGNOSIS — R402142 Coma scale, eyes open, spontaneous, at arrival to emergency department: Secondary | ICD-10-CM | POA: Diagnosis present

## 2018-03-26 DIAGNOSIS — J9622 Acute and chronic respiratory failure with hypercapnia: Secondary | ICD-10-CM | POA: Diagnosis not present

## 2018-03-26 DIAGNOSIS — K219 Gastro-esophageal reflux disease without esophagitis: Secondary | ICD-10-CM | POA: Diagnosis present

## 2018-03-26 DIAGNOSIS — R0689 Other abnormalities of breathing: Secondary | ICD-10-CM | POA: Diagnosis not present

## 2018-03-26 DIAGNOSIS — N183 Chronic kidney disease, stage 3 unspecified: Secondary | ICD-10-CM | POA: Diagnosis present

## 2018-03-26 DIAGNOSIS — R0603 Acute respiratory distress: Secondary | ICD-10-CM | POA: Diagnosis not present

## 2018-03-26 DIAGNOSIS — Z8249 Family history of ischemic heart disease and other diseases of the circulatory system: Secondary | ICD-10-CM

## 2018-03-26 DIAGNOSIS — Z20828 Contact with and (suspected) exposure to other viral communicable diseases: Secondary | ICD-10-CM | POA: Diagnosis present

## 2018-03-26 DIAGNOSIS — I129 Hypertensive chronic kidney disease with stage 1 through stage 4 chronic kidney disease, or unspecified chronic kidney disease: Secondary | ICD-10-CM | POA: Diagnosis not present

## 2018-03-26 DIAGNOSIS — I10 Essential (primary) hypertension: Secondary | ICD-10-CM | POA: Diagnosis not present

## 2018-03-26 DIAGNOSIS — R062 Wheezing: Secondary | ICD-10-CM | POA: Diagnosis not present

## 2018-03-26 LAB — CBC WITH DIFFERENTIAL/PLATELET
Abs Immature Granulocytes: 0.13 10*3/uL — ABNORMAL HIGH (ref 0.00–0.07)
Basophils Absolute: 0.1 10*3/uL (ref 0.0–0.1)
Basophils Relative: 0 %
Eosinophils Absolute: 0.4 10*3/uL (ref 0.0–0.5)
Eosinophils Relative: 3 %
HCT: 38 % — ABNORMAL LOW (ref 39.0–52.0)
Hemoglobin: 11 g/dL — ABNORMAL LOW (ref 13.0–17.0)
IMMATURE GRANULOCYTES: 1 %
Lymphocytes Relative: 6 %
Lymphs Abs: 1.1 10*3/uL (ref 0.7–4.0)
MCH: 27.3 pg (ref 26.0–34.0)
MCHC: 28.9 g/dL — ABNORMAL LOW (ref 30.0–36.0)
MCV: 94.3 fL (ref 80.0–100.0)
Monocytes Absolute: 1.1 10*3/uL — ABNORMAL HIGH (ref 0.1–1.0)
Monocytes Relative: 7 %
Neutro Abs: 14 10*3/uL — ABNORMAL HIGH (ref 1.7–7.7)
Neutrophils Relative %: 83 %
Platelets: 577 10*3/uL — ABNORMAL HIGH (ref 150–400)
RBC: 4.03 MIL/uL — ABNORMAL LOW (ref 4.22–5.81)
RDW: 14.2 % (ref 11.5–15.5)
WBC: 16.8 10*3/uL — ABNORMAL HIGH (ref 4.0–10.5)
nRBC: 0 % (ref 0.0–0.2)

## 2018-03-26 LAB — PROCALCITONIN: Procalcitonin: <0.1 ng/mL

## 2018-03-26 LAB — INFLUENZA PANEL BY PCR (TYPE A & B)
Influenza A By PCR: NEGATIVE
Influenza B By PCR: NEGATIVE

## 2018-03-26 LAB — URINALYSIS, ROUTINE W REFLEX MICROSCOPIC
Bilirubin Urine: NEGATIVE
Glucose, UA: NEGATIVE mg/dL
KETONES UR: 5 mg/dL — AB
Nitrite: POSITIVE — AB
Protein, ur: NEGATIVE mg/dL
Specific Gravity, Urine: 1.016 (ref 1.005–1.030)
pH: 5 (ref 5.0–8.0)

## 2018-03-26 LAB — BASIC METABOLIC PANEL
ANION GAP: 11 (ref 5–15)
BUN: 18 mg/dL (ref 8–23)
CO2: 29 mmol/L (ref 22–32)
Calcium: 8.9 mg/dL (ref 8.9–10.3)
Chloride: 98 mmol/L (ref 98–111)
Creatinine, Ser: 1.31 mg/dL — ABNORMAL HIGH (ref 0.61–1.24)
GFR calc Af Amer: >60 mL/min (ref >60)
GFR calc non Af Amer: 52 mL/min — ABNORMAL LOW (ref >60)
Glucose, Bld: 143 mg/dL — ABNORMAL HIGH (ref 70–99)
Potassium: 4.3 mmol/L (ref 3.5–5.1)
Sodium: 138 mmol/L (ref 135–145)

## 2018-03-26 LAB — MRSA PCR SCREENING: MRSA by PCR: NEGATIVE

## 2018-03-26 LAB — LACTIC ACID, PLASMA: Lactic Acid, Venous: 1 mmol/L (ref 0.5–1.9)

## 2018-03-26 LAB — STREP PNEUMONIAE URINARY ANTIGEN: Strep Pneumo Urinary Antigen: NEGATIVE

## 2018-03-26 LAB — EXPECTORATED SPUTUM ASSESSMENT W REFEX TO RESP CULTURE

## 2018-03-26 MED ORDER — TERAZOSIN HCL 5 MG PO CAPS
10.0000 mg | ORAL_CAPSULE | Freq: Every day | ORAL | Status: DC
  Administered 2018-03-26 – 2018-03-27 (×2): 10 mg via ORAL
  Filled 2018-03-26 (×2): qty 2

## 2018-03-26 MED ORDER — FAMOTIDINE 20 MG PO TABS
20.0000 mg | ORAL_TABLET | Freq: Every day | ORAL | Status: DC
  Administered 2018-03-26 – 2018-03-27 (×2): 20 mg via ORAL
  Filled 2018-03-26 (×2): qty 1

## 2018-03-26 MED ORDER — ALBUTEROL SULFATE HFA 108 (90 BASE) MCG/ACT IN AERS: 8.0000 | INHALATION_SPRAY | RESPIRATORY_TRACT | Status: DC | PRN

## 2018-03-26 MED ORDER — PANTOPRAZOLE SODIUM 40 MG PO TBEC
40.0000 mg | DELAYED_RELEASE_TABLET | Freq: Every day | ORAL | Status: DC
  Administered 2018-03-27 – 2018-03-28 (×2): 40 mg via ORAL
  Filled 2018-03-26 (×2): qty 1

## 2018-03-26 MED ORDER — ONDANSETRON HCL 4 MG PO TABS: 4.0000 mg | ORAL_TABLET | Freq: Four times a day (QID) | ORAL | Status: DC | PRN

## 2018-03-26 MED ORDER — SODIUM CHLORIDE 0.9% FLUSH
3.0000 mL | Freq: Two times a day (BID) | INTRAVENOUS | Status: DC
  Administered 2018-03-26 – 2018-03-28 (×4): 3 mL via INTRAVENOUS

## 2018-03-26 MED ORDER — ASPIRIN EC 81 MG PO TBEC
81.0000 mg | DELAYED_RELEASE_TABLET | Freq: Every day | ORAL | Status: DC
  Administered 2018-03-27 – 2018-03-28 (×2): 81 mg via ORAL
  Filled 2018-03-26 (×2): qty 1

## 2018-03-26 MED ORDER — METHYLPREDNISOLONE SODIUM SUCC 125 MG IJ SOLR
125.0000 mg | Freq: Once | INTRAMUSCULAR | Status: AC
  Administered 2018-03-26: 125 mg via INTRAVENOUS
  Filled 2018-03-26: qty 2

## 2018-03-26 MED ORDER — SODIUM CHLORIDE 0.9 % IV SOLN
INTRAVENOUS | Status: DC
  Administered 2018-03-26 – 2018-03-28 (×4): via INTRAVENOUS

## 2018-03-26 MED ORDER — ADULT MULTIVITAMIN W/MINERALS CH
1.0000 | ORAL_TABLET | Freq: Every day | ORAL | Status: DC
  Administered 2018-03-27 – 2018-03-28 (×2): 1 via ORAL
  Filled 2018-03-26 (×2): qty 1

## 2018-03-26 MED ORDER — ORAL CARE MOUTH RINSE
15.0000 mL | Freq: Two times a day (BID) | OROMUCOSAL | Status: DC
  Administered 2018-03-26 – 2018-03-28 (×3): 15 mL via OROMUCOSAL

## 2018-03-26 MED ORDER — SODIUM CHLORIDE 0.9 % IV SOLN
500.0000 mg | INTRAVENOUS | Status: DC
  Administered 2018-03-26 – 2018-03-28 (×3): 500 mg via INTRAVENOUS
  Filled 2018-03-26 (×3): qty 500

## 2018-03-26 MED ORDER — IPRATROPIUM-ALBUTEROL 20-100 MCG/ACT IN AERS
1.0000 | INHALATION_SPRAY | Freq: Four times a day (QID) | RESPIRATORY_TRACT | Status: DC
  Administered 2018-03-26 – 2018-03-27 (×6): 1 via RESPIRATORY_TRACT
  Filled 2018-03-26: qty 4

## 2018-03-26 MED ORDER — GUAIFENESIN ER 600 MG PO TB12
1200.0000 mg | ORAL_TABLET | Freq: Two times a day (BID) | ORAL | Status: DC
  Administered 2018-03-26 – 2018-03-28 (×4): 1200 mg via ORAL
  Filled 2018-03-26 (×4): qty 2

## 2018-03-26 MED ORDER — ACETAMINOPHEN 325 MG PO TABS: 650.0000 mg | ORAL_TABLET | Freq: Four times a day (QID) | ORAL | Status: DC | PRN

## 2018-03-26 MED ORDER — HYDROCOD POLST-CPM POLST ER 10-8 MG/5ML PO SUER
5.0000 mL | Freq: Two times a day (BID) | ORAL | Status: DC | PRN
  Administered 2018-03-28: 5 mL via ORAL
  Filled 2018-03-26: qty 5

## 2018-03-26 MED ORDER — CARVEDILOL 12.5 MG PO TABS
12.5000 mg | ORAL_TABLET | Freq: Two times a day (BID) | ORAL | Status: DC
  Administered 2018-03-26 – 2018-03-28 (×4): 12.5 mg via ORAL
  Filled 2018-03-26 (×4): qty 1

## 2018-03-26 MED ORDER — GABAPENTIN 300 MG PO CAPS
300.0000 mg | ORAL_CAPSULE | Freq: Three times a day (TID) | ORAL | Status: DC
  Administered 2018-03-26 – 2018-03-28 (×6): 300 mg via ORAL
  Filled 2018-03-26 (×6): qty 1

## 2018-03-26 MED ORDER — ENOXAPARIN SODIUM 40 MG/0.4ML ~~LOC~~ SOLN
40.0000 mg | SUBCUTANEOUS | Status: DC
  Administered 2018-03-26 – 2018-03-27 (×2): 40 mg via SUBCUTANEOUS
  Filled 2018-03-26 (×2): qty 0.4

## 2018-03-26 MED ORDER — ROSUVASTATIN CALCIUM 20 MG PO TABS
20.0000 mg | ORAL_TABLET | Freq: Every day | ORAL | Status: DC
  Administered 2018-03-27 – 2018-03-28 (×2): 20 mg via ORAL
  Filled 2018-03-26 (×2): qty 1

## 2018-03-26 MED ORDER — ONDANSETRON HCL 4 MG/2ML IJ SOLN: 4.0000 mg | Freq: Four times a day (QID) | INTRAMUSCULAR | Status: DC | PRN

## 2018-03-26 MED ORDER — GUAIFENESIN-DM 100-10 MG/5ML PO SYRP
10.0000 mL | ORAL_SOLUTION | ORAL | Status: DC | PRN
  Administered 2018-03-28: 10 mL via ORAL
  Filled 2018-03-26: qty 10

## 2018-03-26 MED ORDER — AMLODIPINE BESYLATE 5 MG PO TABS
5.0000 mg | ORAL_TABLET | Freq: Every day | ORAL | Status: DC
  Administered 2018-03-27 – 2018-03-28 (×2): 5 mg via ORAL
  Filled 2018-03-26 (×2): qty 1

## 2018-03-26 MED ORDER — ENSURE ENLIVE PO LIQD
237.0000 mL | Freq: Two times a day (BID) | ORAL | Status: DC
  Administered 2018-03-27 – 2018-03-28 (×4): 237 mL via ORAL

## 2018-03-26 MED ORDER — CHLORHEXIDINE GLUCONATE CLOTH 2 % EX PADS
6.0000 | MEDICATED_PAD | Freq: Every day | CUTANEOUS | Status: DC
  Administered 2018-03-28: 6 via TOPICAL

## 2018-03-26 MED ORDER — METHYLPREDNISOLONE SODIUM SUCC 40 MG IJ SOLR
40.0000 mg | Freq: Two times a day (BID) | INTRAMUSCULAR | Status: DC
  Administered 2018-03-26 – 2018-03-28 (×4): 40 mg via INTRAVENOUS
  Filled 2018-03-26 (×4): qty 1

## 2018-03-26 MED ORDER — SODIUM CHLORIDE 0.9 % IV SOLN
2.0000 g | INTRAVENOUS | Status: DC
  Administered 2018-03-26 – 2018-03-28 (×3): 2 g via INTRAVENOUS
  Filled 2018-03-26 (×2): qty 2
  Filled 2018-03-26: qty 20

## 2018-03-26 NOTE — ED Notes (Addendum)
Pt difficult stick, only able to obtain 1 set of cultures. 2x attempt

## 2018-03-26 NOTE — ED Notes (Signed)
ED TO INPATIENT HANDOFF REPORT  ED Nurse Name and Phone #: Maddie RN  S Name/Age/Gender Justin Walters 78 y.o. male Room/Bed: WA18/WA18  Code Status   Code Status: Prior  Home/SNF/Other Home {Patient oriented to: A&Ox4 Is this baseline? Yes   Triage Complete: Triage complete  Chief Complaint resp distress  Triage Note Pt from home.  Pt hx COPD.  Pt c/o of SOB all night, the last 6 hours have been worse.  Pt took own nebs at home.  EMS gave him 10 mg albuterol and 0.5 atrovent.  On arrival respirations were 40 times per min.  Pt arrived on CPAP. Fire reported pt's O2 was 87% on arrival.   Allergies No Known Allergies  Level of Care/Admitting Diagnosis ED Disposition    ED Disposition Condition Comment   Admit  Hospital Area: [place] [[id number]]  Level of Care: Stepdown [14]  Admit to SDU based on following criteria: Respiratory Distress:  Frequent assessment and/or intervention to maintain adequate ventilation/respiration, pulmonary toilet, and respiratory treatment.  Diagnosis: Acute on chronic respiratory failure with hypoxia and hypercapnia [facility]) [[id number]]  Admitting Physician: Patrecia Pour [phone]  Attending Physician: Patrecia Pour [phone]  Estimated length of stay: past midnight tomorrow  Certification:: I certify this patient will need inpatient services for at least 2 midnights  Bed request comments: High risk COVID  PT Class (Do Not Modify): Inpatient [101]  PT Acc Code (Do Not Modify): Private [1]       B Medical/Surgery History Past Medical History:  Diagnosis Date  . At risk for sleep apnea    STOP-BANG= 5    SENT TO PCP 06-13-2013  . Bladder stone   . BPH (benign prostatic hyperplasia)   . COPD (chronic obstructive pulmonary disease) ([place])   . Diverticulosis of colon   . Dysuria   . Glucose intolerance (impaired glucose tolerance)   . History of kidney stones   . Hyperlipidemia   . Hypertension   . Ureteral disorder 2018    ureteral repair   Past Surgical History:  Procedure Laterality Date  . APPENDECTOMY  1980's  . CATARACT EXTRACTION W/ INTRAOCULAR LENS  IMPLANT, BILATERAL    . COLONOSCOPY  10-05-2003   tics only   . CYSTOSCOPY N/A 06/23/2013   Procedure: CYSTOSCOPY WITH LITHOLAPAXY, BLADDER EXPLORATION, REPAIR BLADDER PERFORATION;  Surgeon: Arvil Persons, MD;  Location: [facility];  Service: Urology;  Laterality: N/A;  . CYSTOSCOPY/URETEROSCOPY/HOLMIUM LASER/STENT PLACEMENT Left 04/03/2017   Procedure: CYSTOSCOPY LEFT RETROGRADE/LEFT /URETEROSCOPY/HOLMIUM LASER/BASKET STONE EXTRACTION/STENT PLACEMENT/ URETHRAL BIOPSY;  Surgeon: [name], MD;  Location: [facility];  Service: Urology;  Laterality: Left;  . EXTRACORPOREAL SHOCK WAVE LITHOTRIPSY Left 10/23/2016   Procedure: LEFT EXTRACORPOREAL SHOCK WAVE LITHOTRIPSY (ESWL);  Surgeon: Nickie Retort, MD;  Location: [facility];  Service: Urology;  Laterality: Left;  . EXTRACORPOREAL SHOCK WAVE LITHOTRIPSY Left 01/08/2017   Procedure: LEFT EXTRACORPOREAL SHOCK WAVE LITHOTRIPSY (ESWL);  Surgeon: [name], MD;  Location: [facility];  Service: Urology;  Laterality: Left;  . HOLMIUM LASER APPLICATION N/A 02/15/863   Procedure: HOLMIUM LASER APPLICATION;  Surgeon: Arvil Persons, MD;  Location: [facility];  Service: Urology;  Laterality: N/A;  . LAPAROSCOPIC INGUINAL HERNIA REPAIR Bilateral 10-09-2003  . TRANSURETHRAL RESECTION OF PROSTATE N/A 06/23/2013   Procedure: TRANSURETHRAL RESECTION OF THE PROSTATE;  Surgeon: Arvil Persons, MD;  Location: [facility];  Service: Urology;  Laterality: N/A;     A IV  Location/Drains/Wounds Patient Lines/Drains/Airways Status   Active Line/Drains/Airways    Name:   Placement date:   Placement time:   Site:   Days:   Peripheral IV 03/26/18 Right Antecubital   03/26/18    0736    Antecubital   less than 1   Ureteral Drain/Stent Left ureter 6 Fr.   04/03/17    1306    Left ureter   357    Incision (Closed) 04/03/17 Penis   04/03/17    1313     357          Intake/Output Last 24 hours  Intake/Output Summary (Last 24 hours) at 03/26/2018 1127 Last data filed at 03/26/2018 1005 Gross per 24 hour  Intake 100 ml  Output -  Net 100 ml    Labs/Imaging Results for orders placed or performed during the hospital encounter of 03/26/18 (from the past 48 hour(s))  CBC with Differential/Platelet     Status: Abnormal   Collection Time: 03/26/18  7:18 AM  Result Value Ref Range   WBC 16.8 (H) 4.0 - 10.5 K/uL   RBC 4.03 (L) 4.22 - 5.81 MIL/uL   Hemoglobin 11.0 (L) 13.0 - 17.0 g/dL   HCT 38.0 (L) 39.0 - 52.0 %   MCV 94.3 80.0 - 100.0 fL   MCH 27.3 26.0 - 34.0 pg   MCHC 28.9 (L) 30.0 - 36.0 g/dL   RDW 14.2 11.5 - 15.5 %   Platelets 577 (H) 150 - 400 K/uL   nRBC 0.0 0.0 - 0.2 %   Neutrophils Relative % 83 %   Neutro Abs 14.0 (H) 1.7 - 7.7 K/uL   Lymphocytes Relative 6 %   Lymphs Abs 1.1 0.7 - 4.0 K/uL   Monocytes Relative 7 %   Monocytes Absolute 1.1 (H) 0.1 - 1.0 K/uL   Eosinophils Relative 3 %   Eosinophils Absolute 0.4 0.0 - 0.5 K/uL   Basophils Relative 0 %   Basophils Absolute 0.1 0.0 - 0.1 K/uL   Immature Granulocytes 1 %   Abs Immature Granulocytes 0.13 (H) 0.00 - 0.07 K/uL    Comment: Performed at [facility], [place] [address], [place], [place] [address]  Basic metabolic panel     Status: Abnormal   Collection Time: 03/26/18  7:18 AM  Result Value Ref Range   Sodium 138 135 - 145 mmol/L   Potassium 4.3 3.5 - 5.1 mmol/L   Chloride 98 98 - 111 mmol/L   CO2 29 22 - 32 mmol/L   Glucose, Bld 143 (H) 70 - 99 mg/dL   BUN 18 8 - 23 mg/dL   Creatinine, Ser 1.31 (H) 0.61 - 1.24 mg/dL   Calcium 8.9 8.9 - 10.3 mg/dL   GFR calc non Af Amer 52 (L) >60 mL/min   GFR calc Af Amer >60 >60 mL/min   Anion gap 11 5 - 15    Comment: Performed at [facility], [place] [address], [place],  [address]  Influenza panel by PCR (type A & B)      Status: None   Collection Time: 03/26/18  8:09 AM  Result Value Ref Range   Influenza A By PCR NEGATIVE NEGATIVE   Influenza B By PCR NEGATIVE NEGATIVE    Comment: (NOTE) The Xpert Xpress Flu assay is intended as an aid in the diagnosis of  influenza and should not be used as a sole basis for treatment.  This  assay is FDA approved for nasopharyngeal swab specimens  only. Nasal  washings and aspirates are unacceptable for Xpert Xpress Flu testing. Performed at [facility], [place] [address] [place], [place] [address]   Lactic acid, plasma     Status: None   Collection Time: 03/26/18  8:09 AM  Result Value Ref Range   Lactic Acid, Venous 1.0 0.5 - 1.9 mmol/L    Comment: Performed at [facility], [place] [address], [place], [place] [address]   Dg Chest 2 View  Result Date: 03/25/2018 CLINICAL DATA:  Follow-up pulmonary infiltrates EXAM: CHEST - 2 VIEW COMPARISON:  03/08/2018 FINDINGS: Cardiac shadow is stable. Patchy airspace opacity is again identified relatively increased from the prior exam. Additionally some more focal infiltrates are seen in the left base projecting on the lateral film in the lingula and left lower lobe. No sizable effusion is seen. No bony abnormality is noted. IMPRESSION: Persistent and slightly increased peripheral opacities with some more acute infiltrative density seen in the bases. CT may be helpful for further evaluation. Electronically Signed   By: Inez Catalina M.D.   On: 03/25/2018 11:00   Dg Chest Port 1 View  Result Date: 03/26/2018 CLINICAL DATA:  Respiratory distress EXAM: PORTABLE CHEST 1 VIEW COMPARISON:  March 25, 2018 and October 31, 2017 chest radiographs FINDINGS: Lungs are hyperexpanded with scattered areas of scarring. There is persistent patchy airspace opacity in the lung bases. Elsewhere, there is somewhat less interstitial thickening and patchy consolidation, although there is underlying reticular interstitial  disease fairly diffusely throughout the lungs. The heart size and pulmonary vascularity are normal. No adenopathy. There is aortic atherosclerosis as well as foci of coronary artery calcification. Bones are osteoporotic. IMPRESSION: Lungs hyperexpanded. Patchy airspace opacity in the lung bases is likely due to pneumonia. Less coarse interstitial thickening and patchy airspace opacity in the upper and mid lungs compared to 1 day prior. There remains underlying reticular interstitial disease, probably enlarged part due to fibrosis. No new opacity evident. Heart size within normal limits. There is not felt to be cardiogenic edema present. No adenopathy evident. Aortic Atherosclerosis (ICD10-I70.0). There also foci of coronary artery calcification. Electronically Signed   By: Lowella Grip III M.D.   On: 03/26/2018 08:59    Pending Labs Unresulted Labs (From admission, onward)    Start     Ordered   03/26/18 0805  Novel Coronavirus, NAA (hospital order; send-out to ref lab)  (Novel Coronavirus, NAA [facility] Order; send-out to ref lab) with precautions panel)  Once,   R    Question Answer Comment  Current symptoms Fever and Cough   Excluded other viral illnesses Yes   Exposure Risk Contact with a known COVID19 positive person in the last 14 days   Patient immune status Normal      03/26/18 0804   03/26/18 0731  Lactic acid, plasma  Now then every 2 hours,   STAT     03/26/18 0731   03/26/18 0731  Blood Culture (routine x 2)  BLOOD CULTURE X 2,   STAT     03/26/18 0731   03/26/18 0731  Urinalysis, Routine w reflex microscopic  ONCE - STAT,   STAT     03/26/18 0731          Vitals/Pain Today's Vitals   03/26/18 0726 03/26/18 0807 03/26/18 0915 03/26/18 1000  BP:   113/72 118/73  Pulse: (!) 109  (!) 103 (!) 103  Resp:   17 (!) 22  Temp:  TempSrc:      SpO2:  95% 96% 95%  Weight:      Height:        Isolation Precautions Droplet and Contact  precautions  Medications Medications  0.9 %  sodium chloride infusion ( Intravenous New Bag/Given 03/26/18 0810)  cefTRIAXone (ROCEPHIN) 2 g in sodium chloride 0.9 % 100 mL IVPB (0 g Intravenous Stopped 03/26/18 1005)  azithromycin (ZITHROMAX) 500 mg in sodium chloride 0.9 % 250 mL IVPB (500 mg Intravenous New Bag/Given 03/26/18 1053)  methylPREDNISolone sodium succinate (SOLU-MEDROL) 125 mg/2 mL injection 125 mg (125 mg Intravenous Given 03/26/18 8638)    Mobility walks with device Moderate fall risk   Focused Assessments Pulmonary Assessment Handoff:  Lung sounds: Bilateral Breath Sounds: Clear, Diminished O2 Device: CPAP O2 Flow Rate (L/min): 3 L/min      R Recommendations: See Admitting Provider Note  Report given to:   Additional Notes:  Droplet and contact precautions

## 2018-03-26 NOTE — Progress Notes (Signed)
Per health system leadership, therapy services are being held at this time until patient tests negative for COVID-19.  Blondell Reveal Kistler PT 03/26/2018  Acute Rehabilitation Services Pager [phone] Office [phone]

## 2018-03-26 NOTE — ED Notes (Signed)
Bed: WA18 Expected date:  Expected time:  Means of arrival:  Comments: Res a 

## 2018-03-26 NOTE — H&P (Signed)
History and Physical   Justin Walters NWG:[id number] DOB: 03-05-1940 DOA: 03/26/2018  Referring MD/NP/PA: Lacretia Leigh, MD, EDP PCP: Biagio Borg, MD Outpatient Specialists: Pulmonology, Dr. Melvyn Novas; Dr. Eveline Keto, Urology Patient coming from: Home  Chief Complaint: Dyspnea  HPI: Justin Walters is a 78 y.o. male with a history of 3L O2-dependent COPD , HTN, HLD, nephrolithiasis s/p ESWL 2019, BPH with LUTS who presented with acute dyspnea and wheezing. He reports 1-2 weeks of increased, intermittent, moderate wheezing improved with home nebulizer treatments, as well as slight increase in his chronic cough in that it is more productive, reported blue-green. Was evaluated yesterday by pulmonology, CXR showed infiltrate, started on augmentin and prednisone which he hasn't taken yet. He lives with his daughter who works in [employer] and has continued working, but isn't ill.   ED Course: Initially in significant respiratory distress requiring BiPAP, given nebs, solumedrol. Temp 100.59F, tachypneic, tachycardic. WBC 57.8I with neutrophilic predominance, lymphs 1.1k, creatinine 1.31, platelets 577, hgb 11. Flu negative, covid assay sent. Respiratory effort has improved, but has not returned to baseline, so admission was requested.  Review of Systems: He denies fever, chills, has some chest tightness when coughing only. No leg swelling, orthopnea, PND. He denies abdominal pain, dysuria, or change in urinary symptoms but does have LUTS from BPH which are stable. Otherwise per HPI. All others reviewed and are negative.   Past Medical History:  Diagnosis Date  . At risk for sleep apnea    STOP-BANG= 5    SENT TO PCP 06-13-2013  . Bladder stone   . BPH (benign prostatic hyperplasia)   . COPD (chronic obstructive pulmonary disease) ([place])   . Diverticulosis of colon   . Dysuria   . Glucose intolerance (impaired glucose tolerance)   . History of kidney stones   . Hyperlipidemia   . Hypertension   .  Ureteral disorder 2018   ureteral repair   Past Surgical History:  Procedure Laterality Date  . APPENDECTOMY  1980's  . CATARACT EXTRACTION W/ INTRAOCULAR LENS  IMPLANT, BILATERAL    . COLONOSCOPY  10-05-2003   tics only   . CYSTOSCOPY N/A 06/23/2013   Procedure: CYSTOSCOPY WITH LITHOLAPAXY, BLADDER EXPLORATION, REPAIR BLADDER PERFORATION;  Surgeon: Arvil Persons, MD;  Location: [facility];  Service: Urology;  Laterality: N/A;  . CYSTOSCOPY/URETEROSCOPY/HOLMIUM LASER/STENT PLACEMENT Left 04/03/2017   Procedure: CYSTOSCOPY LEFT RETROGRADE/LEFT /URETEROSCOPY/HOLMIUM LASER/BASKET STONE EXTRACTION/STENT PLACEMENT/ URETHRAL BIOPSY;  Surgeon: [name], MD;  Location: [facility];  Service: Urology;  Laterality: Left;  . EXTRACORPOREAL SHOCK WAVE LITHOTRIPSY Left 10/23/2016   Procedure: LEFT EXTRACORPOREAL SHOCK WAVE LITHOTRIPSY (ESWL);  Surgeon: Nickie Retort, MD;  Location: [facility];  Service: Urology;  Laterality: Left;  . EXTRACORPOREAL SHOCK WAVE LITHOTRIPSY Left 01/08/2017   Procedure: LEFT EXTRACORPOREAL SHOCK WAVE LITHOTRIPSY (ESWL);  Surgeon: [name], MD;  Location: [facility];  Service: Urology;  Laterality: Left;  . HOLMIUM LASER APPLICATION N/A 6/96/2952   Procedure: HOLMIUM LASER APPLICATION;  Surgeon: Arvil Persons, MD;  Location: [facility];  Service: Urology;  Laterality: N/A;  . LAPAROSCOPIC INGUINAL HERNIA REPAIR Bilateral 10-09-2003  . TRANSURETHRAL RESECTION OF PROSTATE N/A 06/23/2013   Procedure: TRANSURETHRAL RESECTION OF THE PROSTATE;  Surgeon: Arvil Persons, MD;  Location[facility];  Service: Urology;  Laterality: N/A;   - History of smoking, no recent EtOH or illicit drugs. No recent travel. Lives with daughter who continues to work in [employer].  reports  that he quit smoking about 25 years ago. His smoking use included cigarettes. He has a 102.00 pack-year smoking history. He has never used smokeless tobacco. He reports  that he does not drink alcohol or use drugs. No Known Allergies Family History  Problem Relation Age of Onset  . COPD Mother   . Cancer Father        unknown  . Emphysema Brother   . Heart failure Brother   . Colon cancer Neg Hx   . Esophageal cancer Neg Hx   . Rectal cancer Neg Hx   . Stomach cancer Neg Hx    - Family history otherwise reviewed and not pertinent.  Prior to Admission medications   Medication Sig Start Date End Date Taking? Authorizing Provider  albuterol (PROVENTIL HFA;VENTOLIN HFA) 108 (90 Base) MCG/ACT inhaler INHALE 2 PUFFS INTO THE LUNGS EVERY 6 (SIX) HOURS AS NEEDED FOR WHEEZING OR SHORTNESS OF BREATH. 05/15/17   Biagio Borg, MD  albuterol (PROVENTIL) (2.5 MG/3ML) 0.083% nebulizer solution Take 3 mLs (2.5 mg total) by nebulization every 4 (four) hours as needed for wheezing or shortness of breath. 03/08/18   Tanda Rockers, MD  amLODipine (NORVASC) 5 MG tablet TAKE 1 TABLET EVERY DAY Patient taking differently: Take 5 mg by mouth daily.  01/23/18   Biagio Borg, MD  aspirin 81 MG EC tablet Take 1 tablet (81 mg total) by mouth daily. Swallow whole. 01/09/17   [name], MD  carvedilol (COREG) 12.5 MG tablet TAKE 1 TABLET TWICE DAILY WITH MEALS Patient taking differently: Take 12.5 mg by mouth 2 (two) times daily with a meal.  08/07/17   Biagio Borg, MD  famotidine (PEPCID) 20 MG tablet Take 20 mg by mouth at bedtime.    [provider]  gabapentin (NEURONTIN) 300 MG capsule TAKE 1 CAPSULE THREE TIMES DAILY Patient taking differently: Take 300 mg by mouth 3 (three) times daily. TAKE 1 CAPSULE THREE TIMES DAILY 01/11/18   Biagio Borg, MD  Guaifenesin [facility] MAXIMUM STRENGTH) 1200 MG TB12 Take 1 tablet by mouth 2 (two) times daily.    [provider]  ipratropium-albuterol (DUONEB) 0.5-2.5 (3) MG/3ML SOLN 1 vial in nebulizer 4 x daily 03/22/18   [provider]  omeprazole (PRILOSEC) 20 MG capsule Take 30- 60 min before your first and  last meals of the day Patient not taking: Reported on 03/25/2018 12/05/17   Tanda Rockers, MD  OXYGEN 2 lpm with sleep and exertion if needed [facility]    [provider]  pantoprazole (PROTONIX) 40 MG tablet Take 1 tablet (40 mg total) by mouth daily. Take 30-60 min before first meal of the day 03/25/18   Tanda Rockers, MD  predniSONE (DELTASONE) 10 MG tablet Take  4 each am x 2 days,   2 each am x 2 days,  1 each am x 2 days and stop 03/25/18   Tanda Rockers, MD  Respiratory Therapy Supplies (FLUTTER) DEVI Use as directed 12/05/17   Tanda Rockers, MD  rosuvastatin (CRESTOR) 20 MG tablet TAKE 1 TABLET EVERY DAY Patient taking differently: Take 20 mg by mouth daily.  08/07/17   Biagio Borg, MD  terazosin (HYTRIN) 10 MG capsule TAKE 1 CAPSULE AT BEDTIME Patient taking differently: Take 10 mg by mouth at bedtime.  02/13/18   Biagio Borg, MD  umeclidinium-vilanterol (ANORO ELLIPTA) 62.5-25 MCG/INH AEPB Inhale 1 puff into the lungs daily. 03/25/18   Tanda Rockers,  MD    Physical Exam: Vitals:   03/26/18 0726 03/26/18 0807 03/26/18 0915 03/26/18 1000  BP:   113/72 118/73  Pulse: (!) 109  (!) 103 (!) 103  Resp:   17 (!) 22  Temp:      TempSrc:      SpO2:  95% 96% 95%  Weight:      Height:       Constitutional: 78 y.o. male in no distress, calm demeanor Eyes: Lids and conjunctivae normal, PERRL ENMT: Mucous membranes are moist. Posterior pharynx clear of any exudate or lesions. Poor dentition.  Neck: normal, supple, no masses, no thyromegaly Respiratory: Tachypneic with short sentences, end-expiratory wheezing and bibasiler crackles. Cardiovascular: Regular rate and rhythm, no murmurs, rubs, or gallops. No carotid bruits. No JVD. No LE edema. Palpable pedal pulses. Abdomen: Normoactive bowel sounds. No tenderness, non-distended, and no masses palpated. No hepatosplenomegaly. GU: No indwelling catheter Musculoskeletal: No clubbing / cyanosis. No joint deformity upper and  lower extremities. Good ROM, no contractures. Normal muscle tone.  Skin: Warm, dry. No rashes, wounds, or ulcers. No significant lesions noted.  Neurologic: CN II-XII grossly intact. Speech normal. No focal deficits in motor strength or sensation in all extremities.  Psychiatric: Alert and oriented x3. Normal judgment and insight. Mood anxious with broad affect.   Labs on Admission: I have personally reviewed following labs and imaging studies  CBC: Recent Labs  Lab 03/25/18 1037 03/26/18 0718  WBC 11.3* 16.8*  NEUTROABS 8.3* 14.0*  HGB 10.3* 11.0*  HCT 31.6* 38.0*  MCV 87.1 94.3  PLT 546.0* 998*   Basic Metabolic Panel: Recent Labs  Lab 03/26/18 0718  NA 138  K 4.3  CL 98  CO2 29  GLUCOSE 143*  BUN 18  CREATININE 1.31*  CALCIUM 8.9   GFR: Estimated Creatinine Clearance: 44.2 mL/min (A) (by C-G formula based on SCr of 1.31 mg/dL (H)). Liver Function Tests: No results for input(s): AST, ALT, ALKPHOS, BILITOT, PROT, ALBUMIN in the last 168 hours. No results for input(s): LIPASE, AMYLASE in the last 168 hours. No results for input(s): AMMONIA in the last 168 hours. Coagulation Profile: No results for input(s): INR, PROTIME in the last 168 hours. Cardiac Enzymes: No results for input(s): CKTOTAL, CKMB, CKMBINDEX, TROPONINI in the last 168 hours. BNP (last 3 results) Recent Labs    02/22/18 1055  PROBNP 137.0*   HbA1C: No results for input(s): HGBA1C in the last 72 hours. CBG: No results for input(s): GLUCAP in the last 168 hours. Lipid Profile: No results for input(s): CHOL, HDL, LDLCALC, TRIG, CHOLHDL, LDLDIRECT in the last 72 hours. Thyroid Function Tests: No results for input(s): TSH, T4TOTAL, FREET4, T3FREE, THYROIDAB in the last 72 hours. Anemia Panel: No results for input(s): VITAMINB12, FOLATE, FERRITIN, TIBC, IRON, RETICCTPCT in the last 72 hours. Urine analysis:    Component Value Date/Time   COLORURINE YELLOW 07/06/2017 0958   APPEARANCEUR CLEAR  07/06/2017 0958   LABSPEC 1.025 07/06/2017 0958   PHURINE 5.5 07/06/2017 0958   GLUCOSEU NEGATIVE 07/06/2017 0958   HGBUR NEGATIVE 07/06/2017 0958   HGBUR moderate 07/26/2007 0924   BILIRUBINUR SMALL (A) 07/06/2017 0958   KETONESUR TRACE (A) 07/06/2017 0958   PROTEINUR 30 (A) 08/04/2013 0620   UROBILINOGEN 0.2 07/06/2017 0958   NITRITE NEGATIVE 07/06/2017 0958   LEUKOCYTESUR NEGATIVE 07/06/2017 0958    No results found for this or any previous visit (from the past 240 hour(s)).   Radiological Exams on Admission: Dg Chest 2 View  Result Date: 03/25/2018 CLINICAL DATA:  Follow-up pulmonary infiltrates EXAM: CHEST - 2 VIEW COMPARISON:  03/08/2018 FINDINGS: Cardiac shadow is stable. Patchy airspace opacity is again identified relatively increased from the prior exam. Additionally some more focal infiltrates are seen in the left base projecting on the lateral film in the lingula and left lower lobe. No sizable effusion is seen. No bony abnormality is noted. IMPRESSION: Persistent and slightly increased peripheral opacities with some more acute infiltrative density seen in the bases. CT may be helpful for further evaluation. Electronically Signed   By: Inez Catalina M.D.   On: 03/25/2018 11:00   Dg Chest Port 1 View  Result Date: 03/26/2018 CLINICAL DATA:  Respiratory distress EXAM: PORTABLE CHEST 1 VIEW COMPARISON:  March 25, 2018 and October 31, 2017 chest radiographs FINDINGS: Lungs are hyperexpanded with scattered areas of scarring. There is persistent patchy airspace opacity in the lung bases. Elsewhere, there is somewhat less interstitial thickening and patchy consolidation, although there is underlying reticular interstitial disease fairly diffusely throughout the lungs. The heart size and pulmonary vascularity are normal. No adenopathy. There is aortic atherosclerosis as well as foci of coronary artery calcification. Bones are osteoporotic. IMPRESSION: Lungs hyperexpanded. Patchy airspace  opacity in the lung bases is likely due to pneumonia. Less coarse interstitial thickening and patchy airspace opacity in the upper and mid lungs compared to 1 day prior. There remains underlying reticular interstitial disease, probably enlarged part due to fibrosis. No new opacity evident. Heart size within normal limits. There is not felt to be cardiogenic edema present. No adenopathy evident. Aortic Atherosclerosis (ICD10-I70.0). There also foci of coronary artery calcification. Electronically Signed   By: Lowella Grip III M.D.   On: 03/26/2018 08:59    EKG: Independently reviewed. Sinus tachycardia at 109bpm, No ischemic changes. QTc wnl.  Assessment/Plan Principal Problem:   Acute on chronic respiratory failure with hypoxia and hypercapnia (HCC) Active Problems:   Hyperlipidemia   Essential hypertension   COPD exacerbation (HCC)    Acute on chronic hypoxic and hypercarbic respiratory failure: Due to pneumonia vs. viral pneumonia, covid possible. PCT undetectable arguing against bacterial source. No lymphopenia. Lives with daughter who is not socially isolating. Flu negative. At high risk for complications due to age, COPD. - Wean back to 3L O2 as able, off BiPAP for now. Monitor in SDU. - Continue CTX, azithromycin (which may be of benefit w/covid). MRSA negative. - Due to high risk (NIPPV): Will institute airborne + contact precautions w/eye protection.  - Pt to wear surgical mask as able. - PPE including gown, gloves, face shield, N-95 was used and observed during this encounter.  - Infection prevention and bed control made aware.  - With ongoing wheezing, feel risk of steroids outweighed by benefit. solumedrol 40mg  q12h tonight and tomorrow, deescalate as able based on exam.  - SARS-CoV-2 testing, blood cultures pending; sputum culture ordered.  - MDI (combivent q6h + albuterol prn) substituted for nebs. - Note CXR demonstrated patchy bibasilar opacities felt to represent  pneumonia, on background of reticular opacities, possibly fibrosis.   Pyuria: Asymptomatic. In setting of LUTS from BPH. Anticipate any asymptomatic bacteriuria would be treated with antibiotics as above.  - Continue terazosin  GERD, UACS:  - Continue pepcid qHS, PPI qAM  HTN:  - Continue norvasc and coreg  Stage III CKD: Based on current creatinine which appears at baseline. - Avoid nephrotoxins  DVT prophylaxis: Lovenox  Code Status: Full, confirmed at admission Family Communication: None at bedside, did  not reach family this morning by phone. Disposition Plan: Uncertain Consults called: None  Admission status: Inpatient  The appropriate admission status for this patient is INPATIENT. Inpatient status is judged to be reasonable and necessary in order to provide the required intensity of service to ensure the patient's safety. The patient's presenting symptoms, physical exam findings, and initial radiographic and laboratory data in the context of their chronic comorbidities is felt to place them at high risk for further clinical deterioration. Furthermore, it is not anticipated that the patient will be medically stable for discharge from the hospital within 2 midnights of admission. The following factors support the admission status of inpatient.    The patient's presenting symptoms include dyspnea, fever.  The worrisome physical exam findings include fever, tachypnea, respiratory distress.  The initial radiographic and laboratory data are worrisome because of leukocytosis, creatinine elevation, pyuria,   The chronic co-morbidities include chronic respiratory failure, COPD, CKD, HTN.  Patient requires inpatient status due to high intensity of service, high risk for further deterioration and high frequency of surveillance required.  I certify that at the point of admission it is my clinical judgment that the patient will require inpatient hospital care spanning beyond 2 midnights  from the point of admission.      Patrecia Pour, MD [facility] [email or web address] Password [facility] 03/26/2018, 10:51 AM

## 2018-03-26 NOTE — ED Provider Notes (Signed)
[facility] DEPT Provider Note   CSN: [id number] Arrival date & time: 03/26/18  1517    History   Chief Complaint No chief complaint on file.   HPI Justin Walters is a 78 y.o. male.     78 year old male with history of COPD presents with respiratory distress.  Patient is chronically on 3 L of oxygen at home.  Patient saw his doctor yesterday because of increasing shortness of breath.  Had an x-ray of the chest at his doctor's office which showed increasing infiltrate.  Patient was placed on Augmentin as well as prednisone.  at home he started developing increasing cough and congestion and more dyspnea.  No myalgias.  Denies any fever or chills.  No vomiting or diarrhea.  No chest or abdominal discomfort.  Patient is on home oxygen chronically and has home nebulizers and was using those with without relief.  EMS was called and patient given albuterol continuous treatment along with being placed on BiPAP and transported here.  Patient notes improvement since these therapies were started     Past Medical History:  Diagnosis Date  . At risk for sleep apnea    STOP-BANG= 5    SENT TO PCP 06-13-2013  . Bladder stone   . BPH (benign prostatic hyperplasia)   . COPD (chronic obstructive pulmonary disease) ([place])   . Diverticulosis of colon   . Dysuria   . Glucose intolerance (impaired glucose tolerance)   . History of kidney stones   . Hyperlipidemia   . Hypertension   . Ureteral disorder 2018   ureteral repair    Patient Active Problem List   Diagnosis Date Noted  . Pulmonary infiltrates on CXR 02/24/2018  . DOE (dyspnea on exertion) 02/22/2018  . Chronic respiratory failure with hypoxia ([place]) 02/22/2018  . Upper airway cough syndrome 11/22/2017  . Occipital neuralgia 08/14/2017  . Right arm pain 08/14/2017  . GERD (gastroesophageal reflux disease) 05/24/2017  . Thrush 01/18/2017  . Bilateral hearing loss 07/19/2016  . Dizziness 01/04/2016  .  Preop exam for internal medicine 01/04/2016  . Cough 04/07/2015  . COPD exacerbation ([place]) 04/07/2015  . Gastroenteritis 01/16/2015  . Skin soreness 05/15/2014  . Pelvic pain in male 03/28/2012  . Headache 03/28/2012  . Chest pain, unspecified 03/28/2012  . Impaired glucose tolerance 01/14/2011  . Preventative health care 01/14/2011  . Encounter for long-term (current) use of other medications 01/09/2011  . BENIGN PROSTATIC HYPERTROPHY 01/08/2009  . ERECTILE DYSFUNCTION 04/07/2008  . CKD (chronic kidney disease) 10/03/2007  . SKIN LESION 10/03/2007  . PSA, INCREASED 10/03/2007  . Dysuria 07/26/2007  . DIVERTICULOSIS, COLON 02/15/2007  . Hyperlipidemia 09/23/2006  . Essential hypertension 09/23/2006  . RENAL CALCULUS, HX OF 09/23/2006  . COPD GOLD III 08/01/2006    Past Surgical History:  Procedure Laterality Date  . APPENDECTOMY  1980's  . CATARACT EXTRACTION W/ INTRAOCULAR LENS  IMPLANT, BILATERAL    . COLONOSCOPY  10-05-2003   tics only   . CYSTOSCOPY N/A 06/23/2013   Procedure: CYSTOSCOPY WITH LITHOLAPAXY, BLADDER EXPLORATION, REPAIR BLADDER PERFORATION;  Surgeon: Arvil Persons, MD;  Location: [facility];  Service: Urology;  Laterality: N/A;  . CYSTOSCOPY/URETEROSCOPY/HOLMIUM LASER/STENT PLACEMENT Left 04/03/2017   Procedure: CYSTOSCOPY LEFT RETROGRADE/LEFT /URETEROSCOPY/HOLMIUM LASER/BASKET STONE EXTRACTION/STENT PLACEMENT/ URETHRAL BIOPSY;  Surgeon: [name], MD;  Location: [facility];  Service: Urology;  Laterality: Left;  . EXTRACORPOREAL SHOCK WAVE LITHOTRIPSY Left 10/23/2016   Procedure: LEFT EXTRACORPOREAL SHOCK WAVE LITHOTRIPSY (ESWL);  Surgeon: Nickie Retort, MD;  Location: [facility];  Service: Urology;  Laterality: Left;  . EXTRACORPOREAL SHOCK WAVE LITHOTRIPSY Left 01/08/2017   Procedure: LEFT EXTRACORPOREAL SHOCK WAVE LITHOTRIPSY (ESWL);  Surgeon: [name], MD;  Location: [facility];  Service: Urology;  Laterality: Left;  . HOLMIUM LASER  APPLICATION N/A 05/07/3974   Procedure: HOLMIUM LASER APPLICATION;  Surgeon: Arvil Persons, MD;  Location: [facility];  Service: Urology;  Laterality: N/A;  . LAPAROSCOPIC INGUINAL HERNIA REPAIR Bilateral 10-09-2003  . TRANSURETHRAL RESECTION OF PROSTATE N/A 06/23/2013   Procedure: TRANSURETHRAL RESECTION OF THE PROSTATE;  Surgeon: Arvil Persons, MD;  Location: [facility];  Service: Urology;  Laterality: N/A;        Home Medications    Prior to Admission medications   Medication Sig Start Date End Date Taking? Authorizing Provider  albuterol (PROVENTIL HFA;VENTOLIN HFA) 108 (90 Base) MCG/ACT inhaler INHALE 2 PUFFS INTO THE LUNGS EVERY 6 (SIX) HOURS AS NEEDED FOR WHEEZING OR SHORTNESS OF BREATH. 05/15/17   Biagio Borg, MD  albuterol (PROVENTIL) (2.5 MG/3ML) 0.083% nebulizer solution Take 3 mLs (2.5 mg total) by nebulization every 4 (four) hours as needed for wheezing or shortness of breath. 03/08/18   Tanda Rockers, MD  amLODipine (NORVASC) 5 MG tablet TAKE 1 TABLET EVERY DAY 01/23/18   Biagio Borg, MD  aspirin 81 MG EC tablet Take 1 tablet (81 mg total) by mouth daily. Swallow whole. 01/09/17   [name], MD  carvedilol (COREG) 12.5 MG tablet TAKE 1 TABLET TWICE DAILY WITH MEALS 08/07/17   Biagio Borg, MD  famotidine (PEPCID) 20 MG tablet Take 20 mg by mouth at bedtime.    [provider]  gabapentin (NEURONTIN) 300 MG capsule TAKE 1 CAPSULE THREE TIMES DAILY 01/11/18   Biagio Borg, MD  Guaifenesin [facility] MAXIMUM STRENGTH) 1200 MG TB12 Take 1 tablet by mouth 2 (two) times daily.    [provider]  ipratropium-albuterol (DUONEB) 0.5-2.5 (3) MG/3ML SOLN 1 vial in nebulizer 4 x daily 03/22/18   [provider]  omeprazole (PRILOSEC) 20 MG capsule Take 30- 60 min before your first and last meals of the day Patient not taking: Reported on 03/25/2018 12/05/17   Tanda Rockers, MD  OXYGEN 2 lpm with sleep and exertion if needed [facility]    [provider]  pantoprazole (PROTONIX) 40 MG tablet Take 1 tablet (40 mg total) by mouth daily. Take 30-60 min before first meal of the day 03/25/18   Tanda Rockers, MD  predniSONE (DELTASONE) 10 MG tablet Take  4 each am x 2 days,   2 each am x 2 days,  1 each am x 2 days and stop 03/25/18   Tanda Rockers, MD  Respiratory Therapy Supplies (FLUTTER) DEVI Use as directed 12/05/17   Tanda Rockers, MD  rosuvastatin (CRESTOR) 20 MG tablet TAKE 1 TABLET EVERY DAY Patient taking differently: Take 20 mg by mouth daily.  08/07/17   Biagio Borg, MD  terazosin (HYTRIN) 10 MG capsule TAKE 1 CAPSULE AT BEDTIME 02/13/18   Biagio Borg, MD  umeclidinium-vilanterol [facility] ELLIPTA) 62.5-25 MCG/INH AEPB Inhale 1 puff into the lungs daily. 03/25/18   Tanda Rockers, MD    Family History Family History  Problem Relation Age of Onset  . COPD Mother   . Cancer Father        unknown  . Emphysema Brother   .  Heart failure Brother   . Colon cancer Neg Hx   . Esophageal cancer Neg Hx   . Rectal cancer Neg Hx   . Stomach cancer Neg Hx     Social History Social History   Tobacco Use  . Smoking status: Former Smoker    Packs/day: 3.00    Years: 34.00    Pack years: 102.00    Types: Cigarettes    Last attempt to quit: 01/02/1993    Years since quitting: 25.2  . Smokeless tobacco: Never Used  Substance Use Topics  . Alcohol use: No    Alcohol/week: 4.0 standard drinks    Types: 4 Standard drinks or equivalent per week    Frequency: Never  . Drug use: No     Allergies   Patient has no known allergies.   Review of Systems Review of Systems  All other systems reviewed and are negative.    Physical Exam Updated Vital Signs BP 131/83   Temp 100.2 F (37.9 C) (Rectal)   Resp (!) 32   SpO2 95%   Physical Exam Vitals signs and nursing note reviewed.  Constitutional:      General: He is not in acute distress.    Appearance: Normal appearance. He is well-developed.  He is not toxic-appearing.  HENT:     Head: Normocephalic and atraumatic.  Eyes:     General: Lids are normal.     Conjunctiva/sclera: Conjunctivae normal.     Pupils: Pupils are equal, round, and reactive to light.  Neck:     Musculoskeletal: Normal range of motion and neck supple.     Thyroid: No thyroid mass.     Trachea: No tracheal deviation.  Cardiovascular:     Rate and Rhythm: Normal rate and regular rhythm.     Heart sounds: Normal heart sounds. No murmur. No gallop.   Pulmonary:     Effort: Tachypnea, prolonged expiration and respiratory distress present.     Breath sounds: No stridor. Examination of the right-upper field reveals decreased breath sounds and wheezing. Examination of the left-upper field reveals decreased breath sounds and wheezing. Decreased breath sounds and wheezing present. No rhonchi or rales.  Abdominal:     General: Bowel sounds are normal. There is no distension.     Palpations: Abdomen is soft.     Tenderness: There is no abdominal tenderness. There is no rebound.  Musculoskeletal: Normal range of motion.        General: No tenderness.  Skin:    General: Skin is warm and dry.     Findings: No abrasion or rash.  Neurological:     Mental Status: He is alert and oriented to person, place, and time.     GCS: GCS eye subscore is 4. GCS verbal subscore is 5. GCS motor subscore is 6.     Cranial Nerves: No cranial nerve deficit.     Sensory: No sensory deficit.  Psychiatric:        Speech: Speech normal.        Behavior: Behavior normal.      ED Treatments / Results  Labs (all labs ordered are listed, but only abnormal results are displayed) Labs Reviewed  CBC WITH DIFFERENTIAL/PLATELET  BASIC METABOLIC PANEL  INFLUENZA PANEL BY PCR (TYPE A & B)    EKG EKG Interpretation  Date/Time:  Tuesday March 26 2018 07:27:20 EDT Ventricular Rate:  109 PR Interval:    QRS Duration: 71 QT Interval:  322 QTC Calculation: 434 R  Axis:   52 Text  Interpretation:  Sinus tachycardia Ventricular premature complex Aberrant complex RSR' in V1 or V2, probably normal variant Confirmed by Lacretia Leigh ([id number]) on 03/26/2018 7:34:15 AM   Radiology Dg Chest 2 View  Result Date: 03/25/2018 CLINICAL DATA:  Follow-up pulmonary infiltrates EXAM: CHEST - 2 VIEW COMPARISON:  03/08/2018 FINDINGS: Cardiac shadow is stable. Patchy airspace opacity is again identified relatively increased from the prior exam. Additionally some more focal infiltrates are seen in the left base projecting on the lateral film in the lingula and left lower lobe. No sizable effusion is seen. No bony abnormality is noted. IMPRESSION: Persistent and slightly increased peripheral opacities with some more acute infiltrative density seen in the bases. CT may be helpful for further evaluation. Electronically Signed   By: Inez Catalina M.D.   On: 03/25/2018 11:00    Procedures Procedures (including critical care time)  Medications Ordered in ED Medications  0.9 %  sodium chloride infusion (has no administration in time range)  methylPREDNISolone sodium succinate (SOLU-MEDROL) 125 mg/2 mL injection 125 mg (125 mg Intravenous Given 03/26/18 7680)     Initial Impression / Assessment and Plan / ED Course  I have reviewed the triage vital signs and the nursing notes.  Pertinent labs & imaging results that were available during my care of the patient were reviewed by me and considered in my medical decision making (see chart for details).       Patient placed on droplet and contact precautions.  Patient given Solu-Medrol. Patient with evidence of pneumonia on chest x-ray. Lactate noted.  Flu test negative.  Patient started on IV antibiotics.  Was continued on BiPAP on arrival here due to respiratory distress.  He has greatly improved and is conversational.  Attempted to take off BiPAP but then became more dyspneic.  Will wait at this time. Patient to be admitted to the hospitalist  service  CRITICAL CARE Performed by: Leota Jacobsen Total critical care time: 60 minutes Critical care time was exclusive of separately billable procedures and treating other patients. Critical care was necessary to treat or prevent imminent or life-threatening deterioration. Critical care was time spent personally by me on the following activities: development of treatment plan with patient and/or surrogate as well as nursing, discussions with consultants, evaluation of patient's response to treatment, examination of patient, obtaining history from patient or surrogate, ordering and performing treatments and interventions, ordering and review of laboratory studies, ordering and review of radiographic studies, pulse oximetry and re-evaluation of patient's condition.  Final Clinical Impressions(s) / ED Diagnoses   Final diagnoses:  None    ED Discharge Orders    None       Lacretia Leigh, MD 03/26/18 [phone]

## 2018-03-26 NOTE — ED Notes (Signed)
Pt's son: Jaimere Feutz [phone]

## 2018-03-26 NOTE — ED Notes (Signed)
Bed: RESA Expected date:  Expected time:  Means of arrival:  Comments: EMS resp distress 

## 2018-03-26 NOTE — Telephone Encounter (Signed)
LMTCB

## 2018-03-26 NOTE — Progress Notes (Signed)
OT Cancellation Note  Patient Details Name: Justin Walters MRN: [id number] DOB: 1940/11/05   Cancelled Treatment:    Reason Eval/Treat Not Completed: Other (comment)   Per health system leadership, therapy services are being held at this time until patient tests negative for COVID-19.   Lin Landsman Mickel Baas, OT Acute Rehabilitation Services [phone] [phone]    03/26/2018, 3:36 PM

## 2018-03-26 NOTE — Progress Notes (Signed)
Initial Nutrition Assessment  RD working remotely.   DOCUMENTATION CODES:   Not applicable  INTERVENTION:  - Diet advancement as medically feasible. - Will order Ensure Enlive BID for once diet advanced to at least FLD, each supplement provides 350 kcal and 20 grams of protein. - Will order daily multivitamin with minerals.   NUTRITION DIAGNOSIS:   Inadequate oral intake related to inability to eat as evidenced by NPO status.  GOAL:   Patient will meet greater than or equal to 90% of their needs  MONITOR:   Diet advancement, PO intake, Supplement acceptance, Weight trends, Labs  REASON FOR ASSESSMENT:   Consult Assessment of nutrition requirement/status  ASSESSMENT:   78 year old male with history of COPD presented to the ED with respiratory distress. Patient is chronically on 3L of O2 at home. Patient saw his doctor 3/23 d/t increasing SOB. He had CXR in PCP office which showed increasing infiltrates. He was placed on augmentin and prednisone. At home he started developing increasing cough and congestion and more dyspnea. No vomiting or diarrhea.  No chest or abdominal discomfort.  EMS was called and he was placed on BiPAP and given continuous albuterol treatment. Patient noted improvement since these therapies were started.  BMI indicates normal weight. Patient has been NPO since admission. RT note from this AM indicates that patient was taken off of BiPAP and placed on 3L [place]. Per chart review, current weight is 155 lb and weight on 2/21 was 160 lb. This indicates 5 lb weight loss (3% body weight) in the past 1 month; not significant for time frame.     Medications reviewed; 125 mg solu-medrol x1 dose on 3/24. Labs reviewed; creatinine: 1.31 mg/dl, GFR: 52 ml/min. IVF; NS @ 125 ml/hr.      NUTRITION - FOCUSED PHYSICAL EXAM:  Unable to complete at this time.  Diet Order:   Diet Order            Diet NPO time specified  Diet effective now               EDUCATION NEEDS:   No education needs have been identified at this time  Skin:  Skin Assessment: Reviewed RN Assessment  Last BM:  PTA/unknown  Height:   Ht Readings from Last 1 Encounters:  03/26/18 5\' 7"  (1.702 m)    Weight:   Wt Readings from Last 1 Encounters:  03/26/18 70.3 kg    Ideal Body Weight:  67.27 kg  BMI:  Body mass index is 24.28 kg/m.  Estimated Nutritional Needs:   Kcal:  1760-1970 kcal  Protein:  70-85 grams  Fluid:  >/= 1.8 L/day     Jarome Matin, MS, RD, LDN, [facility] Inpatient Clinical Dietitian Pager # [phone] After hours/weekend pager # [phone]

## 2018-03-26 NOTE — Progress Notes (Signed)
Assisted with transporting PT from [facility] ED Resp A to [facility] ED 18.

## 2018-03-26 NOTE — Progress Notes (Signed)
Per MD request PT was removed from BiPAP and placed on 3 lpm  (as used at home). PT states he is breathing better than at arrival. RN aware.

## 2018-03-26 NOTE — ED Triage Notes (Signed)
Pt from home.  Pt hx COPD.  Pt c/o of SOB all night, the last 6 hours have been worse.  Pt took own nebs at home.  EMS gave him 10 mg albuterol and 0.5 atrovent.  On arrival respirations were 40 times per min.  Pt arrived on CPAP. Fire reported pt's O2 was 87% on arrival.

## 2018-03-26 NOTE — Telephone Encounter (Signed)
Called pt again- per spouse he is hospitalized He woke up early this am and "could not breathe at all"- so she called EMS  She states he is feeling some better already  Will forward to Dr Melvyn Novas so he is aware

## 2018-03-26 NOTE — ED Notes (Addendum)
Pt notified of needed urine sample.  Urinal at bedside

## 2018-03-26 NOTE — Telephone Encounter (Signed)
-----   Message from Tanda Rockers, MD sent at 03/25/2018  4:55 PM EDT ----- See message below  ----- Message ----- From: Tanda Rockers, MD Sent: 03/25/2018   1:33 PM EDT To: Tanda Rockers, MD  Advise to permanently stop norvasc until further notice - it's redundant with hytrin anyway and actually has been reported to cause low 02 in pts with underlying lung dz

## 2018-03-27 LAB — BASIC METABOLIC PANEL
Anion gap: 8 (ref 5–15)
BUN: 18 mg/dL (ref 8–23)
CO2: 26 mmol/L (ref 22–32)
Calcium: 8 mg/dL — ABNORMAL LOW (ref 8.9–10.3)
Chloride: 104 mmol/L (ref 98–111)
Creatinine, Ser: 0.9 mg/dL (ref 0.61–1.24)
GFR calc Af Amer: >60 mL/min (ref >60)
GFR calc non Af Amer: >60 mL/min (ref >60)
Glucose, Bld: 140 mg/dL — ABNORMAL HIGH (ref 70–99)
Potassium: 3.6 mmol/L (ref 3.5–5.1)
Sodium: 138 mmol/L (ref 135–145)

## 2018-03-27 LAB — CBC WITH DIFFERENTIAL/PLATELET
Abs Immature Granulocytes: 0.16 10*3/uL — ABNORMAL HIGH (ref 0.00–0.07)
Basophils Absolute: 0 10*3/uL (ref 0.0–0.1)
Basophils Relative: 0 %
EOS ABS: 0 10*3/uL (ref 0.0–0.5)
Eosinophils Relative: 0 %
HCT: 31.8 % — ABNORMAL LOW (ref 39.0–52.0)
Hemoglobin: 9.3 g/dL — ABNORMAL LOW (ref 13.0–17.0)
IMMATURE GRANULOCYTES: 1 %
LYMPHS ABS: 1 10*3/uL (ref 0.7–4.0)
Lymphocytes Relative: 6 %
MCH: 27.4 pg (ref 26.0–34.0)
MCHC: 29.2 g/dL — AB (ref 30.0–36.0)
MCV: 93.8 fL (ref 80.0–100.0)
Monocytes Absolute: 0.3 10*3/uL (ref 0.1–1.0)
Monocytes Relative: 2 %
Neutro Abs: 14.5 10*3/uL — ABNORMAL HIGH (ref 1.7–7.7)
Neutrophils Relative %: 91 %
Platelets: 465 10*3/uL — ABNORMAL HIGH (ref 150–400)
RBC: 3.39 MIL/uL — ABNORMAL LOW (ref 4.22–5.81)
RDW: 14 % (ref 11.5–15.5)
WBC: 16 10*3/uL — ABNORMAL HIGH (ref 4.0–10.5)
nRBC: 0 % (ref 0.0–0.2)

## 2018-03-27 LAB — HIV ANTIBODY (ROUTINE TESTING W REFLEX): HIV Screen 4th Generation wRfx: NONREACTIVE

## 2018-03-27 NOTE — Progress Notes (Signed)
PROGRESS NOTE    Justin Walters  KDT:[id number] DOB: June 04, 1940 DOA: 03/26/2018 PCP: Biagio Borg, MD   Brief Narrative  78 y.o. male with a history of 3L O2-dependent COPD , HTN, HLD, nephrolithiasis s/p ESWL 2019, BPH with LUTS who presented with acute dyspnea and wheezing. He reports 1-2 weeks of increased, intermittent, moderate wheezing improved with home nebulizer treatments, as well as slight increase in his chronic cough in that it is more productive, reported blue-green. Was evaluated yesterday by pulmonology, CXR showed infiltrate, started on augmentin and prednisone which he hasn't taken yet. He lives with his daughter who works in [employer] and has continued working, but isn't ill.   ED Course: Initially in significant respiratory distress requiring BiPAP, given nebs, solumedrol. Temp 100.24F, tachypneic, tachycardic. WBC 99.8P with neutrophilic predominance, lymphs 1.1k, creatinine 1.31, platelets 577, hgb 11. Flu negative, covid assay sent. Respiratory effort has improved, but has not returned to baseline, so admission was requested.  Review of Systems: He denies fever, chills, has some chest tightness when coughing only. No leg swelling, orthopnea, PND. He denies abdominal pain, dysuria, or change in urinary symptoms but does have LUTS from BPH which are stable. Otherwise per HPI. All others reviewed and are negative.    Assessment & Plan:   Principal Problem:   Acute on chronic respiratory failure with hypoxia and hypercapnia (HCC) Active Problems:   Hyperlipidemia   Essential hypertension   CKD (chronic kidney disease), stage III (HCC)   BPH (benign prostatic hyperplasia)   RENAL CALCULUS, HX OF   COPD exacerbation (HCC)   GERD (gastroesophageal reflux disease)   #1 acute on chronic hypoxic hypercapnic respiratory failure secondary to COPD exacerbation and possible pneumonia viral versus bacterial.  Patient is on 3 L of oxygen prior to admission to the hospital.  He  lives at home with his daughter.  His daughter works in [employer] and is continuing to work.  Continue azithromycin and Rocephin.  Patient is MRSA negative.  Continue Solu-Medrol taper down as much as he can tolerate.  Continue MDI.  Chest x-ray 23 2020 shows hyperexpanded lungs with patchy airspace opacity at the bases likely due to pneumonia, less coarse interstitial thickening and patchy airspace opacity in the upper and mid lungs.  Reticular interstitial disease, possible fibrosis. Strep pneumo negative.  PPE gown and gloves face shield and surgical mask was used during this encounter.  COVID-19 pending.  Patient needs inpatient hospital stay for IV antibiotics.   #2 renal stones with pyuria patient asymptomatic.  #3  GERD continue Pepcid  #4 hypertension continue Norvasc and Coreg  #5  stage III CKD improved creatinine 0.90 today.  DVT prophylaxis: Lovenox  Code Status: Full, confirmed at admission Family Communication: None at bedside, did not reach family this morning by phone. Disposition Plan: Uncertain Consults called: None   Nutrition Problem: Inadequate oral intake Etiology: inability to eat     Signs/Symptoms: NPO status    Interventions: Ensure Enlive (each supplement provides 350kcal and 20 grams of protein), MVI  Estimated body mass index is 23.93 kg/m as calculated from the following:   Height as of this encounter: 5\' 7"  (1.702 m).   Weight as of this encounter: 69.3 kg.   Subjective: Patient resting in bed feels his breathing is somewhat better than yesterday night staff denies any new concerns  Objective: Vitals:   03/27/18 0700 03/27/18 0800 03/27/18 0900 03/27/18 1000  BP:  118/74 116/63 (!) 96/56  Pulse: 84 85  81 80  Resp: 19 18 15 12   Temp:  (!) 97.4 F (36.3 C)    TempSrc:  Oral    SpO2: 97% 95% 97% 98%  Weight:      Height:        Intake/Output Summary (Last 24 hours) at 03/27/2018 1032 Last data filed at 03/27/2018 1000 Gross per 24  hour  Intake 3868.52 ml  Output 1825 ml  Net 2043.52 ml   Filed Weights   03/26/18 0722 03/26/18 1200  Weight: 70.3 kg 69.3 kg    Examination:  General exam: Appears calm and comfortable  Respiratory system: Scattered rhonchi to auscultation. Respiratory effort normal. Cardiovascular system: S1 & S2 heard, RRR. No JVD, murmurs, rubs, gallops or clicks. No pedal edema. Gastrointestinal system: Abdomen is nondistended, soft and nontender. No organomegaly or masses felt. Normal bowel sounds heard. Central nervous system: Alert and oriented. No focal neurological deficits. Extremities: Symmetric 5 x 5 power. Skin: No rashes, lesions or ulcers Psychiatry: Judgement and insight appear normal. Mood & affect appropriate.     Data Reviewed: I have personally reviewed following labs and imaging studies  CBC: Recent Labs  Lab 03/25/18 1037 03/26/18 0718 03/27/18 0308  WBC 11.3* 16.8* 16.0*  NEUTROABS 8.3* 14.0* 14.5*  HGB 10.3* 11.0* 9.3*  HCT 31.6* 38.0* 31.8*  MCV 87.1 94.3 93.8  PLT 546.0* 577* 607*   Basic Metabolic Panel: Recent Labs  Lab 03/26/18 0718 03/27/18 0308  NA 138 138  K 4.3 3.6  CL 98 104  CO2 29 26  GLUCOSE 143* 140*  BUN 18 18  CREATININE 1.31* 0.90  CALCIUM 8.9 8.0*   GFR: Estimated Creatinine Clearance: 64.3 mL/min (by C-G formula based on SCr of 0.9 mg/dL). Liver Function Tests: No results for input(s): AST, ALT, ALKPHOS, BILITOT, PROT, ALBUMIN in the last 168 hours. No results for input(s): LIPASE, AMYLASE in the last 168 hours. No results for input(s): AMMONIA in the last 168 hours. Coagulation Profile: No results for input(s): INR, PROTIME in the last 168 hours. Cardiac Enzymes: No results for input(s): CKTOTAL, CKMB, CKMBINDEX, TROPONINI in the last 168 hours. BNP (last 3 results) Recent Labs    02/22/18 1055  PROBNP 137.0*   HbA1C: No results for input(s): HGBA1C in the last 72 hours. CBG: No results for input(s): GLUCAP in the  last 168 hours. Lipid Profile: No results for input(s): CHOL, HDL, LDLCALC, TRIG, CHOLHDL, LDLDIRECT in the last 72 hours. Thyroid Function Tests: No results for input(s): TSH, T4TOTAL, FREET4, T3FREE, THYROIDAB in the last 72 hours. Anemia Panel: No results for input(s): VITAMINB12, FOLATE, FERRITIN, TIBC, IRON, RETICCTPCT in the last 72 hours. Sepsis Labs: Recent Labs  Lab 03/26/18 0718 03/26/18 0809  PROCALCITON <0.10  --   LATICACIDVEN  --  1.0    Recent Results (from the past 240 hour(s))  MRSA PCR Screening     Status: None   Collection Time: 03/26/18 12:54 PM  Result Value Ref Range Status   MRSA by PCR NEGATIVE NEGATIVE Final    Comment:        The GeneXpert MRSA Assay (FDA approved for NASAL specimens only), is one component of a comprehensive MRSA colonization surveillance program. It is not intended to diagnose MRSA infection nor to guide or monitor treatment for MRSA infections. Performed at [facility], [place] [address] [place], [place] [address]   Culture, sputum-assessment     Status: None (Preliminary result)   Collection Time: 03/26/18  6:39 PM  Result Value Ref Range Status   Specimen Description SPU EXPECTORATED  Final   Special Requests NONE  Final   Sputum evaluation   Final    THIS SPECIMEN IS ACCEPTABLE FOR SPUTUM CULTURE Performed at [facility], [place] [address], [place], [place] [address]    Report Status PENDING  Incomplete  Culture, respiratory     Status: None (Preliminary result)   Collection Time: 03/26/18  6:39 PM  Result Value Ref Range Status   Specimen Description   Final    SPU EXPECTORATED Performed at [place] [address], [place], [place] [address]    Special Requests   Final    NONE Reflexed from [id number] Performed at [place] [address] [place], [place] [address]    Gram Stain   Final    ABUNDANT WBC PRESENT, PREDOMINANTLY PMN RARE  GRAM VARIABLE ROD RARE YEAST    Culture   Final    TOO YOUNG TO READ Performed at [facility], [place] [address], [place],  [address]    Report Status PENDING  Incomplete         Radiology Studies: Dg Chest 2 View  Result Date: 03/25/2018 CLINICAL DATA:  Follow-up pulmonary infiltrates EXAM: CHEST - 2 VIEW COMPARISON:  03/08/2018 FINDINGS: Cardiac shadow is stable. Patchy airspace opacity is again identified relatively increased from the prior exam. Additionally some more focal infiltrates are seen in the left base projecting on the lateral film in the lingula and left lower lobe. No sizable effusion is seen. No bony abnormality is noted. IMPRESSION: Persistent and slightly increased peripheral opacities with some more acute infiltrative density seen in the bases. CT may be helpful for further evaluation. Electronically Signed   By: Inez Catalina M.D.   On: 03/25/2018 11:00   Dg Chest Port 1 View  Result Date: 03/26/2018 CLINICAL DATA:  Respiratory distress EXAM: PORTABLE CHEST 1 VIEW COMPARISON:  March 25, 2018 and October 31, 2017 chest radiographs FINDINGS: Lungs are hyperexpanded with scattered areas of scarring. There is persistent patchy airspace opacity in the lung bases. Elsewhere, there is somewhat less interstitial thickening and patchy consolidation, although there is underlying reticular interstitial disease fairly diffusely throughout the lungs. The heart size and pulmonary vascularity are normal. No adenopathy. There is aortic atherosclerosis as well as foci of coronary artery calcification. Bones are osteoporotic. IMPRESSION: Lungs hyperexpanded. Patchy airspace opacity in the lung bases is likely due to pneumonia. Less coarse interstitial thickening and patchy airspace opacity in the upper and mid lungs compared to 1 day prior. There remains underlying reticular interstitial disease, probably enlarged part due to fibrosis. No new opacity evident. Heart size within  normal limits. There is not felt to be cardiogenic edema present. No adenopathy evident. Aortic Atherosclerosis (ICD10-I70.0). There also foci of coronary artery calcification. Electronically Signed   By: Lowella Grip III M.D.   On: 03/26/2018 08:59        Scheduled Meds:  amLODipine  5 mg Oral Daily   aspirin EC  81 mg Oral Daily   carvedilol  12.5 mg Oral BID WC   Chlorhexidine Gluconate Cloth  6 each Topical Daily   enoxaparin (LOVENOX) injection  40 mg Subcutaneous Q24H   famotidine  20 mg Oral QHS   feeding supplement (ENSURE ENLIVE)  237 mL Oral BID BM   gabapentin  300 mg Oral TID   guaiFENesin  1,200 mg Oral BID   Ipratropium-Albuterol  1  puff Inhalation Q6H   mouth rinse  15 mL Mouth Rinse BID   methylPREDNISolone (SOLU-MEDROL) injection  40 mg Intravenous Q12H   multivitamin with minerals  1 tablet Oral Daily   pantoprazole  40 mg Oral QAC breakfast   rosuvastatin  20 mg Oral Daily   sodium chloride flush  3 mL Intravenous Q12H   terazosin  10 mg Oral QHS   Continuous Infusions:  sodium chloride Stopped (03/27/18 0915)   azithromycin Stopped (03/27/18 1015)   cefTRIAXone (ROCEPHIN)  IV Stopped (03/27/18 0846)     LOS: 1 day     Georgette Shell, MD [facility]  If 7PM-7AM, please contact night-coverage [email or web address] Password [facility] 03/27/2018, 10:32 AM

## 2018-03-27 NOTE — Progress Notes (Signed)
PT Cancellation Note  Patient Details Name: JERIMIAH WOLMAN MRN: [id number] DOB: 1940/11/30   Cancelled Treatment:    Reason Eval/Treat Not Completed: Other (comment) - Per health system leadership, therapy services are being held at this time until patient tests negative for COVID-19.    Julien Girt, PT Acute Rehabilitation Services Pager [phone]  Office [phone]    Roxine Caddy D Elonda Husky 03/27/2018, 9:16 AM

## 2018-03-27 NOTE — Progress Notes (Signed)
OT Cancellation Note  Patient Details Name: Justin Walters MRN: [id number] DOB: 1940-11-26   Cancelled Treatment:      Per health system leadership, therapy services are being held at this time until patient tests negative for COVID-19.    Lin Landsman Mickel Baas, OT Acute Rehabilitation Services [phone] [phone]    03/27/2018, 6:40 PM

## 2018-03-28 ENCOUNTER — Ambulatory Visit: Payer: Medicare HMO | Admitting: Internal Medicine

## 2018-03-28 MED ORDER — PREDNISONE 10 MG PO TABS: ORAL_TABLET | ORAL | 0 refills | Status: DC

## 2018-03-28 MED ORDER — ALBUTEROL SULFATE HFA 108 (90 BASE) MCG/ACT IN AERS
2.0000 | INHALATION_SPRAY | Freq: Four times a day (QID) | RESPIRATORY_TRACT | Status: DC | PRN
  Filled 2018-03-28: qty 6.7

## 2018-03-28 MED ORDER — AZITHROMYCIN 250 MG PO TABS: 500.0000 mg | ORAL_TABLET | Freq: Every day | ORAL | Status: DC

## 2018-03-28 MED ORDER — IPRATROPIUM-ALBUTEROL 20-100 MCG/ACT IN AERS
1.0000 | INHALATION_SPRAY | Freq: Four times a day (QID) | RESPIRATORY_TRACT | Status: DC
  Filled 2018-03-28 (×2): qty 4

## 2018-03-28 MED ORDER — AMOXICILLIN-POT CLAVULANATE 875-125 MG PO TABS: 1.0000 | ORAL_TABLET | Freq: Two times a day (BID) | ORAL | 0 refills | Status: AC

## 2018-03-28 NOTE — TOC Initial Note (Signed)
Transition of Care [facility]) - Initial/Assessment Note    Patient Details  Name: Justin Walters MRN: [id number] Date of Birth: [birth year]  [facility] of [facility]) CM/SW Contact:    Purcell Mouton, RN Phone Number: 03/28/2018, 10:21 AM  Clinical Narrative:                 Pt discharging home with daughter Justin Walters. Justin Walters [phone] states that she can keep pt in isolation.   Expected Discharge Plan: Home/Self Care Barriers to Discharge: No Barriers Identified   Patient Goals and CMS Choice    To get better.    Expected Discharge Plan and Services Expected Discharge Plan: Home/Self Care   Discharge Planning Services: CM Consult     Expected Discharge Date: 03/28/18                   HH Arranged: (Declined)    Prior Living Arrangements/Services   Lives with:: Adult Children Patient language and need for interpreter reviewed:: No Do you feel safe going back to the place where you live?: Yes               Activities of Daily Living Home Assistive Devices/Equipment: None ADL Screening (condition at time of admission) Patient's cognitive ability adequate to safely complete daily activities?: Yes Is the patient deaf or have difficulty hearing?: No Does the patient have difficulty seeing, even when wearing glasses/contacts?: No Does the patient have difficulty concentrating, remembering, or making decisions?: No Patient able to express need for assistance with ADLs?: Yes Does the patient have difficulty dressing or bathing?: No Independently performs ADLs?: No Communication: Independent Does the patient have difficulty walking or climbing stairs?: No Weakness of Legs: None Weakness of Arms/Hands: None  Permission Sought/Granted                  Emotional Assessment Appearance:: Appears stated age   Affect (typically observed): Accepting Orientation: : Oriented to Self, Oriented to Place, Oriented to  Time, Oriented to Situation      Admission  diagnosis:  COPD exacerbation ([place]) [J44.1] Acute on chronic respiratory failure with hypoxia and hypercapnia (HCC) [E26.83, J96.22] Patient Active Problem List   Diagnosis Date Noted  . Acute on chronic respiratory failure with hypoxia and hypercapnia ([place]) 03/26/2018  . Pulmonary infiltrates on CXR 02/24/2018  . DOE (dyspnea on exertion) 02/22/2018  . Chronic respiratory failure with hypoxia ([place]) 02/22/2018  . Upper airway cough syndrome 11/22/2017  . Occipital neuralgia 08/14/2017  . Right arm pain 08/14/2017  . GERD (gastroesophageal reflux disease) 05/24/2017  . Thrush 01/18/2017  . Bilateral hearing loss 07/19/2016  . Dizziness 01/04/2016  . Preop exam for internal medicine 01/04/2016  . Cough 04/07/2015  . COPD exacerbation ([place]) 04/07/2015  . Gastroenteritis 01/16/2015  . Skin soreness 05/15/2014  . Pelvic pain in male 03/28/2012  . Headache 03/28/2012  . Chest pain, unspecified 03/28/2012  . Impaired glucose tolerance 01/14/2011  . Preventative health care 01/14/2011  . Encounter for long-term (current) use of other medications 01/09/2011  . BPH (benign prostatic hyperplasia) 01/08/2009  . ERECTILE DYSFUNCTION 04/07/2008  . CKD (chronic kidney disease), stage III ([place]) 10/03/2007  . SKIN LESION 10/03/2007  . PSA, INCREASED 10/03/2007  . Dysuria 07/26/2007  . DIVERTICULOSIS, COLON 02/15/2007  . Hyperlipidemia 09/23/2006  . Essential hypertension 09/23/2006  . RENAL CALCULUS, HX OF 09/23/2006  . COPD GOLD III 08/01/2006   PCP:  Biagio Borg, MD Pharmacy:   [place] [address] Phone: [phone] Fax: [facility] [place], [place] [address] Phone: [phone] Fax: [phone]  [id number] - [place], [place] [address] Phone: [phone] Fax: [phone]     Social Determinants of Health (SDOH) Interventions    Readmission Risk Interventions No flowsheet data found.

## 2018-03-28 NOTE — Progress Notes (Addendum)
Received to 1443 via W/C. Tolerated well.  A/OX4.  Expressive of his needs. Call button in reach. Check in process completed.

## 2018-03-28 NOTE — Discharge Summary (Signed)
Physician Discharge Summary  Justin Walters IDP:[id number] DOB: 1940/04/21 DOA: 03/26/2018  PCP: Biagio Borg, MD  Admit date: 03/26/2018 Discharge date: 03/28/2018  Admitted From: Home Disposition: Home  Recommendations for Outpatient Follow-up:  1. Follow up with PCP in 1-2 weeks 2. Please obtain BMP/CBC in one week 3. Please follow up on the COVID-19 results  Home Health: None  equipment/Devices: None  Discharge Condition stable CODE STATUS full code Diet recommendation: Cardiac  Brief/Interim Summary:78 y.o.malewith a history of3L O2-dependent COPD , HTN, HLD, nephrolithiasis s/p ESWL 2019, BPH with LUTS who presented with acute dyspnea and wheezing. He reports 1-2 weeks of increased, intermittent, moderate wheezing improved with home nebulizer treatments, as well as slight increase in his chronic cough in that it is more productive, reported blue-green.Was evaluated yesterday by pulmonology, CXR showed infiltrate, started on augmentin and prednisone which he hasn't taken yet.He lives with his daughter who works in [employer] and has continued working, but isn't ill.  ED Course:Initially in significant respiratory distress requiring BiPAP, given nebs, solumedrol. Temp 100.58F, tachypneic, tachycardic. WBC 44.3X with neutrophilic predominance, lymphs 1.1k, creatinine 1.31, platelets 577, hgb 11. Flu negative, covid assay sent.Respiratory effort has improved, but has not returned to baseline, so admission was requested.  Review of Systems:He denies fever, chills, has some chest tightness when coughing only. No leg swelling, orthopnea, PND. He denies abdominal pain, dysuria, or change in urinary symptoms but does have LUTS from BPH which are stable. Otherwiseper HPI. All others reviewed and are negative.     Discharge Diagnoses:  Principal Problem:   Acute on chronic respiratory failure with hypoxia and hypercapnia (HCC) Active Problems:   Hyperlipidemia   Essential  hypertension   CKD (chronic kidney disease), stage III (HCC)   BPH (benign prostatic hyperplasia)   RENAL CALCULUS, HX OF   COPD exacerbation (HCC)   GERD (gastroesophageal reflux disease)   #1 acute on chronic hypoxic hypercapnic respiratory failure secondary to COPD exacerbation and possible pneumonia viral versus bacterial.  Patient is on 3 L of oxygen prior to admission to the hospital.  He lives at home with his daughter.  His daughter works in [employer] and is continuing to work.  Family is able to quarantine him at home for 14 days or till COVID-19 results come back.  I will discharge him on Augmentin for 4 more days along with prednisone taper.  He was MRSA PCR negative.  Continue MDI as he was doing at home..  Chest x-ray 23 2020 shows hyperexpanded lungs with patchy airspace opacity at the bases likely due to pneumonia, less coarse interstitial thickening and patchy airspace opacity in the upper and mid lungs.  Reticular interstitial disease, possible fibrosis. Strep pneumo negative.  PPE gown and gloves face shield and surgical mask was used during this encounter.  COVID-19 pending.    #2 renal stones with pyuria patient asymptomatic.  #3  GERD continue Pepcid  #4 hypertension risk and Coreg were stopped due to soft blood pressure this may be restarted as an outpatient anytime as appropriate.    Nutrition Problem: Inadequate oral intake Etiology: inability to eat    Signs/Symptoms: NPO status     Interventions: Ensure Enlive (each supplement provides 350kcal and 20 grams of protein), MVI  Estimated body mass index is 25.1 kg/m as calculated from the following:   Height as of this encounter: 5\' 7"  (1.702 m).   Weight as of this encounter: 72.7 kg.  Discharge Instructions   Allergies as  of 03/28/2018   No Known Allergies     Medication List    STOP taking these medications   amLODipine 5 MG tablet Commonly known as:  NORVASC   carvedilol 12.5 MG  tablet Commonly known as:  COREG   famotidine 20 MG tablet Commonly known as:  PEPCID   omeprazole 20 MG capsule Commonly known as:  PRILOSEC   umeclidinium-vilanterol 62.5-25 MCG/INH Aepb Commonly known as:  Anoro Ellipta     TAKE these medications   acetaminophen 325 MG tablet Commonly known as:  TYLENOL Take 650 mg by mouth every 6 (six) hours as needed for mild pain or headache.   albuterol 108 (90 Base) MCG/ACT inhaler Commonly known as:  PROVENTIL HFA;VENTOLIN HFA INHALE 2 PUFFS INTO THE LUNGS EVERY 6 (SIX) HOURS AS NEEDED FOR WHEEZING OR SHORTNESS OF BREATH.   albuterol (2.5 MG/3ML) 0.083% nebulizer solution Commonly known as:  PROVENTIL Take 3 mLs (2.5 mg total) by nebulization every 4 (four) hours as needed for wheezing or shortness of breath.   amoxicillin-clavulanate 875-125 MG tablet Commonly known as:  Augmentin Take 1 tablet by mouth 2 (two) times daily for 4 days.   aspirin 81 MG EC tablet Take 1 tablet (81 mg total) by mouth daily. Swallow whole.   Flutter Devi Use as directed   gabapentin 300 MG capsule Commonly known as:  NEURONTIN TAKE 1 CAPSULE THREE TIMES DAILY What changed:    how much to take  how to take this  when to take this   ipratropium-albuterol 0.5-2.5 (3) MG/3ML Soln Commonly known as:  DUONEB Inhale 3 mLs into the lungs 4 (four) times daily. 1 vial in nebulizer 4 x daily   Mucinex Maximum Strength 1200 MG Tb12 Generic drug:  Guaifenesin Take 1 tablet by mouth 2 (two) times daily.   OVER THE COUNTER MEDICATION Take 2 capsules by mouth at bedtime. Vicks "Zzzs" capsule for sleep.   OXYGEN 2 lpm with sleep and exertion if needed [facility]   pantoprazole 40 MG tablet Commonly known as:  Protonix Take 1 tablet (40 mg total) by mouth daily. Take 30-60 min before first meal of the day   predniSONE 10 MG tablet Commonly known as:  DELTASONE Take 4 tablets for 3 days then 3 tablets for next 3 days then 2 tablets for  following 3 days then take one tablet till done What changed:  additional instructions   rosuvastatin 20 MG tablet Commonly known as:  CRESTOR TAKE 1 TABLET EVERY DAY   terazosin 10 MG capsule Commonly known as:  HYTRIN TAKE 1 CAPSULE AT BEDTIME      Follow-up Information    Biagio Borg, MD Follow up.   Specialties:  Internal Medicine, Radiology Contact information: [place] [address] [phone]          No Known Allergies  Consultations:  None   Procedures/Studies: Dg Chest 2 View  Result Date: 03/25/2018 CLINICAL DATA:  Follow-up pulmonary infiltrates EXAM: CHEST - 2 VIEW COMPARISON:  03/08/2018 FINDINGS: Cardiac shadow is stable. Patchy airspace opacity is again identified relatively increased from the prior exam. Additionally some more focal infiltrates are seen in the left base projecting on the lateral film in the lingula and left lower lobe. No sizable effusion is seen. No bony abnormality is noted. IMPRESSION: Persistent and slightly increased peripheral opacities with some more acute infiltrative density seen in the bases. CT may be helpful for further evaluation. Electronically Signed  By: Inez Catalina M.D.   On: 03/25/2018 11:00   Dg Chest 2 View  Result Date: 03/08/2018 CLINICAL DATA:  Pneumonia follow-up. History of COPD. EXAM: CHEST - 2 VIEW COMPARISON:  02/22/2018 FINDINGS: Cardiomediastinal silhouette is normal. Mediastinal contours appear intact. Calcific atherosclerotic disease of the aorta. There is no evidence of pleural effusion or pneumothorax. Persistent diffuse bilateral patchy airspace opacities with peripheral predominance. Coarsening and distortion of the interstitium. Osseous structures are without acute abnormality. Soft tissues are grossly normal. IMPRESSION: Persistent diffuse bilateral patchy airspace opacities with peripheral predominance. Given the persistence of these findings since 02/22/2018, further evaluation  with chest CT may be considered. Underlying chronic lung disease. Electronically Signed   By: Fidela Salisbury M.D.   On: 03/08/2018 15:08   Dg Chest Port 1 View  Result Date: 03/26/2018 CLINICAL DATA:  Respiratory distress EXAM: PORTABLE CHEST 1 VIEW COMPARISON:  March 25, 2018 and October 31, 2017 chest radiographs FINDINGS: Lungs are hyperexpanded with scattered areas of scarring. There is persistent patchy airspace opacity in the lung bases. Elsewhere, there is somewhat less interstitial thickening and patchy consolidation, although there is underlying reticular interstitial disease fairly diffusely throughout the lungs. The heart size and pulmonary vascularity are normal. No adenopathy. There is aortic atherosclerosis as well as foci of coronary artery calcification. Bones are osteoporotic. IMPRESSION: Lungs hyperexpanded. Patchy airspace opacity in the lung bases is likely due to pneumonia. Less coarse interstitial thickening and patchy airspace opacity in the upper and mid lungs compared to 1 day prior. There remains underlying reticular interstitial disease, probably enlarged part due to fibrosis. No new opacity evident. Heart size within normal limits. There is not felt to be cardiogenic edema present. No adenopathy evident. Aortic Atherosclerosis (ICD10-I70.0). There also foci of coronary artery calcification. Electronically Signed   By: Lowella Grip III M.D.   On: 03/26/2018 08:59    (Echo, Carotid, EGD, Colonoscopy, ERCP)    Subjective:   Discharge Exam: Vitals:   03/27/18 2324 03/28/18 0452  BP: 120/68 120/73  Pulse: 81 78  Resp: 17 19  Temp: 97.8 F (36.6 C) 97.7 F (36.5 C)  SpO2: 95% 93%   Vitals:   03/27/18 2100 03/27/18 2200 03/27/18 2324 03/28/18 0452  BP: 118/60 119/62 120/68 120/73  Pulse: 81 78 81 78  Resp: (!) 25 16 17 19   Temp:   97.8 F (36.6 C) 97.7 F (36.5 C)  TempSrc:   Oral Oral  SpO2: 96% 96% 95% 93%  Weight:   72.7 kg   Height:   5\' 7"  (1.702  m)     General: Pt is alert, awake, not in acute distress Cardiovascular: RRR, S1/S2 +, no rubs, no gallops Respiratory: Scattered rhonchi bilaterally, no wheezing, no rhonchi Abdominal: Soft, NT, ND, bowel sounds + Extremities: no edema, no cyanosis    The results of significant diagnostics from this hospitalization (including imaging, microbiology, ancillary and laboratory) are listed below for reference.     Microbiology: Recent Results (from the past 240 hour(s))  Blood Culture (routine x 2)     Status: None (Preliminary result)   Collection Time: 03/26/18  8:09 AM  Result Value Ref Range Status   Specimen Description   Final    BLOOD RIGHT ANTECUBITAL Performed at [facility] [address] [place], [place] [address]    Special Requests   Final    BOTTLES DRAWN AEROBIC AND ANAEROBIC Blood Culture adequate volume Performed at [facility]  [address] [place], [place] [address]    Culture   Final    NO GROWTH 1 DAY Performed at [facility] [address] [place], [place] [address]    Report Status PENDING  Incomplete  MRSA PCR Screening     Status: None   Collection Time: 03/26/18 12:54 PM  Result Value Ref Range Status   MRSA by PCR NEGATIVE NEGATIVE Final    Comment:        The GeneXpert MRSA Assay (FDA approved for NASAL specimens only), is one component of a comprehensive MRSA colonization surveillance program. It is not intended to diagnose MRSA infection nor to guide or monitor treatment for MRSA infections. Performed at [facility], [place] [address], [place], [place] [address]   Culture, sputum-assessment     Status: None (Preliminary result)   Collection Time: 03/26/18  6:39 PM  Result Value Ref Range Status   Specimen Description SPU EXPECTORATED  Final   Special Requests NONE  Final   Sputum evaluation   Final    THIS SPECIMEN IS ACCEPTABLE FOR SPUTUM CULTURE Performed at  [facility], [place] [address], [place], [place] [address]    Report Status PENDING  Incomplete  Culture, respiratory     Status: None (Preliminary result)   Collection Time: 03/26/18  6:39 PM  Result Value Ref Range Status   Specimen Description   Final    SPU EXPECTORATED Performed at [facility] [address], [place], [place] [address]    Special Requests   Final    NONE Reflexed from [phone] [id number] Performed at [facility] [address] [place], [place] [address]    Gram Stain   Final    ABUNDANT WBC PRESENT, PREDOMINANTLY PMN RARE GRAM VARIABLE ROD RARE YEAST    Culture   Final    CULTURE REINCUBATED FOR BETTER GROWTH Performed at [facility], [place] [address] [place], [place] [address]    Report Status PENDING  Incomplete     Labs: BNP (last 3 results) No results for input(s): BNP in the last 8760 hours. Basic Metabolic Panel: Recent Labs  Lab 03/26/18 0718 03/27/18 0308  NA 138 138  K 4.3 3.6  CL 98 104  CO2 29 26  GLUCOSE 143* 140*  BUN 18 18  CREATININE 1.31* 0.90  CALCIUM 8.9 8.0*   Liver Function Tests: No results for input(s): AST, ALT, ALKPHOS, BILITOT, PROT, ALBUMIN in the last 168 hours. No results for input(s): LIPASE, AMYLASE in the last 168 hours. No results for input(s): AMMONIA in the last 168 hours. CBC: Recent Labs  Lab 03/25/18 1037 03/26/18 0718 03/27/18 0308  WBC 11.3* 16.8* 16.0*  NEUTROABS 8.3* 14.0* 14.5*  HGB 10.3* 11.0* 9.3*  HCT 31.6* 38.0* 31.8*  MCV 87.1 94.3 93.8  PLT 546.0* 577* 465*   Cardiac Enzymes: No results for input(s): CKTOTAL, CKMB, CKMBINDEX, TROPONINI in the last 168 hours. BNP: Invalid input(s): POCBNP CBG: No results for input(s): GLUCAP in the last 168 hours. D-Dimer No results for input(s): DDIMER in the last 72 hours. Hgb A1c No results for input(s): HGBA1C in the last 72 hours. Lipid Profile No results for input(s): CHOL, HDL, LDLCALC,  TRIG, CHOLHDL, LDLDIRECT in the last 72 hours. Thyroid function studies No results for input(s): TSH, T4TOTAL, T3FREE, THYROIDAB in the last 72 hours.  Invalid input(s): FREET3 Anemia work up No results for input(s): VITAMINB12, FOLATE, FERRITIN, TIBC, IRON, RETICCTPCT in the last  72 hours. Urinalysis    Component Value Date/Time   COLORURINE YELLOW 03/26/2018 1056   APPEARANCEUR HAZY (A) 03/26/2018 1056   LABSPEC 1.016 03/26/2018 1056   PHURINE 5.0 03/26/2018 1056   GLUCOSEU NEGATIVE 03/26/2018 1056   GLUCOSEU NEGATIVE 07/06/2017 0958   HGBUR SMALL (A) 03/26/2018 1056   HGBUR moderate 07/26/2007 0924   BILIRUBINUR NEGATIVE 03/26/2018 1056   KETONESUR 5 (A) 03/26/2018 1056   PROTEINUR NEGATIVE 03/26/2018 1056   UROBILINOGEN 0.2 07/06/2017 0958   NITRITE POSITIVE (A) 03/26/2018 1056   LEUKOCYTESUR SMALL (A) 03/26/2018 1056   Sepsis Labs Invalid input(s): PROCALCITONIN,  WBC,  LACTICIDVEN Microbiology Recent Results (from the past 240 hour(s))  Blood Culture (routine x 2)     Status: None (Preliminary result)   Collection Time: 03/26/18  8:09 AM  Result Value Ref Range Status   Specimen Description   Final    BLOOD RIGHT ANTECUBITAL Performed at [facility] [address], [place], [place] [address]    Special Requests   Final    BOTTLES DRAWN AEROBIC AND ANAEROBIC Blood Culture adequate volume Performed at [facility] [address], [place], [place] [address]    Culture   Final    NO GROWTH 1 DAY Performed at [facility], [place] [address], [place], [place] [address]    Report Status PENDING  Incomplete  MRSA PCR Screening     Status: None   Collection Time: 03/26/18 12:54 PM  Result Value Ref Range Status   MRSA by PCR NEGATIVE NEGATIVE Final    Comment:        The GeneXpert MRSA Assay (FDA approved for NASAL specimens only), is one component of a comprehensive MRSA colonization surveillance program. It is  not intended to diagnose MRSA infection nor to guide or monitor treatment for MRSA infections. Performed at [facility], [place] [address], [place], [place] [address]   Culture, sputum-assessment     Status: None (Preliminary result)   Collection Time: 03/26/18  6:39 PM  Result Value Ref Range Status   Specimen Description SPU EXPECTORATED  Final   Special Requests NONE  Final   Sputum evaluation   Final    THIS SPECIMEN IS ACCEPTABLE FOR SPUTUM CULTURE Performed at [facility], [place] [address] [place], [place] [address]    Report Status PENDING  Incomplete  Culture, respiratory     Status: None (Preliminary result)   Collection Time: 03/26/18  6:39 PM  Result Value Ref Range Status   Specimen Description   Final    SPU EXPECTORATED Performed at [facility] [address], [place], [place] [address]    Special Requests   Final    NONE Reflexed from [id number] Performed at [facility] [address], [place], [place] [address]    Gram Stain   Final    ABUNDANT WBC PRESENT, PREDOMINANTLY PMN RARE GRAM VARIABLE ROD RARE YEAST    Culture   Final    CULTURE REINCUBATED FOR BETTER GROWTH Performed at [facility], [place] [address] [place], [place] [address]    Report Status PENDING  Incomplete     Time coordinating discharge: 33  minutes  SIGNED:   Georgette Shell, MD  [facility] 03/28/2018, 10:34 AM Pager   If 7PM-7AM, please contact night-coverage [email or web address] Password [email or web address]

## 2018-03-28 NOTE — Discharge Instructions (Signed)
Quarantine till covid test is back

## 2018-03-28 NOTE — Progress Notes (Signed)
PHARMACIST - PHYSICIAN COMMUNICATION  CONCERNING: Antibiotic IV to Oral Route Change Policy  RECOMMENDATION: This patient is receiving azithromycin by the intravenous route.  Based on criteria approved by the Pharmacy and Therapeutics Committee, the antibiotic(s) is/are being converted to the equivalent oral dose form(s).   DESCRIPTION: These criteria include:  Patient being treated for a respiratory tract infection, urinary tract infection, cellulitis or clostridium difficile associated diarrhea if on metronidazole  The patient is not neutropenic and does not exhibit a GI malabsorption state  The patient is eating (either orally or via tube) and/or has been taking other orally administered medications for a least 24 hours  The patient is improving clinically and has a Tmax < 100.5  If you have questions about this conversion, please contact the Pharmacy Department  []  ( [phone] )  [place] []  ( [phone] )  [facility] []  ( [phone]  [facility] []  ( [phone] )  [facility] [x]  ( [phone] )  [facility]  

## 2018-03-29 ENCOUNTER — Ambulatory Visit (INDEPENDENT_AMBULATORY_CARE_PROVIDER_SITE_OTHER): Payer: Medicare HMO | Admitting: Internal Medicine

## 2018-03-29 ENCOUNTER — Telehealth: Payer: Self-pay | Admitting: *Deleted

## 2018-03-29 ENCOUNTER — Encounter: Payer: Self-pay | Admitting: Internal Medicine

## 2018-03-29 DIAGNOSIS — J9621 Acute and chronic respiratory failure with hypoxia: Secondary | ICD-10-CM

## 2018-03-29 DIAGNOSIS — R7302 Impaired glucose tolerance (oral): Secondary | ICD-10-CM

## 2018-03-29 DIAGNOSIS — J441 Chronic obstructive pulmonary disease with (acute) exacerbation: Secondary | ICD-10-CM | POA: Diagnosis not present

## 2018-03-29 DIAGNOSIS — J9622 Acute and chronic respiratory failure with hypercapnia: Secondary | ICD-10-CM

## 2018-03-29 DIAGNOSIS — R918 Other nonspecific abnormal finding of lung field: Secondary | ICD-10-CM | POA: Diagnosis not present

## 2018-03-29 LAB — CULTURE, RESPIRATORY W GRAM STAIN

## 2018-03-29 LAB — LEGIONELLA PNEUMOPHILA SEROGP 1 UR AG: L. pneumophila Serogp 1 Ur Ag: NEGATIVE

## 2018-03-29 LAB — CULTURE, RESPIRATORY: CULTURE: NORMAL

## 2018-03-29 NOTE — Telephone Encounter (Signed)
Amlodipine was d/c'ed on hospital d/c

## 2018-03-29 NOTE — Progress Notes (Signed)
Virtual Visit via Video Note  I connected with Justin Walters on 03/29/18 at  3:20 PM EDT by a video enabled telemedicine application and verified that I am speaking with the correct person using two identifiers.  Pt is at home, and I am in the office.  No other persons present   I discussed the limitations of evaluation and management by telemedicine and the availability of in person appointments. The patient expressed understanding and agreed to proceed.  History of Present Illness: 78 y.o.malewith a history of3L O2-dependent COPD , HTN, HLD, nephrolithiasis s/p ESWL 2019, BPH with LUTS who presented with acute dyspnea and wheezing. He reported 1-2 weeks of increased, intermittent, moderate wheezing improved with home nebulizer treatments, as well as slight increase in his chronic cough in that it is more productive, reported blue-green.Was evaluated initially by pulmonology, CXR showed infiltrate, started on augmentin and prednisone pt became worse before could start these.  Was admitted with acute on chronic hypoxic hypercap resp failure, COPD exacerbation and probable PNA.  Tx with IV steroid and antibx, o2 and nebs and improved Chest x-ray 23 2020 shows hyperexpanded lungs with patchy airspace opacity at the bases likely due to pneumonia, less coarse interstitial thickening and patchy airspace opacity in the upper and mid lungs. Reticular interstitial disease, possible fibrosis. Strep pneumo negative.  Was d/c 3/26 with plan for on Augmentin for 4 more days along with prednisone taper. COVID-19 pending  Coreg as also stopped due to soft blood pressure this may be restarted as an outpatient anytime as appropriate.  Today - pt states has only been one day post d/c but feels he is mild improved over d/c, still with mild cough but wob/sob/doe/wheezing overall better.  States no worsening prod cough or fever, has been taking the augmentin and plans to finish.  Pt denies chest pain, orthopnea, PND,  increased LE swelling, palpitations, dizziness or syncope.  Pt denies polydipsia, polyuria Past Medical History:  Diagnosis Date  . At risk for sleep apnea    STOP-BANG= 5    SENT TO PCP 06-13-2013  . Bladder stone   . BPH (benign prostatic hyperplasia)   . COPD (chronic obstructive pulmonary disease) ([facility])   . Diverticulosis of colon   . Dysuria   . Glucose intolerance (impaired glucose tolerance)   . History of kidney stones   . Hyperlipidemia   . Hypertension   . Ureteral disorder 2018   ureteral repair   Past Surgical History:  Procedure Laterality Date  . APPENDECTOMY  1980's  . CATARACT EXTRACTION W/ INTRAOCULAR LENS  IMPLANT, BILATERAL    . COLONOSCOPY  10-05-2003   tics only   . CYSTOSCOPY N/A 06/23/2013   Procedure: CYSTOSCOPY WITH LITHOLAPAXY, BLADDER EXPLORATION, REPAIR BLADDER PERFORATION;  Surgeon: Arvil Persons, MD;  Location: [facility];  Service: Urology;  Laterality: N/A;  . CYSTOSCOPY/URETEROSCOPY/HOLMIUM LASER/STENT PLACEMENT Left 04/03/2017   Procedure: CYSTOSCOPY LEFT RETROGRADE/LEFT /URETEROSCOPY/HOLMIUM LASER/BASKET STONE EXTRACTION/STENT PLACEMENT/ URETHRAL BIOPSY;  Surgeon: [name], MD;  Location: [facility];  Service: Urology;  Laterality: Left;  . EXTRACORPOREAL SHOCK WAVE LITHOTRIPSY Left 10/23/2016   Procedure: LEFT EXTRACORPOREAL SHOCK WAVE LITHOTRIPSY (ESWL);  Surgeon: Nickie Retort, MD;  Location: [facility];  Service: Urology;  Laterality: Left;  . EXTRACORPOREAL SHOCK WAVE LITHOTRIPSY Left 01/08/2017   Procedure: LEFT EXTRACORPOREAL SHOCK WAVE LITHOTRIPSY (ESWL);  Surgeon: [name], MD;  Location: [facility];  Service: Urology;  Laterality: Left;  . HOLMIUM LASER APPLICATION N/A 02/13/9415   Procedure:  HOLMIUM LASER APPLICATION;  Surgeon: Arvil Persons, MD;  Location: [facility];  Service: Urology;  Laterality: N/A;  . LAPAROSCOPIC INGUINAL HERNIA REPAIR Bilateral 10-09-2003  . TRANSURETHRAL RESECTION OF PROSTATE  N/A 06/23/2013   Procedure: TRANSURETHRAL RESECTION OF THE PROSTATE;  Surgeon: Arvil Persons, MD;  Location[facility];  Service: Urology;  Laterality: N/A;    reports that he quit smoking about 25 years ago. His smoking use included cigarettes. He has a 102.00 pack-year smoking history. He has never used smokeless tobacco. He reports that he does not drink alcohol or use drugs. family history includes COPD in his mother; Cancer in his father; Emphysema in his brother; Heart failure in his brother. No Known Allergies Current Outpatient Medications on File Prior to Visit  Medication Sig Dispense Refill  . acetaminophen (TYLENOL) 325 MG tablet Take 650 mg by mouth every 6 (six) hours as needed for mild pain or headache.    . albuterol (PROVENTIL HFA;VENTOLIN HFA) 108 (90 Base) MCG/ACT inhaler INHALE 2 PUFFS INTO THE LUNGS EVERY 6 (SIX) HOURS AS NEEDED FOR WHEEZING OR SHORTNESS OF BREATH. 54 g 3  . albuterol (PROVENTIL) (2.5 MG/3ML) 0.083% nebulizer solution Take 3 mLs (2.5 mg total) by nebulization every 4 (four) hours as needed for wheezing or shortness of breath. 120 mL 11  . amoxicillin-clavulanate (AUGMENTIN) 875-125 MG tablet Take 1 tablet by mouth 2 (two) times daily for 4 days. 8 tablet 0  . aspirin 81 MG EC tablet Take 1 tablet (81 mg total) by mouth daily. Swallow whole. 30 tablet 12  . gabapentin (NEURONTIN) 300 MG capsule TAKE 1 CAPSULE THREE TIMES DAILY (Patient taking differently: Take 300 mg by mouth 3 (three) times daily. TAKE 1 CAPSULE THREE TIMES DAILY) 270 capsule 1  . Guaifenesin (MUCINEX MAXIMUM STRENGTH) 1200 MG TB12 Take 1 tablet by mouth 2 (two) times daily.    Marland Kitchen ipratropium-albuterol (DUONEB) 0.5-2.5 (3) MG/3ML SOLN Inhale 3 mLs into the lungs 4 (four) times daily. 1 vial in nebulizer 4 x daily    . OVER THE COUNTER MEDICATION Take 2 capsules by mouth at bedtime. Vicks "Zzzs" capsule for sleep.    . OXYGEN 2 lpm with sleep and exertion if needed [facility]     . pantoprazole (PROTONIX) 40 MG tablet Take 1 tablet (40 mg total) by mouth daily. Take 30-60 min before first meal of the day 30 tablet 2  . predniSONE (DELTASONE) 10 MG tablet Take 4 tablets for 3 days then 3 tablets for next 3 days then 2 tablets for following 3 days then take one tablet till done 30 tablet 0  . Respiratory Therapy Supplies (FLUTTER) DEVI Use as directed 1 each 0  . rosuvastatin (CRESTOR) 20 MG tablet TAKE 1 TABLET EVERY DAY (Patient taking differently: Take 20 mg by mouth daily. ) 90 tablet 2  . terazosin (HYTRIN) 10 MG capsule TAKE 1 CAPSULE AT BEDTIME (Patient taking differently: Take 10 mg by mouth at bedtime. ) 90 capsule 2   No current facility-administered medications on file prior to visit.    Observations/Objective: Alert, oriented, mentating well, NAD, not using accessory muscles, but fatigued  Assessment and Plan: Acute hypoxic hypercarb Resp Failure - now back to baseline Home o2; to continue  CAP - slowly improving, to finish the augmentin as planned  COPD exacerbation - subjectively improved today, advised to cont prednisone taper, cough med prn, and inhaler asd  Follow Up Instructions: See above   I  discussed the assessment and treatment plan with the patient. The patient was provided an opportunity to ask questions and all were answered. The patient agreed with the plan and demonstrated an understanding of the instructions.   The patient was advised to call back or seek an in-person evaluation if the symptoms worsen or if the condition fails to improve as anticipated.  I provided 18 minutes of non-face-to-face time during this encounter.   Cathlean Cower, MD

## 2018-03-29 NOTE — Telephone Encounter (Signed)
[facility] F/U  Patient admitted: 03/26/18 Discharged Home: 03/28/18   Transition Care Management Follow-up Telephone Call How have you been since you were released from the hospital? He has been feeling better today.  Do you understand why you were in the hospital? {Yes  Do you understand the discharge instrcutions?Yes Items Reviewed:  Medications reviewed: Yes  Allergies reviewed: Yes  Dietary changes reviewed: Yes Referrals reviewed: Yes  Functional Questionnaire:   Activities of Daily Living (ADLs):   He states they are independent in the following:  States they require assistance with the following: None   Any transportation issues/concerns?: No  Any patient concerns? No   Confirmed importance and date/time of follow-up visits scheduled:Yes, virtual visit today @ 3:20 pm with PCP    Confirmed with patient if condition begins to worsen call PCP or go to the ER.  Patient was given the Call-a-Nurse line [phone]: {YES

## 2018-03-29 NOTE — Assessment & Plan Note (Signed)
See above

## 2018-03-29 NOTE — Assessment & Plan Note (Signed)
stable overall by history and exam, recent data reviewed with pt, and pt to continue medical treatment as before,  to f/u any worsening symptoms or concerns  

## 2018-03-29 NOTE — Patient Instructions (Signed)
See above

## 2018-03-29 NOTE — Assessment & Plan Note (Signed)
Possible CAP, to finish augmentin, consider f/u cxr with pulm apr 4 at planned f/u there

## 2018-03-30 DIAGNOSIS — J449 Chronic obstructive pulmonary disease, unspecified: Secondary | ICD-10-CM | POA: Diagnosis not present

## 2018-03-31 LAB — CULTURE, BLOOD (ROUTINE X 2)
Culture: NO GROWTH
Special Requests: ADEQUATE

## 2018-04-05 ENCOUNTER — Telehealth: Payer: Self-pay | Admitting: Internal Medicine

## 2018-04-05 NOTE — Telephone Encounter (Signed)
Lets hold off until returns for post hosp unless worse in meantime

## 2018-04-05 NOTE — Telephone Encounter (Signed)
Last ov for patient with MW was 03/25/2018, CT Chest high resolution was ordered for 2 weeks out Pt was admitted in hospital for covid19 on 03/26/2018 Does the patient need to keep the CT high res Chest scan or change the date?  MW please advise.

## 2018-04-05 NOTE — Telephone Encounter (Signed)
Forwarding message to Rodena Piety in [facility]

## 2018-04-06 LAB — NOVEL CORONAVIRUS, NAA (HOSP ORDER, SEND-OUT TO REF LAB; TAT 18-24 HRS): SARS-CoV-2, NAA: NOT DETECTED

## 2018-04-08 ENCOUNTER — Inpatient Hospital Stay: Admission: RE | Admit: 2018-04-08 | Payer: Medicare HMO | Source: Ambulatory Visit

## 2018-04-10 ENCOUNTER — Telehealth: Payer: Self-pay | Admitting: Internal Medicine

## 2018-04-10 NOTE — Telephone Encounter (Signed)
LMTCB x 1 

## 2018-04-10 NOTE — Telephone Encounter (Signed)
PT aware 5/4 visit has been changed to televisit. Pt is ok with that. Nothing further needed.

## 2018-04-16 ENCOUNTER — Other Ambulatory Visit: Payer: Self-pay | Admitting: Internal Medicine

## 2018-04-23 DIAGNOSIS — J449 Chronic obstructive pulmonary disease, unspecified: Secondary | ICD-10-CM | POA: Diagnosis not present

## 2018-04-23 DIAGNOSIS — J9611 Chronic respiratory failure with hypoxia: Secondary | ICD-10-CM | POA: Diagnosis not present

## 2018-04-30 DIAGNOSIS — J449 Chronic obstructive pulmonary disease, unspecified: Secondary | ICD-10-CM | POA: Diagnosis not present

## 2018-05-06 ENCOUNTER — Other Ambulatory Visit: Payer: Self-pay

## 2018-05-06 ENCOUNTER — Encounter: Payer: Self-pay | Admitting: Internal Medicine

## 2018-05-06 ENCOUNTER — Ambulatory Visit (INDEPENDENT_AMBULATORY_CARE_PROVIDER_SITE_OTHER): Payer: Medicare HMO | Admitting: Internal Medicine

## 2018-05-06 DIAGNOSIS — R918 Other nonspecific abnormal finding of lung field: Secondary | ICD-10-CM

## 2018-05-06 DIAGNOSIS — I10 Essential (primary) hypertension: Secondary | ICD-10-CM | POA: Diagnosis not present

## 2018-05-06 DIAGNOSIS — J9611 Chronic respiratory failure with hypoxia: Secondary | ICD-10-CM | POA: Diagnosis not present

## 2018-05-06 DIAGNOSIS — J449 Chronic obstructive pulmonary disease, unspecified: Secondary | ICD-10-CM

## 2018-05-06 NOTE — Progress Notes (Signed)
Subjective:     Patient ID: Justin Walters, male   DOB: 05/18/1940,     MRN: [id number]    Brief patient profile:  78 yowm MM quit smoking 1995 with cough/wheeze/ sob and never recovered with documented GOLD III copd 02/23/2017      History of Present Illness  01/25/2017 1st  office visit/ Justin Walters   GOLD III with reversibility  Chief Complaint  Patient presents with  . Pulmonary Consult    Referred by the hospital. He c/o SOB and coughing for the past several wks. His cough is occ prod, but he is unsure of sputum color. He is SOB with exertion and also when he lies down.  He gets winded walking approx 100 yards. He is using his albuterol inhaler 3 x per wk on average.   baseline doe across the street to son's house  nl pace and then onset   x one year gradually worse doe and no better on inhalers but poor baseline hfa/ assoc with cough / sense of chest congestion and has to prop up at 30 degrees now at hs to get comfortable Present doe = MMRC3 = can't walk 100 yards even at a slow pace at a flat grade s stopping due to sob  Easily confused with details of care/ names of meds / not using spiriva/ was better on pred and worse since tapered rec Plan A = Automatic =  symbicort 160 Take 2 puffs first thing in am and then another 2 puffs about 12 hours later.  Work on inhaler technique:  Plan B = Backup Only use your albuterol  as a rescue  Prednisone 10 mg take  4 each am x 2 days,   2 each am x 2 days,  1 each am x 2 days and stop     02/23/2017  f/u ov/Justin Walters re:  GOLD III copd / no 02 Chief Complaint  Patient presents with  . Follow-up    PFT's today.  Breathing has improved some. He is rarely using his albuterol inhaler. He has been feeling fatigued.    better p prednisone then gradually worse  Dyspnea:  MMRC2 = can't walk a nl pace on a flat grade s sob but does fine slow and flat  Cough: none Sleep: ok 30 degrees no am congestion rec Add tudorza one puff after symbicort in am and in pm   Work on inhaler technique:      05/24/2017  f/u ov/Justin Walters re:  Copd III/ no 02  Just on maint symb 160 / could not afford tudorza and not sure it helped doe  Chief Complaint  Patient presents with  . Follow-up    Breathing is doing well. He rarely uses his albuterol inhaler. No new co's.   Dyspnea:  MMRC2 = can't walk a nl pace on a flat grade s sob but does fine slow pace pretty much anywhere he wants to go  Cough: none Sleep: ok flat SABA use:  Rare  Hb every hs x sev months / resolves p about an hour, better p sit up / no assoc dysphagia or daytime symptoms/ varies some with how early he eats supper and how much, not sure what type meals make it worse  rec GERD diet  If not better add pepcid ac 20 mg one hour before bed > did not  Do   10/31/17 NP eval for 3-4 weeks worse sob/ cough rec Xopenex in office  Depo 80 Prednisone 10mg  tablet  >>>  4 tabs for 2 days, then 3 tabs for 2 days, 2 tabs for 2 days, then 1 tab for 2 days, then sto Azithromycin 250mg  tablet  >>>Take 2 tablets (500mg  total) today, and then 1 tablet (250mg ) for the next four days      11/21/2017 acute extended ov/Justin Walters re: refractory cough x early oct 2019  / no change sob  Chief Complaint  Patient presents with  . Acute Visit    pt c/o sinus congestion, pnd, prod cough with white mucus X1 month.  has had 2 rounds of pred and doxy with little relief.   dyspnea : MMRC2 = can't walk a nl pace on a flat grade s sob but does fine slow and flat= baseline when not coughing  Cough > white mucus  not worse in am's/ harsh daytime coughing fts Sleeping flat two pillow saba  Not at all now rec Plan A = Automatic = symbicort 160 Take 2 puffs first thing in am and then another 2 puffs about 12 hours later.  Whenever your cough flares > try  Try prilosec otc 20mg   Take 30-60 min before first meal of the day and Pepcid ac (famotidine) 20 mg one after hour after supper or at bedtime until cough is completely gone for at  least a week without the need for cough suppression Plan B = Backup Only use your albuterol inhaler as a rescue medication  goes up over your usual need - Don't leave home without it !!  (think of it like the spare tire for your car)  For cough Mucinex dm 1200 mg every 12 hours and the tylenol #3  Up to every 4 hours if need for persistent irritating coughing  GERD diet      12/05/2017 acute extended ov/Justin Walters re: cough and sob worse since oc 2019 Chief Complaint  Patient presents with  . Acute Visit    Increased SOB, cough and chest congestion x 1 month. Sputum is white to yellow. He is using his albuterol inhaler 2 x per wk on average.   cough is worse p supper and fine on back with  flat bed/ no blocks  In am feels fine but groggy p benadryl - can't  sleep s it W/in 20 min of rising lots of pnds > white mucus  Not using mucinex dm / reversing ppi and h2  Technique on symb poor and not using saba correctly in terms or when to use  rec Augmentin 875 mg take one pill twice daily  X 10 days - take at breakfast and supper with large glass of water.  .  Take prednisone  4 for three days 3 for three days 2 for three days 1 for three days and stop  Omeprazole 20 mg Take 30- 60 min before your first and last meals of the day and continue famotidine 20 mg at bedtime Bed blocks would be a great idea Try off zquil and tylenol #3  Up to 2 at bedtime For cough > mucinex dm 1200 mg every 12 hours and use the flutter valve as much as possible and if still can't stop coughing then use the tylenol #3        12/20/2017  f/u ov/Justin Walters re:  Cough better, doe not quite baseline on symb 160 2bid  No worse off pred Chief Complaint  Patient presents with  . Follow-up    Breathing has improved some but is not back to his normal baseline. He rarely uses  his albuterol inhaler.   Dyspnea:  MMRC2 = can't walk a nl pace on a flat grade s sob but does fine slow and flat  Cough: better/ no mucus production at all   Sleeping: ok flat  SABA use: rarely 02: none   rec Bed blocks 6-8 inches  For cough > Mucinex dm up to 1200 mg twice daily and use the flutter valve as much as you can Gabapentin should be three times daily  Omeprazole is Take 30-60 min before first meal of the day and pepcid ac 20 mg after supper     01/25/2018 acute extended ov/Justin Walters re:  Copd III / stopped symb too expensive, does better with nebs / overall not doing well since early Oct 2019  Chief Complaint  Patient presents with  . Acute Visit    SOB, runny nose, ear pain, wheezing, cough- white mucus at times, denies fever   brought meds/ not using ppi as instructed and ? If really using flutter, doesn't think it helps but did not bring it as rec to show him how to use/ using halls cough drops with menthol which was discouraged last ov/ gradually worse doe/ subj wheeze x sev weeks no better with hfa but technique poor/ no purulent mucus/ cough worse at hs and in am. No better on prednisone  rec Duoneb (albuterol and atrovent)  4 x daily Only use your albuterol as a rescue medication   Work on inhaler technique:  Be sure you take prilosec Take 30- 60 min before your first and last meals of the day  Change your appt to  - add sinus ct / hrct chest if still coughing on return      02/22/2018  f/u ov/Justin Walters re: COPD  GOLD III maint on duoneb tid  Chief Complaint  Patient presents with  . Follow-up    Increased SOB, wheezing, and cough- non over the past 3-4 wks. He is using his albuterol inhaler 3-4 x per day and neb 3 x per day.   Dyspnea:  Still does [employer] with hc parking no 02  Cough: p supper is the worst but never prod Sleeping: bed blocks/ wakes up tight in chest around 2 am p last neb at 7 pm  SABA use: as above  rec Plan A = Automatic = Symbicort 160 Take 2 puffs first thing in am and then another 2 puffs about 12 hours later and duoneb four times   Work on inhaler technique:   Plan B = Backup Only use your  albuterol inhaler as a rescue medication Prednisone 10 mg  Take 4 for three days 3 for three days 2 for three days 1 for three days and stop  Please see patient coordinator before you leave today  to schedule 02 2lpm 24/7 for now Please remember to go to the  x-ray department  for your tests - we will call you with the results when they are available   Please schedule a follow up office visit in 2 weeks, sooner if needed  with all medications /inhalers/ solutions in hand so we can verify exactly what you are taking. This includes all medications from all doctors and over the counters and bring your DRUG FORMULARY  - consider change coreg to bisoprolol or at least bystolic samples short run  - add levaquin 500 mg x 10 d for ? pna    03/08/2018  f/u ov/Justin Walters re:  COPD  III  On symb and duoneb qid  Chief Complaint  Patient presents with  . Follow-up    Breathing has improved back to baseline. He is not using his albuterol inhaler but is using his duoneb 4 x daily.   Dyspnea:  Better, usually does [employer] leaning on cart s 02  Cough: none now Sleeping: bed blocks  SABA use: none extra in hfa form  02: 2lpm at hs not with activity  rec Plan A = Automatic = Incruse/BREO  one click eam am and continue 02 2 lpm at bedtime and wear it  as needed during the day to keep your sats > 90% with walking or go slower Plan B = Backup Only use your albuterol inhaler as a rescue medication  Plan C = Crisis - only use your albuterol nebulizer(not your ipatropium)  if you first try Plan B and it fails to help > ok to use the nebulizer up to every 4 hours but if start needing it regularly call for immediate appointment Please remember to go to the  x-ray department  for your tests - we will call you with the results when they are available   Please schedule a follow up office visit in 2 weeks with your formulary , sooner if needed  with all medications /inhalers/ solutions in hand so we can verify exactly what  you are taking. This includes all medications from all doctors and over the counters (ok to see NP if I don't have opening )    03/25/2018  f/u ov/Justin Walters re:  COPD III overall worse since Oct 2019, esp since Feb 02 2018 Chief Complaint  Patient presents with  . Follow-up    Breathing has been progressively worse since the last visit. He is using his rescue inhaler daily and neb 4 x daily.   Dyspnea:  Across the room on 3lpm  Cough: turned green x sev days prior to OV / sometimes coughs so hard hurts in chest (ant/ generalized)  but not using flutter as rec   Sleeping: bed blocks does fine most nocts SABA use: as above  02:  3lpm 24/7  rec Change BREO / Incruse to Anoro click x one and take two good drags (hold with the R hand) Prednisone 10 mg take  4 each am x 2 days,   2 each am x 2 days,  1 each am x 2 days and stop  Augmentin 875 mg take one pill twice daily  X 10 days.  Plan B = Backup Only use your albuterol inhaler as a rescue medication Plan C = Crisis - only use your albuterol nebulizer(NOT your ipatropium/albuterol)  if you first try Plan B and it fails to help > ok to use the nebulizer up to every 4 hours but if start needing it regularly call for immediate appointment Add protonix 40 mg Take 30-60 min before first meal of the day  The goal with 02 is to keep your 02 saturations over 90% at all times     Admit date: 03/26/2018 Discharge date: 03/28/2018     Recommendations for Outpatient Follow-up:  1. Follow up with PCP in 1-2 weeks 2. Please obtain BMP/CBC in one week 3. Please follow up on the COVID-19 results > not detected        Brief/Interim Summary:78 y.o.malewith a history of3L O2-dependent COPD , HTN, HLD, nephrolithiasis s/p ESWL 2019, BPH with LUTS who presented with acute dyspnea and wheezing. He reports 1-2 weeks of increased, intermittent, moderate wheezing improved with home nebulizer  treatments, as well as slight increase in his chronic cough in  that it is more productive, reported blue-green.Was evaluated yesterday by pulmonology, CXR showed infiltrate, started on augmentin and prednisone which he had not started prior to adm.He lives with his daughter who works in [employer] and has continued working, but isn't ill.  ED Course:Initially in significant respiratory distress requiring BiPAP, given nebs, solumedrol. Temp 100.82F, tachypneic, tachycardic. WBC 86.7Y with neutrophilic predominance, lymphs 1.1k, creatinine 1.31, platelets 577, hgb 11. Flu negative, covid assay neg.Respiratory effort has improved, but has not returned to baseline, so admission was requested.      Discharge Diagnoses:  Principal Problem:   Acute on chronic respiratory failure with hypoxia and hypercapnia (HCC) Active Problems:   Hyperlipidemia   Essential hypertension   CKD (chronic kidney disease), stage III (HCC)   BPH (benign prostatic hyperplasia)   RENAL CALCULUS, HX OF   COPD exacerbation (HCC)   GERD (gastroesophageal reflux disease)   #1 acute on chronic hypoxic hypercapnic respiratory failure secondary to COPD exacerbation and possible pneumonia viral versus bacterial. Patient is on 3 L of oxygen prior to admission to the hospital. He lives at home with his daughter. His daughter works in [employer] and is continuing to work.  Family is able to quarantine him at home for 14 days or till COVID-19 results come back.  I will discharge him on Augmentin for 4 more days along with prednisone taper.  He was MRSA PCR negative.  Continue MDI as he was doing at home.. Chest x-ray 23 2020 shows hyperexpanded lungs with patchy airspace opacity at the bases likely due to pneumonia, less coarse interstitial thickening and patchy airspace opacity in the upper and mid lungs. Reticular interstitial disease, possible fibrosis. Strep pneumo negative.     #2 renal stones with pyuria patient asymptomatic.  #3 GERD continue Pepcid  #4  hypertension  Norvasc and coreg stopped due to soft blood pressure this may be restarted as an outpatient anytime as appropriate.        Virtual Visit via Telephone Note 05/06/2018   I connected with Justin Walters on 05/06/18 at 10:30 AM EDT by telephone and verified that I am speaking with the correct person using two identifiers.   I discussed the limitations, risks, security and privacy concerns of performing an evaluation and management service by telephone and the availability of in person appointments. I also discussed with the patient that there may be a patient responsible charge related to this service. The patient expressed understanding and agreed to proceed.   History of Present Illness: off coreg as of admit for aecopd ? pna Dyspnea:   Can do walks around house x 10 min /min pain on L ant/lat with deep breath Cough: minimal / mucoid/ am's  Sleeping: blocks under head  SABA use: not needing  02: 2lpm 24/7  duoneb qid / off prednisone completely    No obvious day to day or daytime variability or assoc excess/ purulent sputum or mucus plugs or hemoptysis or cp or chest tightness, subjective wheeze or overt sinus or hb symptoms.    Also denies any obvious fluctuation of symptoms with weather or environmental changes or other aggravating or alleviating factors except as outlined above.   Meds reviewed/ med reconciliation completed         Observations/Objective: Very positive affect, good phonation/ speaking in full sentences    Assessment and Plan: See problem list for active a/p's   Follow Up Instructions:  See avs for instructions unique to this ov which includes revised/ updated med list     I discussed the assessment and treatment plan with the patient. The patient was provided an opportunity to ask questions and all were answered. The patient agreed with the plan and demonstrated an understanding of the instructions.   The patient was advised to call back  or seek an in-person evaluation if the symptoms worsen or if the condition fails to improve as anticipated.  I provided 25 minutes of non-face-to-face time during this encounter.   Christinia Gully, MD

## 2018-05-06 NOTE — Assessment & Plan Note (Addendum)
Try off amolodipine 03/25/2018 due to low bp  - off coreg 03/26/18 for low bp   Clinically doing much better off hbp meds.  In the setting of respiratory symptoms of unknown etiology, if bp back up  It would be preferable to use bystolic, the most beta -1  selective Beta blocker available in sample form, with bisoprolol the most selective generic choice  on the market, at least on a trial basis, to make sure the spillover Beta 2 effects of the less specific Beta blockers are not contributing to this patient's symptoms.   I had an extended discussion with the patient reviewing all relevant studies including extensive review of his care before and during inpt admit    Please see AVS for specific instructions unique to this visit that I personally wrote and verbalized to the the pt in detail and then reviewed with pt  by my nurse highlighting any  changes in therapy recommended at today's visit to their plan of care.     Each maintenance medication was reviewed in detail including most importantly the difference between maintenance and as needed and under what circumstances the prns are to be used.  Please see AVS for specific  Instructions which are unique to this visit and I personally typed out  which were reviewed in detail over thephone with the patient and a copy avs provided via My Chart

## 2018-05-06 NOTE — Assessment & Plan Note (Signed)
01/25/2017 Patient Saturations on Room Air at Rest = 88% Patient Saturations on [employer] while Ambulating = 86% Patient Saturations on 2 Liters of oxygen while Ambulating = 91% - 02/22/2018 RA sats 88% at rest > resolved on 2lpm rec 24/7 02  - 03/08/2018   Walked RA  2 laps @  approx 245ft each @ slow pace  stopped due to  End of study / sats 88% p one lap and 84 p second so rec 2lpm with acitivities outside the house /titrate to keep > 90%    - as of 05/06/2018 using 2lpm 24/7 s desats walking

## 2018-05-06 NOTE — Patient Instructions (Signed)
Pt has my chart does not need paper copy but needs appt with all medications and cxr in 4 weeks

## 2018-05-06 NOTE — Assessment & Plan Note (Signed)
First detected 02/22/2018 vs baseline study 11/21/17 so rx levaquin 500 x 10 days  -  03/25/2018  See ov >>> HRCT rec p 6 d of pred and 10 d augmentin  For ? Superimposed pna  Did not complete rx but clinically improved so hold further abx for now

## 2018-05-06 NOTE — Assessment & Plan Note (Signed)
Quit smoking 1995 PFT's  01/20/2017  FEV1 1.05 (39 % ) ratio 54  p 18 % improvement from saba p ? prior to study with DLCO  50/53  % corrects to 77 % for alv volume  - 01/25/2017   rec increase symb to 160 2bid trial  - alpha one AT screening  01/18/17   MM  Level 190 PFT's  02/23/2017  FEV1 1.17 (44 % ) ratio 54  p 0 % improvement from saba p symb 160 prior to study with DLCO   39 % corrects to 59 % for alv volume  - 02/23/2017    add tudorza trial > no change and could not afford - 12/05/2017 flutter valve added   - 02/22/2018  After extensive coaching inhaler device,  effectiveness =    75% > restart symb 160 2bid - 03/08/2018  After extensive coaching inhaler device,  effectiveness =    90% with elipta so try trelegy (no samples so gave 2 weeks of samples of breo 100/incruse)   - 05/06/2018 post aecopd changed to just duoneb qid   Clearly doing much better for now on less meds, nothing but saba/sama since d/c and tapered off pred s flare so far. Notable that coreg 12.5 bid stopped at admit due to low bp and not restarted.   For now use KIS principle and no change maint rx and f/u in 4 weeks with all meds in hand using a trust but verify approach to confirm accurate Medication  Reconciliation The principal here is that until we are certain that the  patients are doing what we've asked, it makes no sense to ask them to do more.

## 2018-05-23 DIAGNOSIS — J9611 Chronic respiratory failure with hypoxia: Secondary | ICD-10-CM | POA: Diagnosis not present

## 2018-05-23 DIAGNOSIS — J449 Chronic obstructive pulmonary disease, unspecified: Secondary | ICD-10-CM | POA: Diagnosis not present

## 2018-05-30 DIAGNOSIS — J449 Chronic obstructive pulmonary disease, unspecified: Secondary | ICD-10-CM | POA: Diagnosis not present

## 2018-06-06 ENCOUNTER — Other Ambulatory Visit: Payer: Self-pay

## 2018-06-06 ENCOUNTER — Ambulatory Visit (INDEPENDENT_AMBULATORY_CARE_PROVIDER_SITE_OTHER): Payer: Medicare HMO | Admitting: Internal Medicine

## 2018-06-06 ENCOUNTER — Encounter: Payer: Self-pay | Admitting: Internal Medicine

## 2018-06-06 ENCOUNTER — Ambulatory Visit (INDEPENDENT_AMBULATORY_CARE_PROVIDER_SITE_OTHER): Payer: Medicare HMO

## 2018-06-06 DIAGNOSIS — J449 Chronic obstructive pulmonary disease, unspecified: Secondary | ICD-10-CM | POA: Diagnosis not present

## 2018-06-06 DIAGNOSIS — J9611 Chronic respiratory failure with hypoxia: Secondary | ICD-10-CM | POA: Diagnosis not present

## 2018-06-06 DIAGNOSIS — R918 Other nonspecific abnormal finding of lung field: Secondary | ICD-10-CM

## 2018-06-06 DIAGNOSIS — I10 Essential (primary) hypertension: Secondary | ICD-10-CM | POA: Diagnosis not present

## 2018-06-06 NOTE — Patient Instructions (Signed)
Monitor your 02 saturations toward the end of your activity and try to build it back up to 30 min target with 02 sats over 90% is the target    No change in inhalers    Please schedule a follow up visit in 3 months but call sooner if needed  with all medications /inhalers/ solutions in hand so we can verify exactly what you are taking. This includes all medications from all doctors and over the counters

## 2018-06-06 NOTE — Progress Notes (Signed)
Subjective:     Patient ID: Justin Walters, male   DOB: 10-20-1940,     MRN: [id number]    Brief patient profile:  5 yowm MM quit smoking 1995 with cough/wheeze/ sob and never recovered with documented GOLD III copd 02/23/2017      History of Present Illness  01/25/2017 1st  office visit/ Justin Walters   GOLD III with reversibility  Chief Complaint  Patient presents with  . Pulmonary Consult    Referred by the hospital. He c/o SOB and coughing for the past several wks. His cough is occ prod, but he is unsure of sputum color. He is SOB with exertion and also when he lies down.  He gets winded walking approx 100 yards. He is using his albuterol inhaler 3 x per wk on average.   baseline doe across the street to son's house  nl pace and then onset   x one year gradually worse doe and no better on inhalers but poor baseline hfa/ assoc with cough / sense of chest congestion and has to prop up at 30 degrees now at hs to get comfortable Present doe = MMRC3 = can't walk 100 yards even at a slow pace at a flat grade s stopping due to sob  Easily confused with details of care/ names of meds / not using spiriva/ was better on pred and worse since tapered rec Plan A = Automatic =  symbicort 160 Take 2 puffs first thing in am and then another 2 puffs about 12 hours later.  Work on inhaler technique:  Plan B = Backup Only use your albuterol  as a rescue  Prednisone 10 mg take  4 each am x 2 days,   2 each am x 2 days,  1 each am x 2 days and stop     02/23/2017  f/u ov/Justin Walters re:  GOLD III copd / no 02 Chief Complaint  Patient presents with  . Follow-up    PFT's today.  Breathing has improved some. He is rarely using his albuterol inhaler. He has been feeling fatigued.    better p prednisone then gradually worse  Dyspnea:  MMRC2 = can't walk a nl pace on a flat grade s sob but does fine slow and flat  Cough: none Sleep: ok 30 degrees no am congestion rec Add tudorza one puff after symbicort in am and in pm   Work on inhaler technique:      05/24/2017  f/u ov/Justin Walters re:  Copd III/ no 02  Just on maint symb 160 / could not afford tudorza and not sure it helped doe  Chief Complaint  Patient presents with  . Follow-up    Breathing is doing well. He rarely uses his albuterol inhaler. No new co's.   Dyspnea:  MMRC2 = can't walk a nl pace on a flat grade s sob but does fine slow pace pretty much anywhere he wants to go  Cough: none Sleep: ok flat SABA use:  Rare  Hb every hs x sev months / resolves p about an hour, better p sit up / no assoc dysphagia or daytime symptoms/ varies some with how early he eats supper and how much, not sure what type meals make it worse  rec GERD diet  If not better add pepcid ac 20 mg one hour before bed > did not  Do   10/31/17 NP eval for 3-4 weeks worse sob/ cough rec Xopenex in office  Depo 80 Prednisone 10mg  tablet  >>>  4 tabs for 2 days, then 3 tabs for 2 days, 2 tabs for 2 days, then 1 tab for 2 days, then sto Azithromycin 250mg  tablet  >>>Take 2 tablets (500mg  total) today, and then 1 tablet (250mg ) for the next four days      11/21/2017 acute extended ov/Justin Walters re: refractory cough x early oct 2019  / no change sob  Chief Complaint  Patient presents with  . Acute Visit    pt c/o sinus congestion, pnd, prod cough with white mucus X1 month.  has had 2 rounds of pred and doxy with little relief.   dyspnea : MMRC2 = can't walk a nl pace on a flat grade s sob but does fine slow and flat= baseline when not coughing  Cough > white mucus  not worse in am's/ harsh daytime coughing fts Sleeping flat two pillow saba  Not at all now rec Plan A = Automatic = symbicort 160 Take 2 puffs first thing in am and then another 2 puffs about 12 hours later.  Whenever your cough flares > try  Try prilosec otc 20mg   Take 30-60 min before first meal of the day and Pepcid ac (famotidine) 20 mg one after hour after supper or at bedtime until cough is completely gone for at  least a week without the need for cough suppression Plan B = Backup Only use your albuterol inhaler as a rescue medication  goes up over your usual need - Don't leave home without it !!  (think of it like the spare tire for your car)  For cough Mucinex dm 1200 mg every 12 hours and the tylenol #3  Up to every 4 hours if need for persistent irritating coughing  GERD diet      12/05/2017 acute extended ov/Justin Walters re: cough and sob worse since oc 2019 Chief Complaint  Patient presents with  . Acute Visit    Increased SOB, cough and chest congestion x 1 month. Sputum is white to yellow. He is using his albuterol inhaler 2 x per wk on average.   cough is worse p supper and fine on back with  flat bed/ no blocks  In am feels fine but groggy p benadryl - can't  sleep s it W/in 20 min of rising lots of pnds > white mucus  Not using mucinex dm / reversing ppi and h2  Technique on symb poor and not using saba correctly in terms or when to use  rec Augmentin 875 mg take one pill twice daily  X 10 days - take at breakfast and supper with large glass of water.  .  Take prednisone  4 for three days 3 for three days 2 for three days 1 for three days and stop  Omeprazole 20 mg Take 30- 60 min before your first and last meals of the day and continue famotidine 20 mg at bedtime Bed blocks would be a great idea Try off zquil and tylenol #3  Up to 2 at bedtime For cough > mucinex dm 1200 mg every 12 hours and use the flutter valve as much as possible and if still can't stop coughing then use the tylenol #3        12/20/2017  f/u ov/Justin Walters re:  Cough better, doe not quite baseline on symb 160 2bid  No worse off pred Chief Complaint  Patient presents with  . Follow-up    Breathing has improved some but is not back to his normal baseline. He rarely uses  his albuterol inhaler.   Dyspnea:  MMRC2 = can't walk a nl pace on a flat grade s sob but does fine slow and flat  Cough: better/ no mucus production at all   Sleeping: ok flat  SABA use: rarely 02: none   rec Bed blocks 6-8 inches  For cough > Mucinex dm up to 1200 mg twice daily and use the flutter valve as much as you can Gabapentin should be three times daily  Omeprazole is Take 30-60 min before first meal of the day and pepcid ac 20 mg after supper     01/25/2018 acute extended ov/Justin Walters re:  Copd III / stopped symb too expensive, does better with nebs / overall not doing well since early Oct 2019  Chief Complaint  Patient presents with  . Acute Visit    SOB, runny nose, ear pain, wheezing, cough- white mucus at times, denies fever   brought meds/ not using ppi as instructed and ? If really using flutter, doesn't think it helps but did not bring it as rec to show him how to use/ using halls cough drops with menthol which was discouraged last ov/ gradually worse doe/ subj wheeze x sev weeks no better with hfa but technique poor/ no purulent mucus/ cough worse at hs and in am. No better on prednisone  rec Duoneb (albuterol and atrovent)  4 x daily Only use your albuterol as a rescue medication   Work on inhaler technique:  Be sure you take prilosec Take 30- 60 min before your first and last meals of the day  Change your appt to  - add sinus ct / hrct chest if still coughing on return      02/22/2018  f/u ov/Justin Walters re: COPD  GOLD III maint on duoneb tid  Chief Complaint  Patient presents with  . Follow-up    Increased SOB, wheezing, and cough- non over the past 3-4 wks. He is using his albuterol inhaler 3-4 x per day and neb 3 x per day.   Dyspnea:  Still does [employer] with hc parking no 02  Cough: p supper is the worst but never prod Sleeping: bed blocks/ wakes up tight in chest around 2 am p last neb at 7 pm  SABA use: as above  rec Plan A = Automatic = Symbicort 160 Take 2 puffs first thing in am and then another 2 puffs about 12 hours later and duoneb four times   Work on inhaler technique:   Plan B = Backup Only use your  albuterol inhaler as a rescue medication Prednisone 10 mg  Take 4 for three days 3 for three days 2 for three days 1 for three days and stop  Please see patient coordinator before you leave today  to schedule 02 2lpm 24/7 for now Please remember to go to the  x-ray department  for your tests - we will call you with the results when they are available   Please schedule a follow up office visit in 2 weeks, sooner if needed  with all medications /inhalers/ solutions in hand so we can verify exactly what you are taking. This includes all medications from all doctors and over the counters and bring your DRUG FORMULARY  - consider change coreg to bisoprolol or at least bystolic samples short run  - add levaquin 500 mg x 10 d for ? pna    03/08/2018  f/u ov/Justin Walters re:  COPD  III  On symb and duoneb qid  Chief Complaint  Patient presents with  . Follow-up    Breathing has improved back to baseline. He is not using his albuterol inhaler but is using his duoneb 4 x daily.   Dyspnea:  Better, usually does [employer] leaning on cart s 02  Cough: none now Sleeping: bed blocks  SABA use: none extra in hfa form  02: 2lpm at hs not with activity  rec Plan A = Automatic = Incruse/BREO  one click eam am and continue 02 2 lpm at bedtime and wear it  as needed during the day to keep your sats > 90% with walking or go slower Plan B = Backup Only use your albuterol inhaler as a rescue medication  Plan C = Crisis - only use your albuterol nebulizer(not your ipatropium)  if you first try Plan B and it fails to help > ok to use the nebulizer up to every 4 hours but if start needing it regularly call for immediate appointment Please remember to go to the  x-ray department  for your tests - we will call you with the results when they are available   Please schedule a follow up office visit in 2 weeks with your formulary , sooner if needed  with all medications /inhalers/ solutions in hand so we can verify exactly what  you are taking. This includes all medications from all doctors and over the counters (ok to see NP if I don't have opening )    03/25/2018  f/u ov/Justin Walters re:  COPD III overall worse since Oct 2019, esp since Feb 02 2018 Chief Complaint  Patient presents with  . Follow-up    Breathing has been progressively worse since the last visit. He is using his rescue inhaler daily and neb 4 x daily.   Dyspnea:  Across the room on 3lpm  Cough: turned green x sev days prior to OV / sometimes coughs so hard hurts in chest (ant/ generalized)  but not using flutter as rec   Sleeping: bed blocks does fine most nocts SABA use: as above  02:  3lpm 24/7  rec Change BREO / Incruse to Anoro click x one and take two good drags (hold with the R hand) Prednisone 10 mg take  4 each am x 2 days,   2 each am x 2 days,  1 each am x 2 days and stop  Augmentin 875 mg take one pill twice daily  X 10 days -  Plan B = Backup Only use your albuterol inhaler as a rescue medication Plan C = Crisis - only use your albuterol nebulizer(NOT your ipatropium/albuterol)  if you first try Plan B Add protonix 40 mg Take 30-60 min before first meal of the day  The goal with 02 is to keep your 02 saturations over 90% at all times   Admit date: 03/26/2018 Discharge date: 03/28/2018  Admitted From: Home Disposition: Home  Brief/Interim Summary:78 y.o.malewith a history of3L O2-dependent COPD , HTN, HLD, nephrolithiasis s/p ESWL 2019, BPH with LUTS who presented with acute dyspnea and wheezing. He reports 1-2 weeks of increased, intermittent, moderate wheezing improved with home nebulizer treatments, as well as slight increase in his chronic cough in that it is more productive, reported blue-green.Was evaluated yesterday by pulmonology, CXR showed infiltrate, started on augmentin and prednisone which he hasn't taken yet.He lives with his daughter who works in [employer] and has continued working, but isn't ill.  ED  Course:Initially in significant respiratory distress requiring BiPAP, given  nebs, solumedrol. Temp 100.68F, tachypneic, tachycardic. WBC 81.0F with neutrophilic predominance, lymphs 1.1k, creatinine 1.31, platelets 577, hgb 11. Flu negative, covid assay sent.Respiratory effort has improved, but has not returned to baseline, so admission was requested.  Review of Systems:He denies fever, chills, has some chest tightness when coughing only. No leg swelling, orthopnea, PND. He denies abdominal pain, dysuria, or change in urinary symptoms but does have LUTS from BPH which are stable. Otherwiseper HPI. All others reviewed and are negative.     Discharge Diagnoses:  Principal Problem:   Acute on chronic respiratory failure with hypoxia and hypercapnia (HCC) Active Problems:   Hyperlipidemia   Essential hypertension   CKD (chronic kidney disease), stage III (HCC)   BPH (benign prostatic hyperplasia)   RENAL CALCULUS, HX OF   COPD exacerbation (HCC)   GERD (gastroesophageal reflux disease)   #1 acute on chronic hypoxic hypercapnic respiratory failure secondary to COPD exacerbation and possible pneumonia viral versus bacterial. Patient is on 3 L of oxygen prior to admission to the hospital. He lives at home with his daughter. His daughter works in [employer] and is continuing to work.  Family is able to quarantine him at home for 14 days or till COVID-19 results come back.  I will discharge him on Augmentin for 4 more days along with prednisone taper.  He was MRSA PCR negative.  Continue MDI as he was doing at home.. Chest x-ray 23 2020 shows hyperexpanded lungs with patchy airspace opacity at the bases likely due to pneumonia, less coarse interstitial thickening and patchy airspace opacity in the upper and mid lungs. Reticular interstitial disease, possible fibrosis. Strep pneumo negative. Covid 19 neg pcr  #2 renal stones with pyuria patient asymptomatic.  #3 GERD continue  Pepcid  #4 hypertension risk and Coreg were stopped due to soft blood pressure this may be restarted as an outpatient anytime as appropriate.    06/06/2018  f/u ov/Virgin Zellers re: post hosp off norvasc/ coreg(low bp) and much better since d/c and still on Anoro  Chief Complaint  Patient presents with  . Follow-up    Patient states that he's doing better. He has not had to use his rescue inhaler since last visit.   Dyspnea:  300 yards s stopping to son's house s 02 -  does not check sats Cough: minimal non productive Sleeping: on blocks x 6 in  SABA use: none  02: not using    No obvious day to day or daytime variability or assoc excess/ purulent sputum or mucus plugs or hemoptysis or cp or chest tightness, subjective wheeze or overt sinus or hb symptoms.   Sleeping as above  without nocturnal  or early am exacerbation  of respiratory  c/o's or need for noct saba. Also denies any obvious fluctuation of symptoms with weather or environmental changes or other aggravating or alleviating factors except as outlined above   No unusual exposure hx or h/o childhood pna/ asthma or knowledge of premature birth.  Current Allergies, Complete Past Medical History, Past Surgical History, Family History, and Social History were reviewed in [facility] record.  ROS  The following are not active complaints unless bolded Hoarseness, sore throat, dysphagia, dental problems, itching, sneezing,  nasal congestion or discharge of excess mucus or purulent secretions, ear ache,   fever, chills, sweats, unintended wt loss or wt gain, classically pleuritic or exertional cp,  orthopnea pnd or arm/hand swelling  or leg swelling, presyncope, palpitations, abdominal pain, anorexia, nausea, vomiting, diarrhea  or change in bowel habits or change in bladder habits, change in stools or change in urine, dysuria, hematuria,  rash, arthralgias, visual complaints, headache, numbness, weakness or ataxia or  problems with walking or coordination,  change in mood or  memory.        Current Meds  Medication Sig  . acetaminophen (TYLENOL) 325 MG tablet Take 650 mg by mouth every 6 (six) hours as needed for mild pain or headache.  . albuterol (PROVENTIL HFA;VENTOLIN HFA) 108 (90 Base) MCG/ACT inhaler INHALE 2 PUFFS INTO THE LUNGS EVERY 6 (SIX) HOURS AS NEEDED FOR WHEEZING OR SHORTNESS OF BREATH.  Marland Kitchen albuterol (PROVENTIL) (2.5 MG/3ML) 0.083% nebulizer solution Take 3 mLs (2.5 mg total) by nebulization every 4 (four) hours as needed for wheezing or shortness of breath.  Jearl Klinefelter ELLIPTA 62.5-25 MCG/INH AEPB INHALE 1 PUFF BY MOUTH ONCE DAILY  . aspirin 81 MG EC tablet Take 1 tablet (81 mg total) by mouth daily. Swallow whole.  . gabapentin (NEURONTIN) 300 MG capsule TAKE 1 CAPSULE THREE TIMES DAILY (Patient taking differently: Take 300 mg by mouth 3 (three) times daily. TAKE 1 CAPSULE THREE TIMES DAILY)  . Guaifenesin (MUCINEX MAXIMUM STRENGTH) 1200 MG TB12 Take 1 tablet by mouth 2 (two) times daily.  Marland Kitchen ipratropium-albuterol (DUONEB) 0.5-2.5 (3) MG/3ML SOLN Inhale 3 mLs into the lungs 4 (four) times daily. 1 vial in nebulizer 4 x daily  . OVER THE COUNTER MEDICATION Take 2 capsules by mouth at bedtime. Vicks "Zzzs" capsule for sleep.  . pantoprazole (PROTONIX) 40 MG tablet Take 1 tablet (40 mg total) by mouth daily. Take 30-60 min before first meal of the day  . Respiratory Therapy Supplies (FLUTTER) DEVI Use as directed  . rosuvastatin (CRESTOR) 20 MG tablet TAKE 1 TABLET EVERY DAY  . Saccharomyces boulardii (PROBIOTIC) 250 MG CAPS Take 1 capsule by mouth daily.  Marland Kitchen terazosin (HYTRIN) 10 MG capsule TAKE 1 CAPSULE AT BEDTIME (Patient taking differently: Take 10 mg by mouth at bedtime. )  .     Marland Kitchen                Objective:   Physical Exam  amb wm nad   06/06/2018          171  03/25/2018       155  03/08/2018         149   01/25/2018       156  12/05/2017       157  11/21/2017     157  05/24/2017         164    01/25/17 150 lb 12.8 oz (68.4 kg)  01/18/17 150 lb (68 kg)  01/08/17 150 lb (68 kg)    Vital signs reviewed - Note on arrival 02 sats  99% on RA     HEENT: Upper partial plate/ nl  oropharynx. Nl external ear canals without cough reflex -  Mild bilateral non-specific turbinate edema  ,  Mod barrel  contour chest        HEENT: Upper partial plate, nl oropharynx. Nl external ear canals without cough reflex -  Mild bilateral non-specific turbinate edema     NECK :  without JVD/Nodes/TM/ nl carotid upstrokes bilaterally   LUNGS: no acc muscle use,  Mod barrel  contour chest wall with bilateral  Distant bs s audible wheeze and  without cough on insp or exp maneuver and mod  Hyperresonant  to  percussion bilaterally  CV:  RRR  no s3 or murmur or increase in P2, and no edema   ABD:  soft and nontender with pos mid  insp Hoover's  in the supine position. No bruits or organomegaly appreciated, bowel sounds nl  MS:   Nl gait/  ext warm without deformities, calf tenderness, cyanosis or clubbing No obvious joint restrictions   SKIN: warm and dry without lesions    NEURO:  alert, approp, nl sensorium with  no motor or cerebellar deficits apparent.        CXR PA and Lateral:   06/06/2018 :    I personally reviewed images and agree with radiology impression as follows:   Chronic interstitial lung disease. Previously seen patchy bilateral infiltrates have resolved.     Assessment:

## 2018-06-08 ENCOUNTER — Encounter: Payer: Self-pay | Admitting: Internal Medicine

## 2018-06-08 NOTE — Assessment & Plan Note (Signed)
Qualified for 02 2019 01/25/2017 Patient Saturations on Room Air at Rest = 88% Patient Saturations on [employer] while Ambulating = 86% Patient Saturations on 2 Liters of oxygen while Ambulating = 91% - 02/22/2018 RA sats 88% at rest > resolved on 2lpm rec 24/7 02  - 03/08/2018   Walked RA  2 laps @  approx 230ft each @ slow pace  stopped due to  End of study / sats 88% p one lap and 84 p second so rec 2lpm with acitivities outside the house /titrate to keep > 90%   - as of 05/06/2018 using 2lpm 24/7 s desats walking   - as of 06/06/2018 no desats on RA walking  - as of 06/06/2018 d/c'd 02 >>  rec check sats walking   Adequate control on present rx, reviewed in detail with pt > no change in rx needed  = no 02 indicated at present  .I had an extended discussion with the patient  reviewing all relevant studies completed to date and  lasting 15 to 20 minutes of a 25 minute visit  which included directly observing ambulatory 02 saturation study documented in a/p section of  today's  office note.  Each maintenance medication was reviewed in detail including most importantly the difference between maintenance and prns and under what circumstances the prns are to be triggered using an action plan format that is not reflected in the computer generated alphabetically organized AVS.     Please see AVS for specific instructions unique to this visit that I personally wrote and verbalized to the the pt in detail and then reviewed with pt  by my nurse highlighting any changes in therapy recommended at today's visit .

## 2018-06-08 NOTE — Assessment & Plan Note (Signed)
Try off amolodipine 03/25/2018 due to low bp  - off coreg 03/26/18 for low bp > improved resp symptoms   Both amlodipine and coreg have potential pulmonary side effects and doing fine on just hytrin which also may be helping bladder issues (which may exac on lama but so far so good)   Adequate control on present rx, reviewed in detail with pt > no change in rx needed  = hytrin only

## 2018-06-08 NOTE — Assessment & Plan Note (Signed)
Quit smoking 1995 PFT's  01/20/2017  FEV1 1.05 (39 % ) ratio 54  p 18 % improvement from saba p ? prior to study with DLCO  50/53  % corrects to 77 % for alv volume  - 01/25/2017   rec increase symb to 160 2bid trial  - alpha one AT screening  01/18/17   MM  Level 190 PFT's  02/23/2017  FEV1 1.17 (44 % ) ratio 54  p 0 % improvement from saba p symb 160 prior to study with DLCO   39 % corrects to 59 % for alv volume  - 02/23/2017    add tudorza trial > no change and could not afford - 12/05/2017 flutter valve added   - 02/22/2018  After extensive coaching inhaler device,  effectiveness =    75% > restart symb 160 2bid - 03/08/2018  After extensive coaching inhaler device,  effectiveness =    90% with elipta so try Anoro daily > improved as of 06/06/2018    At this point Pt is Group B in terms of symptom/risk and laba/lama therefore appropriate rx at this point >>>  Continue anoro unless flares on this then change to trelegy

## 2018-06-10 ENCOUNTER — Other Ambulatory Visit: Payer: Self-pay | Admitting: Internal Medicine

## 2018-06-23 DIAGNOSIS — J9611 Chronic respiratory failure with hypoxia: Secondary | ICD-10-CM | POA: Diagnosis not present

## 2018-06-23 DIAGNOSIS — J449 Chronic obstructive pulmonary disease, unspecified: Secondary | ICD-10-CM | POA: Diagnosis not present

## 2018-06-30 DIAGNOSIS — J449 Chronic obstructive pulmonary disease, unspecified: Secondary | ICD-10-CM | POA: Diagnosis not present

## 2018-07-02 ENCOUNTER — Other Ambulatory Visit: Payer: Self-pay | Admitting: Internal Medicine

## 2018-07-02 DIAGNOSIS — R918 Other nonspecific abnormal finding of lung field: Secondary | ICD-10-CM

## 2018-07-17 ENCOUNTER — Other Ambulatory Visit (INDEPENDENT_AMBULATORY_CARE_PROVIDER_SITE_OTHER): Payer: Medicare HMO

## 2018-07-17 ENCOUNTER — Other Ambulatory Visit: Payer: Self-pay | Admitting: Internal Medicine

## 2018-07-17 DIAGNOSIS — Z0000 Encounter for general adult medical examination without abnormal findings: Secondary | ICD-10-CM | POA: Diagnosis not present

## 2018-07-17 DIAGNOSIS — R7302 Impaired glucose tolerance (oral): Secondary | ICD-10-CM

## 2018-07-17 DIAGNOSIS — N289 Disorder of kidney and ureter, unspecified: Secondary | ICD-10-CM

## 2018-07-17 LAB — CBC WITH DIFFERENTIAL/PLATELET
Basophils Absolute: 0.1 10*3/uL (ref 0.0–0.1)
Basophils Relative: 0.9 % (ref 0.0–3.0)
Eosinophils Absolute: 0.3 10*3/uL (ref 0.0–0.7)
Eosinophils Relative: 4.9 % (ref 0.0–5.0)
HCT: 38.8 % — ABNORMAL LOW (ref 39.0–52.0)
Hemoglobin: 12.7 g/dL — ABNORMAL LOW (ref 13.0–17.0)
Lymphocytes Relative: 28.7 % (ref 12.0–46.0)
Lymphs Abs: 1.6 10*3/uL (ref 0.7–4.0)
MCHC: 32.8 g/dL (ref 30.0–36.0)
MCV: 88.3 fl (ref 78.0–100.0)
Monocytes Absolute: 0.8 10*3/uL (ref 0.1–1.0)
Monocytes Relative: 14.6 % — ABNORMAL HIGH (ref 3.0–12.0)
Neutro Abs: 2.9 10*3/uL (ref 1.4–7.7)
Neutrophils Relative %: 50.9 % (ref 43.0–77.0)
Platelets: 205 10*3/uL (ref 150.0–400.0)
RBC: 4.4 Mil/uL (ref 4.22–5.81)
RDW: 14.1 % (ref 11.5–15.5)
WBC: 5.8 10*3/uL (ref 4.0–10.5)

## 2018-07-17 LAB — HEPATIC FUNCTION PANEL
ALT: 7 U/L (ref 0–53)
AST: 14 U/L (ref 0–37)
Albumin: 4.2 g/dL (ref 3.5–5.2)
Alkaline Phosphatase: 47 U/L (ref 39–117)
Bilirubin, Direct: 0.1 mg/dL (ref 0.0–0.3)
Total Bilirubin: 0.4 mg/dL (ref 0.2–1.2)
Total Protein: 6.7 g/dL (ref 6.0–8.3)

## 2018-07-17 LAB — BASIC METABOLIC PANEL
BUN: 24 mg/dL — ABNORMAL HIGH (ref 6–23)
CO2: 26 mEq/L (ref 19–32)
Calcium: 8.8 mg/dL (ref 8.4–10.5)
Chloride: 107 mEq/L (ref 96–112)
Creatinine, Ser: 1.79 mg/dL — ABNORMAL HIGH (ref 0.40–1.50)
GFR: 36.96 mL/min — ABNORMAL LOW (ref >60.00)
Glucose, Bld: 96 mg/dL (ref 70–99)
Potassium: 3.9 mEq/L (ref 3.5–5.1)
Sodium: 142 mEq/L (ref 135–145)

## 2018-07-17 LAB — URINALYSIS, ROUTINE W REFLEX MICROSCOPIC
Bilirubin Urine: NEGATIVE
Ketones, ur: NEGATIVE
Nitrite: POSITIVE — AB
Specific Gravity, Urine: 1.025 (ref 1.000–1.030)
Urine Glucose: NEGATIVE
Urobilinogen, UA: 0.2 (ref 0.0–1.0)
pH: 5.5 (ref 5.0–8.0)

## 2018-07-17 LAB — LIPID PANEL
Cholesterol: 122 mg/dL (ref 0–200)
HDL: 38.9 mg/dL — ABNORMAL LOW (ref >39.00)
LDL Cholesterol: 64 mg/dL (ref 0–99)
NonHDL: 83.26
Total CHOL/HDL Ratio: 3
Triglycerides: 96 mg/dL (ref 0.0–149.0)
VLDL: 19.2 mg/dL (ref 0.0–40.0)

## 2018-07-17 LAB — TSH: TSH: 2.91 u[IU]/mL (ref 0.35–4.50)

## 2018-07-17 LAB — HEMOGLOBIN A1C: Hgb A1c MFr Bld: 5.7 % (ref 4.6–6.5)

## 2018-07-17 LAB — PSA: PSA: 0.96 ng/mL (ref 0.10–4.00)

## 2018-07-18 ENCOUNTER — Telehealth: Payer: Self-pay

## 2018-07-18 NOTE — Telephone Encounter (Signed)
Called pt, LVM.   CRM created.  

## 2018-07-18 NOTE — Telephone Encounter (Signed)
-----   Message from Biagio Borg, MD sent at 07/17/2018  8:41 PM EDT ----- Pt has appt July 28  Labs are c/w mild to mod slowing kidneys compared to previous  Please ask pt to drink more fluids in the next few days, and repeat the kidney blood testing on Mon July 20  Justin Walters to please inform pt, I will do order

## 2018-07-22 ENCOUNTER — Telehealth: Payer: Self-pay

## 2018-07-22 ENCOUNTER — Other Ambulatory Visit (INDEPENDENT_AMBULATORY_CARE_PROVIDER_SITE_OTHER): Payer: Medicare HMO

## 2018-07-22 ENCOUNTER — Other Ambulatory Visit: Payer: Self-pay | Admitting: Internal Medicine

## 2018-07-22 DIAGNOSIS — N289 Disorder of kidney and ureter, unspecified: Secondary | ICD-10-CM | POA: Diagnosis not present

## 2018-07-22 DIAGNOSIS — N183 Chronic kidney disease, stage 3 unspecified: Secondary | ICD-10-CM

## 2018-07-22 LAB — BASIC METABOLIC PANEL
BUN: 19 mg/dL (ref 6–23)
CO2: 25 mEq/L (ref 19–32)
Calcium: 8.9 mg/dL (ref 8.4–10.5)
Chloride: 105 mEq/L (ref 96–112)
Creatinine, Ser: 1.73 mg/dL — ABNORMAL HIGH (ref 0.40–1.50)
GFR: 38.45 mL/min — ABNORMAL LOW (ref >60.00)
Glucose, Bld: 97 mg/dL (ref 70–99)
Potassium: 4.4 mEq/L (ref 3.5–5.1)
Sodium: 139 mEq/L (ref 135–145)

## 2018-07-22 NOTE — Telephone Encounter (Signed)
-----   Message from Biagio Borg, MD sent at 07/22/2018 11:01 AM EDT ----- Left message on MyChart, pt to cont same tx except  The test results show that your current treatment is OK, except the kidney function is just not much improved.  We need to order a Renal Ultrasound (kidney ultrasound) and Refer to Nephrology kidney doctors) (which also may take even more than a month).  We'll also see you at your upcoming appt soon.    Shirron to please inform pt, I will do referral and order

## 2018-07-22 NOTE — Telephone Encounter (Signed)
Pt has been informed of results and expressed understanding.  °

## 2018-07-23 DIAGNOSIS — J449 Chronic obstructive pulmonary disease, unspecified: Secondary | ICD-10-CM | POA: Diagnosis not present

## 2018-07-23 DIAGNOSIS — J9611 Chronic respiratory failure with hypoxia: Secondary | ICD-10-CM | POA: Diagnosis not present

## 2018-07-29 ENCOUNTER — Ambulatory Visit
Admission: RE | Admit: 2018-07-29 | Discharge: 2018-07-29 | Disposition: A | Discharge status: Home / Self Care | Payer: Medicare HMO | Source: Ambulatory Visit | Attending: Internal Medicine | Admitting: Internal Medicine

## 2018-07-29 DIAGNOSIS — N183 Chronic kidney disease, stage 3 unspecified: Secondary | ICD-10-CM

## 2018-07-29 DIAGNOSIS — N189 Chronic kidney disease, unspecified: Secondary | ICD-10-CM | POA: Diagnosis not present

## 2018-07-30 ENCOUNTER — Encounter: Payer: Self-pay | Admitting: Internal Medicine

## 2018-07-30 ENCOUNTER — Other Ambulatory Visit: Payer: Self-pay

## 2018-07-30 ENCOUNTER — Ambulatory Visit (INDEPENDENT_AMBULATORY_CARE_PROVIDER_SITE_OTHER): Payer: Medicare HMO | Admitting: Internal Medicine

## 2018-07-30 VITALS — BP 108/64 | HR 85 | Temp 97.7°F | Ht 67.0 in | Wt 172.0 lb

## 2018-07-30 DIAGNOSIS — N183 Chronic kidney disease, stage 3 unspecified: Secondary | ICD-10-CM

## 2018-07-30 DIAGNOSIS — J449 Chronic obstructive pulmonary disease, unspecified: Secondary | ICD-10-CM | POA: Diagnosis not present

## 2018-07-30 DIAGNOSIS — E559 Vitamin D deficiency, unspecified: Secondary | ICD-10-CM | POA: Diagnosis not present

## 2018-07-30 DIAGNOSIS — E785 Hyperlipidemia, unspecified: Secondary | ICD-10-CM

## 2018-07-30 DIAGNOSIS — R7302 Impaired glucose tolerance (oral): Secondary | ICD-10-CM

## 2018-07-30 DIAGNOSIS — E538 Deficiency of other specified B group vitamins: Secondary | ICD-10-CM | POA: Diagnosis not present

## 2018-07-30 DIAGNOSIS — Z23 Encounter for immunization: Secondary | ICD-10-CM

## 2018-07-30 DIAGNOSIS — M19041 Primary osteoarthritis, right hand: Secondary | ICD-10-CM

## 2018-07-30 DIAGNOSIS — M19049 Primary osteoarthritis, unspecified hand: Secondary | ICD-10-CM | POA: Insufficient documentation

## 2018-07-30 DIAGNOSIS — Z0000 Encounter for general adult medical examination without abnormal findings: Secondary | ICD-10-CM | POA: Diagnosis not present

## 2018-07-30 DIAGNOSIS — E611 Iron deficiency: Secondary | ICD-10-CM

## 2018-07-30 DIAGNOSIS — M19042 Primary osteoarthritis, left hand: Secondary | ICD-10-CM

## 2018-07-30 DIAGNOSIS — I10 Essential (primary) hypertension: Secondary | ICD-10-CM

## 2018-07-30 MED ORDER — DICLOFENAC SODIUM 1 % TD GEL: 2.0000 g | Freq: Four times a day (QID) | TRANSDERMAL | 5 refills | Status: DC

## 2018-07-30 NOTE — Assessment & Plan Note (Signed)
stable overall by history and exam, recent data reviewed with pt, and pt to continue medical treatment as before,  to f/u any worsening symptoms or concerns  

## 2018-07-30 NOTE — Assessment & Plan Note (Signed)

## 2018-07-30 NOTE — Progress Notes (Signed)
Subjective:    Patient ID: Justin Walters, male    DOB: 1940/08/17, 78 y.o.   MRN: [id number]  HPI  Here for wellness and f/u;  Overall doing ok;  Pt denies Chest pain, worsening SOB, DOE, wheezing, orthopnea, PND, worsening LE edema, palpitations, dizziness or syncope.  Pt denies neurological change such as new headache, facial or extremity weakness.  Pt denies polydipsia, polyuria, or low sugar symptoms. Pt states overall good compliance with treatment and medications, good tolerability, and has been trying to follow appropriate diet.  Pt denies worsening depressive symptoms, suicidal ideation or panic. No fever, night sweats, wt loss, loss of appetite, or other constitutional symptoms.  Pt states good ability with ADL's, has low fall risk, home safety reviewed and adequate, no other significant changes in hearing or vision, and only occasionally active with exercise.BP at home similar to today.  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 Past Medical History:  Diagnosis Date  . At risk for sleep apnea    STOP-BANG= 5    SENT TO PCP 06-13-2013  . Bladder stone   . BPH (benign prostatic hyperplasia)   . COPD (chronic obstructive pulmonary disease) ([place])   . Diverticulosis of colon   . Dysuria   . Glucose intolerance (impaired glucose tolerance)   . History of kidney stones   . Hyperlipidemia   . Hypertension   . Ureteral disorder 2018   ureteral repair   Past Surgical History:  Procedure Laterality Date  . APPENDECTOMY  1980's  . CATARACT EXTRACTION W/ INTRAOCULAR LENS  IMPLANT, BILATERAL    . COLONOSCOPY  10-05-2003   tics only   . CYSTOSCOPY N/A 06/23/2013   Procedure: CYSTOSCOPY WITH LITHOLAPAXY, BLADDER EXPLORATION, REPAIR BLADDER PERFORATION;  Surgeon: Arvil Persons, MD;  Location: [facility];  Service: Urology;  Laterality: N/A;  .  CYSTOSCOPY/URETEROSCOPY/HOLMIUM LASER/STENT PLACEMENT Left 04/03/2017   Procedure: CYSTOSCOPY LEFT RETROGRADE/LEFT /URETEROSCOPY/HOLMIUM LASER/BASKET STONE EXTRACTION/STENT PLACEMENT/ URETHRAL BIOPSY;  Surgeon: [name], MD;  Location: [facility];  Service: Urology;  Laterality: Left;  . EXTRACORPOREAL SHOCK WAVE LITHOTRIPSY Left 10/23/2016   Procedure: LEFT EXTRACORPOREAL SHOCK WAVE LITHOTRIPSY (ESWL);  Surgeon: Nickie Retort, MD;  Location: [facility];  Service: Urology;  Laterality: Left;  . EXTRACORPOREAL SHOCK WAVE LITHOTRIPSY Left 01/08/2017   Procedure: LEFT EXTRACORPOREAL SHOCK WAVE LITHOTRIPSY (ESWL);  Surgeon: [name], MD;  Location: [facility];  Service: Urology;  Laterality: Left;  . HOLMIUM LASER APPLICATION N/A 3/73/4287   Procedure: HOLMIUM LASER APPLICATION;  Surgeon: Arvil Persons, MD;  Location: [facility];  Service: Urology;  Laterality: N/A;  . LAPAROSCOPIC INGUINAL HERNIA REPAIR Bilateral 10-09-2003  . TRANSURETHRAL RESECTION OF PROSTATE N/A 06/23/2013   Procedure: TRANSURETHRAL RESECTION OF THE PROSTATE;  Surgeon: Arvil Persons, MD;  Location: [facility];  Service: Urology;  Laterality: N/A;    reports that he quit smoking about 25 years ago. His smoking use included cigarettes. He has a 102.00 pack-year smoking history. He has never used smokeless tobacco. He reports that he does not drink alcohol or use drugs. family history includes COPD in his mother; Cancer in his father; Emphysema in his brother; Heart failure in his brother. No Known Allergies Current Outpatient Medications on File Prior to Visit  Medication Sig Dispense Refill  . acetaminophen (TYLENOL) 325 MG tablet Take  650 mg by mouth every 6 (six) hours as needed for mild pain or headache.    . albuterol (PROVENTIL HFA;VENTOLIN HFA) 108 (90 Base) MCG/ACT inhaler INHALE 2 PUFFS INTO THE LUNGS EVERY 6 (SIX) HOURS AS NEEDED FOR WHEEZING OR SHORTNESS OF BREATH. 54 g 3  . albuterol  (PROVENTIL) (2.5 MG/3ML) 0.083% nebulizer solution Take 3 mLs (2.5 mg total) by nebulization every 4 (four) hours as needed for wheezing or shortness of breath. 120 mL 11  . ANORO ELLIPTA 62.5-25 MCG/INH AEPB INHALE 1 PUFF BY MOUTH ONCE DAILY    . aspirin 81 MG EC tablet Take 1 tablet (81 mg total) by mouth daily. Swallow whole. 30 tablet 12  . gabapentin (NEURONTIN) 300 MG capsule TAKE 1 CAPSULE THREE TIMES DAILY 270 capsule 1  . Guaifenesin (MUCINEX MAXIMUM STRENGTH) 1200 MG TB12 Take 1 tablet by mouth 2 (two) times daily.    Marland Kitchen OVER THE COUNTER MEDICATION Take 2 capsules by mouth at bedtime. Vicks "Zzzs" capsule for sleep.    . pantoprazole (PROTONIX) 40 MG tablet TAKE 1 TABLET BY MOUTH ONCE DAILY .  TAKE  30-60  MINUTES  BEFORE  FIRST  MEAL  OF  THE  DAY 30 tablet 6  . Respiratory Therapy Supplies (FLUTTER) DEVI Use as directed 1 each 0  . rosuvastatin (CRESTOR) 20 MG tablet TAKE 1 TABLET EVERY DAY 90 tablet 2  . Saccharomyces boulardii (PROBIOTIC) 250 MG CAPS Take 1 capsule by mouth daily.    Marland Kitchen terazosin (HYTRIN) 10 MG capsule TAKE 1 CAPSULE AT BEDTIME (Patient taking differently: Take 10 mg by mouth at bedtime. ) 90 capsule 2   No current facility-administered medications on file prior to visit.    Review of Systems Constitutional: Negative for other unusual diaphoresis, sweats, appetite or weight changes HENT: Negative for other worsening hearing loss, ear pain, facial swelling, mouth sores or neck stiffness.   Eyes: Negative for other worsening pain, redness or other visual disturbance.  Respiratory: Negative for other stridor or swelling Cardiovascular: Negative for other palpitations or other chest pain  Gastrointestinal: Negative for worsening diarrhea or loose stools, blood in stool, distention or other pain Genitourinary: Negative for hematuria, flank pain or other change in urine volume.  Musculoskeletal: Negative for myalgias or other joint swelling.  Skin: Negative for other  color change, or other wound or worsening drainage.  Neurological: Negative for other syncope or numbness. Hematological: Negative for other adenopathy or swelling Psychiatric/Behavioral: Negative for hallucinations, other worsening agitation, SI, self-injury, or new decreased concentration ALl other system neg per pt    Objective:   Physical Exam .BP 108/64   Pulse 85   Temp 97.7 F (36.5 C) (Oral)   Ht 5\' 7"  (1.702 m)   Wt 172 lb (78 kg)   SpO2 94%   BMI 26.94 kg/m  VS noted,  Constitutional: Pt is oriented to person, place, and time. Appears well-developed and well-nourished, in no significant distress and comfortable Head: Normocephalic and atraumatic  Eyes: Conjunctivae and EOM are normal. Pupils are equal, round, and reactive to light Right Ear: External ear normal without discharge Left Ear: External ear normal without discharge Nose: Nose without discharge or deformity Mouth/Throat: Oropharynx is without other ulcerations and moist  Neck: Normal range of motion. Neck supple. No JVD present. No tracheal deviation present or significant neck LA or mass Cardiovascular: Normal rate, regular rhythm, normal heart sounds and intact distal pulses.   Pulmonary/Chest: WOB normal and breath sounds decresed without rales or  wheezing  Abdominal: Soft. Bowel sounds are normal. NT. No HSM  Musculoskeletal: Normal range of motion. Exhibits no edema., has severe OA changes of first 3 fingers both hand right > left without effusions but painful to flex Lymphadenopathy: Has no other cervical adenopathy.  Neurological: Pt is alert and oriented to person, place, and time. Pt has normal reflexes. No cranial nerve deficit. Motor grossly intact, Gait intact Skin: Skin is warm and dry. No rash noted or new ulcerations Psychiatric:  Has normal mood and affect. Behavior is normal without agitation No other exam findings Lab Results  Component Value Date   WBC 5.8 07/17/2018   HGB 12.7 (L)  07/17/2018   HCT 38.8 (L) 07/17/2018   PLT 205.0 07/17/2018   GLUCOSE 97 07/22/2018   CHOL 122 07/17/2018   TRIG 96.0 07/17/2018   HDL 38.90 (L) 07/17/2018   LDLDIRECT 161.9 03/28/2012   LDLCALC 64 07/17/2018   ALT 7 07/17/2018   AST 14 07/17/2018   NA 139 07/22/2018   K 4.4 07/22/2018   CL 105 07/22/2018   CREATININE 1.73 (H) 07/22/2018   BUN 19 07/22/2018   CO2 25 07/22/2018   TSH 2.91 07/17/2018   PSA 0.96 07/17/2018   HGBA1C 5.7 07/17/2018       Assessment & Plan:

## 2018-07-30 NOTE — Assessment & Plan Note (Signed)
No longer needs antiHTN meds

## 2018-07-30 NOTE — Patient Instructions (Addendum)
You had the Tdap tetanus shot today  Please take all new medication as prescribed - the gel for pain in the hand joints  Please continue all other medications as before, and refills have been done if requested.  Please have the pharmacy call with any other refills you may need.  Please continue your efforts at being more active, low cholesterol diet, and weight control.  You are otherwise up to date with prevention measures today.  Please keep your appointments with your specialists as you may have planned  Please return in 6 months, or sooner if needed, with Lab testing done 3-5 days before

## 2018-07-30 NOTE — Assessment & Plan Note (Addendum)
Mild to mod, for volt gel prn,  to f/u any worsening symptoms or concerns  In addition to the time spent performing CPE, I spent an additional 25 minutes face to face,in which greater than 50% of this time was spent in counseling and coordination of care for patient's acute illness as documented, including the differential dx, treatment, further evaluation and other management of hand OA, CKD, COPE, THn, HLD, hyperglycemia

## 2018-08-06 LAB — EXPECTORATED SPUTUM ASSESSMENT W GRAM STAIN, RFLX TO RESP C

## 2018-08-23 DIAGNOSIS — J9611 Chronic respiratory failure with hypoxia: Secondary | ICD-10-CM | POA: Diagnosis not present

## 2018-08-23 DIAGNOSIS — J449 Chronic obstructive pulmonary disease, unspecified: Secondary | ICD-10-CM | POA: Diagnosis not present

## 2018-08-30 DIAGNOSIS — J449 Chronic obstructive pulmonary disease, unspecified: Secondary | ICD-10-CM | POA: Diagnosis not present

## 2018-09-04 DIAGNOSIS — Z23 Encounter for immunization: Secondary | ICD-10-CM | POA: Diagnosis not present

## 2018-09-04 DIAGNOSIS — N400 Enlarged prostate without lower urinary tract symptoms: Secondary | ICD-10-CM | POA: Diagnosis not present

## 2018-09-04 DIAGNOSIS — I129 Hypertensive chronic kidney disease with stage 1 through stage 4 chronic kidney disease, or unspecified chronic kidney disease: Secondary | ICD-10-CM | POA: Diagnosis not present

## 2018-09-04 DIAGNOSIS — N183 Chronic kidney disease, stage 3 (moderate): Secondary | ICD-10-CM | POA: Diagnosis not present

## 2018-09-05 DIAGNOSIS — N390 Urinary tract infection, site not specified: Secondary | ICD-10-CM | POA: Diagnosis not present

## 2018-09-06 ENCOUNTER — Ambulatory Visit: Payer: Medicare HMO | Admitting: Internal Medicine

## 2018-09-10 ENCOUNTER — Other Ambulatory Visit: Payer: Self-pay

## 2018-09-10 ENCOUNTER — Encounter: Payer: Self-pay | Admitting: Internal Medicine

## 2018-09-10 ENCOUNTER — Ambulatory Visit (INDEPENDENT_AMBULATORY_CARE_PROVIDER_SITE_OTHER): Payer: Medicare HMO | Admitting: Internal Medicine

## 2018-09-10 DIAGNOSIS — J449 Chronic obstructive pulmonary disease, unspecified: Secondary | ICD-10-CM

## 2018-09-10 NOTE — Progress Notes (Signed)
Subjective:     Patient ID: Justin Walters, male   DOB: 10-20-1940,     MRN: [id number]    Brief patient profile:  5 yowm MM quit smoking 1995 with cough/wheeze/ sob and never recovered with documented GOLD III copd 02/23/2017      History of Present Illness  01/25/2017 1st  office visit/    GOLD III with reversibility  Chief Complaint  Patient presents with  . Pulmonary Consult    Referred by the hospital. He c/o SOB and coughing for the past several wks. His cough is occ prod, but he is unsure of sputum color. He is SOB with exertion and also when he lies down.  He gets winded walking approx 100 yards. He is using his albuterol inhaler 3 x per wk on average.   baseline doe across the street to son's house  nl pace and then onset   x one year gradually worse doe and no better on inhalers but poor baseline hfa/ assoc with cough / sense of chest congestion and has to prop up at 30 degrees now at hs to get comfortable Present doe = MMRC3 = can't walk 100 yards even at a slow pace at a flat grade s stopping due to sob  Easily confused with details of care/ names of meds / not using spiriva/ was better on pred and worse since tapered rec Plan A = Automatic =  symbicort 160 Take 2 puffs first thing in am and then another 2 puffs about 12 hours later.  Work on inhaler technique:  Plan B = Backup Only use your albuterol  as a rescue  Prednisone 10 mg take  4 each am x 2 days,   2 each am x 2 days,  1 each am x 2 days and stop     02/23/2017  f/u ov/ re:  GOLD III copd / no 02 Chief Complaint  Patient presents with  . Follow-up    PFT's today.  Breathing has improved some. He is rarely using his albuterol inhaler. He has been feeling fatigued.    better p prednisone then gradually worse  Dyspnea:  MMRC2 = can't walk a nl pace on a flat grade s sob but does fine slow and flat  Cough: none Sleep: ok 30 degrees no am congestion rec Add tudorza one puff after symbicort in am and in pm   Work on inhaler technique:      05/24/2017  f/u ov/ re:  Copd III/ no 02  Just on maint symb 160 / could not afford tudorza and not sure it helped doe  Chief Complaint  Patient presents with  . Follow-up    Breathing is doing well. He rarely uses his albuterol inhaler. No new co's.   Dyspnea:  MMRC2 = can't walk a nl pace on a flat grade s sob but does fine slow pace pretty much anywhere he wants to go  Cough: none Sleep: ok flat SABA use:  Rare  Hb every hs x sev months / resolves p about an hour, better p sit up / no assoc dysphagia or daytime symptoms/ varies some with how early he eats supper and how much, not sure what type meals make it worse  rec GERD diet  If not better add pepcid ac 20 mg one hour before bed > did not  Do   10/31/17 NP eval for 3-4 weeks worse sob/ cough rec Xopenex in office  Depo 80 Prednisone 10mg  tablet  >>>  4 tabs for 2 days, then 3 tabs for 2 days, 2 tabs for 2 days, then 1 tab for 2 days, then sto Azithromycin 250mg  tablet  >>>Take 2 tablets (500mg  total) today, and then 1 tablet (250mg ) for the next four days      11/21/2017 acute extended ov/ re: refractory cough x early oct 2019  / no change sob  Chief Complaint  Patient presents with  . Acute Visit    pt c/o sinus congestion, pnd, prod cough with white mucus X1 month.  has had 2 rounds of pred and doxy with little relief.   dyspnea : MMRC2 = can't walk a nl pace on a flat grade s sob but does fine slow and flat= baseline when not coughing  Cough > white mucus  not worse in am's/ harsh daytime coughing fts Sleeping flat two pillow saba  Not at all now rec Plan A = Automatic = symbicort 160 Take 2 puffs first thing in am and then another 2 puffs about 12 hours later.  Whenever your cough flares > try  Try prilosec otc 20mg   Take 30-60 min before first meal of the day and Pepcid ac (famotidine) 20 mg one after hour after supper or at bedtime until cough is completely gone for at  least a week without the need for cough suppression Plan B = Backup Only use your albuterol inhaler as a rescue medication  goes up over your usual need - Don't leave home without it !!  (think of it like the spare tire for your car)  For cough Mucinex dm 1200 mg every 12 hours and the tylenol #3  Up to every 4 hours if need for persistent irritating coughing  GERD diet      12/05/2017 acute extended ov/ re: cough and sob worse since oc 2019 Chief Complaint  Patient presents with  . Acute Visit    Increased SOB, cough and chest congestion x 1 month. Sputum is white to yellow. He is using his albuterol inhaler 2 x per wk on average.   cough is worse p supper and fine on back with  flat bed/ no blocks  In am feels fine but groggy p benadryl - can't  sleep s it W/in 20 min of rising lots of pnds > white mucus  Not using mucinex dm / reversing ppi and h2  Technique on symb poor and not using saba correctly in terms or when to use  rec Augmentin 875 mg take one pill twice daily  X 10 days - take at breakfast and supper with large glass of water.  .  Take prednisone  4 for three days 3 for three days 2 for three days 1 for three days and stop  Omeprazole 20 mg Take 30- 60 min before your first and last meals of the day and continue famotidine 20 mg at bedtime Bed blocks would be a great idea Try off zquil and tylenol #3  Up to 2 at bedtime For cough > mucinex dm 1200 mg every 12 hours and use the flutter valve as much as possible and if still can't stop coughing then use the tylenol #3        12/20/2017  f/u ov/ re:  Cough better, doe not quite baseline on symb 160 2bid  No worse off pred Chief Complaint  Patient presents with  . Follow-up    Breathing has improved some but is not back to his normal baseline. He rarely uses  his albuterol inhaler.   Dyspnea:  MMRC2 = can't walk a nl pace on a flat grade s sob but does fine slow and flat  Cough: better/ no mucus production at all   Sleeping: ok flat  SABA use: rarely 02: none   rec Bed blocks 6-8 inches  For cough > Mucinex dm up to 1200 mg twice daily and use the flutter valve as much as you can Gabapentin should be three times daily  Omeprazole is Take 30-60 min before first meal of the day and pepcid ac 20 mg after supper     01/25/2018 acute extended ov/ re:  Copd III / stopped symb too expensive, does better with nebs / overall not doing well since early Oct 2019  Chief Complaint  Patient presents with  . Acute Visit    SOB, runny nose, ear pain, wheezing, cough- white mucus at times, denies fever   brought meds/ not using ppi as instructed and ? If really using flutter, doesn't think it helps but did not bring it as rec to show him how to use/ using halls cough drops with menthol which was discouraged last ov/ gradually worse doe/ subj wheeze x sev weeks no better with hfa but technique poor/ no purulent mucus/ cough worse at hs and in am. No better on prednisone  rec Duoneb (albuterol and atrovent)  4 x daily Only use your albuterol as a rescue medication   Work on inhaler technique:  Be sure you take prilosec Take 30- 60 min before your first and last meals of the day  Change your appt to  - add sinus ct / hrct chest if still coughing on return      02/22/2018  f/u ov/ re: COPD  GOLD III maint on duoneb tid  Chief Complaint  Patient presents with  . Follow-up    Increased SOB, wheezing, and cough- non over the past 3-4 wks. He is using his albuterol inhaler 3-4 x per day and neb 3 x per day.   Dyspnea:  Still does [employer] with hc parking no 02  Cough: p supper is the worst but never prod Sleeping: bed blocks/ wakes up tight in chest around 2 am p last neb at 7 pm  SABA use: as above  rec Plan A = Automatic = Symbicort 160 Take 2 puffs first thing in am and then another 2 puffs about 12 hours later and duoneb four times   Work on inhaler technique:   Plan B = Backup Only use your  albuterol inhaler as a rescue medication Prednisone 10 mg  Take 4 for three days 3 for three days 2 for three days 1 for three days and stop  Please see patient coordinator before you leave today  to schedule 02 2lpm 24/7 for now Please remember to go to the  x-ray department  for your tests - we will call you with the results when they are available   Please schedule a follow up office visit in 2 weeks, sooner if needed  with all medications /inhalers/ solutions in hand so we can verify exactly what you are taking. This includes all medications from all doctors and over the counters and bring your DRUG FORMULARY  - consider change coreg to bisoprolol or at least bystolic samples short run  - add levaquin 500 mg x 10 d for ? pna    03/08/2018  f/u ov/ re:  COPD  III  On symb and duoneb qid  Chief Complaint  Patient presents with  . Follow-up    Breathing has improved back to baseline. He is not using his albuterol inhaler but is using his duoneb 4 x daily.   Dyspnea:  Better, usually does [employer] leaning on cart s 02  Cough: none now Sleeping: bed blocks  SABA use: none extra in hfa form  02: 2lpm at hs not with activity  rec Plan A = Automatic = Incruse/BREO  one click eam am and continue 02 2 lpm at bedtime and wear it  as needed during the day to keep your sats > 90% with walking or go slower Plan B = Backup Only use your albuterol inhaler as a rescue medication  Plan C = Crisis - only use your albuterol nebulizer(not your ipatropium)  if you first try Plan B and it fails to help > ok to use the nebulizer up to every 4 hours but if start needing it regularly call for immediate appointment Please remember to go to the  x-ray department  for your tests - we will call you with the results when they are available   Please schedule a follow up office visit in 2 weeks with your formulary , sooner if needed  with all medications /inhalers/ solutions in hand so we can verify exactly what  you are taking. This includes all medications from all doctors and over the counters (ok to see NP if I don't have opening )    03/25/2018  f/u ov/ re:  COPD III overall worse since Oct 2019, esp since Feb 02 2018 Chief Complaint  Patient presents with  . Follow-up    Breathing has been progressively worse since the last visit. He is using his rescue inhaler daily and neb 4 x daily.   Dyspnea:  Across the room on 3lpm  Cough: turned green x sev days prior to OV / sometimes coughs so hard hurts in chest (ant/ generalized)  but not using flutter as rec   Sleeping: bed blocks does fine most nocts SABA use: as above  02:  3lpm 24/7  rec Change BREO / Incruse to Anoro click x one and take two good drags (hold with the R hand) Prednisone 10 mg take  4 each am x 2 days,   2 each am x 2 days,  1 each am x 2 days and stop  Augmentin 875 mg take one pill twice daily  X 10 days -  Plan B = Backup Only use your albuterol inhaler as a rescue medication Plan C = Crisis - only use your albuterol nebulizer(NOT your ipatropium/albuterol)  if you first try Plan B Add protonix 40 mg Take 30-60 min before first meal of the day  The goal with 02 is to keep your 02 saturations over 90% at all times   Admit date: 03/26/2018 Discharge date: 03/28/2018  Admitted From: Home Disposition: Home  Brief/Interim Summary:78 y.o.malewith a history of3L O2-dependent COPD , HTN, HLD, nephrolithiasis s/p ESWL 2019, BPH with LUTS who presented with acute dyspnea and wheezing. He reports 1-2 weeks of increased, intermittent, moderate wheezing improved with home nebulizer treatments, as well as slight increase in his chronic cough in that it is more productive, reported blue-green.Was evaluated yesterday by pulmonology, CXR showed infiltrate, started on augmentin and prednisone which he hasn't taken yet.He lives with his daughter who works in [employer] and has continued working, but isn't ill.  ED  Course:Initially in significant respiratory distress requiring BiPAP, given  nebs, solumedrol. Temp 100.73F, tachypneic, tachycardic. WBC 123XX123 with neutrophilic predominance, lymphs 1.1k, creatinine 1.31, platelets 577, hgb 11. Flu negative, covid assay sent.Respiratory effort has improved, but has not returned to baseline, so admission was requested.  Review of Systems:He denies fever, chills, has some chest tightness when coughing only. No leg swelling, orthopnea, PND. He denies abdominal pain, dysuria, or change in urinary symptoms but does have LUTS from BPH which are stable. Otherwiseper HPI. All others reviewed and are negative.     Discharge Diagnoses:  Principal Problem:   Acute on chronic respiratory failure with hypoxia and hypercapnia (HCC) Active Problems:   Hyperlipidemia   Essential hypertension   CKD (chronic kidney disease), stage III (HCC)   BPH (benign prostatic hyperplasia)   RENAL CALCULUS, HX OF   COPD exacerbation (HCC)   GERD (gastroesophageal reflux disease)   #1 acute on chronic hypoxic hypercapnic respiratory failure secondary to COPD exacerbation and possible pneumonia viral versus bacterial. Patient is on 3 L of oxygen prior to admission to the hospital. He lives at home with his daughter. His daughter works in [employer] and is continuing to work.  Family is able to quarantine him at home for 14 days or till COVID-19 results come back.  I will discharge him on Augmentin for 4 more days along with prednisone taper.  He was MRSA PCR negative.  Continue MDI as he was doing at home.. Chest x-ray 23 2020 shows hyperexpanded lungs with patchy airspace opacity at the bases likely due to pneumonia, less coarse interstitial thickening and patchy airspace opacity in the upper and mid lungs. Reticular interstitial disease, possible fibrosis. Strep pneumo negative. Covid 19 neg pcr  #2 renal stones with pyuria patient asymptomatic.  #3 GERD continue  Pepcid  #4 hypertension risk and Coreg were stopped due to soft blood pressure this may be restarted as an outpatient anytime as appropriate.    06/06/2018  f/u ov/ re: post hosp off norvasc/ coreg(low bp) and much better since d/c and still on Anoro  Chief Complaint  Patient presents with  . Follow-up    Patient states that he's doing better. He has not had to use his rescue inhaler since last visit.   Dyspnea:  300 yards s stopping to son's house s 02 -  does not check sats Cough: minimal non productive Sleeping: on blocks x 6 in  SABA use: none  02: not using  rec Monitor your 02 saturations toward the end of your activity and try to build it back up to 30 min target with 02 sats over 90% is the target  No change in inhalers    09/10/2018  f/u ov/ re: GOLD III copd maint rx anoro  Chief Complaint  Patient presents with  . Follow-up    Breathing is doing well. He is coughing a little more than usual- sputum is white. He rarely uses albuterol inhaler or neb.    Dyspnea:  MMRC2 = can't walk a nl pace on a flat grade s sob but does fine slow and flat walking to son's house s stopping Cough: minimal sporadic mucoid not noct or flaring in am  Sleeping: 6 in blocks SABA use: never or rarely ever hfa, never neb 02: has it not using    No obvious day to day or daytime variability or assoc excess/ purulent sputum or mucus plugs or hemoptysis or cp or chest tightness, subjective wheeze or overt sinus or hb symptoms.   Sleeping  without nocturnal  or early am exacerbation  of respiratory  c/o's or need for noct saba. Also denies any obvious fluctuation of symptoms with weather or environmental changes or other aggravating or alleviating factors except as outlined above   No unusual exposure hx or h/o childhood pna/ asthma or knowledge of premature birth.  Current Allergies, Complete Past Medical History, Past Surgical History, Family History, and Social History were reviewed in  [facility] record.  ROS  The following are not active complaints unless bolded Hoarseness, sore throat, dysphagia, dental problems, itching, sneezing,  nasal congestion or discharge of excess mucus or purulent secretions, ear ache,   fever, chills, sweats, unintended wt loss or wt gain, classically pleuritic or exertional cp,  orthopnea pnd or arm/hand swelling  or leg swelling, presyncope, palpitations, abdominal pain, anorexia, nausea, vomiting, diarrhea  or change in bowel habits or change in bladder habits, change in stools or change in urine, dysuria, hematuria,  rash, arthralgias, visual complaints, headache, numbness, weakness or ataxia or problems with walking or coordination,  change in mood or  memory.        Current Meds  Medication Sig  . acetaminophen (TYLENOL) 325 MG tablet Take 650 mg by mouth every 6 (six) hours as needed for mild pain or headache.  . albuterol (PROVENTIL HFA;VENTOLIN HFA) 108 (90 Base) MCG/ACT inhaler INHALE 2 PUFFS INTO THE LUNGS EVERY 6 (SIX) HOURS AS NEEDED FOR WHEEZING OR SHORTNESS OF BREATH.  Marland Kitchen albuterol (PROVENTIL) (2.5 MG/3ML) 0.083% nebulizer solution Take 3 mLs (2.5 mg total) by nebulization every 4 (four) hours as needed for wheezing or shortness of breath.  Jearl Klinefelter ELLIPTA 62.5-25 MCG/INH AEPB INHALE 1 PUFF BY MOUTH ONCE DAILY  . aspirin 81 MG EC tablet Take 1 tablet (81 mg total) by mouth daily. Swallow whole.  . diclofenac sodium (VOLTAREN) 1 % GEL Apply 2 g topically 4 (four) times daily.  . famotidine (PEPCID) 20 MG tablet Take 20 mg by mouth at bedtime.  . gabapentin (NEURONTIN) 300 MG capsule TAKE 1 CAPSULE THREE TIMES DAILY  . Guaifenesin (MUCINEX MAXIMUM STRENGTH) 1200 MG TB12 Take 1 tablet by mouth 2 (two) times daily.  Marland Kitchen OVER THE COUNTER MEDICATION Take 2 capsules by mouth at bedtime. Vicks "Zzzs" capsule for sleep.  . pantoprazole (PROTONIX) 40 MG tablet TAKE 1 TABLET BY MOUTH ONCE DAILY .  TAKE  30-60  MINUTES  BEFORE   FIRST  MEAL  OF  THE  DAY  . Respiratory Therapy Supplies (FLUTTER) DEVI Use as directed  . rosuvastatin (CRESTOR) 20 MG tablet TAKE 1 TABLET EVERY DAY  . tamsulosin (FLOMAX) 0.4 MG CAPS capsule Take 0.4 mg by mouth daily.             Objective:   Physical Exam  amb wm nad   09/10/2018          174  06/06/2018          171  03/25/2018       155  03/08/2018         149   01/25/2018       156  12/05/2017       157  11/21/2017     157  05/24/2017        164    01/25/17 150 lb 12.8 oz (68.4 kg)  01/18/17 150 lb (68 kg)  01/08/17 150 lb (68 kg)     Vital signs reviewed - Note on arrival 02 sats  97% on RA  Has Upper partial plate    HEENT : pt wearing mask not removed for exam due to covid - 19 concerns.  LUNGS: no acc muscle use,  Mod barrel  contour chest wall with bilateral  Distant bs s audible wheeze and  without cough on insp or exp maneuvers and mod  Hyperresonant  to  percussion bilaterally     CV:  RRR  no s3 or murmur or increase in P2, and no edema   ABD:  soft and nontender with pos mid insp Hoover's  in the supine position. No bruits or organomegaly appreciated, bowel sounds nl  MS:     ext warm without deformities, calf tenderness, cyanosis or clubbing No obvious joint restrictions   SKIN: warm and dry without lesions    NEURO:  alert, approp, nl sensorium with  no motor or cerebellar deficits apparent.            Assessment:

## 2018-09-10 NOTE — Patient Instructions (Signed)
No change in medications    Please schedule a follow up visit in 6  months but call sooner if needed  

## 2018-09-11 ENCOUNTER — Encounter: Payer: Self-pay | Admitting: Internal Medicine

## 2018-09-11 NOTE — Assessment & Plan Note (Signed)
Quit smoking 1995 PFT's  01/20/2017  FEV1 1.05 (39 % ) ratio 54  p 18 % improvement from saba p ? prior to study with DLCO  50/53  % corrects to 77 % for alv volume  - 01/25/2017   rec increase symb to 160 2bid trial  - alpha one AT screening  01/18/17   MM  Level 190 PFT's  02/23/2017  FEV1 1.17 (44 % ) ratio 54  p 0 % improvement from saba p symb 160 prior to study with DLCO   39 % corrects to 59 % for alv volume  - 02/23/2017    add tudorza trial > no change and could not afford - 12/05/2017 flutter valve added   - 02/22/2018    > restart symb 160 2bid - 03/08/2018  After extensive coaching inhaler device,  effectiveness =    90% with elipta so try Anoro daily > improved as of 06/06/2018   Pt is Group B in terms of symptom/risk and laba/lama therefore appropriate rx at this point >>>  anoro appropriate   He is having urinary obst symptoms but says these are no worse since started anoro so no need to change rx but if becomes more problematic could consider tudorza as the lama since has less systemic anti-cholinergic effects  I had an extended discussion with the patient reviewing all relevant studies completed to date and  lasting 15 to 20 minutes of a 25 minute visit    Visit extended by teaching optimal use of devices, both DPI and flutter valve optimal techniques.   Each maintenance medication was reviewed in detail including most importantly the difference between maintenance and prns and under what circumstances the prns are to be triggered using an action plan format that is not reflected in the computer generated alphabetically organized AVS.     Please see AVS for specific instructions unique to this visit that I personally wrote and verbalized to the the pt in detail and then reviewed with pt  by my nurse highlighting any  changes in therapy recommended at today's visit to their plan of care.

## 2018-09-23 DIAGNOSIS — J9611 Chronic respiratory failure with hypoxia: Secondary | ICD-10-CM | POA: Diagnosis not present

## 2018-09-23 DIAGNOSIS — J449 Chronic obstructive pulmonary disease, unspecified: Secondary | ICD-10-CM | POA: Diagnosis not present

## 2018-09-30 DIAGNOSIS — J449 Chronic obstructive pulmonary disease, unspecified: Secondary | ICD-10-CM | POA: Diagnosis not present

## 2018-10-03 DIAGNOSIS — N390 Urinary tract infection, site not specified: Secondary | ICD-10-CM | POA: Diagnosis not present

## 2018-10-03 DIAGNOSIS — I129 Hypertensive chronic kidney disease with stage 1 through stage 4 chronic kidney disease, or unspecified chronic kidney disease: Secondary | ICD-10-CM | POA: Diagnosis not present

## 2018-10-03 DIAGNOSIS — N200 Calculus of kidney: Secondary | ICD-10-CM | POA: Diagnosis not present

## 2018-10-03 DIAGNOSIS — N400 Enlarged prostate without lower urinary tract symptoms: Secondary | ICD-10-CM | POA: Diagnosis not present

## 2018-10-03 DIAGNOSIS — N183 Chronic kidney disease, stage 3 unspecified: Secondary | ICD-10-CM | POA: Diagnosis not present

## 2018-10-18 ENCOUNTER — Other Ambulatory Visit: Payer: Self-pay | Admitting: Internal Medicine

## 2018-10-18 DIAGNOSIS — R918 Other nonspecific abnormal finding of lung field: Secondary | ICD-10-CM

## 2018-10-18 MED ORDER — PANTOPRAZOLE SODIUM 40 MG PO TBEC: DELAYED_RELEASE_TABLET | ORAL | 1 refills | Status: DC

## 2018-10-23 DIAGNOSIS — J9611 Chronic respiratory failure with hypoxia: Secondary | ICD-10-CM | POA: Diagnosis not present

## 2018-10-23 DIAGNOSIS — J449 Chronic obstructive pulmonary disease, unspecified: Secondary | ICD-10-CM | POA: Diagnosis not present

## 2018-10-25 ENCOUNTER — Other Ambulatory Visit: Payer: Self-pay | Admitting: Internal Medicine

## 2018-10-30 DIAGNOSIS — J449 Chronic obstructive pulmonary disease, unspecified: Secondary | ICD-10-CM | POA: Diagnosis not present

## 2018-11-06 ENCOUNTER — Other Ambulatory Visit: Payer: Self-pay | Admitting: Internal Medicine

## 2018-11-14 ENCOUNTER — Telehealth: Payer: Self-pay | Admitting: Internal Medicine

## 2018-11-14 ENCOUNTER — Ambulatory Visit (INDEPENDENT_AMBULATORY_CARE_PROVIDER_SITE_OTHER): Payer: Medicare HMO | Admitting: Pulmonary Disease

## 2018-11-14 DIAGNOSIS — J9611 Chronic respiratory failure with hypoxia: Secondary | ICD-10-CM

## 2018-11-14 DIAGNOSIS — J441 Chronic obstructive pulmonary disease with (acute) exacerbation: Secondary | ICD-10-CM | POA: Diagnosis not present

## 2018-11-14 DIAGNOSIS — J449 Chronic obstructive pulmonary disease, unspecified: Secondary | ICD-10-CM | POA: Diagnosis not present

## 2018-11-14 MED ORDER — AZITHROMYCIN 250 MG PO TABS: ORAL_TABLET | ORAL | 0 refills | Status: DC

## 2018-11-14 MED ORDER — PREDNISONE 10 MG PO TABS: ORAL_TABLET | ORAL | 0 refills | Status: DC

## 2018-11-14 NOTE — Assessment & Plan Note (Signed)
Likely COPD exacerbation today  Plan: Azithromycin sent into the pharmacy I encourage the patient to start using his albuterol nebulized meds 1-2 times daily to help with shortness of breath as well as if he starts to notice wheezing If the wheezing persists despite albuterol nebulized meds use

## 2018-11-14 NOTE — Telephone Encounter (Signed)
Called spoke with patient to discuss televisit/video visit.  Patient unable to do video visit because even though he has signed up for MyChart, he is unsure how to navigate it and does not remember his password.  Patient declined assistance with this.  Televisit scheduled with Warner Mccreedy NP for 1330 this afternoon.  Nothing further needed at this time; will sign off.

## 2018-11-14 NOTE — Assessment & Plan Note (Signed)
Plan: Z-Pak sent to pharmacy today Prednisone sent to pharmacy for patient to utilize if wheezing worsens Continue Symbicort 160 We will schedule follow-up in our office in 2 to 4 weeks to ensure symptoms are improving Start albuterol neb use 1-2 times daily to help with current acute symptoms

## 2018-11-14 NOTE — Progress Notes (Signed)
Virtual Visit via Telephone Note  I connected with Stevphen Meuse on 11/14/18 at  1:30 PM EST by telephone and verified that I am speaking with the correct person using two identifiers.  Location: Patient: Home Provider: [facility] - [id number], [place], [place], [place] [address]   I discussed the limitations, risks, security and privacy concerns of performing an evaluation and management service by telephone and the availability of in person appointments. I also discussed with the patient that there may be a patient responsible charge related to this service. The patient expressed understanding and agreed to proceed.  Patient consented to consult via telephone: Yes People present and their role in pt care: Pt   History of Present Illness:  78 year old male former smoker followed in our office for gold 3 COPD  PMH: gerd, htn Smoker/ Smoking History: Former smoker.  102 pack years. Maintenance:  Symbicort 160 Pt of: Dr. Melvyn Novas  Chief complaint: Cough, Chest Congestion   78 year old male former smoker contacted our office on 11/14/2018 reporting increased cough and congestion.  Patient has concerns as previously he has been hospitalized for worsened symptoms when it went too long.  Patient denies any fevers.  He reports the cough is productive but he swallows his mucus he is unsure what color it is.  He remains adherent to his Symbicort 160 inhaler.  He has not had to use his rescue inhaler.  Patient reports he does have a nebulizer at home that he has not started using.  He is noticed that the congestion continues to increase.  Patient has not had any recent antibiotics.  Observations/Objective:  Patient has calm tone somewhat fatigued over phone Occasional cough that sounds wet  02/23/2017 but it is-pulmonary function test-FVC 2.13 (57% predicted), postbronchodilator ratio 54, postbronchodilator FEV1 1.17 (44% predicted), no bronchodilator response, DLCO 11.28 (39%  predicted)  Assessment and Plan:  Chronic respiratory failure with hypoxia (HCC) Not able to check his oxygen level at home We will continue to monitor this clinically  COPD exacerbation (HCC) Likely COPD exacerbation today  Plan: Azithromycin sent into the pharmacy I encourage the patient to start using his albuterol nebulized meds 1-2 times daily to help with shortness of breath as well as if he starts to notice wheezing If the wheezing persists despite albuterol nebulized meds use  COPD GOLD III Plan: Z-Pak sent to pharmacy today Prednisone sent to pharmacy for patient to utilize if wheezing worsens Continue Symbicort 160 We will schedule follow-up in our office in 2 to 4 weeks to ensure symptoms are improving Start albuterol neb use 1-2 times daily to help with current acute symptoms    Follow Up Instructions:  Return in about 4 weeks (around 12/12/2018), or if symptoms worsen or fail to improve, for Follow up with Dr. Melvyn Novas, Follow up with Wyn Quaker FNP-C.   I discussed the assessment and treatment plan with the patient. The patient was provided an opportunity to ask questions and all were answered. The patient agreed with the plan and demonstrated an understanding of the instructions.   The patient was advised to call back or seek an in-person evaluation if the symptoms worsen or if the condition fails to improve as anticipated.  I provided 23 minutes of non-face-to-face time during this encounter.   Lauraine Rinne, NP

## 2018-11-14 NOTE — Assessment & Plan Note (Signed)
Not able to check his oxygen level at home We will continue to monitor this clinically

## 2018-11-14 NOTE — Telephone Encounter (Signed)
Per TP: with Mack NP or Parrett NP, thank you. 

## 2018-11-14 NOTE — Telephone Encounter (Signed)
Please make televisit with Warner Mccreedy NP for further evaluation   Please contact office for sooner follow up if symptoms do not improve or worsen or seek emergency care

## 2018-11-14 NOTE — Telephone Encounter (Signed)
Spoke with pt. States that he has developed chest congestion and a cough. Cough is non productive at this time. Denies fever/chills, congestion/runny nose, sore throat, severe headache, joint pain, unexplained muscle aches, increased shortness of breath, loss of taste or smell, rash, N/V/D, abdominal pain, redness around/in the eye, increased weakness or unexplained bruising or bleeding.  Symptoms started yesterday. Stated, "This happened last year and I ended up in the hospital." Pt has been using his albuterol HFA with minimal relief. Would like recommendations.  Tammy - please advise. Thanks.

## 2018-11-14 NOTE — Patient Instructions (Signed)
You were seen today by Lauraine Rinne, NP  for:   1. COPD GOLD III  - azithromycin (ZITHROMAX) 250 MG tablet; 500mg  (two tablets) today, then 250mg  (1 tablet) for the next 4 days  Dispense: 6 tablet; Refill: 0 - predniSONE (DELTASONE) 10 MG tablet; Take 2 tablets (20mg  total) daily for the next 5 days. Take in the AM with food.  Dispense: 10 tablet; Refill: 0  Continue Symbicort 160 >>> 2 puffs in the morning right when you wake up, rinse out your mouth after use, 12 hours later 2 puffs, rinse after use >>> Take this daily, no matter what >>> This is not a rescue inhaler   Only use your albuterol as a rescue medication to be used if you can't catch your breath by resting or doing a relaxed purse lip breathing pattern.  - The less you use it, the better it will work when you need it. - Ok to use up to 2 puffs  every 4 hours if you must but call for immediate appointment if use goes up over your usual need - Don't leave home without it !!  (think of it like the spare tire for your car)   Continue use albuterol nebulized meds 1-2 times daily to help with shortness of breath or wheezing  Can utilize prednisone which was sent to the pharmacy if wheezing persists  We will have close follow-up with you in our office  Note your daily symptoms > remember "red flags" for COPD:   >>>Increase in cough >>>increase in sputum production >>>increase in shortness of breath or activity  intolerance.   If you notice these symptoms, please call the office to be seen.    2. COPD exacerbation (HCC)  - azithromycin (ZITHROMAX) 250 MG tablet; 500mg  (two tablets) today, then 250mg  (1 tablet) for the next 4 days  Dispense: 6 tablet; Refill: 0 - predniSONE (DELTASONE) 10 MG tablet; Take 2 tablets (20mg  total) daily for the next 5 days. Take in the AM with food.  Dispense: 10 tablet; Refill: 0  3. Chronic respiratory failure with hypoxia (HCC)    We recommend today:  No orders of the defined types were  placed in this encounter.  No orders of the defined types were placed in this encounter.  Meds ordered this encounter  Medications  . azithromycin (ZITHROMAX) 250 MG tablet    Sig: 500mg  (two tablets) today, then 250mg  (1 tablet) for the next 4 days    Dispense:  6 tablet    Refill:  0  . predniSONE (DELTASONE) 10 MG tablet    Sig: Take 2 tablets (20mg  total) daily for the next 5 days. Take in the AM with food.    Dispense:  10 tablet    Refill:  0    Follow Up:    Return in about 4 weeks (around 12/12/2018), or if symptoms worsen or fail to improve, for Follow up with Dr. Melvyn Novas, Follow up with Wyn Quaker FNP-C.   Please do your part to reduce the spread of COVID-19:      Reduce your risk of any infection  and COVID19 by using the similar precautions used for avoiding the common cold or flu:  Marland Kitchen Wash your hands often with soap and warm water for at least 20 seconds.  If soap and water are not readily available, use an alcohol-based hand sanitizer with at least 60% alcohol.  . If coughing or sneezing, cover your mouth and nose by coughing or  sneezing into the elbow areas of your shirt or coat, into a tissue or into your sleeve (not your hands). Langley Gauss A MASK when in public  . Avoid shaking hands with others and consider head nods or verbal greetings only. . Avoid touching your eyes, nose, or mouth with unwashed hands.  . Avoid close contact with people who are sick. . Avoid places or events with large numbers of people in one location, like concerts or sporting events. . If you have some symptoms but not all symptoms, continue to monitor at home and seek medical attention if your symptoms worsen. . If you are having a medical emergency, call 911.   [place] / e-Visit: [email or web address]         [facility]: [facility]: [id number]                    [facility]: [id number]     It is flu season:   >>> Best ways to protect herself from the flu: Receive the yearly flu vaccine, practice good hand hygiene washing with soap and also using hand sanitizer when available, eat a nutritious meals, get adequate rest, hydrate appropriately   Please contact the office if your symptoms worsen or you have concerns that you are not improving.   Thank you for choosing [facility] for your healthcare, and for allowing [place] to partner with you on your healthcare journey. I am thankful to be able to provide care to you today.   Wyn Quaker FNP-C

## 2018-11-23 DIAGNOSIS — J449 Chronic obstructive pulmonary disease, unspecified: Secondary | ICD-10-CM | POA: Diagnosis not present

## 2018-11-23 DIAGNOSIS — J9611 Chronic respiratory failure with hypoxia: Secondary | ICD-10-CM | POA: Diagnosis not present

## 2018-11-25 ENCOUNTER — Other Ambulatory Visit: Payer: Self-pay

## 2018-11-25 ENCOUNTER — Ambulatory Visit (INDEPENDENT_AMBULATORY_CARE_PROVIDER_SITE_OTHER): Payer: Medicare HMO | Admitting: Pulmonary Disease

## 2018-11-25 ENCOUNTER — Encounter: Payer: Self-pay | Admitting: Pulmonary Disease

## 2018-11-25 ENCOUNTER — Telehealth: Payer: Self-pay | Admitting: Pulmonary Disease

## 2018-11-25 DIAGNOSIS — J441 Chronic obstructive pulmonary disease with (acute) exacerbation: Secondary | ICD-10-CM

## 2018-11-25 DIAGNOSIS — J9611 Chronic respiratory failure with hypoxia: Secondary | ICD-10-CM | POA: Diagnosis not present

## 2018-11-25 DIAGNOSIS — J449 Chronic obstructive pulmonary disease, unspecified: Secondary | ICD-10-CM | POA: Diagnosis not present

## 2018-11-25 MED ORDER — TRELEGY ELLIPTA 100-62.5-25 MCG/INH IN AEPB: 1.0000 | INHALATION_SPRAY | Freq: Every day | RESPIRATORY_TRACT | 0 refills | Status: DC

## 2018-11-25 MED ORDER — PREDNISONE 10 MG PO TABS: ORAL_TABLET | ORAL | 0 refills | Status: DC

## 2018-11-25 NOTE — Assessment & Plan Note (Signed)
Slow to resolve COPD exacerbation  Plan: We will start patient on Trelegy Ellipta Extended prednisone dosing for 5 more days Stop Anoro Ellipta Keep follow-up in our office in December/2020

## 2018-11-25 NOTE — Assessment & Plan Note (Signed)
Not able to check his oxygen level at home We will continue to monitor this clinically

## 2018-11-25 NOTE — Progress Notes (Signed)
Virtual Visit via Video Note  I connected with Justin Walters on 11/25/18 at 10:00 AM EST by a video enabled telemedicine application and verified that I am speaking with the correct person using two identifiers.  Location: Patient: Home Provider: Office - [facility] - [id number], [address], [place], [place] [address]  I discussed the limitations of evaluation and management by telemedicine and the availability of in person appointments. The patient expressed understanding and agreed to proceed. I also discussed with the patient that there may be a patient responsible charge related to this service. The patient expressed understanding and agreed to proceed.  Patient consented to consult via telephone: Yes People present and their role in pt care: Pt   History of Present Illness:  78 year old male former smoker followed in our office for gold 3 COPD  PMH: gerd, htn Smoker/ Smoking History: Former smoker.  102 pack years. Maintenance:  Anoro Ellipta  Pt of: Dr. Melvyn Novas  Chief complaint: Recent Z-Pak and steroid taper, patient's symptoms have not improved  78 year old male former smoker followed in our office for COPD.  Patient was recently treated telephonically with a Z-Pak as well as a steroid taper.  He contacted our office on 11/25/2018 due to symptoms not improving.  Patient reporting that he remains adherent to Anoro Ellipta.  He was previously managed on Symbicort 160 but this inhaler became too expensive.  He was placed on Anoro Ellipta based off of sample availability in our office.  Patient does have a history of having elevated eosinophil counts.  Most recent blood work in July/2020 does show an eosinophil count of 300.  Patient still feels congested with a productive cough.  He does not know the color of the mucus as he continues to swallow it.  He denies body aches or chills or loss of smell or taste.  He does feel fatigued.  He does feel that he is symptomatically improved  slightly since last being treated telephonically.  Patient denies rescue inhaler use.  He is using his nebulized [place] 2 times daily.   Observations/Objective:  02/23/2017-pulmonary function test-FVC 2.13 (57% predicted), postbronchodilator ratio 54, postbronchodilator FEV1 1.17 (44% predicted), no bronchodilator response, DLCO 11.28 (39% predicted)  Assessment and Plan:  COPD exacerbation (HCC) Slow to resolve COPD exacerbation  Plan: We will start patient on Trelegy Ellipta Extended prednisone dosing for 5 more days Stop Anoro Ellipta Keep follow-up in our office in December/2020  Chronic respiratory failure with hypoxia ([place]) Not able to check his oxygen level at home We will continue to monitor this clinically  COPD GOLD III Plan: Prednisone sent to pharmacy Stop [facility] Start Trelegy Ellipta, samples provided today Keep follow-up in our office in December/2020    Follow Up Instructions:  Return in about 2 weeks (around 12/09/2018), or if symptoms worsen or fail to improve, for Follow up with Dr. Melvyn Novas.    I discussed the assessment and treatment plan with the patient. The patient was provided an opportunity to ask questions and all were answered. The patient agreed with the plan and demonstrated an understanding of the instructions.   The patient was advised to call back or seek an in-person evaluation if the symptoms worsen or if the condition fails to improve as anticipated.  I provided 24 minutes of non-face-to-face time during this encounter.   Lauraine Rinne, NP

## 2018-11-25 NOTE — Telephone Encounter (Signed)
Call made to patient, confirmed DOB, He states he does not feel any better. He states his chest is getting congested. He reports feeling achy all over. He reports he does not feel like his cough got any better while on the medication. MyChart appt made.   Nothing further needed at this time.

## 2018-11-25 NOTE — Assessment & Plan Note (Signed)
Plan: Prednisone sent to pharmacy Stop [facility] Start Trelegy Ellipta, samples provided today Keep follow-up in our office in December/2020

## 2018-11-25 NOTE — Patient Instructions (Signed)
You were seen today by Lauraine Rinne, NP  for:   1. COPD GOLD III  - predniSONE (DELTASONE) 10 MG tablet; Take 2 tablets (20mg  total) daily for the next 5 days. Take in the AM with food.  Dispense: 10 tablet; Refill: 0  Trial of Trelegy Ellipta  >>> 1 puff daily in the morning >>>rinse mouth out after use  >>> This inhaler contains 3 medications that help manage her respiratory status, contact our office if you cannot afford this medication or unable to remain on this medication  STOP ANORO ELLIPTA   Note your daily symptoms > remember "red flags" for COPD:   >>>Increase in cough >>>increase in sputum production >>>increase in shortness of breath or activity  intolerance.   If you notice these symptoms, please call the office to be seen.    2. Chronic respiratory failure with hypoxia (HCC)  Continue to monitor your symptoms clinically.  3. COPD exacerbation (HCC)  Slow to resolve for COPD exacerbation, will extend prednisone dosing  - predniSONE (DELTASONE) 10 MG tablet; Take 2 tablets (20mg  total) daily for the next 5 days. Take in the AM with food.  Dispense: 10 tablet; Refill: 0  Start Trelegy Ellipta as listed above  We recommend today:  No orders of the defined types were placed in this encounter.  No orders of the defined types were placed in this encounter.  Meds ordered this encounter  Medications  . predniSONE (DELTASONE) 10 MG tablet    Sig: Take 2 tablets (20mg  total) daily for the next 5 days. Take in the AM with food.    Dispense:  10 tablet    Refill:  0  . Fluticasone-Umeclidin-Vilant (TRELEGY ELLIPTA) 100-62.5-25 MCG/INH AEPB    Sig: Inhale 1 puff into the lungs daily.    Dispense:  2 each    Refill:  0    Follow Up:    Return in about 2 weeks (around 12/09/2018), or if symptoms worsen or fail to improve, for Follow up with Dr. Melvyn Novas.   Please do your part to reduce the spread of COVID-19:      Reduce your risk of any infection  and COVID19 by  using the similar precautions used for avoiding the common cold or flu:  Marland Kitchen Wash your hands often with soap and warm water for at least 20 seconds.  If soap and water are not readily available, use an alcohol-based hand sanitizer with at least 60% alcohol.  . If coughing or sneezing, cover your mouth and nose by coughing or sneezing into the elbow areas of your shirt or coat, into a tissue or into your sleeve (not your hands). Langley Gauss A MASK when in public  . Avoid shaking hands with others and consider head nods or verbal greetings only. . Avoid touching your eyes, nose, or mouth with unwashed hands.  . Avoid close contact with people who are sick. . Avoid places or events with large numbers of people in one location, like concerts or sporting events. . If you have some symptoms but not all symptoms, continue to monitor at home and seek medical attention if your symptoms worsen. . If you are having a medical emergency, call 911.   [place] / e-Visit: [email or web address]         [facility]: [facility]: [phone]  [facility]: [phone]     It is flu season:   >>> Best ways to protect herself from the flu: Receive the yearly flu vaccine, practice good hand hygiene washing with soap and also using hand sanitizer when available, eat a nutritious meals, get adequate rest, hydrate appropriately   Please contact the office if your symptoms worsen or you have concerns that you are not improving.   Thank you for choosing [facility] for your healthcare, and for allowing [place] to partner with you on your healthcare journey. I am thankful to be able to provide care to you today.   Wyn Quaker FNP-C

## 2018-11-30 DIAGNOSIS — J449 Chronic obstructive pulmonary disease, unspecified: Secondary | ICD-10-CM | POA: Diagnosis not present

## 2018-12-02 ENCOUNTER — Telehealth: Payer: Self-pay | Admitting: Internal Medicine

## 2018-12-02 MED ORDER — PREDNISONE 10 MG PO TABS: ORAL_TABLET | ORAL | 0 refills | Status: DC

## 2018-12-02 MED ORDER — AZITHROMYCIN 250 MG PO TABS: ORAL_TABLET | ORAL | 0 refills | Status: DC

## 2018-12-02 NOTE — Telephone Encounter (Signed)
Spoke with patient. He is aware of MW's recs. While on the phone, he also stated that he has an appt with MW on 12/3. Advised him that based on the recs from [place], I would need to switch this to a televisit. Patient declined due to having one of these in the past and he prefers to see MW in the office. Advised I would check with MW.   Spoke with MW verbally. MW has requested this visit be switched over to a televisit.   Left message for patient to call back.

## 2018-12-02 NOTE — Telephone Encounter (Signed)
Prednisone 10 mg take  4 each am x 2 days,   2 each am x 2 days,  1 each am x 2 days and stop   zpak   strick isolation x 10 days and if getting worse go to ER for covid 19 testing

## 2018-12-02 NOTE — Telephone Encounter (Signed)
Spoke with the pt  He had televisit with Wyn Quaker 11/25/18- given pred x 5 days  States this helped while on med but since he finished 2 days ago having increased cough-non prod, increased SOB, and body aches  He denies any chills or sweats and states not running a fever, however he has no thermometer to check his temp with  He is requesting more prednisone  Please advise thanks!

## 2018-12-05 ENCOUNTER — Encounter: Payer: Self-pay | Admitting: Internal Medicine

## 2018-12-05 ENCOUNTER — Ambulatory Visit (INDEPENDENT_AMBULATORY_CARE_PROVIDER_SITE_OTHER): Payer: Medicare HMO | Admitting: Internal Medicine

## 2018-12-05 DIAGNOSIS — J449 Chronic obstructive pulmonary disease, unspecified: Secondary | ICD-10-CM

## 2018-12-05 MED ORDER — PREDNISONE 10 MG PO TABS: ORAL_TABLET | ORAL | 0 refills | Status: DC

## 2018-12-05 NOTE — Progress Notes (Signed)
Subjective:     Patient ID: Justin Walters, male   DOB: 1940/02/18,     MRN: [id number]    Brief patient profile:  78 yowm MM quit smoking 1995 with cough/wheeze/ sob and never recovered with documented GOLD III copd 02/23/2017      History of Present Illness  01/25/2017 1st  office visit/ [facility]   GOLD III with reversibility  Chief Complaint  Patient presents with  . Pulmonary Consult    Referred by the hospital. He c/o SOB and coughing for the past several wks. His cough is occ prod, but he is unsure of sputum color. He is SOB with exertion and also when he lies down.  He gets winded walking approx 100 yards. He is using his albuterol inhaler 3 x per wk on average.   baseline doe across the street to son's house  nl pace and then onset   x one year gradually worse doe and no better on inhalers but poor baseline hfa/ assoc with cough / sense of chest congestion and has to prop up at 30 degrees now at hs to get comfortable Present doe = MMRC3 = can't walk 100 yards even at a slow pace at a flat grade s stopping due to sob  Easily confused with details of care/ names of meds / not using spiriva/ was better on pred and worse since tapered rec Plan A = Automatic =  symbicort 160 Take 2 puffs first thing in am and then another 2 puffs about 12 hours later.  Work on inhaler technique:  Plan B = Backup Only use your albuterol  as a rescue  Prednisone 10 mg take  4 each am x 2 days,   2 each am x 2 days,  1 each am x 2 days and stop     02/23/2017  f/u ov/Rishik Tubby re:  GOLD III copd / no 02 Chief Complaint  Patient presents with  . Follow-up    PFT's today.  Breathing has improved some. He is rarely using his albuterol inhaler. He has been feeling fatigued.    better p prednisone then gradually worse  Dyspnea:  MMRC2 = can't walk a nl pace on a flat grade s sob but does fine slow and flat  Cough: none Sleep: ok 30 degrees no am congestion rec Add tudorza one puff after symbicort in am and in pm   Work on inhaler technique:      05/24/2017  f/u ov/Jullian Previti re:  Copd III/ no 02  Just on maint symb 160 / could not afford tudorza and not sure it helped doe  Chief Complaint  Patient presents with  . Follow-up    Breathing is doing well. He rarely uses his albuterol inhaler. No new co's.   Dyspnea:  MMRC2 = can't walk a nl pace on a flat grade s sob but does fine slow pace pretty much anywhere he wants to go  Cough: none Sleep: ok flat SABA use:  Rare  Hb every hs x sev months / resolves p about an hour, better p sit up / no assoc dysphagia or daytime symptoms/ varies some with how early he eats supper and how much, not sure what type meals make it worse  rec GERD diet  If not better add pepcid ac 20 mg one hour before bed > did not  Do   10/31/17 NP eval for 3-4 weeks worse sob/ cough rec Xopenex in office  Depo 80 Prednisone 10mg  tablet  >>>  4 tabs for 2 days, then 3 tabs for 2 days, 2 tabs for 2 days, then 1 tab for 2 days, then sto Azithromycin 250mg  tablet  >>>Take 2 tablets (500mg  total) today, and then 1 tablet (250mg ) for the next four days      11/21/2017 acute extended ov/Neilson Oehlert re: refractory cough x early oct 2019  / no change sob  Chief Complaint  Patient presents with  . Acute Visit    pt c/o sinus congestion, pnd, prod cough with white mucus X1 month.  has had 2 rounds of pred and doxy with little relief.   dyspnea : MMRC2 = can't walk a nl pace on a flat grade s sob but does fine slow and flat= baseline when not coughing  Cough > white mucus  not worse in am's/ harsh daytime coughing fts Sleeping flat two pillow saba  Not at all now rec Plan A = Automatic = symbicort 160 Take 2 puffs first thing in am and then another 2 puffs about 12 hours later.  Whenever your cough flares > try  Try prilosec otc 20mg   Take 30-60 min before first meal of the day and Pepcid ac (famotidine) 20 mg one after hour after supper or at bedtime until cough is completely gone for at  least a week without the need for cough suppression Plan B = Backup Only use your albuterol inhaler as a rescue medication  goes up over your usual need - Don't leave home without it !!  (think of it like the spare tire for your car)  For cough Mucinex dm 1200 mg every 12 hours and the tylenol #3  Up to every 4 hours if need for persistent irritating coughing  GERD diet      12/05/2017 acute extended ov/Madisyn Mawhinney re: cough and sob worse since oc 2019 Chief Complaint  Patient presents with  . Acute Visit    Increased SOB, cough and chest congestion x 1 month. Sputum is white to yellow. He is using his albuterol inhaler 2 x per wk on average.   cough is worse p supper and fine on back with  flat bed/ no blocks  In am feels fine but groggy p benadryl - can't  sleep s it W/in 20 min of rising lots of pnds > white mucus  Not using mucinex dm / reversing ppi and h2  Technique on symb poor and not using saba correctly in terms or when to use  rec Augmentin 875 mg take one pill twice daily  X 10 days - take at breakfast and supper with large glass of water.  .  Take prednisone  4 for three days 3 for three days 2 for three days 1 for three days and stop  Omeprazole 20 mg Take 30- 60 min before your first and last meals of the day and continue famotidine 20 mg at bedtime Bed blocks would be a great idea Try off zquil and tylenol #3  Up to 2 at bedtime For cough > mucinex dm 1200 mg every 12 hours and use the flutter valve as much as possible and if still can't stop coughing then use the tylenol #3        12/20/2017  f/u ov/Octavius Shin re:  Cough better, doe not quite baseline on symb 160 2bid  No worse off pred Chief Complaint  Patient presents with  . Follow-up    Breathing has improved some but is not back to his normal baseline. He rarely uses  his albuterol inhaler.   Dyspnea:  MMRC2 = can't walk a nl pace on a flat grade s sob but does fine slow and flat  Cough: better/ no mucus production at all   Sleeping: ok flat  SABA use: rarely 02: none   rec Bed blocks 6-8 inches  For cough > Mucinex dm up to 1200 mg twice daily and use the flutter valve as much as you can Gabapentin should be three times daily  Omeprazole is Take 30-60 min before first meal of the day and pepcid ac 20 mg after supper     01/25/2018 acute extended ov/Tarquin Welcher re:  Copd III / stopped symb too expensive, does better with nebs / overall not doing well since early Oct 2019  Chief Complaint  Patient presents with  . Acute Visit    SOB, runny nose, ear pain, wheezing, cough- white mucus at times, denies fever   brought meds/ not using ppi as instructed and ? If really using flutter, doesn't think it helps but did not bring it as rec to show him how to use/ using halls cough drops with menthol which was discouraged last ov/ gradually worse doe/ subj wheeze x sev weeks no better with hfa but technique poor/ no purulent mucus/ cough worse at hs and in am. No better on prednisone  rec Duoneb (albuterol and atrovent)  4 x daily Only use your albuterol as a rescue medication   Work on inhaler technique:  Be sure you take prilosec Take 30- 60 min before your first and last meals of the day  Change your appt to  - add sinus ct / hrct chest if still coughing on return      02/22/2018  f/u ov/Melanye Hiraldo re: COPD  GOLD III maint on duoneb tid  Chief Complaint  Patient presents with  . Follow-up    Increased SOB, wheezing, and cough- non over the past 3-4 wks. He is using his albuterol inhaler 3-4 x per day and neb 3 x per day.   Dyspnea:  Still does [employer] with hc parking no 02  Cough: p supper is the worst but never prod Sleeping: bed blocks/ wakes up tight in chest around 2 am p last neb at 7 pm  SABA use: as above  rec Plan A = Automatic = Symbicort 160 Take 2 puffs first thing in am and then another 2 puffs about 12 hours later and duoneb four times   Work on inhaler technique:   Plan B = Backup Only use your  albuterol inhaler as a rescue medication Prednisone 10 mg  Take 4 for three days 3 for three days 2 for three days 1 for three days and stop  Please see patient coordinator before you leave today  to schedule 02 2lpm 24/7 for now Please remember to go to the  x-ray department  for your tests - we will call you with the results when they are available   Please schedule a follow up office visit in 2 weeks, sooner if needed  with all medications /inhalers/ solutions in hand so we can verify exactly what you are taking. This includes all medications from all doctors and over the counters and bring your DRUG FORMULARY  - consider change coreg to bisoprolol or at least bystolic samples short run  - add levaquin 500 mg x 10 d for ? pna    03/08/2018  f/u ov/Jerrold Haskell re:  COPD  III  On symb and duoneb qid  Chief Complaint  Patient presents with  . Follow-up    Breathing has improved back to baseline. He is not using his albuterol inhaler but is using his duoneb 4 x daily.   Dyspnea:  Better, usually does [employer] leaning on cart s 02  Cough: none now Sleeping: bed blocks  SABA use: none extra in hfa form  02: 2lpm at hs not with activity  rec Plan A = Automatic = Incruse/BREO  one click eam am and continue 02 2 lpm at bedtime and wear it  as needed during the day to keep your sats > 90% with walking or go slower Plan B = Backup Only use your albuterol inhaler as a rescue medication  Plan C = Crisis - only use your albuterol nebulizer(not your ipatropium)  if you first try Plan B and it fails to help > ok to use the nebulizer up to every 4 hours but if start needing it regularly call for immediate appointment Please remember to go to the  x-ray department  for your tests - we will call you with the results when they are available   Please schedule a follow up office visit in 2 weeks with your formulary , sooner if needed  with all medications /inhalers/ solutions in hand so we can verify exactly what  you are taking. This includes all medications from all doctors and over the counters (ok to see NP if I don't have opening )    03/25/2018  f/u ov/Jeaneen Cala re:  COPD III overall worse since Oct 2019, esp since Feb 02 2018 Chief Complaint  Patient presents with  . Follow-up    Breathing has been progressively worse since the last visit. He is using his rescue inhaler daily and neb 4 x daily.   Dyspnea:  Across the room on 3lpm  Cough: turned green x sev days prior to OV / sometimes coughs so hard hurts in chest (ant/ generalized)  but not using flutter as rec   Sleeping: bed blocks does fine most nocts SABA use: as above  02:  3lpm 24/7  rec Change BREO / Incruse to Anoro click x one and take two good drags (hold with the R hand) Prednisone 10 mg take  4 each am x 2 days,   2 each am x 2 days,  1 each am x 2 days and stop  Augmentin 875 mg take one pill twice daily  X 10 days -  Plan B = Backup Only use your albuterol inhaler as a rescue medication Plan C = Crisis - only use your albuterol nebulizer(NOT your ipatropium/albuterol)  if you first try Plan B Add protonix 40 mg Take 30-60 min before first meal of the day  The goal with 02 is to keep your 02 saturations over 90% at all times   Admit date: 03/26/2018 Discharge date: 03/28/2018  Admitted From: Home Disposition: Home  Brief/Interim Summary:78 y.o.malewith a history of3L O2-dependent COPD , HTN, HLD, nephrolithiasis s/p ESWL 2019, BPH with LUTS who presented with acute dyspnea and wheezing. He reports 1-2 weeks of increased, intermittent, moderate wheezing improved with home nebulizer treatments, as well as slight increase in his chronic cough in that it is more productive, reported blue-green.Was evaluated yesterday by pulmonology, CXR showed infiltrate, started on augmentin and prednisone which he hasn't taken yet.He lives with his daughter who works in [employer] and has continued working, but isn't ill.  ED  Course:Initially in significant respiratory distress requiring BiPAP, given  nebs, solumedrol. Temp 100.6F, tachypneic, tachycardic. WBC 123XX123 with neutrophilic predominance, lymphs 1.1k, creatinine 1.31, platelets 577, hgb 11. Flu negative, covid assay sent.Respiratory effort has improved, but has not returned to baseline, so admission was requested.  Review of Systems:He denies fever, chills, has some chest tightness when coughing only. No leg swelling, orthopnea, PND. He denies abdominal pain, dysuria, or change in urinary symptoms but does have LUTS from BPH which are stable. Otherwiseper HPI. All others reviewed and are negative.     Discharge Diagnoses:  Principal Problem:   Acute on chronic respiratory failure with hypoxia and hypercapnia (HCC) Active Problems:   Hyperlipidemia   Essential hypertension   CKD (chronic kidney disease), stage III (HCC)   BPH (benign prostatic hyperplasia)   RENAL CALCULUS, HX OF   COPD exacerbation (HCC)   GERD (gastroesophageal reflux disease)   #1 acute on chronic hypoxic hypercapnic respiratory failure secondary to COPD exacerbation and possible pneumonia viral versus bacterial. Patient is on 3 L of oxygen prior to admission to the hospital. He lives at home with his daughter. His daughter works in [employer] and is continuing to work.  Family is able to quarantine him at home for 14 days or till COVID-19 results come back.  I will discharge him on Augmentin for 4 more days along with prednisone taper.  He was MRSA PCR negative.  Continue MDI as he was doing at home.. Chest x-ray 23 2020 shows hyperexpanded lungs with patchy airspace opacity at the bases likely due to pneumonia, less coarse interstitial thickening and patchy airspace opacity in the upper and mid lungs. Reticular interstitial disease, possible fibrosis. Strep pneumo negative. Covid 19 neg pcr  #2 renal stones with pyuria patient asymptomatic.  #3 GERD continue  Pepcid  #4 hypertension risk and Coreg were stopped due to soft blood pressure this may be restarted as an outpatient anytime as appropriate.    06/06/2018  f/u ov/Shereen Marton re: post hosp off norvasc/ coreg(low bp) and much better since d/c and still on Anoro  Chief Complaint  Patient presents with  . Follow-up    Patient states that he's doing better. He has not had to use his rescue inhaler since last visit.   Dyspnea:  300 yards s stopping to son's house s 02 -  does not check sats Cough: minimal non productive Sleeping: on blocks x 6 in  SABA use: none  02: not using  rec Monitor your 02 saturations toward the end of your activity and try to build it back up to 30 min target with 02 sats over 90% is the target  No change in inhalers    09/10/2018  f/u ov/Jahkari Maclin re: GOLD III copd maint rx anoro  Chief Complaint  Patient presents with  . Follow-up    Breathing is doing well. He is coughing a little more than usual- sputum is white. He rarely uses albuterol inhaler or neb.    Dyspnea:  MMRC2 = can't walk a nl pace on a flat grade s sob but does fine slow and flat walking to son's house s stopping Cough: minimal sporadic mucoid not noct or flaring in am  Sleeping: 6 in blocks SABA use: never or rarely ever hfa, never neb 02: has it but  not using  rec No change rx , return in 6  Months   Virtual Visit via Telephone Note 12/05/2018   I connected with Stevphen Meuse on 12/05/18 at 10:15 AM EST by telephone and verified that  I am speaking with the correct person using two identifiers.   I discussed the limitations, risks, security and privacy concerns of performing an evaluation and management service by telephone and the availability of in person appointments. I also discussed with the patient that there may be a patient responsible charge related to this service. The patient expressed understanding and agreed to proceed.   History of Present Illness: Dyspnea:  Still walking to son's  house x 300 ft flat s stopping regular pace = slower than others  So = MMRC2 = can't walk a nl pace on a flat grade s sob but does fine slow and flat eg  Cough: daytime dry / only resolves on pred Sleeping: 6 in bed blocks  SABA use: rare hfa saba/  02: not using it now    No obvious day to day or daytime variability or assoc excess/ purulent sputum or mucus plugs or hemoptysis or cp or chest tightness, subjective wheeze or overt sinus or hb symptoms.    Also denies any obvious fluctuation of symptoms with weather or environmental changes or other aggravating or alleviating factors except as outlined above.   Meds reviewed/ med reconciliation completed        Observations/Objective: Hoarse with dry sounding spont talking in full phrases    Assessment and Plan: See problem list for active a/p's   Follow Up Instructions: See avs for instructions unique to this ov which includes revised/ updated med list     I discussed the assessment and treatment plan with the patient. The patient was provided an opportunity to ask questions and all were answered. The patient agreed with the plan and demonstrated an understanding of the instructions.   The patient was advised to call back or seek an in-person evaluation if the symptoms worsen or if the condition fails to improve as anticipated.  I provided 25 minutes of non-face-to-face time during this encounter.   Christinia Gully, MD

## 2018-12-05 NOTE — Telephone Encounter (Signed)
Pt had appt with MW today 12/05/18. Will sign off.

## 2018-12-05 NOTE — Patient Instructions (Signed)
Plan A = Automatic = Always=    Change anoro to Trelegy one click each am   Prednisne 10 mg  X 2 each am until better, then 1 daily x 3 days then one half daily until return to office   Plan B = Backup (to supplement plan A, not to replace it) Only use your albuterol inhaler as a rescue medication to be used if you can't catch your breath by resting or doing a relaxed purse lip breathing pattern.  - The less you use it, the better it will work when you need it. - Ok to use the inhaler up to 2 puffs  every 4 hours if you must but call for appointment if use goes up over your usual need - Don't leave home without it !!  (think of it like the spare tire for your car)   Plan C = Crisis (instead of Plan B but only if Plan B stops working) - only use your albuterol nebulizer if you first try Plan B and it fails to help > ok to use the nebulizer up to every 4 hours but if start needing it regularly call for immediate appointment  Please schedule a follow up office visit in 2 weeks, sooner if needed  with all medications /inhalers/ solutions in hand so we can verify exactly what you are taking. This includes all medications from all doctors and over the counters  with cxr on return

## 2018-12-05 NOTE — Assessment & Plan Note (Addendum)
Quit smoking 1995 PFT's  01/20/2017  FEV1 1.05 (39 % ) ratio 54  p 18 % improvement from saba p ? prior to study with DLCO  50/53  % corrects to 77 % for alv volume  - 01/25/2017   rec increase symb to 160 2bid trial  - alpha one AT screening  01/18/17   MM  Level 190 PFT's  02/23/2017  FEV1 1.17 (44 % ) ratio 54  p 0 % improvement from saba p symb 160 prior to study with DLCO   39 % corrects to 59 % for alv volume  - 02/23/2017    add tudorza trial > no change and could not afford - 12/05/2017 flutter valve added   - 02/22/2018    > restart symb 160 2bid - 03/08/2018  After extensive coaching inhaler device,  effectiveness =    90% with elipta so try Anoro daily > improved as of 06/06/2018  - 12/05/2018 reported on televisit much worse everytime stops pred while on anoro so rec change to trelegy   The goal with a chronic steroid dependent illness is always arriving at the lowest effective dose that controls the disease/symptoms and not accepting a set "formula" which is based on statistics or guidelines that don't always take into account patient  variability or the natural hx of the dz in every individual patient, which may well vary over time.  For now therefore I recommend the patient maintain pred 20 ceiling/ 5 mg floor until seen in 2 weeks with all meds in hand using a trust but verify approach to confirm accurate Medication  Reconciliation The principal here is that until we are certain that the  patients are doing what we've asked, it makes no sense to ask them to do more.   Apparently  Group D in terms of symptom/risk and laba/lama/ICS  therefore appropriate rx at this point >>>  trelegy and pred as above  Discussed in detail all the  indications, usual  risks and alternatives  relative to the benefits with patient who agrees to proceed with Rx as outlined.      >>> f/u in 2 weeks to regroup and ? Wean off pred completely but must return with all meds in hand using a trust but verify approach to  confirm accurate Medication  Reconciliation The principal here is that until we are certain that the  patients are doing what we've asked, it makes no sense to ask them to do more.    Each maintenance medication was reviewed in detail including most importantly the difference between maintenance and as needed and under what circumstances the prns are to be used.  Please see AVS for specific  Instructions which are unique to this visit and I personally typed out  which were reviewed in detail over the phone with the patient and a copy provided via my chart

## 2018-12-07 ENCOUNTER — Telehealth: Payer: Self-pay | Admitting: Emergency Medicine

## 2018-12-07 NOTE — Telephone Encounter (Signed)
Called by pharmacy to clarify instructions for pred taper.  I asked them to give 40 tabs, 10mg  pred. Dr Gustavus Bryant instructions were 20mg  daily until better, then taper as per his note.

## 2018-12-19 ENCOUNTER — Telehealth: Payer: Self-pay | Admitting: *Deleted

## 2018-12-19 NOTE — Telephone Encounter (Signed)
Yes, but I believe this was already done.

## 2018-12-19 NOTE — Telephone Encounter (Signed)
Called patient to do C-19 screening Pt answered yes to pending covid test result. His daughter was tested 2 days ago and he is not aware of results yet. She lives in the home with him but he states she is ok now. Does visit type need to change?

## 2018-12-19 NOTE — Telephone Encounter (Signed)
Spoke with the pt and have scheduled his visit as a televisit

## 2018-12-20 ENCOUNTER — Encounter: Payer: Self-pay | Admitting: Internal Medicine

## 2018-12-20 ENCOUNTER — Ambulatory Visit (INDEPENDENT_AMBULATORY_CARE_PROVIDER_SITE_OTHER): Payer: Medicare HMO | Admitting: Internal Medicine

## 2018-12-20 ENCOUNTER — Other Ambulatory Visit: Payer: Self-pay

## 2018-12-20 DIAGNOSIS — J449 Chronic obstructive pulmonary disease, unspecified: Secondary | ICD-10-CM

## 2018-12-20 MED ORDER — TRELEGY ELLIPTA 100-62.5-25 MCG/INH IN AEPB: 1.0000 | INHALATION_SPRAY | Freq: Every day | RESPIRATORY_TRACT | 11 refills | Status: DC

## 2018-12-20 NOTE — Progress Notes (Signed)
Subjective:     Patient ID: Justin Walters, male   DOB: [birth year],     MRN: [id number]    Brief patient profile:  78 yowm MM quit smoking 1995 with cough/wheeze/ sob and never recovered with documented GOLD III copd 02/23/2017      History of Present Illness  01/25/2017 1st  office visit/ Justin Walters   GOLD III with reversibility  Chief Complaint  Patient presents with  . Pulmonary Consult    Referred by the hospital. He c/o SOB and coughing for the past several wks. His cough is occ prod, but he is unsure of sputum color. He is SOB with exertion and also when he lies down.  He gets winded walking approx 100 yards. He is using his albuterol inhaler 3 x per wk on average.   baseline doe across the street to son's house  nl pace and then onset   x one year gradually worse doe and no better on inhalers but poor baseline hfa/ assoc with cough / sense of chest congestion and has to prop up at 30 degrees now at hs to get comfortable Present doe = MMRC3 = can't walk 100 yards even at a slow pace at a flat grade s stopping due to sob  Easily confused with details of care/ names of meds / not using spiriva/ was better on pred and worse since tapered rec Plan A = Automatic =  symbicort 160 Take 2 puffs first thing in am and then another 2 puffs about 12 hours later.  Work on inhaler technique:  Plan B = Backup Only use your albuterol  as a rescue  Prednisone 10 mg take  4 each am x 2 days,   2 each am x 2 days,  1 each am x 2 days and stop     02/23/2017  f/u ov/Justin Walters re:  GOLD III copd / no 02 Chief Complaint  Patient presents with  . Follow-up    PFT's today.  Breathing has improved some. He is rarely using his albuterol inhaler. He has been feeling fatigued.    better p prednisone then gradually worse  Dyspnea:  MMRC2 = can't walk a nl pace on a flat grade s sob but does fine slow and flat  Cough: none Sleep: ok 30 degrees no am congestion rec Add tudorza one puff after symbicort in am and in pm   Work on inhaler technique:      05/24/2017  f/u ov/Justin Walters re:  Copd III/ no 02  Just on maint symb 160 / could not afford tudorza and not sure it helped doe  Chief Complaint  Patient presents with  . Follow-up    Breathing is doing well. He rarely uses his albuterol inhaler. No new co's.   Dyspnea:  MMRC2 = can't walk a nl pace on a flat grade s sob but does fine slow pace pretty much anywhere he wants to go  Cough: none Sleep: ok flat SABA use:  Rare  Hb every hs x sev months / resolves p about an hour, better p sit up / no assoc dysphagia or daytime symptoms/ varies some with how early he eats supper and how much, not sure what type meals make it worse  rec GERD diet  If not better add pepcid ac 20 mg one hour before bed > did not  Do   10/31/17 NP eval for 3-4 weeks worse sob/ cough rec Xopenex in office  Depo 80 Prednisone 10mg  tablet  >>>  4 tabs for 2 days, then 3 tabs for 2 days, 2 tabs for 2 days, then 1 tab for 2 days, then sto Azithromycin 250mg  tablet  >>>Take 2 tablets (500mg  total) today, and then 1 tablet (250mg ) for the next four days      11/21/2017 acute extended ov/Justin Walters re: refractory cough x early oct 2019  / no change sob  Chief Complaint  Patient presents with  . Acute Visit    pt c/o sinus congestion, pnd, prod cough with white mucus X1 month.  has had 2 rounds of pred and doxy with little relief.   dyspnea : MMRC2 = can't walk a nl pace on a flat grade s sob but does fine slow and flat= baseline when not coughing  Cough > white mucus  not worse in am's/ harsh daytime coughing fts Sleeping flat two pillow saba  Not at all now rec Plan A = Automatic = symbicort 160 Take 2 puffs first thing in am and then another 2 puffs about 12 hours later.  Whenever your cough flares > try  Try prilosec otc 20mg   Take 30-60 min before first meal of the day and Pepcid ac (famotidine) 20 mg one after hour after supper or at bedtime until cough is completely gone for at  least a week without the need for cough suppression Plan B = Backup Only use your albuterol inhaler as a rescue medication  goes up over your usual need - Don't leave home without it !!  (think of it like the spare tire for your car)  For cough Mucinex dm 1200 mg every 12 hours and the tylenol #3  Up to every 4 hours if need for persistent irritating coughing  GERD diet      12/05/2017 acute extended ov/Justin Walters re: cough and sob worse since oc 2019 Chief Complaint  Patient presents with  . Acute Visit    Increased SOB, cough and chest congestion x 1 month. Sputum is white to yellow. He is using his albuterol inhaler 2 x per wk on average.   cough is worse p supper and fine on back with  flat bed/ no blocks  In am feels fine but groggy p benadryl - can't  sleep s it W/in 20 min of rising lots of pnds > white mucus  Not using mucinex dm / reversing ppi and h2  Technique on symb poor and not using saba correctly in terms or when to use  rec Augmentin 875 mg take one pill twice daily  X 10 days - take at breakfast and supper with large glass of water.  .  Take prednisone  4 for three days 3 for three days 2 for three days 1 for three days and stop  Omeprazole 20 mg Take 30- 60 min before your first and last meals of the day and continue famotidine 20 mg at bedtime Bed blocks would be a great idea Try off zquil and tylenol #3  Up to 2 at bedtime For cough > mucinex dm 1200 mg every 12 hours and use the flutter valve as much as possible and if still can't stop coughing then use the tylenol #3        12/20/2017  f/u ov/Justin Walters re:  Cough better, doe not quite baseline on symb 160 2bid  No worse off pred Chief Complaint  Patient presents with  . Follow-up    Breathing has improved some but is not back to his normal baseline. He rarely uses  his albuterol inhaler.   Dyspnea:  MMRC2 = can't walk a nl pace on a flat grade s sob but does fine slow and flat  Cough: better/ no mucus production at all   Sleeping: ok flat  SABA use: rarely 02: none   rec Bed blocks 6-8 inches  For cough > Mucinex dm up to 1200 mg twice daily and use the flutter valve as much as you can Gabapentin should be three times daily  Omeprazole is Take 30-60 min before first meal of the day and pepcid ac 20 mg after supper     01/25/2018 acute extended ov/Declin Rajan re:  Copd III / stopped symb too expensive, does better with nebs / overall not doing well since early Oct 2019  Chief Complaint  Patient presents with  . Acute Visit    SOB, runny nose, ear pain, wheezing, cough- white mucus at times, denies fever   brought meds/ not using ppi as instructed and ? If really using flutter, doesn't think it helps but did not bring it as rec to show him how to use/ using halls cough drops with menthol which was discouraged last ov/ gradually worse doe/ subj wheeze x sev weeks no better with hfa but technique poor/ no purulent mucus/ cough worse at hs and in am. No better on prednisone  rec Duoneb (albuterol and atrovent)  4 x daily Only use your albuterol as a rescue medication   Work on inhaler technique:  Be sure you take prilosec Take 30- 60 min before your first and last meals of the day  Change your appt to  - add sinus ct / hrct chest if still coughing on return      02/22/2018  f/u ov/Enrrique Mierzwa re: COPD  GOLD III maint on duoneb tid  Chief Complaint  Patient presents with  . Follow-up    Increased SOB, wheezing, and cough- non over the past 3-4 wks. He is using his albuterol inhaler 3-4 x per day and neb 3 x per day.   Dyspnea:  Still does [employer] with hc parking no 02  Cough: p supper is the worst but never prod Sleeping: bed blocks/ wakes up tight in chest around 2 am p last neb at 7 pm  SABA use: as above  rec Plan A = Automatic = Symbicort 160 Take 2 puffs first thing in am and then another 2 puffs about 12 hours later and duoneb four times   Work on inhaler technique:   Plan B = Backup Only use your  albuterol inhaler as a rescue medication Prednisone 10 mg  Take 4 for three days 3 for three days 2 for three days 1 for three days and stop  Please see patient coordinator before you leave today  to schedule 02 2lpm 24/7 for now Please remember to go to the  x-ray department  for your tests - we will call you with the results when they are available   Please schedule a follow up office visit in 2 weeks, sooner if needed  with all medications /inhalers/ solutions in hand so we can verify exactly what you are taking. This includes all medications from all doctors and over the counters and bring your DRUG FORMULARY  - consider change coreg to bisoprolol or at least bystolic samples short run  - add levaquin 500 mg x 10 d for ? pna    03/08/2018  f/u ov/Kimie Pidcock re:  COPD  III  On symb and duoneb qid  Chief Complaint  Patient presents with  . Follow-up    Breathing has improved back to baseline. He is not using his albuterol inhaler but is using his duoneb 4 x daily.   Dyspnea:  Better, usually does [employer] leaning on cart s 02  Cough: none now Sleeping: bed blocks  SABA use: none extra in hfa form  02: 2lpm at hs not with activity  rec Plan A = Automatic = Incruse/BREO  one click eam am and continue 02 2 lpm at bedtime and wear it  as needed during the day to keep your sats > 90% with walking or go slower Plan B = Backup Only use your albuterol inhaler as a rescue medication  Plan C = Crisis - only use your albuterol nebulizer(not your ipatropium)  if you first try Plan B and it fails to help > ok to use the nebulizer up to every 4 hours but if start needing it regularly call for immediate appointment Please remember to go to the  x-ray department  for your tests - we will call you with the results when they are available   Please schedule a follow up office visit in 2 weeks with your formulary , sooner if needed  with all medications /inhalers/ solutions in hand so we can verify exactly what  you are taking. This includes all medications from all doctors and over the counters (ok to see NP if I don't have opening )    03/25/2018  f/u ov/Nashea Chumney re:  COPD III overall worse since Oct 2019, esp since Feb 02 2018 Chief Complaint  Patient presents with  . Follow-up    Breathing has been progressively worse since the last visit. He is using his rescue inhaler daily and neb 4 x daily.   Dyspnea:  Across the room on 3lpm  Cough: turned green x sev days prior to OV / sometimes coughs so hard hurts in chest (ant/ generalized)  but not using flutter as rec   Sleeping: bed blocks does fine most nocts SABA use: as above  02:  3lpm 24/7  rec Change BREO / Incruse to Anoro click x one and take two good drags (hold with the R hand) Prednisone 10 mg take  4 each am x 2 days,   2 each am x 2 days,  1 each am x 2 days and stop  Augmentin 875 mg take one pill twice daily  X 10 days -  Plan B = Backup Only use your albuterol inhaler as a rescue medication Plan C = Crisis - only use your albuterol nebulizer(NOT your ipatropium/albuterol)  if you first try Plan B Add protonix 40 mg Take 30-60 min before first meal of the day  The goal with 02 is to keep your 02 saturations over 90% at all times   Admit date: 03/26/2018 Discharge date: 03/28/2018  Admitted From: Home Disposition: Home  Brief/Interim Summary:78 y.o.malewith a history of3L O2-dependent COPD , HTN, HLD, nephrolithiasis s/p ESWL 2019, BPH with LUTS who presented with acute dyspnea and wheezing. He reports 1-2 weeks of increased, intermittent, moderate wheezing improved with home nebulizer treatments, as well as slight increase in his chronic cough in that it is more productive, reported blue-green.Was evaluated yesterday by pulmonology, CXR showed infiltrate, started on augmentin and prednisone which he hasn't taken yet.He lives with his daughter who works in [employer] and has continued working, but isn't ill.  ED  Course:Initially in significant respiratory distress requiring BiPAP, given  nebs, solumedrol. Temp 100.15F, tachypneic, tachycardic. WBC 123XX123 with neutrophilic predominance, lymphs 1.1k, creatinine 1.31, platelets 577, hgb 11. Flu negative, covid assay sent.Respiratory effort has improved, but has not returned to baseline, so admission was requested.  Review of Systems:He denies fever, chills, has some chest tightness when coughing only. No leg swelling, orthopnea, PND. He denies abdominal pain, dysuria, or change in urinary symptoms but does have LUTS from BPH which are stable. Otherwiseper HPI. All others reviewed and are negative.     Discharge Diagnoses:  Principal Problem:   Acute on chronic respiratory failure with hypoxia and hypercapnia (HCC) Active Problems:   Hyperlipidemia   Essential hypertension   CKD (chronic kidney disease), stage III (HCC)   BPH (benign prostatic hyperplasia)   RENAL CALCULUS, HX OF   COPD exacerbation (HCC)   GERD (gastroesophageal reflux disease)   #1 acute on chronic hypoxic hypercapnic respiratory failure secondary to COPD exacerbation and possible pneumonia viral versus bacterial. Patient is on 3 L of oxygen prior to admission to the hospital. He lives at home with his daughter. His daughter works in [employer] and is continuing to work.  Family is able to quarantine him at home for 14 days or till COVID-19 results come back.  I will discharge him on Augmentin for 4 more days along with prednisone taper.  He was MRSA PCR negative.  Continue MDI as he was doing at home.. Chest x-ray 23 2020 shows hyperexpanded lungs with patchy airspace opacity at the bases likely due to pneumonia, less coarse interstitial thickening and patchy airspace opacity in the upper and mid lungs. Reticular interstitial disease, possible fibrosis. Strep pneumo negative. Covid 19 neg pcr  #2 renal stones with pyuria patient asymptomatic.  #3 GERD continue  Pepcid  #4 hypertension risk and Coreg were stopped due to soft blood pressure this may be restarted as an outpatient anytime as appropriate.    06/06/2018  f/u ov/Ashea Winiarski re: post hosp off norvasc/ coreg(low bp) and much better since d/c and still on Anoro  Chief Complaint  Patient presents with  . Follow-up    Patient states that he's doing better. He has not had to use his rescue inhaler since last visit.   Dyspnea:  300 yards s stopping to son's house s 02 -  does not check sats Cough: minimal non productive Sleeping: on blocks x 6 in  SABA use: none  02: not using  rec Monitor your 02 saturations toward the end of your activity and try to build it back up to 30 min target with 02 sats over 90% is the target  No change in inhalers    09/10/2018  f/u ov/Spurgeon Gancarz re: GOLD III copd maint rx anoro  Chief Complaint  Patient presents with  . Follow-up    Breathing is doing well. He is coughing a little more than usual- sputum is white. He rarely uses albuterol inhaler or neb.    Dyspnea:  MMRC2 = can't walk a nl pace on a flat grade s sob but does fine slow and flat walking to son's house s stopping Cough: minimal sporadic mucoid not noct or flaring in am  Sleeping: 6 in blocks SABA use: never or rarely ever hfa, never neb 02: has it but  not using  rec No change rx , return in 6  Months   Virtual Visit via Telephone Note 12/05/2018   I connected with Justin Walters on 12/05/18 at 10:15 AM EST by telephone and verified that  I am speaking with the correct person using two identifiers.   I discussed the limitations, risks, security and privacy concerns of performing an evaluation and management service by telephone and the availability of in person appointments. I also discussed with the patient that there may be a patient responsible charge related to this service. The patient expressed understanding and agreed to proceed.   History of Present Illness: Dyspnea:  Still walking to son's  house x 300 ft flat s stopping regular pace = slower than others  So = MMRC2 = can't walk a nl pace on a flat grade s sob but does fine slow and flat  Cough: daytime dry / only resolves on pred Sleeping: 6 in bed blocks  SABA use: rare hfa saba/  02: not using it now rec Plan A = Automatic = Always=    Change anoro to Trelegy one click each am  Prednisne 10 mg  X 2 each am until better, then 1 daily x 3 days then one half daily until return to office  Plan B = Backup (to supplement plan A, not to replace it) Only use your albuterol inhaler as a rescue medication Plan C = Crisis (instead of Plan B but only if Plan B stops working) - only use your albuterol nebulizer if you first try Plan B  Please schedule a follow up office visit in 2 weeks, sooner if needed    Virtual Visit via Telephone Note 12/20/2018   I connected with Justin Walters on 12/20/18 at  8:30 AM EST by telephone and verified that I am speaking with the correct person using two identifiers.   I discussed the limitations, risks, security and privacy concerns of performing an evaluation and management service by telephone and the availability of in person appointments. I also discussed with the patient that there may be a patient responsible charge related to this service. The patient expressed understanding and agreed to proceed.   History of Present Illness: Still on prednisone 10 mg  Dyspnea:  As good as ever Cough: gone  Sleeping: still on blockers  SABA use: none of either hfa or neb 02: not using it    No obvious day to day or daytime variability or assoc excess/ purulent sputum or mucus plugs or hemoptysis or cp or chest tightness, subjective wheeze or overt sinus or hb symptoms.    Also denies any obvious fluctuation of symptoms with weather or environmental changes or other aggravating or alleviating factors except as outlined above.   Meds reviewed/ med reconciliation completed          Observations/Objective: Sounds great, very upbeat    Assessment and Plan: See problem list for active a/p's   Follow Up Instructions: See avs for instructions unique to this ov which includes revised/ updated med list     I discussed the assessment and treatment plan with the patient. The patient was provided an opportunity to ask questions and all were answered. The patient agreed with the plan and demonstrated an understanding of the instructions.   The patient was advised to call back or seek an in-person evaluation if the symptoms worsen or if the condition fails to improve as anticipated.  I provided 15 minutes of non-face-to-face time during this encounter.   Christinia Gully, MD

## 2018-12-20 NOTE — Patient Instructions (Addendum)
Plan A = Automatic = Always=    Change anoro to Trelegy one click first thing in am   Prednisne 10 mg  1/2 daily x one week then 1/2 every other day until your office   Plan B = Backup (to supplement plan A, not to replace it) Only use your albuterol inhaler as a rescue medication to be used if you can't catch your breath by resting or doing a relaxed purse lip breathing pattern.  - The less you use it, the better it will work when you need it. - Ok to use the inhaler up to 2 puffs  every 4 hours if you must but call for appointment if use goes up over your usual need - Don't leave home without it !!  (think of it like the spare tire for your car)   Plan C = Crisis (instead of Plan B but only if Plan B stops working) - only use your albuterol nebulizer if you first try Plan B and it fails to help > ok to use the nebulizer up to every 4 hours but if start needing it regularly call for immediate appointment  Please schedule a follow up office visit in 2 weeks, sooner if needed  with all medications /inhalers/ solutions in hand so we can verify exactly what you are taking. This includes all medications from all doctors and over the counters Also bring your drug formulary in case you can't afford the trelegy   cxr on return

## 2018-12-20 NOTE — Assessment & Plan Note (Signed)
Quit smoking 1995 PFT's  01/20/2017  FEV1 1.05 (39 % ) ratio 54  p 18 % improvement from saba p ? prior to study with DLCO  50/53  % corrects to 77 % for alv volume  - 01/25/2017   rec increase symb to 160 2bid trial  - alpha one AT screening  01/18/17   MM  Level 190 PFT's  02/23/2017  FEV1 1.17 (44 % ) ratio 54  p 0 % improvement from saba p symb 160 prior to study with DLCO   39 % corrects to 59 % for alv volume  - 02/23/2017    add tudorza trial > no change and could not afford - 12/05/2017 flutter valve added   - 02/22/2018    > restart symb 160 2bid - 03/08/2018  After extensive coaching inhaler device,  effectiveness =    90% with elipta so try Anoro daily > improved as of 06/06/2018  - 12/05/2018 reported on televisit much worse everytime stops pred while on anoro so rec change to trelegy and pred 20 ceiling/ 5 mg floor until seen   - 12/20/2018 improved but did not start trelegy and taking anoro at hs > rec trelegy q am and stop anoro, reduce pred to floor of 5mg  qod prior to next ov in 2 weeks if tol  Comment: The goal with a chronic steroid dependent illness is always arriving at the lowest effective dose that controls the disease/symptoms and not accepting a set "formula" which is based on statistics or guidelines that don't always take into account patient  variability or the natural hx of the dz in every individual patient, which may well vary over time.  For now therefore I recommend the patient maintain  Ceiling of 20 mg and floor of 5 mg qod for now but hope to wean off completely at next ov.    Needs f/u cxr on return   Each maintenance medication was reviewed in detail including most importantly the difference between maintenance and as needed and under what circumstances the prns are to be used.  Please see AVS for specific  Instructions which are unique to this visit and I personally typed out  which were reviewed in detail over the phone with the patient and a copy provided via My chart

## 2018-12-23 DIAGNOSIS — J449 Chronic obstructive pulmonary disease, unspecified: Secondary | ICD-10-CM | POA: Diagnosis not present

## 2018-12-23 DIAGNOSIS — J9611 Chronic respiratory failure with hypoxia: Secondary | ICD-10-CM | POA: Diagnosis not present

## 2018-12-30 DIAGNOSIS — J449 Chronic obstructive pulmonary disease, unspecified: Secondary | ICD-10-CM | POA: Diagnosis not present

## 2019-01-05 IMAGING — DX DG CHEST 2V
2 series · 2 of 2 positions shown · non-contrast
Comparison: 10/31/2017 prior chest radiographs

CLINICAL DATA: COPD.

EXAM:
CHEST - 2 VIEW

[chest lat]
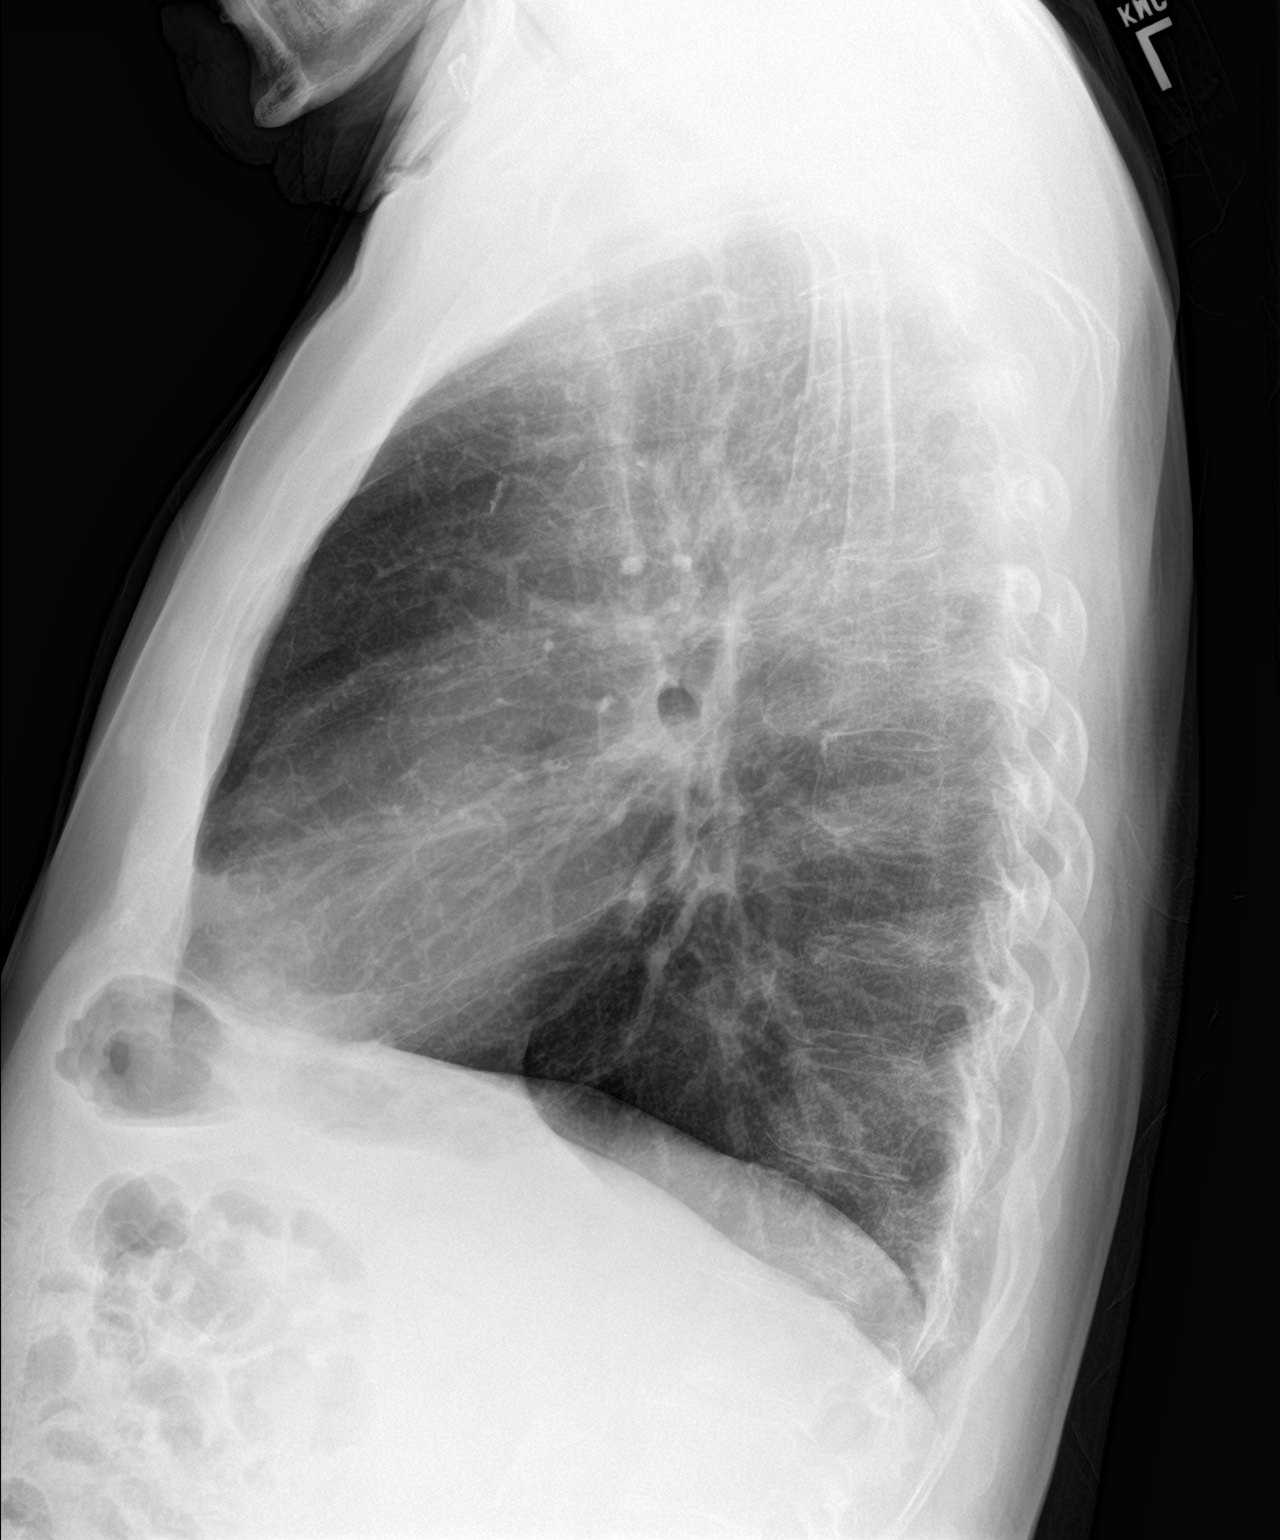

[chest pa]
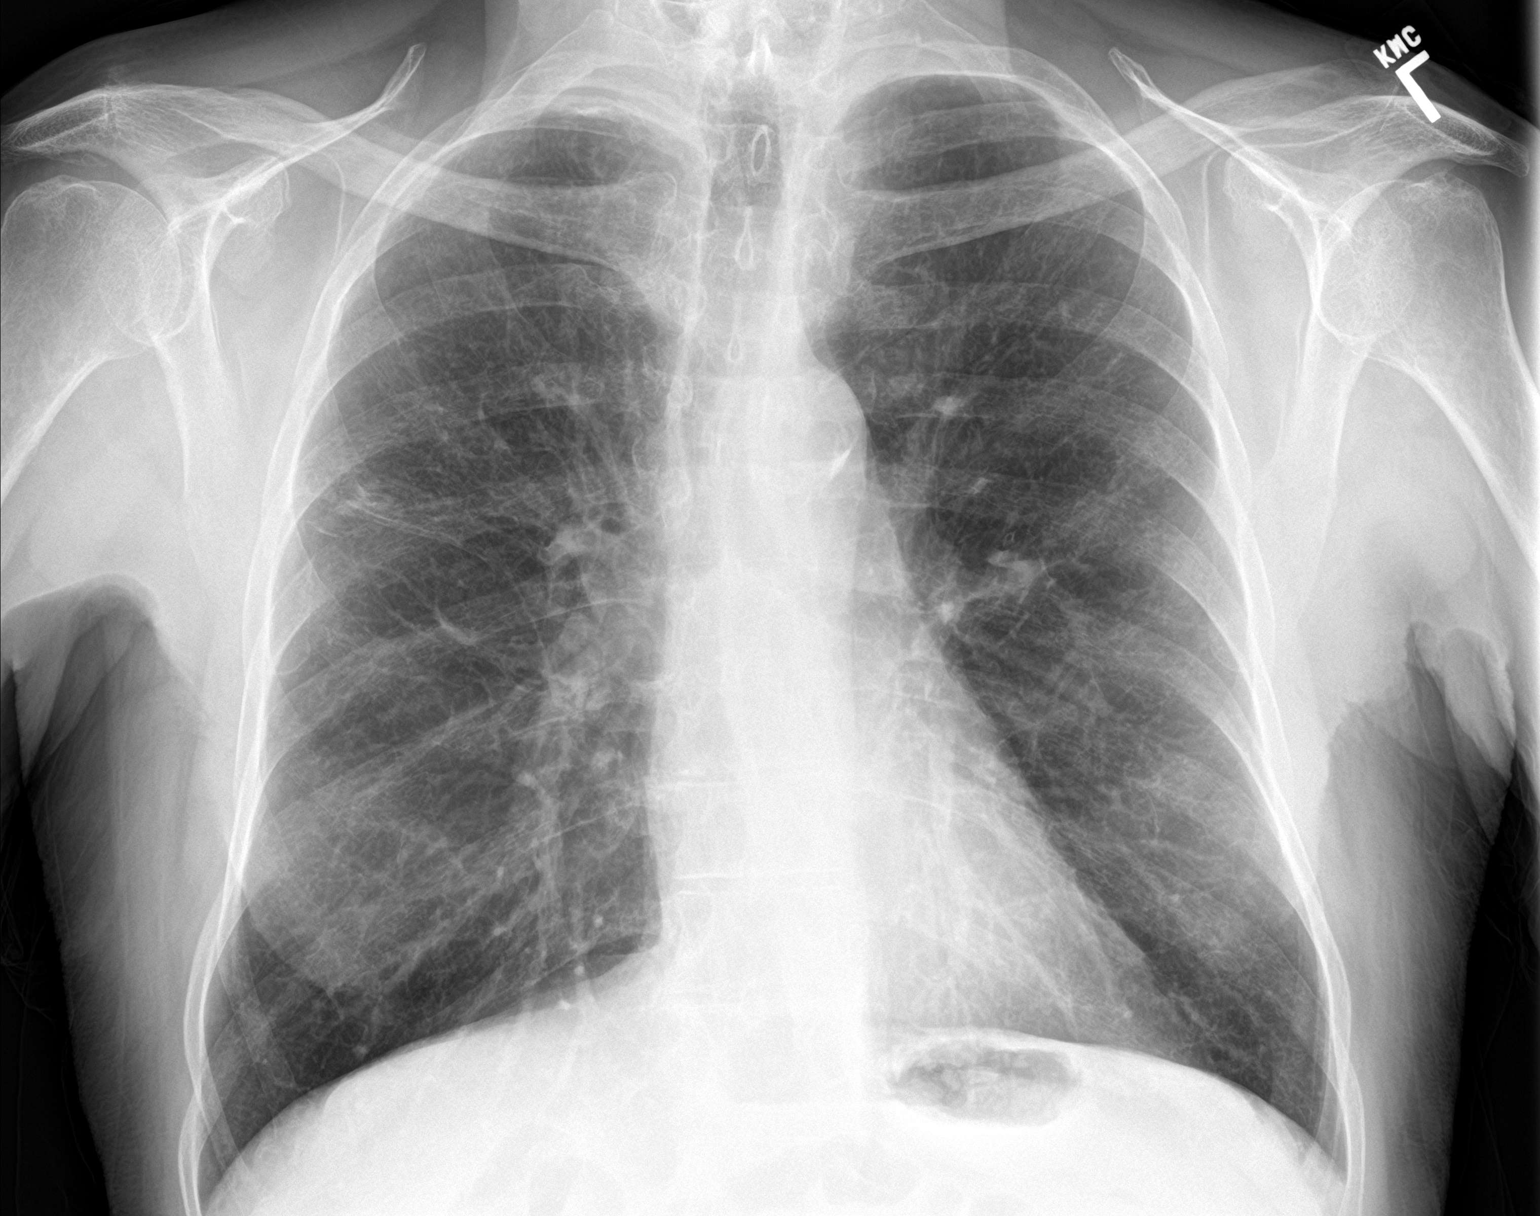

[2 of 2 positions shown; findings below may reference images not displayed]

FINDINGS: The cardiomediastinal silhouette is unremarkable.

COPD/emphysema changes again noted.

There is no evidence of focal airspace disease, pulmonary edema,
suspicious pulmonary nodule/mass, pleural effusion, or pneumothorax.

No acute bony abnormalities are identified.
IMPRESSION: COPD/emphysema without evidence of active cardiopulmonary disease.

## 2019-01-17 ENCOUNTER — Other Ambulatory Visit (INDEPENDENT_AMBULATORY_CARE_PROVIDER_SITE_OTHER): Payer: Medicare HMO

## 2019-01-17 DIAGNOSIS — E538 Deficiency of other specified B group vitamins: Secondary | ICD-10-CM | POA: Diagnosis not present

## 2019-01-17 DIAGNOSIS — N183 Chronic kidney disease, stage 3 unspecified: Secondary | ICD-10-CM

## 2019-01-17 DIAGNOSIS — E559 Vitamin D deficiency, unspecified: Secondary | ICD-10-CM

## 2019-01-17 DIAGNOSIS — R7302 Impaired glucose tolerance (oral): Secondary | ICD-10-CM | POA: Diagnosis not present

## 2019-01-17 DIAGNOSIS — E611 Iron deficiency: Secondary | ICD-10-CM

## 2019-01-17 LAB — HEPATIC FUNCTION PANEL
ALT: 11 U/L (ref 0–53)
AST: 12 U/L (ref 0–37)
Albumin: 4.2 g/dL (ref 3.5–5.2)
Alkaline Phosphatase: 56 U/L (ref 39–117)
Bilirubin, Direct: 0.1 mg/dL (ref 0.0–0.3)
Total Bilirubin: 0.4 mg/dL (ref 0.2–1.2)
Total Protein: 7.2 g/dL (ref 6.0–8.3)

## 2019-01-17 LAB — LIPID PANEL
Cholesterol: 127 mg/dL (ref 0–200)
HDL: 51.7 mg/dL (ref >39.00)
LDL Cholesterol: 64 mg/dL (ref 0–99)
NonHDL: 75.52
Total CHOL/HDL Ratio: 2
Triglycerides: 57 mg/dL (ref 0.0–149.0)
VLDL: 11.4 mg/dL (ref 0.0–40.0)

## 2019-01-17 LAB — BASIC METABOLIC PANEL
BUN: 24 mg/dL — ABNORMAL HIGH (ref 6–23)
CO2: 27 mEq/L (ref 19–32)
Calcium: 9.4 mg/dL (ref 8.4–10.5)
Chloride: 104 mEq/L (ref 96–112)
Creatinine, Ser: 1.52 mg/dL — ABNORMAL HIGH (ref 0.40–1.50)
GFR: 44.58 mL/min — ABNORMAL LOW (ref >60.00)
Glucose, Bld: 75 mg/dL (ref 70–99)
Potassium: 4.5 mEq/L (ref 3.5–5.1)
Sodium: 141 mEq/L (ref 135–145)

## 2019-01-17 LAB — IBC PANEL
Iron: 46 ug/dL (ref 42–165)
Saturation Ratios: 12.4 % — ABNORMAL LOW (ref 20.0–50.0)
Transferrin: 265 mg/dL (ref 212.0–360.0)

## 2019-01-17 LAB — VITAMIN D 25 HYDROXY (VIT D DEFICIENCY, FRACTURES): VITD: 28.08 ng/mL — ABNORMAL LOW (ref 30.00–100.00)

## 2019-01-17 LAB — VITAMIN B12: Vitamin B-12: 200 pg/mL — ABNORMAL LOW (ref 211–911)

## 2019-01-17 LAB — HEMOGLOBIN A1C: Hgb A1c MFr Bld: 6.2 % (ref 4.6–6.5)

## 2019-01-23 DIAGNOSIS — J9611 Chronic respiratory failure with hypoxia: Secondary | ICD-10-CM | POA: Diagnosis not present

## 2019-01-23 DIAGNOSIS — J449 Chronic obstructive pulmonary disease, unspecified: Secondary | ICD-10-CM | POA: Diagnosis not present

## 2019-01-30 ENCOUNTER — Encounter: Payer: Self-pay | Admitting: Internal Medicine

## 2019-01-30 ENCOUNTER — Ambulatory Visit (INDEPENDENT_AMBULATORY_CARE_PROVIDER_SITE_OTHER): Payer: Medicare HMO | Admitting: Internal Medicine

## 2019-01-30 ENCOUNTER — Other Ambulatory Visit: Payer: Self-pay

## 2019-01-30 VITALS — BP 126/78 | HR 90 | Temp 97.9°F | Ht 67.0 in | Wt 174.0 lb

## 2019-01-30 DIAGNOSIS — Z0000 Encounter for general adult medical examination without abnormal findings: Secondary | ICD-10-CM

## 2019-01-30 DIAGNOSIS — I10 Essential (primary) hypertension: Secondary | ICD-10-CM

## 2019-01-30 DIAGNOSIS — J449 Chronic obstructive pulmonary disease, unspecified: Secondary | ICD-10-CM | POA: Diagnosis not present

## 2019-01-30 DIAGNOSIS — R7302 Impaired glucose tolerance (oral): Secondary | ICD-10-CM

## 2019-01-30 DIAGNOSIS — E785 Hyperlipidemia, unspecified: Secondary | ICD-10-CM

## 2019-01-30 DIAGNOSIS — N183 Chronic kidney disease, stage 3 unspecified: Secondary | ICD-10-CM

## 2019-01-30 DIAGNOSIS — E538 Deficiency of other specified B group vitamins: Secondary | ICD-10-CM

## 2019-01-30 DIAGNOSIS — E559 Vitamin D deficiency, unspecified: Secondary | ICD-10-CM | POA: Diagnosis not present

## 2019-01-30 MED ORDER — VITAMIN D (ERGOCALCIFEROL) 1.25 MG (50000 UNIT) PO CAPS: 50000.0000 [IU] | ORAL_CAPSULE | ORAL | 0 refills | Status: DC

## 2019-01-30 MED ORDER — VITAMIN B-12 1000 MCG PO TABS: 1000.0000 ug | ORAL_TABLET | Freq: Every day | ORAL | 1 refills | Status: AC

## 2019-01-30 NOTE — Assessment & Plan Note (Addendum)
stable overall by history and exam, recent data reviewed with pt, and pt to continue medical treatment as before,  to f/u any worsening symptoms or concerns, f/u renal yearly as planned  I spent 32 minutes preparing to see the patient by review of recent labs, imaging and procedures, obtaining and reviewing separately obtained history, communicating with the patient and family or caregiver, ordering medications, tests or procedures, and documenting clinical information in the EHR including the differential Dx, treatment, and any further evaluation and other management of CKD, hyperglycemia, HTn, HLD, vit D and b12 deficiency

## 2019-01-30 NOTE — Assessment & Plan Note (Signed)
For oral replacement 

## 2019-01-30 NOTE — Assessment & Plan Note (Signed)
stable overall by history and exam, recent data reviewed with pt, and pt to continue medical treatment as before,  to f/u any worsening symptoms or concerns  

## 2019-01-30 NOTE — Progress Notes (Signed)
Subjective:    Patient ID: Justin Walters, male    DOB: [birth year], 79 y.o.   MRN: [id number]  HPI  Here to f/u; overall doing ok,  Pt denies chest pain, increasing sob or doe, wheezing, orthopnea, PND, increased LE swelling, palpitations, dizziness or syncope.  Pt denies new neurological symptoms such as new headache, or facial or extremity weakness or numbness.  Pt denies polydipsia, polyuria, or low sugar episode.  Pt states overall good compliance with meds, mostly trying to follow appropriate diet, with wt overall stable,  but little exercise however.  Not taking any otc b12 or vit d. No new complaints Past Medical History:  Diagnosis Date  . At risk for sleep apnea    STOP-BANG= 5    SENT TO PCP 06-13-2013  . Bladder stone   . BPH (benign prostatic hyperplasia)   . COPD (chronic obstructive pulmonary disease) ([place])   . Diverticulosis of colon   . Dysuria   . Glucose intolerance (impaired glucose tolerance)   . History of kidney stones   . Hyperlipidemia   . Hypertension   . Ureteral disorder 2018   ureteral repair   Past Surgical History:  Procedure Laterality Date  . APPENDECTOMY  1980's  . CATARACT EXTRACTION W/ INTRAOCULAR LENS  IMPLANT, BILATERAL    . COLONOSCOPY  10-05-2003   tics only   . CYSTOSCOPY N/A 06/23/2013   Procedure: CYSTOSCOPY WITH LITHOLAPAXY, BLADDER EXPLORATION, REPAIR BLADDER PERFORATION;  Surgeon: Arvil Persons, MD;  Location: [facility];  Service: Urology;  Laterality: N/A;  . CYSTOSCOPY/URETEROSCOPY/HOLMIUM LASER/STENT PLACEMENT Left 04/03/2017   Procedure: CYSTOSCOPY LEFT RETROGRADE/LEFT /URETEROSCOPY/HOLMIUM LASER/BASKET STONE EXTRACTION/STENT PLACEMENT/ URETHRAL BIOPSY;  Surgeon: [name], MD;  Location: [facility];  Service: Urology;  Laterality: Left;  . EXTRACORPOREAL SHOCK WAVE LITHOTRIPSY Left 10/23/2016   Procedure: LEFT EXTRACORPOREAL SHOCK WAVE LITHOTRIPSY (ESWL);  Surgeon: Nickie Retort, MD;  Location: [facility];  Service:  Urology;  Laterality: Left;  . EXTRACORPOREAL SHOCK WAVE LITHOTRIPSY Left 01/08/2017   Procedure: LEFT EXTRACORPOREAL SHOCK WAVE LITHOTRIPSY (ESWL);  Surgeon: [name], MD;  Location: [facility];  Service: Urology;  Laterality: Left;  . HOLMIUM LASER APPLICATION N/A [id number]   Procedure: HOLMIUM LASER APPLICATION;  Surgeon: Arvil Persons, MD;  Location: [facility];  Service: Urology;  Laterality: N/A;  . LAPAROSCOPIC INGUINAL HERNIA REPAIR Bilateral 10-09-2003  . TRANSURETHRAL RESECTION OF PROSTATE N/A 06/23/2013   Procedure: TRANSURETHRAL RESECTION OF THE PROSTATE;  Surgeon: Arvil Persons, MD;  Location: [facility];  Service: Urology;  Laterality: N/A;    reports that he quit smoking about 26 years ago. His smoking use included cigarettes. He has a 102.00 pack-year smoking history. He has never used smokeless tobacco. He reports that he does not drink alcohol or use drugs. family history includes COPD in his mother; Cancer in his father; Emphysema in his brother; Heart failure in his brother. No Known Allergies Current Outpatient Medications on File Prior to Visit  Medication Sig Dispense Refill  . acetaminophen (TYLENOL) 325 MG tablet Take 650 mg by mouth every 6 (six) hours as needed for mild pain or headache.    . albuterol (PROVENTIL HFA;VENTOLIN HFA) 108 (90 Base) MCG/ACT inhaler INHALE 2 PUFFS INTO THE LUNGS EVERY 6 (SIX) HOURS AS NEEDED FOR WHEEZING OR SHORTNESS OF BREATH. 54 g 3  . albuterol (PROVENTIL) (2.5 MG/3ML) 0.083% nebulizer solution Take 3 mLs (2.5 mg total) by nebulization every 4 (four) hours as needed  for wheezing or shortness of breath. 120 mL 11  . aspirin 81 MG EC tablet Take 1 tablet (81 mg total) by mouth daily. Swallow whole. 30 tablet 12  . Fluticasone-Umeclidin-Vilant (TRELEGY ELLIPTA) 100-62.5-25 MCG/INH AEPB Inhale 1 puff into the lungs daily. 1 each 11  . gabapentin (NEURONTIN) 300 MG capsule TAKE 1 CAPSULE THREE TIMES DAILY 270  capsule 1  . pantoprazole (PROTONIX) 40 MG tablet TAKE 1 TABLET BY MOUTH ONCE DAILY .  TAKE  30-60  MINUTES  BEFORE  FIRST  MEAL  OF  THE  DAY 90 tablet 1  . predniSONE (DELTASONE) 10 MG tablet Prednisne 10 mg  X 2 each am until better, then 1 daily x 3 days then one half daily until return to office 100 tablet 0  . Respiratory Therapy Supplies (FLUTTER) DEVI Use as directed 1 each 0  . rosuvastatin (CRESTOR) 20 MG tablet TAKE 1 TABLET EVERY DAY 90 tablet 1  . Saccharomyces boulardii (PROBIOTIC) 250 MG CAPS Take 1 capsule by mouth daily.    . tamsulosin (FLOMAX) 0.4 MG CAPS capsule Take 0.4 mg by mouth daily.    . diclofenac sodium (VOLTAREN) 1 % GEL Apply 2 g topically 4 (four) times daily. (Patient not taking: Reported on 01/30/2019) 200 g 5  . famotidine (PEPCID) 20 MG tablet Take 20 mg by mouth at bedtime.    . Guaifenesin (MUCINEX MAXIMUM STRENGTH) 1200 MG TB12 Take 1 tablet by mouth 2 (two) times daily.    Marland Kitchen OVER THE COUNTER MEDICATION Take 2 capsules by mouth at bedtime. Vicks "Zzzs" capsule for sleep.     No current facility-administered medications on file prior to visit.   Review of Systems All otherwise neg per pt     Objective:   Physical Exam BP 126/78   Pulse 90   Temp 97.9 F (36.6 C)   Ht 5\' 7"  (1.702 m)   Wt 174 lb (78.9 kg)   SpO2 99%   BMI 27.25 kg/m  VS noted,  Constitutional: Pt appears in NAD HENT: Head: NCAT.  Right Ear: External ear normal.  Left Ear: External ear normal.  Eyes: . Pupils are equal, round, and reactive to light. Conjunctivae and EOM are normal Nose: without d/c or deformity Neck: Neck supple. Gross normal ROM Cardiovascular: Normal rate and regular rhythm.   Pulmonary/Chest: Effort normal and breath sounds without rales or wheezing.  Abd:  Soft, NT, ND, + BS, no organomegaly Neurological: Pt is alert. At baseline orientation, motor grossly intact Skin: Skin is warm. No rashes, other new lesions, no LE edema Psychiatric: Pt behavior is  normal without agitation  All otherwise neg per pt Lab Results  Component Value Date   WBC 5.8 07/17/2018   HGB 12.7 (L) 07/17/2018   HCT 38.8 (L) 07/17/2018   PLT 205.0 07/17/2018   GLUCOSE 75 01/17/2019   CHOL 127 01/17/2019   TRIG 57.0 01/17/2019   HDL 51.70 01/17/2019   LDLDIRECT 161.9 03/28/2012   LDLCALC 64 01/17/2019   ALT 11 01/17/2019   AST 12 01/17/2019   NA 141 01/17/2019   K 4.5 01/17/2019   CL 104 01/17/2019   CREATININE 1.52 (H) 01/17/2019   BUN 24 (H) 01/17/2019   CO2 27 01/17/2019   TSH 2.91 07/17/2018   PSA 0.96 07/17/2018   HGBA1C 6.2 01/17/2019         Assessment & Plan:

## 2019-01-30 NOTE — Patient Instructions (Signed)
Please take Vitamin D 50000 units weekly for 12 weeks, then plan to change to OTC Vitamin D3 at 2000 units per day, indefinitely.  Please take all new medication as prescribed - the B12 - 1 pill per day  Please continue all other medications as before, and refills have been done if requested.  Please have the pharmacy call with any other refills you may need.  Please continue your efforts at being more active, low cholesterol diet, and weight control.  Please keep your appointments with your specialists as you may have planned  Please make an Appointment to return in 6 months, or sooner if needed, also with Lab Appointment for testing done 3-5 days before at the [facility] (so this is for TWO appointments - please see the scheduling desk as you leave)

## 2019-02-23 DIAGNOSIS — J9611 Chronic respiratory failure with hypoxia: Secondary | ICD-10-CM | POA: Diagnosis not present

## 2019-02-23 DIAGNOSIS — J449 Chronic obstructive pulmonary disease, unspecified: Secondary | ICD-10-CM | POA: Diagnosis not present

## 2019-03-02 ENCOUNTER — Ambulatory Visit: Payer: Medicare HMO

## 2019-03-02 DIAGNOSIS — J449 Chronic obstructive pulmonary disease, unspecified: Secondary | ICD-10-CM | POA: Diagnosis not present

## 2019-03-09 ENCOUNTER — Ambulatory Visit: Payer: Medicare HMO | Attending: Internal Medicine

## 2019-03-09 DIAGNOSIS — Z23 Encounter for immunization: Secondary | ICD-10-CM | POA: Insufficient documentation

## 2019-03-09 NOTE — Progress Notes (Signed)
   Covid-19 Vaccination Clinic  Name:  Justin Walters    MRN: [id number] DOB: 10-12-1940  03/09/2019  [name] was observed post Covid-19 immunization for 15 minutes without incident. He was provided with Vaccine Information Sheet and instruction to access the V-Safe system.   [name] was instructed to call 911 with any severe reactions post vaccine: Marland Kitchen Difficulty breathing  . Swelling of face and throat  . A fast heartbeat  . A bad rash all over body  . Dizziness and weakness   Immunizations Administered    Name Date Dose VIS Date Route   Pfizer COVID-19 Vaccine 03/09/2019  8:11 AM 0.3 mL 12/13/2018 Intramuscular   Manufacturer: [place]   Lot: [id number]   [place]: [id number]

## 2019-03-10 ENCOUNTER — Ambulatory Visit: Payer: Medicare HMO | Admitting: Internal Medicine

## 2019-03-10 ENCOUNTER — Ambulatory Visit (INDEPENDENT_AMBULATORY_CARE_PROVIDER_SITE_OTHER): Payer: Medicare HMO

## 2019-03-10 ENCOUNTER — Other Ambulatory Visit: Payer: Self-pay

## 2019-03-10 ENCOUNTER — Encounter: Payer: Self-pay | Admitting: Internal Medicine

## 2019-03-10 DIAGNOSIS — R0600 Dyspnea, unspecified: Secondary | ICD-10-CM

## 2019-03-10 DIAGNOSIS — J9611 Chronic respiratory failure with hypoxia: Secondary | ICD-10-CM

## 2019-03-10 DIAGNOSIS — R0609 Other forms of dyspnea: Secondary | ICD-10-CM

## 2019-03-10 DIAGNOSIS — J449 Chronic obstructive pulmonary disease, unspecified: Secondary | ICD-10-CM | POA: Diagnosis not present

## 2019-03-10 DIAGNOSIS — R0602 Shortness of breath: Secondary | ICD-10-CM | POA: Diagnosis not present

## 2019-03-10 LAB — BASIC METABOLIC PANEL
BUN: 22 mg/dL (ref 6–23)
CO2: 30 mEq/L (ref 19–32)
Calcium: 9.4 mg/dL (ref 8.4–10.5)
Chloride: 104 mEq/L (ref 96–112)
Creatinine, Ser: 1.65 mg/dL — ABNORMAL HIGH (ref 0.40–1.50)
GFR: 40.54 mL/min — ABNORMAL LOW (ref >60.00)
Glucose, Bld: 101 mg/dL — ABNORMAL HIGH (ref 70–99)
Potassium: 5 mEq/L (ref 3.5–5.1)
Sodium: 140 mEq/L (ref 135–145)

## 2019-03-10 LAB — CBC WITH DIFFERENTIAL/PLATELET
Basophils Absolute: 0.1 10*3/uL (ref 0.0–0.1)
Basophils Relative: 0.6 % (ref 0.0–3.0)
Eosinophils Absolute: 0.2 10*3/uL (ref 0.0–0.7)
Eosinophils Relative: 2.1 % (ref 0.0–5.0)
HCT: 40.2 % (ref 39.0–52.0)
Hemoglobin: 13.1 g/dL (ref 13.0–17.0)
Lymphocytes Relative: 11.3 % — ABNORMAL LOW (ref 12.0–46.0)
Lymphs Abs: 1.1 10*3/uL (ref 0.7–4.0)
MCHC: 32.6 g/dL (ref 30.0–36.0)
MCV: 85.2 fl (ref 78.0–100.0)
Monocytes Absolute: 0.8 10*3/uL (ref 0.1–1.0)
Monocytes Relative: 8.5 % (ref 3.0–12.0)
Neutro Abs: 7.7 10*3/uL (ref 1.4–7.7)
Neutrophils Relative %: 77.5 % — ABNORMAL HIGH (ref 43.0–77.0)
Platelets: 204 10*3/uL (ref 150.0–400.0)
RBC: 4.72 Mil/uL (ref 4.22–5.81)
RDW: 16.1 % — ABNORMAL HIGH (ref 11.5–15.5)
WBC: 9.9 10*3/uL (ref 4.0–10.5)

## 2019-03-10 LAB — SEDIMENTATION RATE: Sed Rate: 12 mm/hr (ref 0–20)

## 2019-03-10 LAB — BRAIN NATRIURETIC PEPTIDE: Pro B Natriuretic peptide (BNP): 23 pg/mL (ref 0.0–100.0)

## 2019-03-10 MED ORDER — PREDNISONE 10 MG PO TABS: ORAL_TABLET | ORAL | 0 refills | Status: DC

## 2019-03-10 NOTE — Progress Notes (Signed)
Subjective:     Patient ID: Justin Walters, male   DOB: 02-Nov-1940,     MRN: [id number]    Brief patient profile:  79 yowm MM quit smoking 1995 with cough/wheeze/ sob and never recovered with documented GOLD III copd 02/23/2017      History of Present Illness  01/25/2017 1st  office visit/ [name]   GOLD III with reversibility  Chief Complaint  Patient presents with  . Pulmonary Consult    Referred by the hospital. He c/o SOB and coughing for the past several wks. His cough is occ prod, but he is unsure of sputum color. He is SOB with exertion and also when he lies down.  He gets winded walking approx 100 yards. He is using his albuterol inhaler 3 x per wk on average.   baseline doe across the street to son's house  nl pace and then onset   x one year gradually worse doe and no better on inhalers but poor baseline hfa/ assoc with cough / sense of chest congestion and has to prop up at 30 degrees now at hs to get comfortable Present doe = MMRC3 = can't walk 100 yards even at a slow pace at a flat grade s stopping due to sob  Easily confused with details of care/ names of meds / not using spiriva/ was better on pred and worse since tapered rec Plan A = Automatic =  symbicort 160 Take 2 puffs first thing in am and then another 2 puffs about 12 hours later.  Work on inhaler technique:  Plan B = Backup Only use your albuterol  as a rescue  Prednisone 10 mg take  4 each am x 2 days,   2 each am x 2 days,  1 each am x 2 days and stop     02/23/2017  f/u ov/[name] re:  GOLD III copd / no 02 Chief Complaint  Patient presents with  . Follow-up    PFT's today.  Breathing has improved some. He is rarely using his albuterol inhaler. He has been feeling fatigued.    better p prednisone then gradually worse  Dyspnea:  MMRC2 = can't walk a nl pace on a flat grade s sob but does fine slow and flat  Cough: none Sleep: ok 30 degrees no am congestion rec Add tudorza one puff after symbicort in am and in pm   Work on inhaler technique:      05/24/2017  f/u ov/[name] re:  Copd III/ no 02  Just on maint symb 160 / could not afford tudorza and not sure it helped doe  Chief Complaint  Patient presents with  . Follow-up    Breathing is doing well. He rarely uses his albuterol inhaler. No new co's.   Dyspnea:  MMRC2 = can't walk a nl pace on a flat grade s sob but does fine slow pace pretty much anywhere he wants to go  Cough: none Sleep: ok flat SABA use:  Rare  Hb every hs x sev months / resolves p about an hour, better p sit up / no assoc dysphagia or daytime symptoms/ varies some with how early he eats supper and how much, not sure what type meals make it worse  rec GERD diet  If not better add pepcid ac 20 mg one hour before bed > did not  Do   10/31/17 NP eval for 3-4 weeks worse sob/ cough rec Xopenex in office  Depo 80 Prednisone 10mg  tablet  >>>  4 tabs for 2 days, then 3 tabs for 2 days, 2 tabs for 2 days, then 1 tab for 2 days, then sto Azithromycin 250mg  tablet  >>>Take 2 tablets (500mg  total) today, and then 1 tablet (250mg ) for the next four days      11/21/2017 acute extended ov/[name] re: refractory cough x early oct 2019  / no change sob  Chief Complaint  Patient presents with  . Acute Visit    pt c/o sinus congestion, pnd, prod cough with white mucus X1 month.  has had 2 rounds of pred and doxy with little relief.   dyspnea : MMRC2 = can't walk a nl pace on a flat grade s sob but does fine slow and flat= baseline when not coughing  Cough > white mucus  not worse in am's/ harsh daytime coughing fts Sleeping flat two pillow saba  Not at all now rec Plan A = Automatic = symbicort 160 Take 2 puffs first thing in am and then another 2 puffs about 12 hours later.  Whenever your cough flares > try  Try prilosec otc 20mg   Take 30-60 min before first meal of the day and Pepcid ac (famotidine) 20 mg one after hour after supper or at bedtime until cough is completely gone for at  least a week without the need for cough suppression Plan B = Backup Only use your albuterol inhaler as a rescue medication  goes up over your usual need - Don't leave home without it !!  (think of it like the spare tire for your car)  For cough Mucinex dm 1200 mg every 12 hours and the tylenol #3  Up to every 4 hours if need for persistent irritating coughing  GERD diet      12/05/2017 acute extended ov/[name] re: cough and sob worse since oc 2019 Chief Complaint  Patient presents with  . Acute Visit    Increased SOB, cough and chest congestion x 1 month. Sputum is white to yellow. He is using his albuterol inhaler 2 x per wk on average.   cough is worse p supper and fine on back with  flat bed/ no blocks  In am feels fine but groggy p benadryl - can't  sleep s it W/in 20 min of rising lots of pnds > white mucus  Not using mucinex dm / reversing ppi and h2  Technique on symb poor and not using saba correctly in terms or when to use  rec Augmentin 875 mg take one pill twice daily  X 10 days - take at breakfast and supper with large glass of water.  .  Take prednisone  4 for three days 3 for three days 2 for three days 1 for three days and stop  Omeprazole 20 mg Take 30- 60 min before your first and last meals of the day and continue famotidine 20 mg at bedtime Bed blocks would be a great idea Try off zquil and tylenol #3  Up to 2 at bedtime For cough > mucinex dm 1200 mg every 12 hours and use the flutter valve as much as possible and if still can't stop coughing then use the tylenol #3        12/20/2017  f/u ov/[name] re:  Cough better, doe not quite baseline on symb 160 2bid  No worse off pred Chief Complaint  Patient presents with  . Follow-up    Breathing has improved some but is not back to his normal baseline. He rarely uses  his albuterol inhaler.   Dyspnea:  MMRC2 = can't walk a nl pace on a flat grade s sob but does fine slow and flat  Cough: better/ no mucus production at all   Sleeping: ok flat  SABA use: rarely 02: none   rec Bed blocks 6-8 inches  For cough > Mucinex dm up to 1200 mg twice daily and use the flutter valve as much as you can Gabapentin should be three times daily  Omeprazole is Take 30-60 min before first meal of the day and pepcid ac 20 mg after supper     01/25/2018 acute extended ov/Dalessandro Baldyga re:  Copd III / stopped symb too expensive, does better with nebs / overall not doing well since early Oct 2019  Chief Complaint  Patient presents with  . Acute Visit    SOB, runny nose, ear pain, wheezing, cough- white mucus at times, denies fever   brought meds/ not using ppi as instructed and ? If really using flutter, doesn't think it helps but did not bring it as rec to show him how to use/ using halls cough drops with menthol which was discouraged last ov/ gradually worse doe/ subj wheeze x sev weeks no better with hfa but technique poor/ no purulent mucus/ cough worse at hs and in am. No better on prednisone  rec Duoneb (albuterol and atrovent)  4 x daily Only use your albuterol as a rescue medication   Work on inhaler technique:  Be sure you take prilosec Take 30- 60 min before your first and last meals of the day  Change your appt to  - add sinus ct / hrct chest if still coughing on return      02/22/2018  f/u ov/Mckenize Mezera re: COPD  GOLD III maint on duoneb tid  Chief Complaint  Patient presents with  . Follow-up    Increased SOB, wheezing, and cough- non over the past 3-4 wks. He is using his albuterol inhaler 3-4 x per day and neb 3 x per day.   Dyspnea:  Still does [employer] with hc parking no 02  Cough: p supper is the worst but never prod Sleeping: bed blocks/ wakes up tight in chest around 2 am p last neb at 7 pm  SABA use: as above  rec Plan A = Automatic = Symbicort 160 Take 2 puffs first thing in am and then another 2 puffs about 12 hours later and duoneb four times   Work on inhaler technique:   Plan B = Backup Only use your  albuterol inhaler as a rescue medication Prednisone 10 mg  Take 4 for three days 3 for three days 2 for three days 1 for three days and stop  Please see patient coordinator before you leave today  to schedule 02 2lpm 24/7 for now Please remember to go to the  x-ray department  for your tests - we will call you with the results when they are available   Please schedule a follow up office visit in 2 weeks, sooner if needed  with all medications /inhalers/ solutions in hand so we can verify exactly what you are taking. This includes all medications from all doctors and over the counters and bring your DRUG FORMULARY  - consider change coreg to bisoprolol or at least bystolic samples short run  - add levaquin 500 mg x 10 d for ? pna    03/08/2018  f/u ov/Dennie Moltz re:  COPD  III  On symb and duoneb qid  Chief Complaint  Patient presents with  . Follow-up    Breathing has improved back to baseline. He is not using his albuterol inhaler but is using his duoneb 4 x daily.   Dyspnea:  Better, usually does [employer] leaning on cart s 02  Cough: none now Sleeping: bed blocks  SABA use: none extra in hfa form  02: 2lpm at hs not with activity  rec Plan A = Automatic = Incruse/BREO  one click eam am and continue 02 2 lpm at bedtime and wear it  as needed during the day to keep your sats > 90% with walking or go slower Plan B = Backup Only use your albuterol inhaler as a rescue medication  Plan C = Crisis - only use your albuterol nebulizer(not your ipatropium)  if you first try Plan B and it fails to help > ok to use the nebulizer up to every 4 hours but if start needing it regularly call for immediate appointment Please remember to go to the  x-ray department  for your tests - we will call you with the results when they are available   Please schedule a follow up office visit in 2 weeks with your formulary , sooner if needed  with all medications /inhalers/ solutions in hand so we can verify exactly what  you are taking. This includes all medications from all doctors and over the counters (ok to see NP if I don't have opening )    03/25/2018  f/u ov/Katalyna Socarras re:  COPD III overall worse since Oct 2019, esp since Feb 02 2018 Chief Complaint  Patient presents with  . Follow-up    Breathing has been progressively worse since the last visit. He is using his rescue inhaler daily and neb 4 x daily.   Dyspnea:  Across the room on 3lpm  Cough: turned green x sev days prior to OV / sometimes coughs so hard hurts in chest (ant/ generalized)  but not using flutter as rec   Sleeping: bed blocks does fine most nocts SABA use: as above  02:  3lpm 24/7  rec Change BREO / Incruse to Anoro click x one and take two good drags (hold with the R hand) Prednisone 10 mg take  4 each am x 2 days,   2 each am x 2 days,  1 each am x 2 days and stop  Augmentin 875 mg take one pill twice daily  X 10 days -  Plan B = Backup Only use your albuterol inhaler as a rescue medication Plan C = Crisis - only use your albuterol nebulizer(NOT your ipatropium/albuterol)  if you first try Plan B Add protonix 40 mg Take 30-60 min before first meal of the day  The goal with 02 is to keep your 02 saturations over 90% at all times   Admit date: 03/26/2018 Discharge date: 03/28/2018  Admitted From: Home Disposition: Home  Brief/Interim Summary:78 y.o.malewith a history of3L O2-dependent COPD , HTN, HLD, nephrolithiasis s/p ESWL 2019, BPH with LUTS who presented with acute dyspnea and wheezing. He reports 1-2 weeks of increased, intermittent, moderate wheezing improved with home nebulizer treatments, as well as slight increase in his chronic cough in that it is more productive, reported blue-green.Was evaluated yesterday by pulmonology, CXR showed infiltrate, started on augmentin and prednisone which he hasn't taken yet.He lives with his daughter who works in [employer] and has continued working, but isn't ill.  ED  Course:Initially in significant respiratory distress requiring BiPAP, given  nebs, solumedrol. Temp 100.66F, tachypneic, tachycardic. WBC 123XX123 with neutrophilic predominance, lymphs 1.1k, creatinine 1.31, platelets 577, hgb 11. Flu negative, covid assay sent.Respiratory effort has improved, but has not returned to baseline, so admission was requested.  Review of Systems:He denies fever, chills, has some chest tightness when coughing only. No leg swelling, orthopnea, PND. He denies abdominal pain, dysuria, or change in urinary symptoms but does have LUTS from BPH which are stable. Otherwiseper HPI. All others reviewed and are negative.     Discharge Diagnoses:  Principal Problem:   Acute on chronic respiratory failure with hypoxia and hypercapnia (HCC) Active Problems:   Hyperlipidemia   Essential hypertension   CKD (chronic kidney disease), stage III (HCC)   BPH (benign prostatic hyperplasia)   RENAL CALCULUS, HX OF   COPD exacerbation (HCC)   GERD (gastroesophageal reflux disease)   #1 acute on chronic hypoxic hypercapnic respiratory failure secondary to COPD exacerbation and possible pneumonia viral versus bacterial. Patient is on 3 L of oxygen prior to admission to the hospital. He lives at home with his daughter. His daughter works in [employer] and is continuing to work.  Family is able to quarantine him at home for 14 days or till COVID-19 results come back.  I will discharge him on Augmentin for 4 more days along with prednisone taper.  He was MRSA PCR negative.  Continue MDI as he was doing at home.. Chest x-ray 23 2020 shows hyperexpanded lungs with patchy airspace opacity at the bases likely due to pneumonia, less coarse interstitial thickening and patchy airspace opacity in the upper and mid lungs. Reticular interstitial disease, possible fibrosis. Strep pneumo negative. Covid 19 neg pcr  #2 renal stones with pyuria patient asymptomatic.  #3 GERD continue  Pepcid  #4 hypertension risk and Coreg were stopped due to soft blood pressure this may be restarted as an outpatient anytime as appropriate.    06/06/2018  f/u ov/Juancarlos Crescenzo re: post hosp off norvasc/ coreg(low bp) and much better since d/c and still on Anoro  Chief Complaint  Patient presents with  . Follow-up    Patient states that he's doing better. He has not had to use his rescue inhaler since last visit.   Dyspnea:  300 yards s stopping to son's house s 02 -  does not check sats Cough: minimal non productive Sleeping: on blocks x 6 in  SABA use: none  02: not using  rec Monitor your 02 saturations toward the end of your activity and try to build it back up to 30 min target with 02 sats over 90% is the target  No change in inhalers    12/20/2018 rec change anoro to trelegy and taper pred to 5 mg qod floor    03/10/2019  f/u ov/Zackory Pudlo re: COPD III / maint trelegy and off prednisone x one month (instructions were to maintain floor)  Chief Complaint  Patient presents with  . Follow-up    Increased SOB over the past month and also c/o wheezing. He rarely uses his albuterol inhaler or neb.   Dyspnea:  Still walking to son's house s stopping but feels more sob  Cough: min mucoid, worse off prednisone  Sleeping: 6 in bed blocks does fine  SABA use: none 02: has it not using/ not checking sats   No obvious day to day or daytime variability or assoc excess/ purulent sputum or mucus plugs or hemoptysis or cp or chest tightness, subjective wheeze or overt sinus or hb symptoms.  sleeping without nocturnal  or early am exacerbation  of respiratory  c/o's or need for noct saba. Also denies any obvious fluctuation of symptoms with weather or environmental changes or other aggravating or alleviating factors except as outlined above   No unusual exposure hx or h/o childhood pna/ asthma or knowledge of premature birth.  Current Allergies, Complete Past Medical History, Past Surgical History,  Family History, and Social History were reviewed in [facility] record.  ROS  The following are not active complaints unless bolded Hoarseness, sore throat, dysphagia, dental problems, itching, sneezing,  nasal congestion or discharge of excess mucus or purulent secretions, ear ache,   fever, chills, sweats, unintended wt loss or wt gain, classically pleuritic or exertional cp,  orthopnea pnd or arm/hand swelling  or leg swelling, presyncope, palpitations, abdominal pain, anorexia, nausea, vomiting, diarrhea  or change in bowel habits or change in bladder habits, change in stools or change in urine, dysuria, hematuria,  rash, arthralgias, visual complaints, headache, numbness, weakness or ataxia or problems with walking or coordination,  change in mood or  memory.        Current Meds  Medication Sig  . acetaminophen (TYLENOL) 325 MG tablet Take 650 mg by mouth every 6 (six) hours as needed for mild pain or headache.  . albuterol (PROVENTIL HFA;VENTOLIN HFA) 108 (90 Base) MCG/ACT inhaler INHALE 2 PUFFS INTO THE LUNGS EVERY 6 (SIX) HOURS AS NEEDED FOR WHEEZING OR SHORTNESS OF BREATH.  Marland Kitchen albuterol (PROVENTIL) (2.5 MG/3ML) 0.083% nebulizer solution Take 3 mLs (2.5 mg total) by nebulization every 4 (four) hours as needed for wheezing or shortness of breath.  Marland Kitchen aspirin 81 MG EC tablet Take 1 tablet (81 mg total) by mouth daily. Swallow whole.  . diclofenac sodium (VOLTAREN) 1 % GEL Apply 2 g topically 4 (four) times daily.  . diphenhydrAMINE (BENADRYL) 25 MG tablet Take 25 mg by mouth every 6 (six) hours as needed.  . famotidine (PEPCID) 20 MG tablet Take 20 mg by mouth at bedtime.  . Fluticasone-Umeclidin-Vilant (TRELEGY ELLIPTA) 100-62.5-25 MCG/INH AEPB Inhale 1 puff into the lungs daily.  Marland Kitchen gabapentin (NEURONTIN) 300 MG capsule TAKE 1 CAPSULE THREE TIMES DAILY  . Guaifenesin (MUCINEX MAXIMUM STRENGTH) 1200 MG TB12 Take 1 tablet by mouth 2 (two) times daily.  . pantoprazole  (PROTONIX) 40 MG tablet TAKE 1 TABLET BY MOUTH ONCE DAILY .  TAKE  30-60  MINUTES  BEFORE  FIRST  MEAL  OF  THE  DAY  . Respiratory Therapy Supplies (FLUTTER) DEVI Use as directed  . rosuvastatin (CRESTOR) 20 MG tablet TAKE 1 TABLET EVERY DAY  . Saccharomyces boulardii (PROBIOTIC) 250 MG CAPS Take 1 capsule by mouth daily.  . tamsulosin (FLOMAX) 0.4 MG CAPS capsule Take 0.4 mg by mouth daily.  . vitamin B-12 (CYANOCOBALAMIN) 1000 MCG tablet Take 1 tablet (1,000 mcg total) by mouth daily.  . Vitamin D, Ergocalciferol, (DRISDOL) 1.25 MG (50000 UNIT) CAPS capsule Take 1 capsule (50,000 Units total) by mouth every 7 (seven) days.                 Objective:   Physical Exam  amb wm nad    03/10/2019          178 09/10/2018          174  06/06/2018          171  03/25/2018       155  03/08/2018  149   01/25/2018       156  12/05/2017       157  11/21/2017     157  05/24/2017        164    01/25/17 150 lb 12.8 oz (68.4 kg)  01/18/17 150 lb (68 kg)  01/08/17 150 lb (68 kg)     Vital signs reviewed  03/10/2019  - Note at rest 02 sats  95% on RA      Has Upper partial plate   HEENT : pt wearing mask not removed for exam due to covid -19 concerns.    NECK :  without JVD/Nodes/TM/ nl carotid upstrokes bilaterally   LUNGS: no acc muscle use,  Mod barrel  contour chest wall with bilateral  Distant bs s audible wheeze and  without cough on insp or exp maneuvers and mod  Hyperresonant  to  percussion bilaterally     CV:  RRR  no s3 or murmur or increase in P2, and no edema   ABD:  soft and nontender with pos mid insp Hoover's  in the supine position. No bruits or organomegaly appreciated, bowel sounds nl  MS:     ext warm without deformities, calf tenderness, cyanosis or clubbing No obvious joint restrictions   SKIN: warm and dry without lesions    NEURO:  alert, approp, nl sensorium with  no motor or cerebellar deficits apparent.         CXR PA and Lateral:   03/10/2019 :    I  personally reviewed images and agree with radiology impression as follows:   Subtle, patchy areas of peripheral lung opacity less apparent and extensive than on the prior chest radiographs. These findings may be chronic due to areas of postinflammatory scarring.       Assessment:

## 2019-03-10 NOTE — Patient Instructions (Addendum)
Try albuterol (first day hfa/ next the neb) 15 min before an activity that you know would make you short of breath and see if it makes any difference and if makes none then don't take it after activity unless you can't catch your breath.  Make sure you check your oxygen saturations at highest level of activity to be sure it stays over 90% and adjust upward to maintain this level if needed but remember to turn it back to previous settings when you stop (to conserve your supply).    Please remember to go to the lab and x-ray department   for your tests - we will call you with the results when they are available.  If getting worse > prednisone 10 mg 2 daily until better, then one a day x 7 days, then one half daily x 7 days then one half daily      Please schedule a follow up visit in 3 months but call sooner if needed

## 2019-03-11 ENCOUNTER — Encounter: Payer: Self-pay | Admitting: Internal Medicine

## 2019-03-11 ENCOUNTER — Other Ambulatory Visit: Payer: Self-pay

## 2019-03-11 MED ORDER — GABAPENTIN 300 MG PO CAPS: ORAL_CAPSULE | ORAL | 1 refills | Status: DC

## 2019-03-11 NOTE — Progress Notes (Signed)
Spoke with pt and notified of results per Dr. Wert. Pt verbalized understanding and denied any questions. 

## 2019-03-11 NOTE — Assessment & Plan Note (Signed)
Quit smoking 1995 PFT's  01/20/2017  FEV1 1.05 (39 % ) ratio 54  p 18 % improvement from saba p ? prior to study with DLCO  50/53  % corrects to 77 % for alv volume  - 01/25/2017   rec increase symb to 160 2bid trial  - alpha one AT screening  01/18/17   MM  Level 190 PFT's  02/23/2017  FEV1 1.17 (44 % ) ratio 54  p 0 % improvement from saba p symb 160 prior to study with DLCO   39 % corrects to 59 % for alv volume  - 02/23/2017    add tudorza trial > no change and could not afford - 12/05/2017 flutter valve added   - 02/22/2018    > restart symb 160 2bid - 03/08/2018  After extensive coaching inhaler device,  effectiveness =    90% with elipta so try Anoro daily > improved as of 06/06/2018  - 12/05/2018 reported on televisit much worse everytime stops pred while on anoro so rec change to trelegy and pred 20 ceiling/ 5 mg floor until seen  - 12/20/2018 improved but did not start trelegy and taking anoro at hs > rec trelegy q am and stop anoro, reduce pred to floor of 5mg  qod > titrated complete off as of 02/2019 and gradually worse by ov 03/10/2019  - 03/10/2019 rec Prednisone as Plan D = 20 mg until better then taper off if tol     Group D in terms of symptom/risk and laba/lama/ICS  therefore appropriate rx at this point >>>  trelegy appropriate / ? pred dep  The goal with a chronic steroid dependent illness is always arriving at the lowest effective dose that controls the disease/symptoms and not accepting a set "formula" which is based on statistics or guidelines that don't always take into account patient  variability or the natural hx of the dz in every individual patient, which may well vary over time.  For now therefore I recommend the patient maintain prednisone as plan C above and taper off if tol

## 2019-03-11 NOTE — Assessment & Plan Note (Signed)
Qualified for 02 2019 01/25/2017 Patient Saturations on Room Air at Rest = 88% Patient Saturations on [employer] while Ambulating = 86% Patient Saturations on 2 Liters of oxygen while Ambulating = 91% - 02/22/2018 RA sats 88% at rest > resolved on 2lpm rec 24/7 02  - 03/08/2018   Walked RA  2 laps @  approx 216ft each @ slow pace  stopped due to  End of study / sats 88% p one lap and 84 p second so rec 2lpm with acitivities outside the house /titrate to keep > 90%   - as of 05/06/2018 using 2lpm 24/7 s desats walking   - as of 06/06/2018 no desats on RA walking  - as of 06/06/2018 d/c'd 02  - 03/10/2019   Walked RA x one lap =  approx 250 ft -@ moderate pace stopped due to fatigue > sob with sats still 91% so rec 02 prn sats < 90%    Advised:  Make sure you check your oxygen saturations at highest level of activity to be sure it stays over 90% and adjust upward to maintain this level if needed but remember to turn it back to previous settings when you stop (to conserve your supply).   Also: I spent extra time with pt today reviewing appropriate use of albuterol for prn use on exertion with the following points: 1) saba is for relief of sob that does not improve by walking a slower pace or resting but rather if the pt does not improve after trying this first. 2) If the pt is convinced, as many are, that saba helps recover from activity faster then it's easy to tell if this is the case by re-challenging : ie stop, take the inhaler, then p 5 minutes try the exact same activity (intensity of workload) that just caused the symptoms and see if they are substantially diminished or not after saba 3) if there is an activity that reproducibly causes the symptoms, try the saba 15 min before the activity on alternate days   If in fact the saba really does help, then fine to continue to use it prn but advised may need to look closer at the maintenance regimen being used to achieve better control of airways disease with  exertion.           Each maintenance medication was reviewed in detail including emphasizing most importantly the difference between maintenance and prns and under what circumstances the prns are to be triggered using an action plan format where appropriate.  Total time for H and P, chart review, counseling,  directly observing portions of ambulatory 02 saturation study/  and generating customized AVS unique to this office visit / charting = 30 min

## 2019-03-23 DIAGNOSIS — J449 Chronic obstructive pulmonary disease, unspecified: Secondary | ICD-10-CM | POA: Diagnosis not present

## 2019-03-23 DIAGNOSIS — J9611 Chronic respiratory failure with hypoxia: Secondary | ICD-10-CM | POA: Diagnosis not present

## 2019-03-30 DIAGNOSIS — J449 Chronic obstructive pulmonary disease, unspecified: Secondary | ICD-10-CM | POA: Diagnosis not present

## 2019-04-08 ENCOUNTER — Ambulatory Visit: Payer: Medicare HMO | Attending: Internal Medicine

## 2019-04-08 ENCOUNTER — Encounter: Payer: Self-pay | Admitting: Internal Medicine

## 2019-04-08 DIAGNOSIS — Z23 Encounter for immunization: Secondary | ICD-10-CM

## 2019-04-08 NOTE — Progress Notes (Signed)
   Covid-19 Vaccination Clinic  Name:  Justin Walters    MRN: [id number] DOB: [birth year]  04/08/2019  [name] was observed post Covid-19 immunization for 15 minutes without incident. He was provided with Vaccine Information Sheet and instruction to access the V-Safe system.   [name] was instructed to call 911 with any severe reactions post vaccine: Marland Kitchen Difficulty breathing  . Swelling of face and throat  . A fast heartbeat  . A bad rash all over body  . Dizziness and weakness   Immunizations Administered    Name Date Dose VIS Date Route   Pfizer COVID-19 Vaccine 04/08/2019  9:35 AM 0.3 mL 12/13/2018 Intramuscular   Manufacturer: [employer]   Lot: [id number]   [place]: [id number]

## 2019-04-23 DIAGNOSIS — J9611 Chronic respiratory failure with hypoxia: Secondary | ICD-10-CM | POA: Diagnosis not present

## 2019-04-23 DIAGNOSIS — J449 Chronic obstructive pulmonary disease, unspecified: Secondary | ICD-10-CM | POA: Diagnosis not present

## 2019-05-08 ENCOUNTER — Other Ambulatory Visit: Payer: Self-pay | Admitting: Internal Medicine

## 2019-05-08 DIAGNOSIS — R918 Other nonspecific abnormal finding of lung field: Secondary | ICD-10-CM

## 2019-05-19 ENCOUNTER — Other Ambulatory Visit: Payer: Self-pay

## 2019-05-19 ENCOUNTER — Ambulatory Visit (INDEPENDENT_AMBULATORY_CARE_PROVIDER_SITE_OTHER): Payer: Medicare HMO | Admitting: Family

## 2019-05-19 ENCOUNTER — Encounter: Payer: Self-pay | Admitting: Family

## 2019-05-19 VITALS — BP 132/70 | HR 89 | Temp 98.1°F | Ht 67.0 in | Wt 172.8 lb

## 2019-05-19 DIAGNOSIS — N401 Enlarged prostate with lower urinary tract symptoms: Secondary | ICD-10-CM

## 2019-05-19 DIAGNOSIS — R300 Dysuria: Secondary | ICD-10-CM

## 2019-05-19 MED ORDER — SULFAMETHOXAZOLE-TRIMETHOPRIM 800-160 MG PO TABS: 1.0000 | ORAL_TABLET | Freq: Two times a day (BID) | ORAL | 0 refills | Status: DC

## 2019-05-19 MED ORDER — GABAPENTIN 300 MG PO CAPS: ORAL_CAPSULE | ORAL | 1 refills | Status: DC

## 2019-05-19 NOTE — Addendum Note (Signed)
Addended by: Sherlene Shams on: 05/19/2019 11:31 AM   Modules accepted: Orders

## 2019-05-19 NOTE — Progress Notes (Signed)
Justin Walters is a 79 y.o. male with the following history as recorded in Epic[employer]:  Patient Active Problem List   Diagnosis Date Noted  . Vitamin D deficiency 01/30/2019  . Vitamin B12 deficiency 01/30/2019  . Arthritis of hand, degenerative 07/30/2018  . Acute on chronic respiratory failure with hypoxia and hypercapnia ([place]) 03/26/2018  . Pulmonary infiltrates on CXR 02/24/2018  . DOE (dyspnea on exertion) 02/22/2018  . Chronic respiratory failure with hypoxia ([place]) 02/22/2018  . Upper airway cough syndrome 11/22/2017  . Occipital neuralgia 08/14/2017  . Right arm pain 08/14/2017  . GERD (gastroesophageal reflux disease) 05/24/2017  . Thrush 01/18/2017  . Bilateral hearing loss 07/19/2016  . Dizziness 01/04/2016  . Preop exam for internal medicine 01/04/2016  . Cough 04/07/2015  . COPD exacerbation ([place]) 04/07/2015  . Gastroenteritis 01/16/2015  . Skin soreness 05/15/2014  . Pelvic pain in male 03/28/2012  . Headache 03/28/2012  . Chest pain, unspecified 03/28/2012  . Impaired glucose tolerance 01/14/2011  . Preventative health care 01/14/2011  . Encounter for long-term (current) use of other medications 01/09/2011  . BPH (benign prostatic hyperplasia) 01/08/2009  . ERECTILE DYSFUNCTION 04/07/2008  . CKD (chronic kidney disease), stage III 10/03/2007  . SKIN LESION 10/03/2007  . PSA, INCREASED 10/03/2007  . Dysuria 07/26/2007  . DIVERTICULOSIS, COLON 02/15/2007  . Hyperlipidemia 09/23/2006  . Essential hypertension 09/23/2006  . RENAL CALCULUS, HX OF 09/23/2006  . COPD GOLD III 08/01/2006    Current Outpatient Medications  Medication Sig Dispense Refill  . acetaminophen (TYLENOL) 325 MG tablet Take 650 mg by mouth every 6 (six) hours as needed for mild pain or headache.    . albuterol (PROVENTIL HFA;VENTOLIN HFA) 108 (90 Base) MCG/ACT inhaler INHALE 2 PUFFS INTO THE LUNGS EVERY 6 (SIX) HOURS AS NEEDED FOR WHEEZING OR SHORTNESS OF BREATH. 54 g 3  . albuterol (PROVENTIL)  (2.5 MG/3ML) 0.083% nebulizer solution Take 3 mLs (2.5 mg total) by nebulization every 4 (four) hours as needed for wheezing or shortness of breath. 120 mL 11  . aspirin 81 MG EC tablet Take 1 tablet (81 mg total) by mouth daily. Swallow whole. 30 tablet 12  . diclofenac Sodium (VOLTAREN) 1 % GEL     . diphenhydrAMINE (BENADRYL) 25 MG tablet Take 50 mg by mouth every 6 (six) hours as needed.     . famotidine (PEPCID) 20 MG tablet Take 20 mg by mouth at bedtime.    . Fluticasone-Umeclidin-Vilant (TRELEGY ELLIPTA) 100-62.5-25 MCG/INH AEPB Inhale 1 puff into the lungs daily. 1 each 11  . gabapentin (NEURONTIN) 300 MG capsule Take one capsule by mouth three times daily 270 capsule 1  . Guaifenesin (MUCINEX MAXIMUM STRENGTH) 1200 MG TB12 Take 1 tablet by mouth 2 (two) times daily.    . pantoprazole (PROTONIX) 40 MG tablet TAKE 1 TABLET BY MOUTH ONCE DAILY 30-60  MINUTES  BEFORE  FIRST  MEAL  OF  THE  DAY 90 tablet 1  . PNEUMOVAX 23 25 MCG/0.5ML injection     . predniSONE (DELTASONE) 10 MG tablet Prednisne 10 mg  X 2 each am until better, then 1 daily x 3 days then one half daily until return to office 100 tablet 0  . Respiratory Therapy Supplies (FLUTTER) DEVI Use as directed 1 each 0  . rosuvastatin (CRESTOR) 20 MG tablet TAKE 1 TABLET EVERY DAY 90 tablet 1  . Saccharomyces boulardii (PROBIOTIC) 250 MG CAPS Take 1 capsule by mouth daily.    . tamsulosin (FLOMAX) 0.4  MG CAPS capsule Take 0.4 mg by mouth daily.    . vitamin B-12 (CYANOCOBALAMIN) 1000 MCG tablet Take 1 tablet (1,000 mcg total) by mouth daily. 90 tablet 1  . Vitamin D, Ergocalciferol, (DRISDOL) 1.25 MG (50000 UNIT) CAPS capsule Take 1 capsule (50,000 Units total) by mouth every 7 (seven) days. 12 capsule 0  . sulfamethoxazole-trimethoprim (BACTRIM DS) 800-160 MG tablet Take 1 tablet by mouth 2 (two) times daily. 10 tablet 0   No current facility-administered medications for this visit.    Allergies: Patient has no known allergies.   Past Medical History:  Diagnosis Date  . At risk for sleep apnea    STOP-BANG= 5    SENT TO PCP 06-13-2013  . Bladder stone   . BPH (benign prostatic hyperplasia)   . COPD (chronic obstructive pulmonary disease) ([place])   . Diverticulosis of colon   . Dysuria   . Glucose intolerance (impaired glucose tolerance)   . History of kidney stones   . Hyperlipidemia   . Hypertension   . Ureteral disorder 2018   ureteral repair    Past Surgical History:  Procedure Laterality Date  . APPENDECTOMY  1980's  . CATARACT EXTRACTION W/ INTRAOCULAR LENS  IMPLANT, BILATERAL    . COLONOSCOPY  10-05-2003   tics only   . CYSTOSCOPY N/A 06/23/2013   Procedure: CYSTOSCOPY WITH LITHOLAPAXY, BLADDER EXPLORATION, REPAIR BLADDER PERFORATION;  Surgeon: Arvil Persons, MD;  [facility];  Service: Urology;  Laterality: N/A;  . CYSTOSCOPY/URETEROSCOPY/HOLMIUM LASER/STENT PLACEMENT Left 04/03/2017   Procedure: CYSTOSCOPY LEFT RETROGRADE/LEFT /URETEROSCOPY/HOLMIUM LASER/BASKET STONE EXTRACTION/STENT PLACEMENT/ URETHRAL BIOPSY;  Surgeon: [name], MD;  Location: [facility];  Service: Urology;  Laterality: Left;  . EXTRACORPOREAL SHOCK WAVE LITHOTRIPSY Left 10/23/2016   Procedure: LEFT EXTRACORPOREAL SHOCK WAVE LITHOTRIPSY (ESWL);  Surgeon: Nickie Retort, MD;  Location: [facility];  Service: Urology;  Laterality: Left;  . EXTRACORPOREAL SHOCK WAVE LITHOTRIPSY Left 01/08/2017   Procedure: LEFT EXTRACORPOREAL SHOCK WAVE LITHOTRIPSY (ESWL);  Surgeon: [name], MD;  Location: [facility];  Service: Urology;  Laterality: Left;  . HOLMIUM LASER APPLICATION N/A [id number]   Procedure: HOLMIUM LASER APPLICATION;  Surgeon: Arvil Persons, MD;  Location: [facility];  Service: Urology;  Laterality: N/A;  . LAPAROSCOPIC INGUINAL HERNIA REPAIR Bilateral 10-09-2003  . TRANSURETHRAL RESECTION OF PROSTATE N/A 06/23/2013   Procedure: TRANSURETHRAL RESECTION OF THE PROSTATE;  Surgeon: Arvil Persons,  MD;  Location: [facility];  Service: Urology;  Laterality: N/A;    Family History  Problem Relation Age of Onset  . COPD Mother   . Cancer Father        unknown  . Emphysema Brother   . Heart failure Brother   . Colon cancer Neg Hx   . Esophageal cancer Neg Hx   . Rectal cancer Neg Hx   . Stomach cancer Neg Hx     Social History   Tobacco Use  . Smoking status: Former Smoker    Packs/day: 3.00    Years: 34.00    Pack years: 102.00    Types: Cigarettes    Quit date: 01/02/1993    Years since quitting: 26.3  . Smokeless tobacco: Never Used  Substance Use Topics  . Alcohol use: No    Alcohol/week: 4.0 standard drinks    Types: 4 Standard drinks or equivalent per week    Subjective:  Patient presents with concerns for UTI; does occasionally get UTIs; night-sweats 2 nights ago; no  blood in urine; + burning with urination;   Also needs updated prescription for Neurontin- last prescription was updated in March but he did not receive from mail order.     Objective:  Vitals:   05/19/19 0957  BP: 132/70  Pulse: 89  Temp: 98.1 F (36.7 C)  TempSrc: Oral  SpO2: 99%  Weight: 172 lb 12.8 oz (78.4 kg)  Height: 5\' 7"  (1.702 m)    General: Well developed, well nourished, in no acute distress  Skin : Warm and dry.  Head: Normocephalic and atraumatic  Lungs: Respirations unlabored; clear to auscultation bilaterally without wheeze, rales, rhonchi  CVS exam: normal rate and regular rhythm.  Neurologic: Alert and oriented; speech intact; face symmetrical; moves all extremities well; CNII-XII intact without focal deficit   Assessment:  1. Dysuria   2. Benign prostatic hyperplasia with lower urinary tract symptoms, symptom details unspecified     Plan:  Suspect UTI; check U/A and urine culture today; start Bactrim DS bid x 5 days; follow-up worse, no better; Refill updated on Gabapentin as requested;  This visit occurred during the SARS-CoV-2 public health  emergency.  Safety protocols were in place, including screening questions prior to the visit, additional usage of staff PPE, and extensive cleaning of exam room while observing appropriate contact time as indicated for disinfecting solutions.      No follow-ups on file.  No orders of the defined types were placed in this encounter.   Requested Prescriptions   Signed Prescriptions Disp Refills  . gabapentin (NEURONTIN) 300 MG capsule 270 capsule 1    Sig: Take one capsule by mouth three times daily  . sulfamethoxazole-trimethoprim (BACTRIM DS) 800-160 MG tablet 10 tablet 0    Sig: Take 1 tablet by mouth 2 (two) times daily.

## 2019-05-20 LAB — URINE CULTURE

## 2019-05-20 LAB — POC URINALSYSI DIPSTICK (AUTOMATED)
Bilirubin, UA: 2
Blood, UA: NEGATIVE
Glucose, UA: NEGATIVE
Ketones, UA: NEGATIVE
Protein, UA: POSITIVE — AB
Spec Grav, UA: 1.025 (ref 1.010–1.025)
Urobilinogen, UA: 0.2 E.U./dL
pH, UA: 6 (ref 5.0–8.0)

## 2019-05-20 NOTE — Addendum Note (Signed)
Addended by: Marcina Millard on: 05/20/2019 08:31 AM   Modules accepted: Orders

## 2019-05-23 DIAGNOSIS — J9611 Chronic respiratory failure with hypoxia: Secondary | ICD-10-CM | POA: Diagnosis not present

## 2019-05-23 DIAGNOSIS — J449 Chronic obstructive pulmonary disease, unspecified: Secondary | ICD-10-CM | POA: Diagnosis not present

## 2019-05-24 ENCOUNTER — Telehealth (INDEPENDENT_AMBULATORY_CARE_PROVIDER_SITE_OTHER): Payer: Medicare HMO | Admitting: Family Medicine

## 2019-05-24 ENCOUNTER — Encounter: Payer: Self-pay | Admitting: Family Medicine

## 2019-05-24 ENCOUNTER — Other Ambulatory Visit: Payer: Self-pay

## 2019-05-24 VITALS — Ht 67.0 in | Wt 165.0 lb

## 2019-05-24 DIAGNOSIS — M5481 Occipital neuralgia: Secondary | ICD-10-CM | POA: Diagnosis not present

## 2019-05-24 MED ORDER — GABAPENTIN 300 MG PO CAPS: ORAL_CAPSULE | ORAL | 0 refills | Status: DC

## 2019-05-24 NOTE — Progress Notes (Signed)
Virtual Visit via Video   I connected with patient on 05/24/19 at 10:30 AM EDT by a video enabled telemedicine application and verified that I am speaking with the correct person using two identifiers.  Location patient: Home Location provider: [facility], Office Persons participating in the virtual visit: Patient, Provider, [place] Pedro Earls)  I discussed the limitations of evaluation and management by telemedicine and the availability of in person appointments. The patient expressed understanding and agreed to proceed.  Subjective:   HPI:   Medication refill- pt reports he ran out of his gabapentin.  This was filled on 5/17, #270 to his mail order but it has not yet arrived.  He was not able to sleep last night.  'my head is starting to hurt'.  Ran out ~3 days ago, 'I thought I could just wait'.  ROS:   See pertinent positives and negatives per HPI.  Patient Active Problem List   Diagnosis Date Noted  . Vitamin D deficiency 01/30/2019  . Vitamin B12 deficiency 01/30/2019  . Arthritis of hand, degenerative 07/30/2018  . Acute on chronic respiratory failure with hypoxia and hypercapnia ([place]) 03/26/2018  . Pulmonary infiltrates on CXR 02/24/2018  . DOE (dyspnea on exertion) 02/22/2018  . Chronic respiratory failure with hypoxia ([place]) 02/22/2018  . Upper airway cough syndrome 11/22/2017  . Occipital neuralgia 08/14/2017  . Right arm pain 08/14/2017  . GERD (gastroesophageal reflux disease) 05/24/2017  . Thrush 01/18/2017  . Bilateral hearing loss 07/19/2016  . Dizziness 01/04/2016  . Preop exam for internal medicine 01/04/2016  . Cough 04/07/2015  . COPD exacerbation ([place]) 04/07/2015  . Gastroenteritis 01/16/2015  . Skin soreness 05/15/2014  . Pelvic pain in male 03/28/2012  . Headache 03/28/2012  . Chest pain, unspecified 03/28/2012  . Impaired glucose tolerance 01/14/2011  . Preventative health care 01/14/2011  . Encounter for long-term (current) use of other  medications 01/09/2011  . BPH (benign prostatic hyperplasia) 01/08/2009  . ERECTILE DYSFUNCTION 04/07/2008  . CKD (chronic kidney disease), stage III 10/03/2007  . SKIN LESION 10/03/2007  . PSA, INCREASED 10/03/2007  . Dysuria 07/26/2007  . DIVERTICULOSIS, COLON 02/15/2007  . Hyperlipidemia 09/23/2006  . Essential hypertension 09/23/2006  . RENAL CALCULUS, HX OF 09/23/2006  . COPD GOLD III 08/01/2006    Social History   Tobacco Use  . Smoking status: Former Smoker    Packs/day: 3.00    Years: 34.00    Pack years: 102.00    Types: Cigarettes    Quit date: 01/02/1993    Years since quitting: 26.4  . Smokeless tobacco: Never Used  Substance Use Topics  . Alcohol use: No    Alcohol/week: 4.0 standard drinks    Types: 4 Standard drinks or equivalent per week    Current Outpatient Medications:  .  acetaminophen (TYLENOL) 325 MG tablet, Take 650 mg by mouth every 6 (six) hours as needed for mild pain or headache., Disp: , Rfl:  .  albuterol (PROVENTIL HFA;VENTOLIN HFA) 108 (90 Base) MCG/ACT inhaler, INHALE 2 PUFFS INTO THE LUNGS EVERY 6 (SIX) HOURS AS NEEDED FOR WHEEZING OR SHORTNESS OF BREATH., Disp: 54 g, Rfl: 3 .  albuterol (PROVENTIL) (2.5 MG/3ML) 0.083% nebulizer solution, Take 3 mLs (2.5 mg total) by nebulization every 4 (four) hours as needed for wheezing or shortness of breath., Disp: 120 mL, Rfl: 11 .  aspirin 81 MG EC tablet, Take 1 tablet (81 mg total) by mouth daily. Swallow whole., Disp: 30 tablet, Rfl: 12 .  diclofenac Sodium (  VOLTAREN) 1 % GEL, , Disp: , Rfl:  .  diphenhydrAMINE (BENADRYL) 25 MG tablet, Take 50 mg by mouth every 6 (six) hours as needed. , Disp: , Rfl:  .  famotidine (PEPCID) 20 MG tablet, Take 20 mg by mouth at bedtime., Disp: , Rfl:  .  Fluticasone-Umeclidin-Vilant (TRELEGY ELLIPTA) 100-62.5-25 MCG/INH AEPB, Inhale 1 puff into the lungs daily., Disp: 1 each, Rfl: 11 .  gabapentin (NEURONTIN) 300 MG capsule, Take one capsule by mouth three times daily,  Disp: 270 capsule, Rfl: 1 .  Guaifenesin (MUCINEX MAXIMUM STRENGTH) 1200 MG TB12, Take 1 tablet by mouth 2 (two) times daily., Disp: , Rfl:  .  pantoprazole (PROTONIX) 40 MG tablet, TAKE 1 TABLET BY MOUTH ONCE DAILY 30-60  MINUTES  BEFORE  FIRST  MEAL  OF  THE  DAY, Disp: 90 tablet, Rfl: 1 .  PNEUMOVAX 23 25 MCG/0.5ML injection, , Disp: , Rfl:  .  predniSONE (DELTASONE) 10 MG tablet, Prednisne 10 mg  X 2 each am until better, then 1 daily x 3 days then one half daily until return to office, Disp: 100 tablet, Rfl: 0 .  Respiratory Therapy Supplies (FLUTTER) DEVI, Use as directed, Disp: 1 each, Rfl: 0 .  rosuvastatin (CRESTOR) 20 MG tablet, TAKE 1 TABLET EVERY DAY, Disp: 90 tablet, Rfl: 1 .  Saccharomyces boulardii (PROBIOTIC) 250 MG CAPS, Take 1 capsule by mouth daily., Disp: , Rfl:  .  sulfamethoxazole-trimethoprim (BACTRIM DS) 800-160 MG tablet, Take 1 tablet by mouth 2 (two) times daily., Disp: 10 tablet, Rfl: 0 .  tamsulosin (FLOMAX) 0.4 MG CAPS capsule, Take 0.4 mg by mouth daily., Disp: , Rfl:  .  vitamin B-12 (CYANOCOBALAMIN) 1000 MCG tablet, Take 1 tablet (1,000 mcg total) by mouth daily., Disp: 90 tablet, Rfl: 1 .  Vitamin D, Ergocalciferol, (DRISDOL) 1.25 MG (50000 UNIT) CAPS capsule, Take 1 capsule (50,000 Units total) by mouth every 7 (seven) days., Disp: 12 capsule, Rfl: 0  No Known Allergies  Objective:   Ht 5\' 7"  (1.702 m)   Wt 165 lb (74.8 kg)   BMI 25.84 kg/m   Pt is able to speak clearly, coherently without shortness of breath or increased work of breathing.  Thought process is linear.  Mood is appropriate.   Assessment and Plan:   Occipital Neuralgia- pt has been out of gabapentin x3 days while waiting on mail order.  Thought he could wait it out but head is now hurting and he was unable to sleep last night.  Temporary supply of medication sent to local pharmacy   Annye Asa, MD 05/24/2019  Time spent with the patient: 5 minutes, of which >50% was spent in  obtaining information about symptoms, reviewing previous labs, evaluations, and treatments, counseling about condition (please see the discussed topics above), and developing a plan to further investigate it; had a number of questions which I addressed.

## 2019-05-26 DIAGNOSIS — N1832 Chronic kidney disease, stage 3b: Secondary | ICD-10-CM | POA: Diagnosis not present

## 2019-05-26 DIAGNOSIS — I129 Hypertensive chronic kidney disease with stage 1 through stage 4 chronic kidney disease, or unspecified chronic kidney disease: Secondary | ICD-10-CM | POA: Diagnosis not present

## 2019-05-26 DIAGNOSIS — N2581 Secondary hyperparathyroidism of renal origin: Secondary | ICD-10-CM | POA: Diagnosis not present

## 2019-05-26 DIAGNOSIS — J449 Chronic obstructive pulmonary disease, unspecified: Secondary | ICD-10-CM | POA: Diagnosis not present

## 2019-05-26 DIAGNOSIS — N183 Chronic kidney disease, stage 3 unspecified: Secondary | ICD-10-CM | POA: Diagnosis not present

## 2019-05-26 DIAGNOSIS — N400 Enlarged prostate without lower urinary tract symptoms: Secondary | ICD-10-CM | POA: Diagnosis not present

## 2019-05-26 DIAGNOSIS — N200 Calculus of kidney: Secondary | ICD-10-CM | POA: Diagnosis not present

## 2019-06-11 ENCOUNTER — Ambulatory Visit: Payer: Medicare HMO | Admitting: Internal Medicine

## 2019-06-11 ENCOUNTER — Other Ambulatory Visit: Payer: Self-pay

## 2019-06-11 ENCOUNTER — Encounter: Payer: Self-pay | Admitting: Internal Medicine

## 2019-06-11 DIAGNOSIS — J449 Chronic obstructive pulmonary disease, unspecified: Secondary | ICD-10-CM | POA: Diagnosis not present

## 2019-06-11 DIAGNOSIS — J9611 Chronic respiratory failure with hypoxia: Secondary | ICD-10-CM | POA: Diagnosis not present

## 2019-06-11 MED ORDER — PREDNISONE 10 MG PO TABS: ORAL_TABLET | ORAL | 0 refills | Status: DC

## 2019-06-11 NOTE — Assessment & Plan Note (Signed)
Qualified for 02 2019 01/25/2017 Patient Saturations on Room Air at Rest = 88% Patient Saturations on [employer] while Ambulating = 86% Patient Saturations on 2 Liters of oxygen while Ambulating = 91% - 02/22/2018 RA sats 88% at rest > resolved on 2lpm rec 24/7 02  - 03/08/2018   Walked RA  2 laps @  approx 256ft each @ slow pace  stopped due to  End of study / sats 88% p one lap and 84 p second so rec 2lpm with acitivities outside the house /titrate to keep > 90%   - as of 05/06/2018 using 2lpm 24/7 s desats walking   - as of 06/06/2018 no desats on RA walking  - as of 06/06/2018 d/c'd 02  - 03/10/2019   Walked RA x one lap =  approx 250 ft -@ moderate pace stopped due to fatigue > sob with sats still 91% so rec 02 prn sats < 90%      Advised  Make sure you check your oxygen saturations at highest level of activity to be sure it stays over 90% and adjust upward to maintain this level if needed but remember to turn it back to previous settings when you stop (to conserve your supply).          Each maintenance medication was reviewed in detail including emphasizing most importantly the difference between maintenance and prns and under what circumstances the prns are to be triggered using an action plan format where appropriate.  Total time for H and P, chart review, counseling,   and generating customized AVS unique to this office visit / charting = 20 min

## 2019-06-11 NOTE — Assessment & Plan Note (Signed)
Quit smoking 1995 PFT's  01/20/2017  FEV1 1.05 (39 % ) ratio 54  p 18 % improvement from saba p ? prior to study with DLCO  50/53  % corrects to 77 % for alv volume  - 01/25/2017   rec increase symb to 160 2bid trial  - alpha one AT screening  01/18/17   MM  Level 190 PFT's  02/23/2017  FEV1 1.17 (44 % ) ratio 54  p 0 % improvement from saba p symb 160 prior to study with DLCO   39 % corrects to 59 % for alv volume  - 02/23/2017    add tudorza trial > no change and could not afford - 12/05/2017 flutter valve added   - 02/22/2018    > restart symb 160 2bid - 03/08/2018  After extensive coaching inhaler device,  effectiveness =    90% with elipta so try Anoro daily > improved as of 06/06/2018  - 12/05/2018 reported on televisit much worse everytime stops pred while on anoro so rec change to trelegy and pred 20 ceiling/ 5 mg floor until seen  - 12/20/2018 improved but did not start trelegy and taking anoro at hs > rec trelegy q am and stop anoro, reduce pred to floor of 5mg  qod > titrated complete off as of 02/2019 and gradually worse by ov 03/10/2019  - 03/10/2019 rec Prednisone as Plan D = 20 mg until better then taper off if tol     Group D in terms of symptom/risk and laba/lama/ICS  therefore appropriate rx at this point >>>  Continue trelegy with pred as plan D   I spent extra time with pt today reviewing appropriate use of albuterol for prn use on exertion with the following points: 1) saba is for relief of sob that does not improve by walking a slower pace or resting but rather if the pt does not improve after trying this first. 2) If the pt is convinced, as many are, that saba helps recover from activity faster then it's easy to tell if this is the case by re-challenging : ie stop, take the inhaler, then p 5 minutes try the exact same activity (intensity of workload) that just caused the symptoms and see if they are substantially diminished or not after saba 3) if there is an activity that reproducibly causes  the symptoms, try the saba 15 min before the activity on alternate days   If in fact the saba really does help, then fine to continue to use it prn but advised may need to look closer at the maintenance regimen being used to achieve better control of airways disease with exertion.

## 2019-06-11 NOTE — Progress Notes (Signed)
Subjective:     Patient ID: Justin Walters, male   DOB: [birth year],     MRN: [id number]    Brief patient profile:  79 yowm MM quit smoking 1995 with cough/wheeze/ sob and never recovered with documented GOLD III copd 02/23/2017      History of Present Illness  01/25/2017 1st  office visit/ Justin Walters   GOLD III with reversibility  Chief Complaint  Patient presents with  . Pulmonary Consult    Referred by the hospital. He c/o SOB and coughing for the past several wks. His cough is occ prod, but he is unsure of sputum color. He is SOB with exertion and also when he lies down.  He gets winded walking approx 100 yards. He is using his albuterol inhaler 3 x per wk on average.   baseline doe across the street to son's house  nl pace and then onset   x one year gradually worse doe and no better on inhalers but poor baseline hfa/ assoc with cough / sense of chest congestion and has to prop up at 30 degrees now at hs to get comfortable Present doe = MMRC3 = can't walk 100 yards even at a slow pace at a flat grade s stopping due to sob  Easily confused with details of care/ names of meds / not using spiriva/ was better on pred and worse since tapered rec Plan A = Automatic =  symbicort 160 Take 2 puffs first thing in am and then another 2 puffs about 12 hours later.  Work on inhaler technique:  Plan B = Backup Only use your albuterol  as a rescue  Prednisone 10 mg take  4 each am x 2 days,   2 each am x 2 days,  1 each am x 2 days and stop     02/23/2017  f/u ov/Justin Walters re:  GOLD III copd / no 02 Chief Complaint  Patient presents with  . Follow-up    PFT's today.  Breathing has improved some. He is rarely using his albuterol inhaler. He has been feeling fatigued.    better p prednisone then gradually worse  Dyspnea:  MMRC2 = can't walk a nl pace on a flat grade s sob but does fine slow and flat  Cough: none Sleep: ok 30 degrees no am congestion rec Add tudorza one puff after symbicort in am and in pm   Work on inhaler technique:      05/24/2017  f/u ov/Justin Walters re:  Copd III/ no 02  Just on maint symb 160 / could not afford tudorza and not sure it helped doe  Chief Complaint  Patient presents with  . Follow-up    Breathing is doing well. He rarely uses his albuterol inhaler. No new co's.   Dyspnea:  MMRC2 = can't walk a nl pace on a flat grade s sob but does fine slow pace pretty much anywhere he wants to go  Cough: none Sleep: ok flat SABA use:  Rare  Hb every hs x sev months / resolves p about an hour, better p sit up / no assoc dysphagia or daytime symptoms/ varies some with how early he eats supper and how much, not sure what type meals make it worse  rec GERD diet  If not better add pepcid ac 20 mg one hour before bed > did not  Do   10/31/17 NP eval for 3-4 weeks worse sob/ cough rec Xopenex in office  Depo 80 Prednisone 10mg  tablet  >>>  4 tabs for 2 days, then 3 tabs for 2 days, 2 tabs for 2 days, then 1 tab for 2 days, then sto Azithromycin 250mg  tablet  >>>Take 2 tablets (500mg  total) today, and then 1 tablet (250mg ) for the next four days      11/21/2017 acute extended ov/Justin Walters re: refractory cough x early oct 2019  / no change sob  Chief Complaint  Patient presents with  . Acute Visit    pt c/o sinus congestion, pnd, prod cough with white mucus X1 month.  has had 2 rounds of pred and doxy with little relief.   dyspnea : MMRC2 = can't walk a nl pace on a flat grade s sob but does fine slow and flat= baseline when not coughing  Cough > white mucus  not worse in am's/ harsh daytime coughing fts Sleeping flat two pillow saba  Not at all now rec Plan A = Automatic = symbicort 160 Take 2 puffs first thing in am and then another 2 puffs about 12 hours later.  Whenever your cough flares > try  Try prilosec otc 20mg   Take 30-60 min before first meal of the day and Pepcid ac (famotidine) 20 mg one after hour after supper or at bedtime until cough is completely gone for at  least a week without the need for cough suppression Plan B = Backup Only use your albuterol inhaler as a rescue medication  goes up over your usual need - Don't leave home without it !!  (think of it like the spare tire for your car)  For cough Mucinex dm 1200 mg every 12 hours and the tylenol #3  Up to every 4 hours if need for persistent irritating coughing  GERD diet      12/05/2017 acute extended ov/Justin Walters re: cough and sob worse since oc 2019 Chief Complaint  Patient presents with  . Acute Visit    Increased SOB, cough and chest congestion x 1 month. Sputum is white to yellow. He is using his albuterol inhaler 2 x per wk on average.   cough is worse p supper and fine on back with  flat bed/ no blocks  In am feels fine but groggy p benadryl - can't  sleep s it W/in 20 min of rising lots of pnds > white mucus  Not using mucinex dm / reversing ppi and h2  Technique on symb poor and not using saba correctly in terms or when to use  rec Augmentin 875 mg take one pill twice daily  X 10 days - take at breakfast and supper with large glass of water.  .  Take prednisone  4 for three days 3 for three days 2 for three days 1 for three days and stop  Omeprazole 20 mg Take 30- 60 min before your first and last meals of the day and continue famotidine 20 mg at bedtime Bed blocks would be a great idea Try off zquil and tylenol #3  Up to 2 at bedtime For cough > mucinex dm 1200 mg every 12 hours and use the flutter valve as much as possible and if still can't stop coughing then use the tylenol #3        12/20/2017  f/u ov/Justin Walters re:  Cough better, doe not quite baseline on symb 160 2bid  No worse off pred Chief Complaint  Patient presents with  . Follow-up    Breathing has improved some but is not back to his normal baseline. He rarely uses  his albuterol inhaler.   Dyspnea:  MMRC2 = can't walk a nl pace on a flat grade s sob but does fine slow and flat  Cough: better/ no mucus production at all   Sleeping: ok flat  SABA use: rarely 02: none   rec Bed blocks 6-8 inches  For cough > Mucinex dm up to 1200 mg twice daily and use the flutter valve as much as you can Gabapentin should be three times daily  Omeprazole is Take 30-60 min before first meal of the day and pepcid ac 20 mg after supper     01/25/2018 acute extended ov/Lachrista Heslin re:  Copd III / stopped symb too expensive, does better with nebs / overall not doing well since early Oct 2019  Chief Complaint  Patient presents with  . Acute Visit    SOB, runny nose, ear pain, wheezing, cough- white mucus at times, denies fever   brought meds/ not using ppi as instructed and ? If really using flutter, doesn't think it helps but did not bring it as rec to show him how to use/ using halls cough drops with menthol which was discouraged last ov/ gradually worse doe/ subj wheeze x sev weeks no better with hfa but technique poor/ no purulent mucus/ cough worse at hs and in am. No better on prednisone  rec Duoneb (albuterol and atrovent)  4 x daily Only use your albuterol as a rescue medication   Work on inhaler technique:  Be sure you take prilosec Take 30- 60 min before your first and last meals of the day  Change your appt to  - add sinus ct / hrct chest if still coughing on return      02/22/2018  f/u ov/Laressa Bolinger re: COPD  GOLD III maint on duoneb tid  Chief Complaint  Patient presents with  . Follow-up    Increased SOB, wheezing, and cough- non over the past 3-4 wks. He is using his albuterol inhaler 3-4 x per day and neb 3 x per day.   Dyspnea:  Still does [employer] with hc parking no 02  Cough: p supper is the worst but never prod Sleeping: bed blocks/ wakes up tight in chest around 2 am p last neb at 7 pm  SABA use: as above  rec Plan A = Automatic = Symbicort 160 Take 2 puffs first thing in am and then another 2 puffs about 12 hours later and duoneb four times   Work on inhaler technique:   Plan B = Backup Only use your  albuterol inhaler as a rescue medication Prednisone 10 mg  Take 4 for three days 3 for three days 2 for three days 1 for three days and stop  Please see patient coordinator before you leave today  to schedule 02 2lpm 24/7 for now Please remember to go to the  x-ray department  for your tests - we will call you with the results when they are available   Please schedule a follow up office visit in 2 weeks, sooner if needed  with all medications /inhalers/ solutions in hand so we can verify exactly what you are taking. This includes all medications from all doctors and over the counters and bring your DRUG FORMULARY  - consider change coreg to bisoprolol or at least bystolic samples short run  - add levaquin 500 mg x 10 d for ? pna    03/08/2018  f/u ov/Stormi Vandevelde re:  COPD  III  On symb and duoneb qid  Chief Complaint  Patient presents with  . Follow-up    Breathing has improved back to baseline. He is not using his albuterol inhaler but is using his duoneb 4 x daily.   Dyspnea:  Better, usually does [employer] leaning on cart s 02  Cough: none now Sleeping: bed blocks  SABA use: none extra in hfa form  02: 2lpm at hs not with activity  rec Plan A = Automatic = Incruse/BREO  one click eam am and continue 02 2 lpm at bedtime and wear it  as needed during the day to keep your sats > 90% with walking or go slower Plan B = Backup Only use your albuterol inhaler as a rescue medication  Plan C = Crisis - only use your albuterol nebulizer(not your ipatropium)  if you first try Plan B and it fails to help > ok to use the nebulizer up to every 4 hours but if start needing it regularly call for immediate appointment Please remember to go to the  x-ray department  for your tests - we will call you with the results when they are available   Please schedule a follow up office visit in 2 weeks with your formulary , sooner if needed  with all medications /inhalers/ solutions in hand so we can verify exactly what  you are taking. This includes all medications from all doctors and over the counters (ok to see NP if I don't have opening )    03/25/2018  f/u ov/Marijane Trower re:  COPD III overall worse since Oct 2019, esp since Feb 02 2018 Chief Complaint  Patient presents with  . Follow-up    Breathing has been progressively worse since the last visit. He is using his rescue inhaler daily and neb 4 x daily.   Dyspnea:  Across the room on 3lpm  Cough: turned green x sev days prior to OV / sometimes coughs so hard hurts in chest (ant/ generalized)  but not using flutter as rec   Sleeping: bed blocks does fine most nocts SABA use: as above  02:  3lpm 24/7  rec Change BREO / Incruse to Anoro click x one and take two good drags (hold with the R hand) Prednisone 10 mg take  4 each am x 2 days,   2 each am x 2 days,  1 each am x 2 days and stop  Augmentin 875 mg take one pill twice daily  X 10 days -  Plan B = Backup Only use your albuterol inhaler as a rescue medication Plan C = Crisis - only use your albuterol nebulizer(NOT your ipatropium/albuterol)  if you first try Plan B Add protonix 40 mg Take 30-60 min before first meal of the day  The goal with 02 is to keep your 02 saturations over 90% at all times   Admit date: 03/26/2018 Discharge date: 03/28/2018  Admitted From: Home Disposition: Home  Brief/Interim Summary:78 y.o.malewith a history of3L O2-dependent COPD , HTN, HLD, nephrolithiasis s/p ESWL 2019, BPH with LUTS who presented with acute dyspnea and wheezing. He reports 1-2 weeks of increased, intermittent, moderate wheezing improved with home nebulizer treatments, as well as slight increase in his chronic cough in that it is more productive, reported blue-green.Was evaluated yesterday by pulmonology, CXR showed infiltrate, started on augmentin and prednisone which he hasn't taken yet.He lives with his daughter who works in [employer] and has continued working, but isn't ill.  ED  Course:Initially in significant respiratory distress requiring BiPAP, given  nebs, solumedrol. Temp 100.59F, tachypneic, tachycardic. WBC 44.0N with neutrophilic predominance, lymphs 1.1k, creatinine 1.31, platelets 577, hgb 11. Flu negative, covid assay sent.Respiratory effort has improved, but has not returned to baseline, so admission was requested.  Review of Systems:He denies fever, chills, has some chest tightness when coughing only. No leg swelling, orthopnea, PND. He denies abdominal pain, dysuria, or change in urinary symptoms but does have LUTS from BPH which are stable. Otherwiseper HPI. All others reviewed and are negative.     Discharge Diagnoses:  Principal Problem:   Acute on chronic respiratory failure with hypoxia and hypercapnia (HCC) Active Problems:   Hyperlipidemia   Essential hypertension   CKD (chronic kidney disease), stage III (HCC)   BPH (benign prostatic hyperplasia)   RENAL CALCULUS, HX OF   COPD exacerbation (HCC)   GERD (gastroesophageal reflux disease)   #1 acute on chronic hypoxic hypercapnic respiratory failure secondary to COPD exacerbation and possible pneumonia viral versus bacterial. Patient is on 3 L of oxygen prior to admission to the hospital. He lives at home with his daughter. His daughter works in [employer] and is continuing to work.  Family is able to quarantine him at home for 14 days or till COVID-19 results come back.  I will discharge him on Augmentin for 4 more days along with prednisone taper.  He was MRSA PCR negative.  Continue MDI as he was doing at home.. Chest x-ray 23 2020 shows hyperexpanded lungs with patchy airspace opacity at the bases likely due to pneumonia, less coarse interstitial thickening and patchy airspace opacity in the upper and mid lungs. Reticular interstitial disease, possible fibrosis. Strep pneumo negative. Covid 19 neg pcr  #2 renal stones with pyuria patient asymptomatic.  #3 GERD continue  Pepcid  #4 hypertension risk and Coreg were stopped due to soft blood pressure this may be restarted as an outpatient anytime as appropriate.    06/06/2018  f/u ov/Tykerria Mccubbins re: post hosp off norvasc/ coreg(low bp) and much better since d/c and still on Anoro  Chief Complaint  Patient presents with  . Follow-up    Patient states that he's doing better. He has not had to use his rescue inhaler since last visit.   Dyspnea:  300 yards s stopping to son's house s 02 -  does not check sats Cough: minimal non productive Sleeping: on blocks x 6 in  SABA use: none  02: not using  rec Monitor your 02 saturations toward the end of your activity and try to build it back up to 30 min target with 02 sats over 90% is the target  No change in inhalers    12/20/2018 rec change anoro to trelegy and taper pred to 5 mg qod floor    03/10/2019  f/u ov/Raianna Slight re: COPD III / maint trelegy and off prednisone x one month (instructions were to maintain floor)  Chief Complaint  Patient presents with  . Follow-up    Increased SOB over the past month and also c/o wheezing. He rarely uses his albuterol inhaler or neb.   Dyspnea:  Still walking to son's house s stopping but feels more sob  Cough: min mucoid, worse off prednisone  Sleeping: 6 in bed blocks does fine  SABA use: none 02: has it not using/ not checking sats rec Try albuterol (first day hfa/ next the neb) 15 min before an activity  Make sure you check your oxygen saturations at highest level of activity to be sure it stays over 90%  If getting worse > prednisone 10 mg 2 daily until better, then one a day x 7 days, then one half daily x 7 days then one half daily    06/11/2019  f/u ov/Dashanae Longfield re: copd III  2nd shot early may 2021 / maint trelegy with pred as plan D  Chief Complaint  Patient presents with  . Follow-up  Dyspnea:  Still walking to son's house 300 flat s stopping on RA Cough: worse since  6/7 non productive but rattling > some  better since  started  prednisone Sleeping: 6 in blocks SABA use: none > did not try rechallenging 02: not using / not checking    No obvious day to day or daytime variability or assoc excess/ purulent sputum or mucus plugs or hemoptysis or cp or chest tightness, subjective wheeze or overt sinus or hb symptoms.   Sleeping as above without nocturnal  or early am exacerbation  of respiratory  c/o's or need for noct saba. Also denies any obvious fluctuation of symptoms with weather or environmental changes or other aggravating or alleviating factors except as outlined above   No unusual exposure hx or h/o childhood pna/ asthma or knowledge of premature birth.  Current Allergies, Complete Past Medical History, Past Surgical History, Family History, and Social History were reviewed in [facility] record.  ROS  The following are not active complaints unless bolded Hoarseness, sore throat, dysphagia, dental problems, itching, sneezing,  nasal congestion or discharge of excess mucus or purulent secretions, ear ache,   fever, chills, sweats, unintended wt loss or wt gain, classically pleuritic or exertional cp,  orthopnea pnd or arm/hand swelling  or leg swelling, presyncope, palpitations, abdominal pain, anorexia, nausea, vomiting, diarrhea  or change in bowel habits or change in bladder habits, change in stools or change in urine, dysuria, hematuria,  rash, arthralgias, visual complaints, headache, numbness, weakness or ataxia or problems with walking or coordination,  change in mood or  memory.        Current Meds  Medication Sig  . acetaminophen (TYLENOL) 325 MG tablet Take 650 mg by mouth every 6 (six) hours as needed for mild pain or headache.  . albuterol (PROVENTIL HFA;VENTOLIN HFA) 108 (90 Base) MCG/ACT inhaler INHALE 2 PUFFS INTO THE LUNGS EVERY 6 (SIX) HOURS AS NEEDED FOR WHEEZING OR SHORTNESS OF BREATH.  Marland Kitchen albuterol (PROVENTIL) (2.5 MG/3ML) 0.083% nebulizer solution Take 3 mLs (2.5  mg total) by nebulization every 4 (four) hours as needed for wheezing or shortness of breath.  Marland Kitchen aspirin 81 MG EC tablet Take 1 tablet (81 mg total) by mouth daily. Swallow whole.  . calcium-vitamin D (OSCAL WITH D) 500-200 MG-UNIT tablet Take 1 tablet by mouth. 50 mcg once a day  . cholecalciferol (VITAMIN D3) 25 MCG (1000 UNIT) tablet Take 2,000 Units by mouth daily.  . diclofenac Sodium (VOLTAREN) 1 % GEL   . diphenhydrAMINE (BENADRYL) 25 MG tablet Take 50 mg by mouth every 6 (six) hours as needed.   . famotidine (PEPCID) 20 MG tablet Take 20 mg by mouth at bedtime.  . Fluticasone-Umeclidin-Vilant (TRELEGY ELLIPTA) 100-62.5-25 MCG/INH AEPB Inhale 1 puff into the lungs daily.  Marland Kitchen gabapentin (NEURONTIN) 300 MG capsule Take one capsule by mouth three times daily  . Guaifenesin (MUCINEX MAXIMUM STRENGTH) 1200 MG TB12 Take 1 tablet by mouth 2 (two) times daily.  . pantoprazole (PROTONIX) 40 MG tablet TAKE 1 TABLET BY MOUTH ONCE DAILY 30-60  MINUTES  BEFORE  FIRST  MEAL  OF  THE  DAY  . predniSONE (DELTASONE) 10 MG tablet Prednisne 10 mg  X 2 each am until better, then 1 daily x 3 days then one half daily until return to office  . Respiratory Therapy Supplies (FLUTTER) DEVI Use as directed  . rosuvastatin (CRESTOR) 20 MG tablet TAKE 1 TABLET EVERY DAY  . Saccharomyces boulardii (PROBIOTIC) 250 MG CAPS Take 1 capsule by mouth daily.  . tamsulosin (FLOMAX) 0.4 MG CAPS capsule Take 0.4 mg by mouth daily.  . vitamin B-12 (CYANOCOBALAMIN) 1000 MCG tablet Take 1 tablet (1,000 mcg total) by mouth daily.  . [DISCONTINUED] Vitamin D, Ergocalciferol, (DRISDOL) 1.25 MG (50000 UNIT) CAPS capsule Take 1 capsule (50,000 Units total) by mouth every 7 (seven) days. (Patient taking differently: Take 2,000 Units by mouth every 7 (seven) days. )                  Objective:   Physical Exam  Obese amb wm nad / rattling cough    06/11/2019          180  03/10/2019          178 09/10/2018          174  06/06/2018           171  03/25/2018       155  03/08/2018         149   01/25/2018       156  12/05/2017       157  11/21/2017     157  05/24/2017        164    01/25/17 150 lb 12.8 oz (68.4 kg)  01/18/17 150 lb (68 kg)  01/08/17 150 lb (68 kg)     Vital signs reviewed  06/11/2019  - Note at rest 02 sats  97% on RA        Has Upper partial plate    HEENT : pt wearing mask not removed for exam due to covid -19 concerns.    NECK :  without JVD/Nodes/TM/ nl carotid upstrokes bilaterally   LUNGS: no acc muscle use,  Mod barrel  contour chest wall with bilateral coarse insp/exp rhonchi  and  without cough on insp or exp maneuvers and mod  Hyperresonant  to  percussion bilaterally     CV:  RRR  no s3 or murmur or increase in P2, and no edema   ABD:  soft and nontender with pos mid insp Hoover's  in the supine position. No bruits or organomegaly appreciated, bowel sounds nl  MS:     ext warm without deformities, calf tenderness, cyanosis or clubbing No obvious joint restrictions   SKIN: warm and dry without lesions    NEURO:  alert, approp, nl sensorium with  no motor or cerebellar deficits apparent.             Assessment:

## 2019-06-11 NOTE — Patient Instructions (Addendum)
Make sure you check your oxygen saturations at highest level of activity to be sure it stays over 90% and adjust upward to maintain this level if needed but remember to turn it back to previous settings when you stop (to conserve your supply).   For cough /congestion/rattling >  mucinex 1200 mg every 12 hours as needed with flutter as much as possible    Please schedule a follow up visit in 3 months but call sooner if needed

## 2019-06-23 DIAGNOSIS — J449 Chronic obstructive pulmonary disease, unspecified: Secondary | ICD-10-CM | POA: Diagnosis not present

## 2019-06-23 DIAGNOSIS — J9611 Chronic respiratory failure with hypoxia: Secondary | ICD-10-CM | POA: Diagnosis not present

## 2019-06-26 ENCOUNTER — Other Ambulatory Visit (INDEPENDENT_AMBULATORY_CARE_PROVIDER_SITE_OTHER): Payer: Medicare HMO

## 2019-06-26 ENCOUNTER — Other Ambulatory Visit: Payer: Self-pay

## 2019-06-26 DIAGNOSIS — E538 Deficiency of other specified B group vitamins: Secondary | ICD-10-CM

## 2019-06-26 DIAGNOSIS — Z0000 Encounter for general adult medical examination without abnormal findings: Secondary | ICD-10-CM | POA: Diagnosis not present

## 2019-06-26 DIAGNOSIS — R7302 Impaired glucose tolerance (oral): Secondary | ICD-10-CM | POA: Diagnosis not present

## 2019-06-26 DIAGNOSIS — E559 Vitamin D deficiency, unspecified: Secondary | ICD-10-CM

## 2019-06-26 LAB — URINALYSIS, ROUTINE W REFLEX MICROSCOPIC
Ketones, ur: NEGATIVE
Nitrite: POSITIVE — AB
Specific Gravity, Urine: 1.02 (ref 1.000–1.030)
Urine Glucose: NEGATIVE
Urobilinogen, UA: 0.2 (ref 0.0–1.0)
pH: 5.5 (ref 5.0–8.0)

## 2019-06-26 LAB — VITAMIN D 25 HYDROXY (VIT D DEFICIENCY, FRACTURES): VITD: 50.87 ng/mL (ref 30.00–100.00)

## 2019-06-26 LAB — BASIC METABOLIC PANEL
BUN: 27 mg/dL — ABNORMAL HIGH (ref 6–23)
CO2: 26 mEq/L (ref 19–32)
Calcium: 8.9 mg/dL (ref 8.4–10.5)
Chloride: 103 mEq/L (ref 96–112)
Creatinine, Ser: 1.43 mg/dL (ref 0.40–1.50)
GFR: 47.78 mL/min — ABNORMAL LOW (ref >60.00)
Glucose, Bld: 135 mg/dL — ABNORMAL HIGH (ref 70–99)
Potassium: 3.8 mEq/L (ref 3.5–5.1)
Sodium: 138 mEq/L (ref 135–145)

## 2019-06-26 LAB — HEPATIC FUNCTION PANEL
ALT: 9 U/L (ref 0–53)
AST: 9 U/L (ref 0–37)
Albumin: 3.9 g/dL (ref 3.5–5.2)
Alkaline Phosphatase: 35 U/L — ABNORMAL LOW (ref 39–117)
Bilirubin, Direct: 0.1 mg/dL (ref 0.0–0.3)
Total Bilirubin: 0.4 mg/dL (ref 0.2–1.2)
Total Protein: 6.3 g/dL (ref 6.0–8.3)

## 2019-06-26 LAB — PSA: PSA: 2.59 ng/mL (ref 0.10–4.00)

## 2019-06-26 LAB — CBC WITH DIFFERENTIAL/PLATELET
Basophils Absolute: 0 10*3/uL (ref 0.0–0.1)
Basophils Relative: 0.3 % (ref 0.0–3.0)
Eosinophils Absolute: 0.2 10*3/uL (ref 0.0–0.7)
Eosinophils Relative: 1.4 % (ref 0.0–5.0)
HCT: 40 % (ref 39.0–52.0)
Hemoglobin: 13 g/dL (ref 13.0–17.0)
Lymphocytes Relative: 12.9 % (ref 12.0–46.0)
Lymphs Abs: 1.6 10*3/uL (ref 0.7–4.0)
MCHC: 32.5 g/dL (ref 30.0–36.0)
MCV: 86.3 fl (ref 78.0–100.0)
Monocytes Absolute: 0.8 10*3/uL (ref 0.1–1.0)
Monocytes Relative: 6.4 % (ref 3.0–12.0)
Neutro Abs: 9.5 10*3/uL — ABNORMAL HIGH (ref 1.4–7.7)
Neutrophils Relative %: 79 % — ABNORMAL HIGH (ref 43.0–77.0)
Platelets: 253 10*3/uL (ref 150.0–400.0)
RBC: 4.63 Mil/uL (ref 4.22–5.81)
RDW: 15.8 % — ABNORMAL HIGH (ref 11.5–15.5)
WBC: 12 10*3/uL — ABNORMAL HIGH (ref 4.0–10.5)

## 2019-06-26 LAB — TSH: TSH: 2.68 u[IU]/mL (ref 0.35–4.50)

## 2019-06-26 LAB — LIPID PANEL
Cholesterol: 115 mg/dL (ref 0–200)
HDL: 50.6 mg/dL (ref >39.00)
LDL Cholesterol: 45 mg/dL (ref 0–99)
NonHDL: 64.13
Total CHOL/HDL Ratio: 2
Triglycerides: 96 mg/dL (ref 0.0–149.0)
VLDL: 19.2 mg/dL (ref 0.0–40.0)

## 2019-06-26 LAB — HEMOGLOBIN A1C: Hgb A1c MFr Bld: 6.4 % (ref 4.6–6.5)

## 2019-06-26 LAB — VITAMIN B12: Vitamin B-12: 928 pg/mL — ABNORMAL HIGH (ref 211–911)

## 2019-07-02 ENCOUNTER — Ambulatory Visit: Payer: Medicare HMO | Admitting: Internal Medicine

## 2019-07-03 ENCOUNTER — Ambulatory Visit (INDEPENDENT_AMBULATORY_CARE_PROVIDER_SITE_OTHER): Payer: Medicare HMO | Admitting: Internal Medicine

## 2019-07-03 ENCOUNTER — Encounter: Payer: Self-pay | Admitting: Internal Medicine

## 2019-07-03 ENCOUNTER — Other Ambulatory Visit: Payer: Self-pay

## 2019-07-03 ENCOUNTER — Ambulatory Visit (INDEPENDENT_AMBULATORY_CARE_PROVIDER_SITE_OTHER): Payer: Medicare HMO

## 2019-07-03 VITALS — BP 110/70 | HR 91 | Temp 97.9°F | Resp 16 | Ht 67.0 in | Wt 176.0 lb

## 2019-07-03 VITALS — BP 120/72 | HR 74 | Temp 97.8°F | Ht 67.0 in | Wt 176.0 lb

## 2019-07-03 DIAGNOSIS — Z0000 Encounter for general adult medical examination without abnormal findings: Secondary | ICD-10-CM | POA: Diagnosis not present

## 2019-07-03 DIAGNOSIS — J449 Chronic obstructive pulmonary disease, unspecified: Secondary | ICD-10-CM

## 2019-07-03 DIAGNOSIS — N183 Chronic kidney disease, stage 3 unspecified: Secondary | ICD-10-CM

## 2019-07-03 DIAGNOSIS — E559 Vitamin D deficiency, unspecified: Secondary | ICD-10-CM

## 2019-07-03 DIAGNOSIS — I10 Essential (primary) hypertension: Secondary | ICD-10-CM

## 2019-07-03 DIAGNOSIS — N390 Urinary tract infection, site not specified: Secondary | ICD-10-CM | POA: Diagnosis not present

## 2019-07-03 DIAGNOSIS — E785 Hyperlipidemia, unspecified: Secondary | ICD-10-CM

## 2019-07-03 DIAGNOSIS — E538 Deficiency of other specified B group vitamins: Secondary | ICD-10-CM

## 2019-07-03 DIAGNOSIS — Z1159 Encounter for screening for other viral diseases: Secondary | ICD-10-CM

## 2019-07-03 DIAGNOSIS — R7302 Impaired glucose tolerance (oral): Secondary | ICD-10-CM

## 2019-07-03 DIAGNOSIS — Z0001 Encounter for general adult medical examination with abnormal findings: Secondary | ICD-10-CM

## 2019-07-03 MED ORDER — CIPROFLOXACIN HCL 500 MG PO TABS
500.0000 mg | ORAL_TABLET | Freq: Two times a day (BID) | ORAL | 0 refills | Status: AC
Start: 2019-07-03 — End: 2019-07-13

## 2019-07-03 NOTE — Patient Instructions (Signed)
Justin Walters , Thank you for taking time to come for your Medicare Wellness Visit. I appreciate your ongoing commitment to your health goals. Please review the following plan we discussed and let me know if I can assist you in the future.   Screening recommendations/referrals: Colonoscopy: last done 12/04/2013; 7 year plan Recommended yearly ophthalmology/optometry visit for glaucoma screening and checkup Recommended yearly dental visit for hygiene and checkup  Vaccinations: Influenza vaccine: 09/04/2018 Pneumococcal vaccine: completed Tdap vaccine: 07/30/2018 Shingles vaccine: completed   Covid-19: completed  Advanced directives: Advance directive discussed with you today. Even though you declined this today please call our office should you change your mind and we can give you the proper paperwork for you to fill out.  Conditions/risks identified: Please continue to do your personal lifestyle choices by: daily care of teeth and gums, regular physical activity (goal should be 5 days a week for 30 minutes), eat a healthy diet, avoid tobacco and drug use, limiting any alcohol intake, taking a low-dose aspirin (if not allergic or have been advised by your provider otherwise) and taking vitamins and minerals as recommended by your provider. Continue doing brain stimulating activities (puzzles, reading, adult coloring books, staying active) to keep memory sharp. Continue to eat heart healthy diet (full of fruits, vegetables, whole grains, lean protein, water--limit salt, fat, and sugar intake) and increase physical activity as tolerated.  Next appointment: Please schedule your next Medicare Wellness Visit with your Nurse Health Advisor in 1 year.  Preventive Care 75 Years and Older, Male Preventive care refers to lifestyle choices and visits with your health care provider that can promote health and wellness. What does preventive care include?  A yearly physical exam. This is also called an annual  well check.  Dental exams once or twice a year.  Routine eye exams. Ask your health care provider how often you should have your eyes checked.  Personal lifestyle choices, including:  Daily care of your teeth and gums.  Regular physical activity.  Eating a healthy diet.  Avoiding tobacco and drug use.  Limiting alcohol use.  Practicing safe sex.  Taking low doses of aspirin every day.  Taking vitamin and mineral supplements as recommended by your health care provider. What happens during an annual well check? The services and screenings done by your health care provider during your annual well check will depend on your age, overall health, lifestyle risk factors, and family history of disease. Counseling  Your health care provider may ask you questions about your:  Alcohol use.  Tobacco use.  Drug use.  Emotional well-being.  Home and relationship well-being.  Sexual activity.  Eating habits.  History of falls.  Memory and ability to understand (cognition).  Work and work Statistician. Screening  You may have the following tests or measurements:  Height, weight, and BMI.  Blood pressure.  Lipid and cholesterol levels. These may be checked every 5 years, or more frequently if you are over 73 years old.  Skin check.  Lung cancer screening. You may have this screening every year starting at age 5 if you have a 30-pack-year history of smoking and currently smoke or have quit within the past 15 years.  Fecal occult blood test (FOBT) of the stool. You may have this test every year starting at age 68.  Flexible sigmoidoscopy or colonoscopy. You may have a sigmoidoscopy every 5 years or a colonoscopy every 10 years starting at age 59.  Prostate cancer screening. Recommendations will vary depending on  your family history and other risks.  Hepatitis C blood test.  Hepatitis B blood test.  Sexually transmitted disease (STD) testing.  Diabetes screening.  This is done by checking your blood sugar (glucose) after you have not eaten for a while (fasting). You may have this done every 1-3 years.  Abdominal aortic aneurysm (AAA) screening. You may need this if you are a current or former smoker.  Osteoporosis. You may be screened starting at age 59 if you are at high risk. Talk with your health care provider about your test results, treatment options, and if necessary, the need for more tests. Vaccines  Your health care provider may recommend certain vaccines, such as:  Influenza vaccine. This is recommended every year.  Tetanus, diphtheria, and acellular pertussis (Tdap, Td) vaccine. You may need a Td booster every 10 years.  Zoster vaccine. You may need this after age 23.  Pneumococcal 13-valent conjugate (PCV13) vaccine. One dose is recommended after age 63.  Pneumococcal polysaccharide (PPSV23) vaccine. One dose is recommended after age 36. Talk to your health care provider about which screenings and vaccines you need and how often you need them. This information is not intended to replace advice given to you by your health care provider. Make sure you discuss any questions you have with your health care provider. Document Released: 01/15/2015 Document Revised: 09/08/2015 Document Reviewed: 10/20/2014 Elsevier Interactive Patient Education  2017 [place] Prevention in the Home Falls can cause injuries. They can happen to people of all ages. There are many things you can do to make your home safe and to help prevent falls. What can I do on the outside of my home?  Regularly fix the edges of walkways and driveways and fix any cracks.  Remove anything that might make you trip as you walk through a door, such as a raised step or threshold.  Trim any bushes or trees on the path to your home.  Use bright outdoor lighting.  Clear any walking paths of anything that might make someone trip, such as rocks or tools.  Regularly  check to see if handrails are loose or broken. Make sure that both sides of any steps have handrails.  Any raised decks and porches should have guardrails on the edges.  Have any leaves, snow, or ice cleared regularly.  Use sand or salt on walking paths during winter.  Clean up any spills in your garage right away. This includes oil or grease spills. What can I do in the bathroom?  Use night lights.  Install grab bars by the toilet and in the tub and shower. Do not use towel bars as grab bars.  Use non-skid mats or decals in the tub or shower.  If you need to sit down in the shower, use a plastic, non-slip stool.  Keep the floor dry. Clean up any water that spills on the floor as soon as it happens.  Remove soap buildup in the tub or shower regularly.  Attach bath mats securely with double-sided non-slip rug tape.  Do not have throw rugs and other things on the floor that can make you trip. What can I do in the bedroom?  Use night lights.  Make sure that you have a light by your bed that is easy to reach.  Do not use any sheets or blankets that are too big for your bed. They should not hang down onto the floor.  Have a firm chair that has side arms. You  can use this for support while you get dressed.  Do not have throw rugs and other things on the floor that can make you trip. What can I do in the kitchen?  Clean up any spills right away.  Avoid walking on wet floors.  Keep items that you use a lot in easy-to-reach places.  If you need to reach something above you, use a strong step stool that has a grab bar.  Keep electrical cords out of the way.  Do not use floor polish or wax that makes floors slippery. If you must use wax, use non-skid floor wax.  Do not have throw rugs and other things on the floor that can make you trip. What can I do with my stairs?  Do not leave any items on the stairs.  Make sure that there are handrails on both sides of the stairs and  use them. Fix handrails that are broken or loose. Make sure that handrails are as long as the stairways.  Check any carpeting to make sure that it is firmly attached to the stairs. Fix any carpet that is loose or worn.  Avoid having throw rugs at the top or bottom of the stairs. If you do have throw rugs, attach them to the floor with carpet tape.  Make sure that you have a light switch at the top of the stairs and the bottom of the stairs. If you do not have them, ask someone to add them for you. What else can I do to help prevent falls?  Wear shoes that:  Do not have high heels.  Have rubber bottoms.  Are comfortable and fit you well.  Are closed at the toe. Do not wear sandals.  If you use a stepladder:  Make sure that it is fully opened. Do not climb a closed stepladder.  Make sure that both sides of the stepladder are locked into place.  Ask someone to hold it for you, if possible.  Clearly mark and make sure that you can see:  Any grab bars or handrails.  First and last steps.  Where the edge of each step is.  Use tools that help you move around (mobility aids) if they are needed. These include:  Canes.  Walkers.  Scooters.  Crutches.  Turn on the lights when you go into a dark area. Replace any light bulbs as soon as they burn out.  Set up your furniture so you have a clear path. Avoid moving your furniture around.  If any of your floors are uneven, fix them.  If there are any pets around you, be aware of where they are.  Review your medicines with your doctor. Some medicines can make you feel dizzy. This can increase your chance of falling. Ask your doctor what other things that you can do to help prevent falls. This information is not intended to replace advice given to you by your health care provider. Make sure you discuss any questions you have with your health care provider. Document Released: 10/15/2008 Document Revised: 05/27/2015 Document  Reviewed: 01/23/2014 Elsevier Interactive Patient Education  2017 [employer].

## 2019-07-03 NOTE — Assessment & Plan Note (Signed)
stable overall by history and exam, recent data reviewed with pt, and pt to continue medical treatment as before,  to f/u any worsening symptoms or concerns  

## 2019-07-03 NOTE — Patient Instructions (Addendum)
Please take all new medication as prescribed - the antibiotic  You will be contacted regarding the referral for: urology  Please continue all other medications as before, and refills have been done if requested.  Please have the pharmacy call with any other refills you may need.  Please continue your efforts at being more active, low cholesterol diet, and weight control.  You are otherwise up to date with prevention measures today.  Please keep your appointments with your specialists as you may have planned  Please make an Appointment to return in 6 months, or sooner if needed, also with Lab Appointment for testing done 3-5 days before at the [facility] (so this is for TWO appointments - please see the scheduling desk as you leave)

## 2019-07-03 NOTE — Progress Notes (Signed)
Subjective:   Justin Walters is a 79 y.o. male who presents for Medicare Annual/Subsequent preventive examination.  Review of Systems    No ROS. Medicare Wellness Visit. Additional risk factors are reflected in social history. Cardiac Risk Factors include: advanced age (>32men, >100 women);dyslipidemia;family history of premature cardiovascular disease;hypertension;male gender     Objective:    Today's Vitals   07/03/19 0815  BP: 110/70  Pulse: 91  Resp: 16  Temp: 97.9 F (36.6 C)  SpO2: 96%  Weight: 176 lb (79.8 kg)  Height: 5\' 7"  (1.702 m)  PainSc: 0-No pain   Body mass index is 27.57 kg/m.  Advanced Directives 07/03/2019 03/26/2018 03/30/2017 01/08/2017 01/05/2017 01/05/2017 01/04/2017  Does Patient Have a Medical Advance Directive? No No No Yes No No No  Type of Advance Directive - - Public librarian;Living will - - -  Does patient want to make changes to medical advance directive? - - - No - Patient declined - - -  Copy of [place] in Chart? - - - No - copy requested No - copy requested No - copy requested -  Would patient like information on creating a medical advance directive? No - Patient declined No - Patient declined No - Patient declined No - Patient declined - - No - Patient declined    Current Medications (verified) Outpatient Encounter Medications as of 07/03/2019  Medication Sig   acetaminophen (TYLENOL) 325 MG tablet Take 650 mg by mouth every 6 (six) hours as needed for mild pain or headache.   albuterol (PROVENTIL HFA;VENTOLIN HFA) 108 (90 Base) MCG/ACT inhaler INHALE 2 PUFFS INTO THE LUNGS EVERY 6 (SIX) HOURS AS NEEDED FOR WHEEZING OR SHORTNESS OF BREATH.   albuterol (PROVENTIL) (2.5 MG/3ML) 0.083% nebulizer solution Take 3 mLs (2.5 mg total) by nebulization every 4 (four) hours as needed for wheezing or shortness of breath.   aspirin 81 MG EC tablet Take 1 tablet (81 mg total) by mouth daily. Swallow whole.   calcium-vitamin D  (OSCAL WITH D) 500-200 MG-UNIT tablet Take 1 tablet by mouth. 50 mcg once a day   cholecalciferol (VITAMIN D3) 25 MCG (1000 UNIT) tablet Take 2,000 Units by mouth daily.   diclofenac Sodium (VOLTAREN) 1 % GEL    diphenhydrAMINE (BENADRYL) 25 MG tablet Take 50 mg by mouth every 6 (six) hours as needed.    famotidine (PEPCID) 20 MG tablet Take 20 mg by mouth at bedtime.   Fluticasone-Umeclidin-Vilant (TRELEGY ELLIPTA) 100-62.5-25 MCG/INH AEPB Inhale 1 puff into the lungs daily.   gabapentin (NEURONTIN) 300 MG capsule Take one capsule by mouth three times daily   Guaifenesin (MUCINEX MAXIMUM STRENGTH) 1200 MG TB12 Take 1 tablet by mouth 2 (two) times daily.   pantoprazole (PROTONIX) 40 MG tablet TAKE 1 TABLET BY MOUTH ONCE DAILY 30-60  MINUTES  BEFORE  FIRST  MEAL  OF  THE  DAY   predniSONE (DELTASONE) 10 MG tablet Prednisne 10 mg  X 2 each am until better, then 1 daily x 3 days then one half daily until return to office   Respiratory Therapy Supplies (FLUTTER) DEVI Use as directed   rosuvastatin (CRESTOR) 20 MG tablet TAKE 1 TABLET EVERY DAY   Saccharomyces boulardii (PROBIOTIC) 250 MG CAPS Take 1 capsule by mouth daily.   tamsulosin (FLOMAX) 0.4 MG CAPS capsule Take 0.4 mg by mouth daily.   vitamin B-12 (CYANOCOBALAMIN) 1000 MCG tablet Take 1 tablet (1,000 mcg total) by mouth daily.  No facility-administered encounter medications on file as of 07/03/2019.    Allergies (verified) Patient has no known allergies.   History: Past Medical History:  Diagnosis Date   At risk for sleep apnea    STOP-BANG= 5    SENT TO PCP 06-13-2013   Bladder stone    BPH (benign prostatic hyperplasia)    COPD (chronic obstructive pulmonary disease) (HCC)    Diverticulosis of colon    Dysuria    Glucose intolerance (impaired glucose tolerance)    History of kidney stones    Hyperlipidemia    Hypertension    Ureteral disorder 2018   ureteral repair   Past Surgical History:    Procedure Laterality Date   APPENDECTOMY  1980's   CATARACT EXTRACTION W/ INTRAOCULAR LENS  IMPLANT, BILATERAL     COLONOSCOPY  10-05-2003   tics only    CYSTOSCOPY N/A 06/23/2013   Procedure: CYSTOSCOPY WITH LITHOLAPAXY, BLADDER EXPLORATION, REPAIR BLADDER PERFORATION;  Surgeon: Arvil Persons, MD;  Location: [place];  Service: Urology;  Laterality: N/A;   CYSTOSCOPY/URETEROSCOPY/HOLMIUM LASER/STENT PLACEMENT Left 04/03/2017   Procedure: CYSTOSCOPY LEFT RETROGRADE/LEFT /URETEROSCOPY/HOLMIUM LASER/BASKET STONE EXTRACTION/STENT PLACEMENT/ URETHRAL BIOPSY;  Surgeon: [name], MD;  Location: [facility];  Service: Urology;  Laterality: Left;   EXTRACORPOREAL SHOCK WAVE LITHOTRIPSY Left 10/23/2016   Procedure: LEFT EXTRACORPOREAL SHOCK WAVE LITHOTRIPSY (ESWL);  Surgeon: Nickie Retort, MD;  Location: [facility];  Service: Urology;  Laterality: Left;   EXTRACORPOREAL SHOCK WAVE LITHOTRIPSY Left 01/08/2017   Procedure: LEFT EXTRACORPOREAL SHOCK WAVE LITHOTRIPSY (ESWL);  Surgeon: [name], MD;  Location: [facility];  Service: Urology;  Laterality: Left;   HOLMIUM LASER APPLICATION N/A 1/49/7026   Procedure: HOLMIUM LASER APPLICATION;  Surgeon: Arvil Persons, MD;  Location[facility];  Service: Urology;  Laterality: N/A;   LAPAROSCOPIC INGUINAL HERNIA REPAIR Bilateral 10-09-2003   TRANSURETHRAL RESECTION OF PROSTATE N/A 06/23/2013   Procedure: TRANSURETHRAL RESECTION OF THE PROSTATE;  Surgeon: Arvil Persons, MD;  Location: [facility];  Service: Urology;  Laterality: N/A;   Family History  Problem Relation Age of Onset   COPD Mother    Cancer Father        unknown   Emphysema Brother    Heart failure Brother    Colon cancer Neg Hx    Esophageal cancer Neg Hx    Rectal cancer Neg Hx    Stomach cancer Neg Hx    Social History   Socioeconomic History   Marital status: Widowed    Spouse name: Not on file   Number of  children: 3   Years of education: Not on file   Highest education level: Not on file  Occupational History   Occupation: Retired ([employer])  Tobacco Use   Smoking status: Former Smoker    Packs/day: 3.00    Years: 34.00    Pack years: 102.00    Types: Cigarettes    Quit date: 01/02/1993    Years since quitting: 26.5   Smokeless tobacco: Never Used  Scientific laboratory technician Use: Never used  Substance and Sexual Activity   Alcohol use: No    Alcohol/week: 4.0 standard drinks    Types: 4 Standard drinks or equivalent per week   Drug use: No   Sexual activity: Not Currently    Partners: Female  Other Topics Concern   Not on file  Social History Narrative   Not on file   Social  Determinants of Health   Financial Resource Strain: Low Risk    Difficulty of Paying Living Expenses: Not hard at all  Food Insecurity: No Food Insecurity   Worried About [place] in the Last Year: Never true   Ran Out of Food in the Last Year: Never true  Transportation Needs: No Transportation Needs   Lack of Transportation (Medical): No   Lack of Transportation (Non-Medical): No  Physical Activity: Unknown   Days of Exercise per Week: Patient refused   Minutes of Exercise per Session: Patient refused  Stress: No Stress Concern Present   Feeling of Stress : Not at all  Social Connections: Unknown   Frequency of Communication with Friends and Family: Patient refused   Frequency of Social Gatherings with Friends and Family: Patient refused   Attends Religious Services: Patient refused   Marine scientist or Organizations: Patient refused   Attends Music therapist: Patient refused   Marital Status: Patient refused    Tobacco Counseling Counseling given: No   Clinical Intake:  Pre-visit preparation completed: Yes  Pain : No/denies pain Pain Score: 0-No pain     BMI - recorded: 27.57 Nutritional Risks: None Diabetes:  No  How often do you need to have someone help you when you read instructions, pamphlets, or other written materials from your doctor or pharmacy?: 1 - Never What is the last grade level you completed in school?: GED  Diabetic? no  Interpreter Needed?: No  Information entered by :: Amarii Amy N. Deklyn Trachtenberg, LPN   Activities of Daily Living In your present state of health, do you have any difficulty performing the following activities: 07/03/2019  Hearing? N  Vision? N  Difficulty concentrating or making decisions? N  Walking or climbing stairs? N  Dressing or bathing? N  Doing errands, shopping? N  Preparing Food and eating ? N  Using the Toilet? N  In the past six months, have you accidently leaked urine? N  Do you have problems with loss of bowel control? N  Managing your Medications? N  Managing your Finances? N  Housekeeping or managing your Housekeeping? N  Some recent data might be hidden    Patient Care Team: Biagio Borg, MD as PCP - General [name], MD as Consulting Physician (Urology) Odis Luster, Huey Bienenstock, MD as Referring Physician (Urology)  Indicate any recent Medical Services you may have received from other than [facility] providers in the past year (date may be approximate).     Assessment:   This is a routine wellness examination for Justin Walters.  Hearing/Vision screen No exam data present  Dietary issues and exercise activities discussed: Current Exercise Habits: The patient does not participate in regular exercise at present, Exercise limited by: None identified  Goals      <enter goal here> (pt-stated)      "breathe better"       Client understands the importance of follow-up with providers by attending scheduled visits (pt-stated)      To stay alive       Patient Stated      Maintain current health status. Stay as healthy and as independent as possible      Depression Screen PHQ 2/9 Scores 07/03/2019 01/30/2019 07/30/2018 07/30/2018 07/25/2017  01/04/2017 12/30/2015  PHQ - 2 Score 0 0 0 0 0 0 0  PHQ- 9 Score - - - 0 0 1 -    Fall Risk Fall Risk  07/03/2019 01/30/2019 07/30/2018 07/25/2017 01/04/2017  Falls  in the past year? 0 0 0 No No  Number falls in past yr: 0 - - - -  Injury with Fall? 0 - - - -  Risk for fall due to : No Fall Risks - - - -  Risk for fall due to: Comment - - - - -  Follow up Falls evaluation completed - - - -    Any stairs in or around the home? No  If so, are there any without handrails? No  Home free of loose throw rugs in walkways, pet beds, electrical cords, etc? Yes  Adequate lighting in your home to reduce risk of falls? Yes   ASSISTIVE DEVICES UTILIZED TO PREVENT FALLS:  Life alert? No  Use of a cane, walker or w/c? No  Grab bars in the bathroom? No  Shower chair or bench in shower? No  Elevated toilet seat or a handicapped toilet? No   TIMED UP AND GO:  Was the test performed? No .  Length of time to ambulate 10 feet: 0 sec.   Gait steady and fast without use of assistive device  Cognitive Function: MMSE - Mini Mental State Exam 01/04/2017  Orientation to time 5  Orientation to Place 5  Registration 3  Attention/ Calculation 5  Recall 1  Language- name 2 objects 2  Language- repeat 1  Language- follow 3 step command 3  Language- read & follow direction 1  Write a sentence 1  Copy design 1  Total score 28     6CIT Screen 07/03/2019  What Year? 0 points  What month? 0 points  What time? 0 points  Count back from 20 0 points  Months in reverse 0 points  Repeat phrase 0 points  Total Score 0    Immunizations Immunization History  Administered Date(s) Administered   Influenza Whole 11/30/2006, 10/03/2007, 12/11/2008, 10/13/2009   Influenza, High Dose Seasonal PF 12/30/2015, 11/15/2016, 10/01/2017   Influenza,inj,Quad PF,6+ Mos 11/17/2014   PFIZER SARS-COV-2 Vaccination 03/09/2019, 04/08/2019   Pneumococcal Conjugate-13 05/01/2013   Pneumococcal Polysaccharide-23 10/03/2007    Td 10/03/2007   Tdap 07/30/2018   Zoster 10/03/2007   Zoster Recombinat (Shingrix) 07/25/2017, 12/03/2017    TDAP status: Up to date Flu Vaccine status: Up to date Pneumococcal vaccine status: Up to date Covid-19 vaccine status: Completed vaccines  Qualifies for Shingles Vaccine? Yes   Zostavax completed Yes   Shingrix Completed?: Yes  Screening Tests Health Maintenance  Topic Date Due   Hepatitis C Screening  Never done   INFLUENZA VACCINE  08/03/2019   COLONOSCOPY  12/04/2020   TETANUS/TDAP  07/29/2028   COVID-19 Vaccine  Completed   PNA vac Low Risk Adult  Completed    Health Maintenance  Health Maintenance Due  Topic Date Due   Hepatitis C Screening  Never done    Colorectal cancer screening: Completed 12/04/2013. Repeat every 7 years  Lung Cancer Screening: (Low Dose CT Chest recommended if Age 81-80 years, 30 pack-year currently smoking OR have quit w/in 15years.) does not qualify.   Lung Cancer Screening Referral: no  Additional Screening:  Hepatitis C Screening: does not qualify; Completed no  Vision Screening: Recommended annual ophthalmology exams for early detection of glaucoma and other disorders of the eye. Is the patient up to date with their annual eye exam?  No  Who is the provider or what is the name of the office in which the patient attends annual eye exams? No eye doctor If pt is not established with  a provider, would they like to be referred to a provider to establish care? Yes .   Dental Screening: Recommended annual dental exams for proper oral hygiene  Community Resource Referral / Chronic Care Management: CRR required this visit?  Yes   CCM required this visit?  No      Plan:     I have personally reviewed and noted the following in the patients chart:    Medical and social history  Use of alcohol, tobacco or illicit drugs   Current medications and supplements  Functional ability and status  Nutritional  status  Physical activity  Advanced directives  List of other physicians  Hospitalizations, surgeries, and ER visits in previous 12 months  Vitals  Screenings to include cognitive, depression, and falls  Referrals and appointments  In addition, I have reviewed and discussed with patient certain preventive protocols, quality metrics, and best practice recommendations. A written personalized care plan for preventive services as well as general preventive health recommendations were provided to patient.     Sheral Flow, LPN   7/0/3403   Nurse Notes:  Patient is in need for a referral to ophthalmology for eye exam.

## 2019-07-03 NOTE — Assessment & Plan Note (Signed)

## 2019-07-03 NOTE — Assessment & Plan Note (Signed)
Improved, ok to take b12 oral qod

## 2019-07-03 NOTE — Progress Notes (Signed)
Subjective:    Patient ID: Justin Walters, male    DOB: 1940-10-06, 79 y.o.   MRN: [id number]  HPI  Here for wellness and f/u;  Overall doing ok;  Pt denies Chest pain, worsening SOB, DOE, wheezing, orthopnea, PND, worsening LE edema, palpitations, dizziness or syncope.  Pt denies neurological change such as new headache, facial or extremity weakness.  Pt denies polydipsia, polyuria, or low sugar symptoms. Pt states overall good compliance with treatment and medications, good tolerability, and has been trying to follow appropriate diet.  Pt denies worsening depressive symptoms, suicidal ideation or panic. No fever, night sweats, wt loss, loss of appetite, or other constitutional symptoms.  Pt states good ability with ADL's, has low fall risk, home safety reviewed and adequate, no other significant changes in hearing or vision, and only occasionally active with exercise. Denies urinary symptoms such as frequency, urgency, flank pain, hematuria or n/v, fever, chills, but does have a few wks of acute on chronic dysuria and frequqency, needs to see urology again Past Medical History:  Diagnosis Date  . At risk for sleep apnea    STOP-BANG= 5    SENT TO PCP 06-13-2013  . Bladder stone   . BPH (benign prostatic hyperplasia)   . COPD (chronic obstructive pulmonary disease) ([place])   . Diverticulosis of colon   . Dysuria   . Glucose intolerance (impaired glucose tolerance)   . History of kidney stones   . Hyperlipidemia   . Hypertension   . Ureteral disorder 2018   ureteral repair   Past Surgical History:  Procedure Laterality Date  . APPENDECTOMY  1980's  . CATARACT EXTRACTION W/ INTRAOCULAR LENS  IMPLANT, BILATERAL    . COLONOSCOPY  10-05-2003   tics only   . CYSTOSCOPY N/A 06/23/2013   Procedure: CYSTOSCOPY WITH LITHOLAPAXY, BLADDER EXPLORATION, REPAIR BLADDER PERFORATION;  Surgeon: Arvil Persons, MD;  Location: [facility];  Service: Urology;  Laterality: N/A;  .  CYSTOSCOPY/URETEROSCOPY/HOLMIUM LASER/STENT PLACEMENT Left 04/03/2017   Procedure: CYSTOSCOPY LEFT RETROGRADE/LEFT /URETEROSCOPY/HOLMIUM LASER/BASKET STONE EXTRACTION/STENT PLACEMENT/ URETHRAL BIOPSY;  Surgeon: [name], MD;  Location: [facility];  Service: Urology;  Laterality: Left;  . EXTRACORPOREAL SHOCK WAVE LITHOTRIPSY Left 10/23/2016   Procedure: LEFT EXTRACORPOREAL SHOCK WAVE LITHOTRIPSY (ESWL);  Surgeon: Nickie Retort, MD;  Location: [facility];  Service: Urology;  Laterality: Left;  . EXTRACORPOREAL SHOCK WAVE LITHOTRIPSY Left 01/08/2017   Procedure: LEFT EXTRACORPOREAL SHOCK WAVE LITHOTRIPSY (ESWL);  Surgeon: [name], MD;  Location: [facility];  Service: Urology;  Laterality: Left;  . HOLMIUM LASER APPLICATION N/A 08/26/537   Procedure: HOLMIUM LASER APPLICATION;  Surgeon: Arvil Persons, MD;  Location: [facility];  Service: Urology;  Laterality: N/A;  . LAPAROSCOPIC INGUINAL HERNIA REPAIR Bilateral 10-09-2003  . TRANSURETHRAL RESECTION OF PROSTATE N/A 06/23/2013   Procedure: TRANSURETHRAL RESECTION OF THE PROSTATE;  Surgeon: Arvil Persons, MD;  Location: [facility];  Service: Urology;  Laterality: N/A;    reports that he quit smoking about 26 years ago. His smoking use included cigarettes. He has a 102.00 pack-year smoking history. He has never used smokeless tobacco. He reports that he does not drink alcohol and does not use drugs. family history includes COPD in his mother; Cancer in his father; Emphysema in his brother; Heart failure in his brother. No Known Allergies Current Outpatient Medications on File Prior to Visit  Medication Sig Dispense Refill  . acetaminophen (TYLENOL) 325 MG tablet Take 650 mg by  mouth every 6 (six) hours as needed for mild pain or headache.    . albuterol (PROVENTIL HFA;VENTOLIN HFA) 108 (90 Base) MCG/ACT inhaler INHALE 2 PUFFS INTO THE LUNGS EVERY 6 (SIX) HOURS AS NEEDED FOR WHEEZING OR SHORTNESS OF BREATH. 54 g 3  .  albuterol (PROVENTIL) (2.5 MG/3ML) 0.083% nebulizer solution Take 3 mLs (2.5 mg total) by nebulization every 4 (four) hours as needed for wheezing or shortness of breath. 120 mL 11  . aspirin 81 MG EC tablet Take 1 tablet (81 mg total) by mouth daily. Swallow whole. 30 tablet 12  . calcium-vitamin D (OSCAL WITH D) 500-200 MG-UNIT tablet Take 1 tablet by mouth. 50 mcg once a day    . cholecalciferol (VITAMIN D3) 25 MCG (1000 UNIT) tablet Take 2,000 Units by mouth daily.    . diclofenac Sodium (VOLTAREN) 1 % GEL     . diphenhydrAMINE (BENADRYL) 25 MG tablet Take 50 mg by mouth every 6 (six) hours as needed.     . doxylamine, Sleep, (UNISOM) 25 MG tablet Take 50 mg by mouth at bedtime as needed for sleep.    . famotidine (PEPCID) 20 MG tablet Take 20 mg by mouth at bedtime.    . Fluticasone-Umeclidin-Vilant (TRELEGY ELLIPTA) 100-62.5-25 MCG/INH AEPB Inhale 1 puff into the lungs daily. 1 each 11  . gabapentin (NEURONTIN) 300 MG capsule Take one capsule by mouth three times daily 30 capsule 0  . Guaifenesin (MUCINEX MAXIMUM STRENGTH) 1200 MG TB12 Take 1 tablet by mouth 2 (two) times daily.    . pantoprazole (PROTONIX) 40 MG tablet TAKE 1 TABLET BY MOUTH ONCE DAILY 30-60  MINUTES  BEFORE  FIRST  MEAL  OF  THE  DAY 90 tablet 1  . predniSONE (DELTASONE) 10 MG tablet Prednisne 10 mg  X 2 each am until better, then 1 daily x 3 days then one half daily until return to office 100 tablet 0  . Respiratory Therapy Supplies (FLUTTER) DEVI Use as directed 1 each 0  . rosuvastatin (CRESTOR) 20 MG tablet TAKE 1 TABLET EVERY DAY 90 tablet 1  . Saccharomyces boulardii (PROBIOTIC) 250 MG CAPS Take 1 capsule by mouth daily.    . tamsulosin (FLOMAX) 0.4 MG CAPS capsule Take 0.4 mg by mouth daily.    . vitamin B-12 (CYANOCOBALAMIN) 1000 MCG tablet Take 1 tablet (1,000 mcg total) by mouth daily. 90 tablet 1   No current facility-administered medications on file prior to visit.   Review of Systems All otherwise neg per  pt     Objective:   Physical Exam BP 120/72 (BP Location: Left Arm, Patient Position: Sitting, Cuff Size: Large)   Pulse 74   Temp 97.8 F (36.6 C) (Oral)   Ht 5\' 7"  (1.702 m)   Wt 176 lb (79.8 kg)   SpO2 97%   BMI 27.57 kg/m  VS noted,  Constitutional: Pt appears in NAD HENT: Head: NCAT.  Right Ear: External ear normal.  Left Ear: External ear normal.  Eyes: . Pupils are equal, round, and reactive to light. Conjunctivae and EOM are normal Nose: without d/c or deformity Neck: Neck supple. Gross normal ROM Cardiovascular: Normal rate and regular rhythm.   Pulmonary/Chest: Effort normal and breath sounds without rales or wheezing.  Abd:  Soft, NT, ND, + BS, no organomegaly Neurological: Pt is alert. At baseline orientation, motor grossly intact Skin: Skin is warm. No rashes, other new lesions, no LE edema Psychiatric: Pt behavior is normal without agitation  All otherwise  neg per pt Lab Results  Component Value Date   WBC 12.0 (H) 06/26/2019   HGB 13.0 06/26/2019   HCT 40.0 06/26/2019   PLT 253.0 06/26/2019   GLUCOSE 135 (H) 06/26/2019   CHOL 115 06/26/2019   TRIG 96.0 06/26/2019   HDL 50.60 06/26/2019   LDLDIRECT 161.9 03/28/2012   LDLCALC 45 06/26/2019   ALT 9 06/26/2019   AST 9 06/26/2019   NA 138 06/26/2019   K 3.8 06/26/2019   CL 103 06/26/2019   CREATININE 1.43 06/26/2019   BUN 27 (H) 06/26/2019   CO2 26 06/26/2019   TSH 2.68 06/26/2019   PSA 2.59 06/26/2019   HGBA1C 6.4 06/26/2019         Assessment & Plan:

## 2019-07-03 NOTE — Assessment & Plan Note (Addendum)
Mild to mod, for antibx course,  to f/u any worsening symptoms or concerns  I spent 31 minutes in addition to time for CPX wellness examination in preparing to see the patient by review of recent labs, imaging and procedures, obtaining and reviewing separately obtained history, communicating with the patient and family or caregiver, ordering medications, tests or procedures, and documenting clinical information in the EHR including the differential Dx, treatment, and any further evaluation and other management of uti, hyperglycemia, hld, htn, copd, ckd, b12 and d deficiency

## 2019-07-23 DIAGNOSIS — J449 Chronic obstructive pulmonary disease, unspecified: Secondary | ICD-10-CM | POA: Diagnosis not present

## 2019-07-23 DIAGNOSIS — J9611 Chronic respiratory failure with hypoxia: Secondary | ICD-10-CM | POA: Diagnosis not present

## 2019-08-07 DIAGNOSIS — H524 Presbyopia: Secondary | ICD-10-CM | POA: Diagnosis not present

## 2019-08-18 ENCOUNTER — Other Ambulatory Visit: Payer: Self-pay | Admitting: Internal Medicine

## 2019-08-18 ENCOUNTER — Encounter: Payer: Self-pay | Admitting: Internal Medicine

## 2019-08-19 ENCOUNTER — Telehealth: Payer: Self-pay | Admitting: Internal Medicine

## 2019-08-19 MED ORDER — PREDNISONE 10 MG PO TABS: ORAL_TABLET | ORAL | 0 refills | Status: DC

## 2019-08-19 NOTE — Telephone Encounter (Signed)
That's fine do pred 10 mg x 2 until better then 1 daily and if worse back to 2 until better etc  Give #100

## 2019-08-19 NOTE — Telephone Encounter (Signed)
Spoke with pt, aware of recs.  Refill sent to pharmacy.  Nothing further needed at this time- will close encounter.

## 2019-08-19 NOTE — Telephone Encounter (Signed)
Spoke with pt, states that he is concerned about being on prednisone just PRN.  States that he was doing well when he took maintenance prednisone and now that he's only to take it PRN when feeling sick he states that it takes him a long time to recover.  Wants MW to consider putting him back on maintenance prednisone.  MW please advise, thanks!

## 2019-08-23 DIAGNOSIS — J9611 Chronic respiratory failure with hypoxia: Secondary | ICD-10-CM | POA: Diagnosis not present

## 2019-08-23 DIAGNOSIS — J449 Chronic obstructive pulmonary disease, unspecified: Secondary | ICD-10-CM | POA: Diagnosis not present

## 2019-08-27 DIAGNOSIS — H52223 Regular astigmatism, bilateral: Secondary | ICD-10-CM | POA: Diagnosis not present

## 2019-08-27 DIAGNOSIS — H524 Presbyopia: Secondary | ICD-10-CM | POA: Diagnosis not present

## 2019-09-11 ENCOUNTER — Ambulatory Visit: Payer: Medicare HMO | Admitting: Internal Medicine

## 2019-09-12 IMAGING — US US RENAL
1 series · 14 of 25 positions shown · non-contrast
Comparison: None.

CLINICAL DATA: Worsening chronic kidney disease.

EXAM:
RENAL / URINARY TRACT ULTRASOUND COMPLETE

[Series 1: us renal · 0.22mm/px · 14 of 37 slices shown]
[im 1/37]
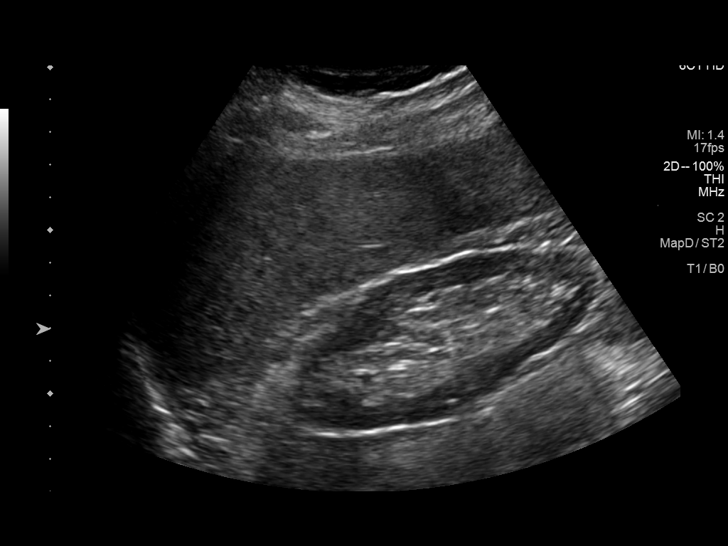
[im 4/37]
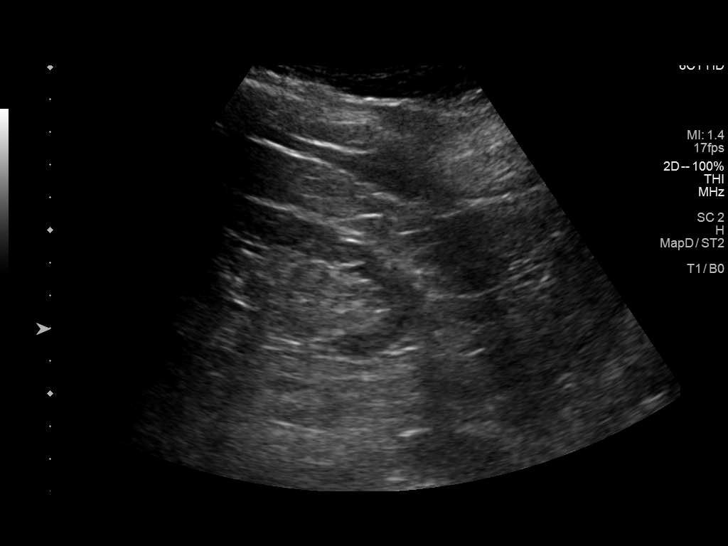
[im 7/37]
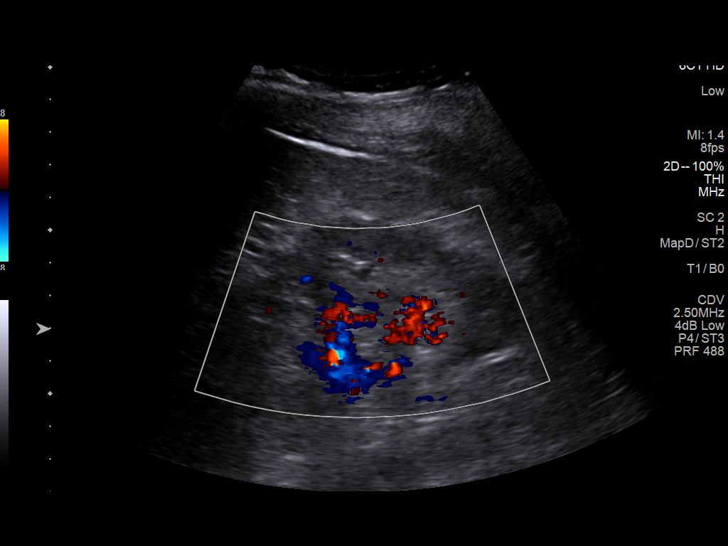
[im 10/37]
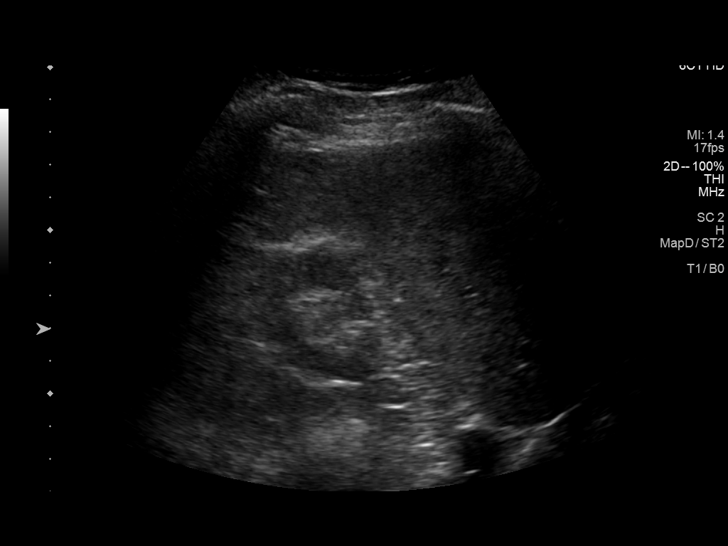
[im 13/37]
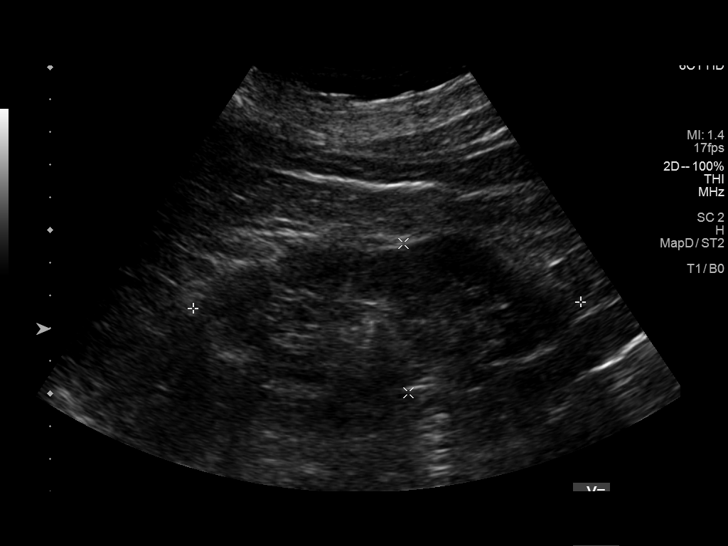
[im 14/37]
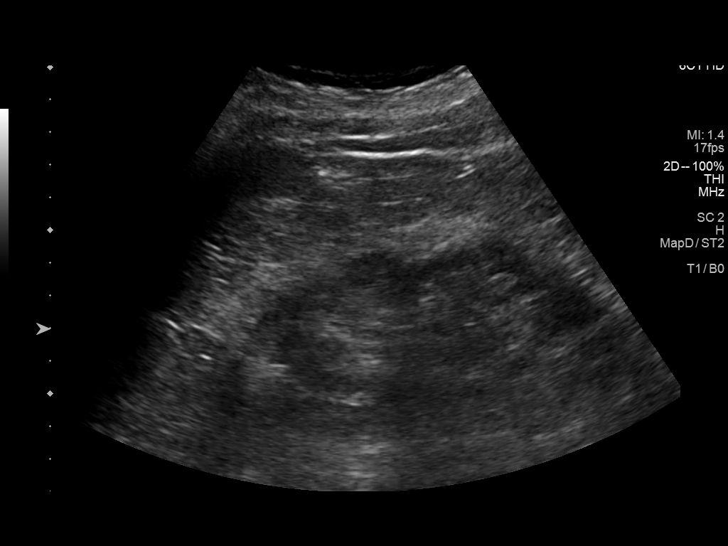
[im 17/37]
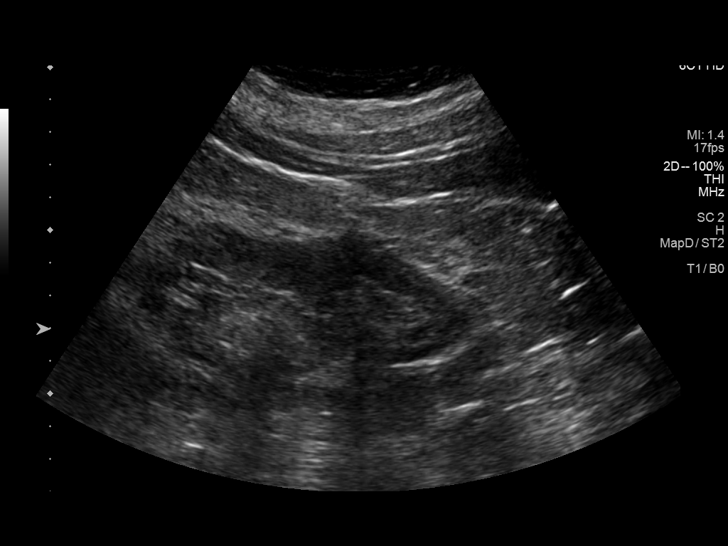
[im 20/37]
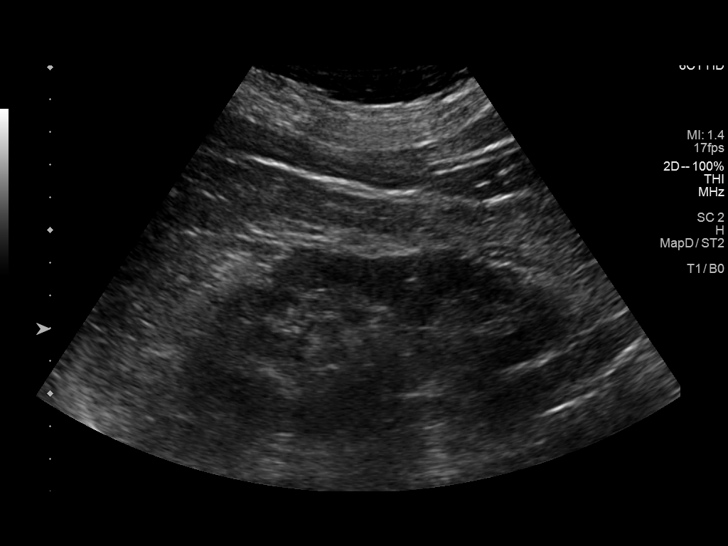
[im 23/37]
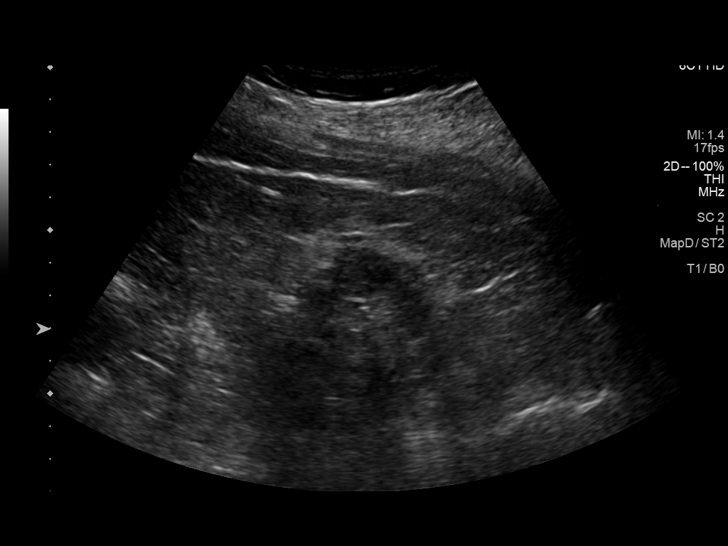
[im 25/37]
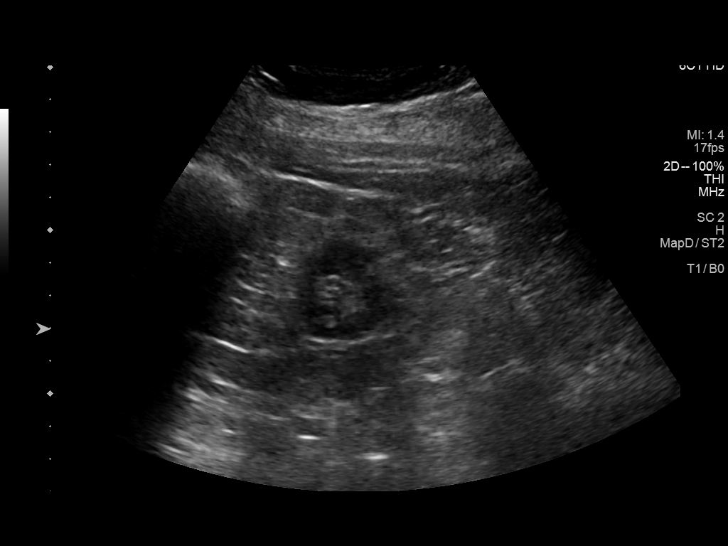
[im 28/37]
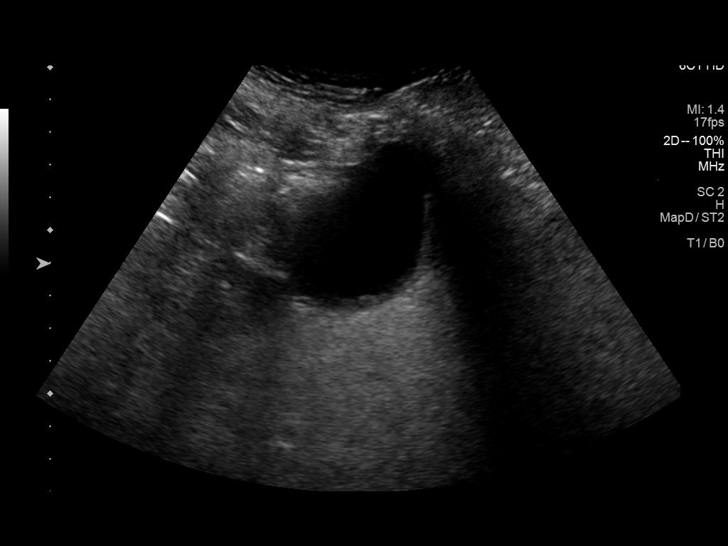
[im 31/37]
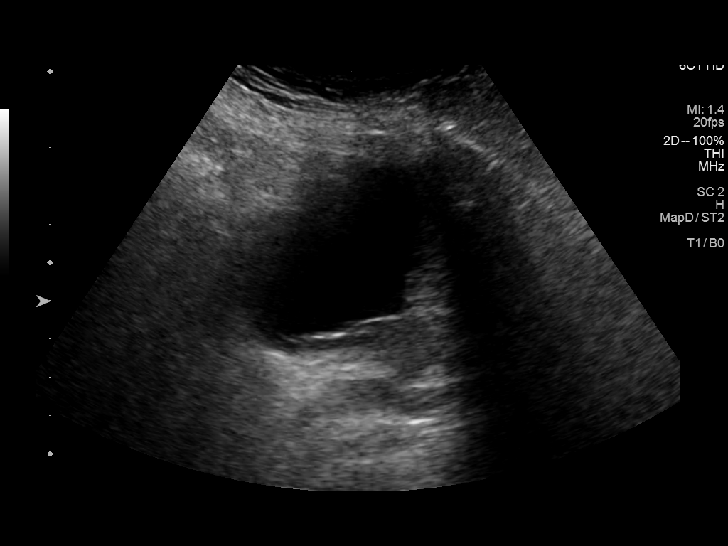
[im 34/37]
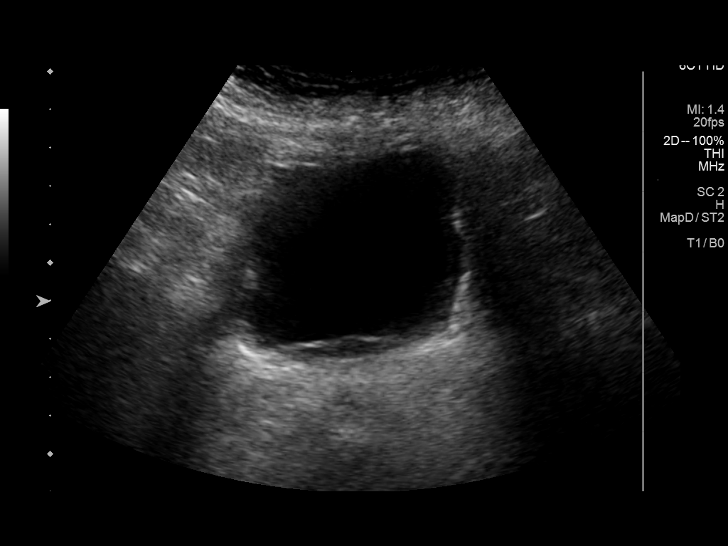
[im 37/37]
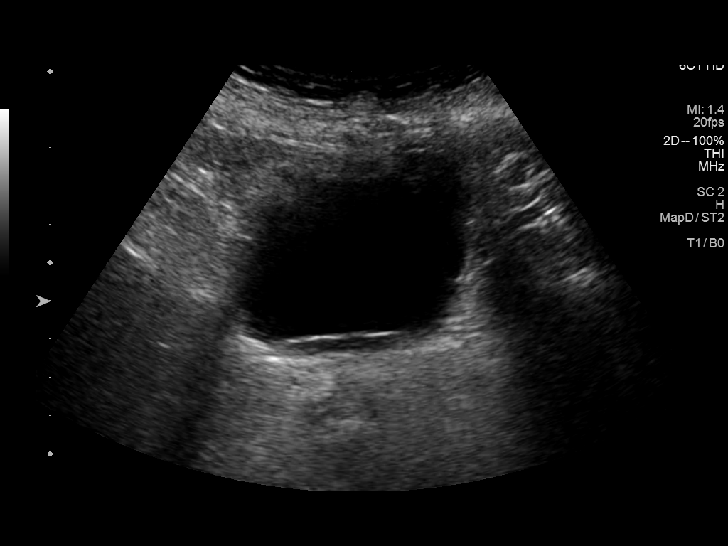

[14 of 25 positions shown; findings below may reference images not displayed]

FINDINGS: Right Kidney:

Renal measurements: 10.8 x 4.5 x 5.3 cm = volume: 135 mL. Mild
thinning of the renal cortex but normal echogenicity and no focal
lesions. No hydronephrosis. No definite renal calculi.

Left Kidney:

Renal measurements: 11.9 x 4.6 x 4.5 cm = volume: 128 mL. Mild renal
cortical thinning and probable remote scarring changes. Normal
echogenicity and no worrisome lesions. No hydronephrosis or definite
renal calculi.

Bladder:

Small amount of layering dependent debris in the bladder is noted.
No bladder calculi or mass. Bilateral ureteral jets are noted.
IMPRESSION: 1. Mild renal cortical thinning bilaterally but normal echogenicity
and no focal lesions or hydronephrosis.
2. Small amount of layering debris in the bladder but no bladder
mass or calculi.

## 2019-09-16 ENCOUNTER — Ambulatory Visit: Payer: Medicare HMO | Admitting: Internal Medicine

## 2019-09-23 DIAGNOSIS — J9611 Chronic respiratory failure with hypoxia: Secondary | ICD-10-CM | POA: Diagnosis not present

## 2019-09-23 DIAGNOSIS — J449 Chronic obstructive pulmonary disease, unspecified: Secondary | ICD-10-CM | POA: Diagnosis not present

## 2019-09-30 ENCOUNTER — Ambulatory Visit: Payer: Medicare HMO | Admitting: Internal Medicine

## 2019-09-30 ENCOUNTER — Other Ambulatory Visit: Payer: Self-pay

## 2019-09-30 ENCOUNTER — Encounter: Payer: Self-pay | Admitting: Internal Medicine

## 2019-09-30 VITALS — BP 122/68 | HR 81 | Temp 96.4°F | Ht 67.0 in | Wt 176.2 lb

## 2019-09-30 DIAGNOSIS — J449 Chronic obstructive pulmonary disease, unspecified: Secondary | ICD-10-CM | POA: Diagnosis not present

## 2019-09-30 DIAGNOSIS — J9611 Chronic respiratory failure with hypoxia: Secondary | ICD-10-CM | POA: Diagnosis not present

## 2019-09-30 MED ORDER — PREDNISONE 10 MG PO TABS: ORAL_TABLET | ORAL | 0 refills | Status: DC

## 2019-09-30 MED ORDER — FLUTICASONE-SALMETEROL 250-50 MCG/DOSE IN AEPB: INHALATION_SPRAY | RESPIRATORY_TRACT | 11 refills | Status: DC

## 2019-09-30 NOTE — Progress Notes (Signed)
Subjective:     Patient ID: Justin Walters, male   DOB: [birth year],     MRN: [id number]    Brief patient profile:  51 yowm MM quit smoking 1995 with cough/wheeze/ sob and never recovered with documented GOLD III copd 02/23/2017      History of Present Illness  01/25/2017 1st  office visit/ Justin Walters   GOLD III with reversibility  Chief Complaint  Patient presents with  . Pulmonary Consult    Referred by the hospital. He c/o SOB and coughing for the past several wks. His cough is occ prod, but he is unsure of sputum color. He is SOB with exertion and also when he lies down.  He gets winded walking approx 100 yards. He is using his albuterol inhaler 3 x per wk on average.   baseline doe across the street to son's house  nl pace and then onset   x one year gradually worse doe and no better on inhalers but poor baseline hfa/ assoc with cough / sense of chest congestion and has to prop up at 30 degrees now at hs to get comfortable Present doe = MMRC3 = can't walk 100 yards even at a slow pace at a flat grade s stopping due to sob  Easily confused with details of care/ names of meds / not using spiriva/ was better on pred and worse since tapered rec Plan A = Automatic =  symbicort 160 Take 2 puffs first thing in am and then another 2 puffs about 12 hours later.  Work on inhaler technique:  Plan B = Backup Only use your albuterol  as a rescue  Prednisone 10 mg take  4 each am x 2 days,   2 each am x 2 days,  1 each am x 2 days and stop     02/23/2017  f/u ov/Justin Walters re:  GOLD III copd / no 02 Chief Complaint  Patient presents with  . Follow-up    PFT's today.  Breathing has improved some. He is rarely using his albuterol inhaler. He has been feeling fatigued.    better p prednisone then gradually worse  Dyspnea:  MMRC2 = can't walk a nl pace on a flat grade s sob but does fine slow and flat  Cough: none Sleep: ok 30 degrees no am congestion rec Add tudorza one puff after symbicort in am and in pm   Work on inhaler technique:      05/24/2017  f/u ov/Justin Walters re:  Copd III/ no 02  Just on maint symb 160 / could not afford tudorza and not sure it helped doe  Chief Complaint  Patient presents with  . Follow-up    Breathing is doing well. He rarely uses his albuterol inhaler. No new co's.   Dyspnea:  MMRC2 = can't walk a nl pace on a flat grade s sob but does fine slow pace pretty much anywhere he wants to go  Cough: none Sleep: ok flat SABA use:  Rare  Hb every hs x sev months / resolves p about an hour, better p sit up / no assoc dysphagia or daytime symptoms/ varies some with how early he eats supper and how much, not sure what type meals make it worse  rec GERD diet  If not better add pepcid ac 20 mg one hour before bed > did not  Do   10/31/17 NP eval for 3-4 weeks worse sob/ cough rec Xopenex in office  Depo 80 Prednisone 10mg  tablet  >>>  4 tabs for 2 days, then 3 tabs for 2 days, 2 tabs for 2 days, then 1 tab for 2 days, then sto Azithromycin 250mg  tablet  >>>Take 2 tablets (500mg  total) today, and then 1 tablet (250mg ) for the next four days      11/21/2017 acute extended ov/Justin Walters re: refractory cough x early oct 2019  / no change sob  Chief Complaint  Patient presents with  . Acute Visit    pt c/o sinus congestion, pnd, prod cough with white mucus X1 month.  has had 2 rounds of pred and doxy with little relief.   dyspnea : MMRC2 = can't walk a nl pace on a flat grade s sob but does fine slow and flat= baseline when not coughing  Cough > white mucus  not worse in am's/ harsh daytime coughing fts Sleeping flat two pillow saba  Not at all now rec Plan A = Automatic = symbicort 160 Take 2 puffs first thing in am and then another 2 puffs about 12 hours later.  Whenever your cough flares > try  Try prilosec otc 20mg   Take 30-60 min before first meal of the day and Pepcid ac (famotidine) 20 mg one after hour after supper or at bedtime until cough is completely gone for at  least a week without the need for cough suppression Plan B = Backup Only use your albuterol inhaler as a rescue medication  goes up over your usual need - Don't leave home without it !!  (think of it like the spare tire for your car)  For cough Mucinex dm 1200 mg every 12 hours and the tylenol #3  Up to every 4 hours if need for persistent irritating coughing  GERD diet      12/05/2017 acute extended ov/Justin Walters re: cough and sob worse since oc 2019 Chief Complaint  Patient presents with  . Acute Visit    Increased SOB, cough and chest congestion x 1 month. Sputum is white to yellow. He is using his albuterol inhaler 2 x per wk on average.   cough is worse p supper and fine on back with  flat bed/ no blocks  In am feels fine but groggy p benadryl - can't  sleep s it W/in 20 min of rising lots of pnds > white mucus  Not using mucinex dm / reversing ppi and h2  Technique on symb poor and not using saba correctly in terms or when to use  rec Augmentin 875 mg take one pill twice daily  X 10 days - take at breakfast and supper with large glass of water.  .  Take prednisone  4 for three days 3 for three days 2 for three days 1 for three days and stop  Omeprazole 20 mg Take 30- 60 min before your first and last meals of the day and continue famotidine 20 mg at bedtime Bed blocks would be a great idea Try off zquil and tylenol #3  Up to 2 at bedtime For cough > mucinex dm 1200 mg every 12 hours and use the flutter valve as much as possible and if still can't stop coughing then use the tylenol #3        12/20/2017  f/u ov/Justin Walters re:  Cough better, doe not quite baseline on symb 160 2bid  No worse off pred Chief Complaint  Patient presents with  . Follow-up    Breathing has improved some but is not back to his normal baseline. He rarely uses  his albuterol inhaler.   Dyspnea:  MMRC2 = can't walk a nl pace on a flat grade s sob but does fine slow and flat  Cough: better/ no mucus production at all   Sleeping: ok flat  SABA use: rarely 02: none   rec Bed blocks 6-8 inches  For cough > Mucinex dm up to 1200 mg twice daily and use the flutter valve as much as you can Gabapentin should be three times daily  Omeprazole is Take 30-60 min before first meal of the day and pepcid ac 20 mg after supper     01/25/2018 acute extended ov/Justin Walters re:  Copd III / stopped symb too expensive, does better with nebs / overall not doing well since early Oct 2019  Chief Complaint  Patient presents with  . Acute Visit    SOB, runny nose, ear pain, wheezing, cough- white mucus at times, denies fever   brought meds/ not using ppi as instructed and ? If really using flutter, doesn't think it helps but did not bring it as rec to show him how to use/ using halls cough drops with menthol which was discouraged last ov/ gradually worse doe/ subj wheeze x sev weeks no better with hfa but technique poor/ no purulent mucus/ cough worse at hs and in am. No better on prednisone  rec Duoneb (albuterol and atrovent)  4 x daily Only use your albuterol as a rescue medication   Work on inhaler technique:  Be sure you take prilosec Take 30- 60 min before your first and last meals of the day  Change your appt to  - add sinus ct / hrct chest if still coughing on return      02/22/2018  f/u ov/Justin Walters re: COPD  GOLD III maint on duoneb tid  Chief Complaint  Patient presents with  . Follow-up    Increased SOB, wheezing, and cough- non over the past 3-4 wks. He is using his albuterol inhaler 3-4 x per day and neb 3 x per day.   Dyspnea:  Still does [employer] with hc parking no 02  Cough: p supper is the worst but never prod Sleeping: bed blocks/ wakes up tight in chest around 2 am p last neb at 7 pm  SABA use: as above  rec Plan A = Automatic = Symbicort 160 Take 2 puffs first thing in am and then another 2 puffs about 12 hours later and duoneb four times   Work on inhaler technique:   Plan B = Backup Only use your  albuterol inhaler as a rescue medication Prednisone 10 mg  Take 4 for three days 3 for three days 2 for three days 1 for three days and stop  Please see patient coordinator before you leave today  to schedule 02 2lpm 24/7 for now Please remember to go to the  x-ray department  for your tests - we will call you with the results when they are available   Please schedule a follow up office visit in 2 weeks, sooner if needed  with all medications /inhalers/ solutions in hand so we can verify exactly what you are taking. This includes all medications from all doctors and over the counters and bring your DRUG FORMULARY  - consider change coreg to bisoprolol or at least bystolic samples short run  - add levaquin 500 mg x 10 d for ? pna    03/08/2018  f/u ov/Justin Walters re:  COPD  III  On symb and duoneb qid  Chief Complaint  Patient presents with  . Follow-up    Breathing has improved back to baseline. He is not using his albuterol inhaler but is using his duoneb 4 x daily.   Dyspnea:  Better, usually does [employer] leaning on cart s 02  Cough: none now Sleeping: bed blocks  SABA use: none extra in hfa form  02: 2lpm at hs not with activity  rec Plan A = Automatic = Incruse/BREO  one click eam am and continue 02 2 lpm at bedtime and wear it  as needed during the day to keep your sats > 90% with walking or go slower Plan B = Backup Only use your albuterol inhaler as a rescue medication  Plan C = Crisis - only use your albuterol nebulizer(not your ipatropium)  if you first try Plan B and it fails to help > ok to use the nebulizer up to every 4 hours but if start needing it regularly call for immediate appointment Please remember to go to the  x-ray department  for your tests - we will call you with the results when they are available   Please schedule a follow up office visit in 2 weeks with your formulary , sooner if needed  with all medications /inhalers/ solutions in hand so we can verify exactly what  you are taking. This includes all medications from all doctors and over the counters (ok to see NP if I don't have opening )    03/25/2018  f/u ov/Justin Walters re:  COPD III overall worse since Oct 2019, esp since Feb 02 2018 Chief Complaint  Patient presents with  . Follow-up    Breathing has been progressively worse since the last visit. He is using his rescue inhaler daily and neb 4 x daily.   Dyspnea:  Across the room on 3lpm  Cough: turned green x sev days prior to OV / sometimes coughs so hard hurts in chest (ant/ generalized)  but not using flutter as rec   Sleeping: bed blocks does fine most nocts SABA use: as above  02:  3lpm 24/7  rec Change BREO / Incruse to Anoro click x one and take two good drags (hold with the R hand) Prednisone 10 mg take  4 each am x 2 days,   2 each am x 2 days,  1 each am x 2 days and stop  Augmentin 875 mg take one pill twice daily  X 10 days -  Plan B = Backup Only use your albuterol inhaler as a rescue medication Plan C = Crisis - only use your albuterol nebulizer(NOT your ipatropium/albuterol)  if you first try Plan B Add protonix 40 mg Take 30-60 min before first meal of the day  The goal with 02 is to keep your 02 saturations over 90% at all times        06/11/2019  f/u ov/Justin Walters re: copd III  2nd shot early may 2021 / maint trelegy with pred as plan D  Chief Complaint  Patient presents with  . Follow-up  Dyspnea:  Still walking to son's house 300 flat s stopping on RA Cough: worse since  6/7 non productive but rattling > some  better since started  prednisone Sleeping: 6 in blocks SABA use: none > did not try rechallenging 02: not using / not checking  rec Make sure you check your oxygen saturations at highest level of activity   For cough /congestion/rattling >  mucinex 1200 mg every 12 hours as  needed with flutter as much as possible     09/30/2019  f/u ov/Justin Walters re:  COPD  III / trelegy plus prednisone 10 mg daily has trouble affording  meds Chief Complaint  Patient presents with  . Follow-up    Patient states that he has been doing good since last visit until yesterday. He states he was just feeling more tired and having to take more breaks when doing things.   Dyspnea:  Some decrease ex tol eg yard off trelegy due to cost Cough: none  Sleeping: 6 in blocks s resp symptoms  SABA use: none  02: none  Symptoms flare when on 5 mg prednisone so remains on 10 /day   No obvious day to day or daytime variability or assoc excess/ purulent sputum or mucus plugs or hemoptysis or cp or chest tightness, subjective wheeze or overt sinus or hb symptoms.   Sleeping as above  without nocturnal  or early am exacerbation  of respiratory  c/o's or need for noct saba. Also denies any obvious fluctuation of symptoms with weather or environmental changes or other aggravating or alleviating factors except as outlined above   No unusual exposure hx or h/o childhood pna/ asthma or knowledge of premature birth.  Current Allergies, Complete Past Medical History, Past Surgical History, Family History, and Social History were reviewed in [employer] record.  ROS  The following are not active complaints unless bolded Hoarseness, sore throat, dysphagia, dental problems, itching, sneezing,  nasal congestion or discharge of excess mucus or purulent secretions, ear ache,   fever, chills, sweats, unintended wt loss or wt gain, classically pleuritic or exertional cp,  orthopnea pnd or arm/hand swelling  or leg swelling, presyncope, palpitations, abdominal pain, anorexia, nausea, vomiting, diarrhea  or change in bowel habits or change in bladder habits, change in stools or change in urine, dysuria, hematuria,  rash, arthralgias, visual complaints, headache, numbness, weakness or ataxia or problems with walking or coordination,  change in mood or  memory.        Current Meds  Medication Sig  . acetaminophen (TYLENOL) 325 MG tablet Take  650 mg by mouth every 6 (six) hours as needed for mild pain or headache.  . albuterol (PROVENTIL HFA;VENTOLIN HFA) 108 (90 Base) MCG/ACT inhaler INHALE 2 PUFFS INTO THE LUNGS EVERY 6 (SIX) HOURS AS NEEDED FOR WHEEZING OR SHORTNESS OF BREATH.  Marland Kitchen albuterol (PROVENTIL) (2.5 MG/3ML) 0.083% nebulizer solution Take 3 mLs (2.5 mg total) by nebulization every 4 (four) hours as needed for wheezing or shortness of breath.  Marland Kitchen aspirin 81 MG EC tablet Take 1 tablet (81 mg total) by mouth daily. Swallow whole.  . calcium-vitamin D (OSCAL WITH D) 500-200 MG-UNIT tablet Take 1 tablet by mouth. 50 mcg once a day  . cholecalciferol (VITAMIN D3) 25 MCG (1000 UNIT) tablet Take 2,000 Units by mouth daily.  . diclofenac Sodium (VOLTAREN) 1 % GEL   . diphenhydrAMINE (BENADRYL) 25 MG tablet Take 50 mg by mouth every 6 (six) hours as needed.   . doxylamine, Sleep, (UNISOM) 25 MG tablet Take 50 mg by mouth at bedtime as needed for sleep.  . famotidine (PEPCID) 20 MG tablet Take 20 mg by mouth at bedtime.  . Fluticasone-Umeclidin-Vilant (TRELEGY ELLIPTA) 100-62.5-25 MCG/INH AEPB Inhale 1 puff into the lungs daily.  Marland Kitchen gabapentin (NEURONTIN) 300 MG capsule Take one capsule by mouth three times daily  . Guaifenesin (MUCINEX MAXIMUM STRENGTH) 1200 MG TB12 Take 1 tablet by mouth 2 (two)  times daily.  . pantoprazole (PROTONIX) 40 MG tablet TAKE 1 TABLET BY MOUTH ONCE DAILY 30-60  MINUTES  BEFORE  FIRST  MEAL  OF  THE  DAY  . predniSONE (DELTASONE) 10 MG tablet 2 tabs daily until better, then 1 tab daily until return to clinic  . Respiratory Therapy Supplies (FLUTTER) DEVI Use as directed  . rosuvastatin (CRESTOR) 20 MG tablet TAKE 1 TABLET EVERY DAY  . Saccharomyces boulardii (PROBIOTIC) 250 MG CAPS Take 1 capsule by mouth daily.  . tamsulosin (FLOMAX) 0.4 MG CAPS capsule Take 0.4 mg by mouth daily.  . vitamin B-12 (CYANOCOBALAMIN) 1000 MCG tablet Take 1 tablet (1,000 mcg total) by mouth daily.                   Objective:   Physical Exam  amb wm nad      09/30/2019        176  06/11/2019          180  03/10/2019          178 09/10/2018          174  06/06/2018          171  03/25/2018       155  03/08/2018         149   01/25/2018       156  12/05/2017       157  11/21/2017     157  05/24/2017        164    01/25/17 150 lb 12.8 oz (68.4 kg)  01/18/17 150 lb (68 kg)  01/08/17 150 lb (68 kg)      Vital signs reviewed  09/30/2019  - Note at rest 02 sats  97% on RA       Reports Upper partial plate       HEENT : pt wearing mask not removed for exam due to covid -19 concerns.    NECK :  without JVD/Nodes/TM/ nl carotid upstrokes bilaterally   LUNGS: no acc muscle use,  Mod barrel  contour chest wall with bilateral  Distant bs s audible wheeze and  without cough on insp or exp maneuvers and mod  Hyperresonant  to  percussion bilaterally     CV:  RRR  no s3 or murmur or increase in P2, and no edema   ABD:  soft and nontender with pos mid insp Hoover's  in the supine position. No bruits or organomegaly appreciated, bowel sounds nl  MS:     ext warm without deformities, calf tenderness, cyanosis or clubbing No obvious joint restrictions   SKIN: warm and dry without lesions    NEURO:  alert, approp, nl sensorium with  no motor or cerebellar deficits apparent.             Assessment:

## 2019-09-30 NOTE — Patient Instructions (Addendum)
Ceiling for prednisone should be no more than 20 mg per day and try a floor of 10 mg alternating with 5 mg   Ok to stop oxygen and nebulizer   Ok to try wixella 250 one twice daily in place of Trelegy  Please schedule a follow up visit in 3 months but call sooner if needed

## 2019-10-01 ENCOUNTER — Encounter: Payer: Self-pay | Admitting: Internal Medicine

## 2019-10-01 NOTE — Assessment & Plan Note (Signed)
Quit smoking 1995 PFT's  01/20/2017  FEV1 1.05 (39 % ) ratio 54  p 18 % improvement from saba p ? prior to study with DLCO  50/53  % corrects to 77 % for alv volume  - 01/25/2017   rec increase symb to 160 2bid trial  - alpha one AT screening  01/18/17   MM  Level 190 PFT's  02/23/2017  FEV1 1.17 (44 % ) ratio 54  p 0 % improvement from saba p symb 160 prior to study with DLCO   39 % corrects to 59 % for alv volume  - 02/23/2017    add tudorza trial > no change and could not afford - 12/05/2017 flutter valve added   - 02/22/2018    > restart symb 160 2bid - 03/08/2018  After extensive coaching inhaler device,  effectiveness =    90% with elipta so try Anoro daily > improved as of 06/06/2018  - 12/05/2018 reported on televisit much worse everytime stops pred while on anoro so rec change to trelegy and pred 20 ceiling/ 5 mg floor until seen  - 12/20/2018 improved but did not start trelegy and taking anoro at hs > rec trelegy q am and stop anoro, reduce pred to floor of 5mg  qod > titrated complete off as of 02/2019 and gradually worse by ov 03/10/2019  - 03/10/2019 rec Prednisone as Plan D = 20 mg until better then taper off if tol   - 09/30/2019 reports lowest dose of prednisone 10 mg o/w achy/cough/nause  (since 03/2019)  - 09/30/2019  After extensive coaching inhaler device,  effectiveness =    90%  -  09/30/2019   Walked RA  2 laps @ approx 241ft each @ slow pace  stopped due to end of study, tured > sob and sats still 96% - 09/30/2019 rec wixella 250 to save over trelegy until out of donut hole      Group D in terms of symptom/risk and laba/lama/ICS  therefore appropriate rx at this point >>>  Can't afford trelegy for now so wixella best bet.   The goal with a chronic steroid dependent illness is always arriving at the lowest effective dose that controls the disease/symptoms and not accepting a set "formula" which is based on statistics or guidelines that don't always take into account patient  variability or the  natural hx of the dz in every individual patient, which may well vary over time.  For now therefore I recommend the patient maintain  Ceiling of 20 mg and floor of 10 mg a/w 5 mg daily   No long needs 02 and wants to stop paying for neb as well - based on how well he did with mdi teaching and ex today both are reasonable.

## 2019-10-01 NOTE — Assessment & Plan Note (Addendum)
Qualified for 02 2019 01/25/2017 Patient Saturations on Room Air at Rest = 88% Patient Saturations on [employer] while Ambulating = 86% Patient Saturations on 2 Liters of oxygen while Ambulating = 91% - 02/22/2018 RA sats 88% at rest > resolved on 2lpm rec 24/7 02  - 03/08/2018   Walked RA  2 laps @  approx 260ft each @ slow pace  stopped due to  End of study / sats 88% p one lap and 84 p second so rec 2lpm with acitivities outside the house /titrate to keep > 90%   - as of 05/06/2018 using 2lpm 24/7 s desats walking   - as of 06/06/2018 no desats on RA walking  - as of 06/06/2018 d/c'd 02  - 03/10/2019   Walked RA x one lap =  approx 250 ft -@ moderate pace stopped due to fatigue > sob with sats still 91% so rec 02 prn sats < 90%   - 09/30/2019 no desats walking > d/c'd 02 at pt request          Each maintenance medication was reviewed in detail including emphasizing most importantly the difference between maintenance and prns and under what circumstances the prns are to be triggered using an action plan format where appropriate.  Total time for H and P, chart review, counseling, teaching device  directly observing portions of ambulatory 02 saturation study/  and generating customized AVS unique to this office visit / charting = 20 min

## 2019-10-14 ENCOUNTER — Other Ambulatory Visit: Payer: Self-pay | Admitting: Internal Medicine

## 2019-10-14 ENCOUNTER — Other Ambulatory Visit: Payer: Self-pay | Admitting: Family

## 2019-10-23 DIAGNOSIS — J9611 Chronic respiratory failure with hypoxia: Secondary | ICD-10-CM | POA: Diagnosis not present

## 2019-10-23 DIAGNOSIS — J449 Chronic obstructive pulmonary disease, unspecified: Secondary | ICD-10-CM | POA: Diagnosis not present

## 2019-11-12 DIAGNOSIS — N200 Calculus of kidney: Secondary | ICD-10-CM | POA: Diagnosis not present

## 2019-11-12 DIAGNOSIS — N400 Enlarged prostate without lower urinary tract symptoms: Secondary | ICD-10-CM | POA: Diagnosis not present

## 2019-11-12 DIAGNOSIS — I129 Hypertensive chronic kidney disease with stage 1 through stage 4 chronic kidney disease, or unspecified chronic kidney disease: Secondary | ICD-10-CM | POA: Diagnosis not present

## 2019-11-12 DIAGNOSIS — J449 Chronic obstructive pulmonary disease, unspecified: Secondary | ICD-10-CM | POA: Diagnosis not present

## 2019-11-12 DIAGNOSIS — N1832 Chronic kidney disease, stage 3b: Secondary | ICD-10-CM | POA: Diagnosis not present

## 2019-11-17 DIAGNOSIS — N390 Urinary tract infection, site not specified: Secondary | ICD-10-CM | POA: Diagnosis not present

## 2019-11-23 DIAGNOSIS — J449 Chronic obstructive pulmonary disease, unspecified: Secondary | ICD-10-CM | POA: Diagnosis not present

## 2019-11-23 DIAGNOSIS — J9611 Chronic respiratory failure with hypoxia: Secondary | ICD-10-CM | POA: Diagnosis not present

## 2019-12-18 ENCOUNTER — Other Ambulatory Visit: Payer: Self-pay | Admitting: Internal Medicine

## 2019-12-18 DIAGNOSIS — R918 Other nonspecific abnormal finding of lung field: Secondary | ICD-10-CM

## 2019-12-23 DIAGNOSIS — J449 Chronic obstructive pulmonary disease, unspecified: Secondary | ICD-10-CM | POA: Diagnosis not present

## 2019-12-23 DIAGNOSIS — J9611 Chronic respiratory failure with hypoxia: Secondary | ICD-10-CM | POA: Diagnosis not present

## 2019-12-31 ENCOUNTER — Other Ambulatory Visit: Payer: Self-pay

## 2019-12-31 ENCOUNTER — Other Ambulatory Visit (INDEPENDENT_AMBULATORY_CARE_PROVIDER_SITE_OTHER): Payer: Medicare HMO

## 2019-12-31 DIAGNOSIS — Z1159 Encounter for screening for other viral diseases: Secondary | ICD-10-CM | POA: Diagnosis not present

## 2019-12-31 DIAGNOSIS — E538 Deficiency of other specified B group vitamins: Secondary | ICD-10-CM | POA: Diagnosis not present

## 2019-12-31 DIAGNOSIS — N183 Chronic kidney disease, stage 3 unspecified: Secondary | ICD-10-CM | POA: Diagnosis not present

## 2019-12-31 DIAGNOSIS — N390 Urinary tract infection, site not specified: Secondary | ICD-10-CM | POA: Diagnosis not present

## 2019-12-31 DIAGNOSIS — E559 Vitamin D deficiency, unspecified: Secondary | ICD-10-CM | POA: Diagnosis not present

## 2019-12-31 DIAGNOSIS — R7302 Impaired glucose tolerance (oral): Secondary | ICD-10-CM | POA: Diagnosis not present

## 2019-12-31 LAB — HEPATIC FUNCTION PANEL
ALT: 8 U/L (ref 0–53)
AST: 10 U/L (ref 0–37)
Albumin: 4.1 g/dL (ref 3.5–5.2)
Alkaline Phosphatase: 33 U/L — ABNORMAL LOW (ref 39–117)
Bilirubin, Direct: 0.1 mg/dL (ref 0.0–0.3)
Total Bilirubin: 0.4 mg/dL (ref 0.2–1.2)
Total Protein: 6.6 g/dL (ref 6.0–8.3)

## 2019-12-31 LAB — LIPID PANEL
Cholesterol: 126 mg/dL (ref 0–200)
HDL: 56.3 mg/dL (ref >39.00)
LDL Cholesterol: 53 mg/dL (ref 0–99)
NonHDL: 69.93
Total CHOL/HDL Ratio: 2
Triglycerides: 87 mg/dL (ref 0.0–149.0)
VLDL: 17.4 mg/dL (ref 0.0–40.0)

## 2019-12-31 LAB — BASIC METABOLIC PANEL
BUN: 25 mg/dL — ABNORMAL HIGH (ref 6–23)
CO2: 28 mEq/L (ref 19–32)
Calcium: 8.9 mg/dL (ref 8.4–10.5)
Chloride: 106 mEq/L (ref 96–112)
Creatinine, Ser: 1.47 mg/dL (ref 0.40–1.50)
GFR: 45.28 mL/min — ABNORMAL LOW (ref >60.00)
Glucose, Bld: 90 mg/dL (ref 70–99)
Potassium: 3.9 mEq/L (ref 3.5–5.1)
Sodium: 144 mEq/L (ref 135–145)

## 2019-12-31 LAB — URINALYSIS, ROUTINE W REFLEX MICROSCOPIC
Bilirubin Urine: NEGATIVE
Hgb urine dipstick: NEGATIVE
Ketones, ur: NEGATIVE
Leukocytes,Ua: NEGATIVE
Nitrite: NEGATIVE
RBC / HPF: NONE SEEN (ref 0–?)
Specific Gravity, Urine: 1.025 (ref 1.000–1.030)
Urine Glucose: NEGATIVE
Urobilinogen, UA: 0.2 (ref 0.0–1.0)
pH: 5.5 (ref 5.0–8.0)

## 2019-12-31 LAB — HEMOGLOBIN A1C: Hgb A1c MFr Bld: 6.2 % (ref 4.6–6.5)

## 2019-12-31 LAB — VITAMIN B12: Vitamin B-12: >1526 pg/mL — ABNORMAL HIGH (ref 211–911)

## 2019-12-31 LAB — VITAMIN D 25 HYDROXY (VIT D DEFICIENCY, FRACTURES): VITD: 42.42 ng/mL (ref 30.00–100.00)

## 2020-01-01 LAB — URINE CULTURE

## 2020-01-01 LAB — HEPATITIS C ANTIBODY
Hepatitis C Ab: NONREACTIVE
SIGNAL TO CUT-OFF: 0.01 (ref <1.00)

## 2020-01-05 ENCOUNTER — Other Ambulatory Visit: Payer: Self-pay

## 2020-01-05 ENCOUNTER — Ambulatory Visit: Payer: Medicare HMO | Admitting: Internal Medicine

## 2020-01-05 ENCOUNTER — Encounter: Payer: Self-pay | Admitting: Internal Medicine

## 2020-01-05 DIAGNOSIS — J449 Chronic obstructive pulmonary disease, unspecified: Secondary | ICD-10-CM | POA: Diagnosis not present

## 2020-01-05 MED ORDER — PREDNISONE 10 MG PO TABS: ORAL_TABLET | ORAL | 2 refills | Status: DC

## 2020-01-05 MED ORDER — TRELEGY ELLIPTA 100-62.5-25 MCG/INH IN AEPB: 1.0000 | INHALATION_SPRAY | Freq: Every day | RESPIRATORY_TRACT | 11 refills | Status: DC

## 2020-01-05 MED ORDER — TRELEGY ELLIPTA 100-62.5-25 MCG/INH IN AEPB: 1.0000 | INHALATION_SPRAY | Freq: Every day | RESPIRATORY_TRACT | 0 refills | Status: DC

## 2020-01-05 NOTE — Progress Notes (Signed)
Subjective:     Patient ID: Justin Walters, male   DOB: [birth year],     MRN: [id number]    Brief patient profile:  51 yowm MM quit smoking 1995 with cough/wheeze/ sob and never recovered with documented GOLD III copd 02/23/2017      History of Present Illness  01/25/2017 1st  office visit/ [name]   GOLD III with reversibility  Chief Complaint  Patient presents with  . Pulmonary Consult    Referred by the hospital. He c/o SOB and coughing for the past several wks. His cough is occ prod, but he is unsure of sputum color. He is SOB with exertion and also when he lies down.  He gets winded walking approx 100 yards. He is using his albuterol inhaler 3 x per wk on average.   baseline doe across the street to son's house  nl pace and then onset   x one year gradually worse doe and no better on inhalers but poor baseline hfa/ assoc with cough / sense of chest congestion and has to prop up at 30 degrees now at hs to get comfortable Present doe = MMRC3 = can't walk 100 yards even at a slow pace at a flat grade s stopping due to sob  Easily confused with details of care/ names of meds / not using spiriva/ was better on pred and worse since tapered rec Plan A = Automatic =  symbicort 160 Take 2 puffs first thing in am and then another 2 puffs about 12 hours later.  Work on inhaler technique:  Plan B = Backup Only use your albuterol  as a rescue  Prednisone 10 mg take  4 each am x 2 days,   2 each am x 2 days,  1 each am x 2 days and stop     02/23/2017  f/u ov/[name] re:  GOLD III copd / no 02 Chief Complaint  Patient presents with  . Follow-up    PFT's today.  Breathing has improved some. He is rarely using his albuterol inhaler. He has been feeling fatigued.    better p prednisone then gradually worse  Dyspnea:  MMRC2 = can't walk a nl pace on a flat grade s sob but does fine slow and flat  Cough: none Sleep: ok 30 degrees no am congestion rec Add tudorza one puff after symbicort in am and in pm   Work on inhaler technique:      05/24/2017  f/u ov/[name] re:  Copd III/ no 02  Just on maint symb 160 / could not afford tudorza and not sure it helped doe  Chief Complaint  Patient presents with  . Follow-up    Breathing is doing well. He rarely uses his albuterol inhaler. No new co's.   Dyspnea:  MMRC2 = can't walk a nl pace on a flat grade s sob but does fine slow pace pretty much anywhere he wants to go  Cough: none Sleep: ok flat SABA use:  Rare  Hb every hs x sev months / resolves p about an hour, better p sit up / no assoc dysphagia or daytime symptoms/ varies some with how early he eats supper and how much, not sure what type meals make it worse  rec GERD diet  If not better add pepcid ac 20 mg one hour before bed > did not  Do   10/31/17 NP eval for 3-4 weeks worse sob/ cough rec Xopenex in office  Depo 80 Prednisone 10mg  tablet  >>>  4 tabs for 2 days, then 3 tabs for 2 days, 2 tabs for 2 days, then 1 tab for 2 days, then sto Azithromycin 250mg  tablet  >>>Take 2 tablets (500mg  total) today, and then 1 tablet (250mg ) for the next four days      11/21/2017 acute extended ov/[name] re: refractory cough x early oct 2019  / no change sob  Chief Complaint  Patient presents with  . Acute Visit    pt c/o sinus congestion, pnd, prod cough with white mucus X1 month.  has had 2 rounds of pred and doxy with little relief.   dyspnea : MMRC2 = can't walk a nl pace on a flat grade s sob but does fine slow and flat= baseline when not coughing  Cough > white mucus  not worse in am's/ harsh daytime coughing fts Sleeping flat two pillow saba  Not at all now rec Plan A = Automatic = symbicort 160 Take 2 puffs first thing in am and then another 2 puffs about 12 hours later.  Whenever your cough flares > try  Try prilosec otc 20mg   Take 30-60 min before first meal of the day and Pepcid ac (famotidine) 20 mg one after hour after supper or at bedtime until cough is completely gone for at  least a week without the need for cough suppression Plan B = Backup Only use your albuterol inhaler as a rescue medication  goes up over your usual need - Don't leave home without it !!  (think of it like the spare tire for your car)  For cough Mucinex dm 1200 mg every 12 hours and the tylenol #3  Up to every 4 hours if need for persistent irritating coughing  GERD diet      12/05/2017 acute extended ov/[name] re: cough and sob worse since oc 2019 Chief Complaint  Patient presents with  . Acute Visit    Increased SOB, cough and chest congestion x 1 month. Sputum is white to yellow. He is using his albuterol inhaler 2 x per wk on average.   cough is worse p supper and fine on back with  flat bed/ no blocks  In am feels fine but groggy p benadryl - can't  sleep s it W/in 20 min of rising lots of pnds > white mucus  Not using mucinex dm / reversing ppi and h2  Technique on symb poor and not using saba correctly in terms or when to use  rec Augmentin 875 mg take one pill twice daily  X 10 days - take at breakfast and supper with large glass of water.  .  Take prednisone  4 for three days 3 for three days 2 for three days 1 for three days and stop  Omeprazole 20 mg Take 30- 60 min before your first and last meals of the day and continue famotidine 20 mg at bedtime Bed blocks would be a great idea Try off zquil and tylenol #3  Up to 2 at bedtime For cough > mucinex dm 1200 mg every 12 hours and use the flutter valve as much as possible and if still can't stop coughing then use the tylenol #3        12/20/2017  f/u ov/[name] re:  Cough better, doe not quite baseline on symb 160 2bid  No worse off pred Chief Complaint  Patient presents with  . Follow-up    Breathing has improved some but is not back to his normal baseline. He rarely uses  his albuterol inhaler.   Dyspnea:  MMRC2 = can't walk a nl pace on a flat grade s sob but does fine slow and flat  Cough: better/ no mucus production at all   Sleeping: ok flat  SABA use: rarely 02: none   rec Bed blocks 6-8 inches  For cough > Mucinex dm up to 1200 mg twice daily and use the flutter valve as much as you can Gabapentin should be three times daily  Omeprazole is Take 30-60 min before first meal of the day and pepcid ac 20 mg after supper     01/25/2018 acute extended ov/Javanni Maring re:  Copd III / stopped symb too expensive, does better with nebs / overall not doing well since early Oct 2019  Chief Complaint  Patient presents with  . Acute Visit    SOB, runny nose, ear pain, wheezing, cough- white mucus at times, denies fever   brought meds/ not using ppi as instructed and ? If really using flutter, doesn't think it helps but did not bring it as rec to show him how to use/ using halls cough drops with menthol which was discouraged last ov/ gradually worse doe/ subj wheeze x sev weeks no better with hfa but technique poor/ no purulent mucus/ cough worse at hs and in am. No better on prednisone  rec Duoneb (albuterol and atrovent)  4 x daily Only use your albuterol as a rescue medication   Work on inhaler technique:  Be sure you take prilosec Take 30- 60 min before your first and last meals of the day  Change your appt to  - add sinus ct / hrct chest if still coughing on return      02/22/2018  f/u ov/Sherolyn Trettin re: COPD  GOLD III maint on duoneb tid  Chief Complaint  Patient presents with  . Follow-up    Increased SOB, wheezing, and cough- non over the past 3-4 wks. He is using his albuterol inhaler 3-4 x per day and neb 3 x per day.   Dyspnea:  Still does [employer] with hc parking no 02  Cough: p supper is the worst but never prod Sleeping: bed blocks/ wakes up tight in chest around 2 am p last neb at 7 pm  SABA use: as above  rec Plan A = Automatic = Symbicort 160 Take 2 puffs first thing in am and then another 2 puffs about 12 hours later and duoneb four times   Work on inhaler technique:   Plan B = Backup Only use your  albuterol inhaler as a rescue medication Prednisone 10 mg  Take 4 for three days 3 for three days 2 for three days 1 for three days and stop  Please see patient coordinator before you leave today  to schedule 02 2lpm 24/7 for now Please remember to go to the  x-ray department  for your tests - we will call you with the results when they are available   Please schedule a follow up office visit in 2 weeks, sooner if needed  with all medications /inhalers/ solutions in hand so we can verify exactly what you are taking. This includes all medications from all doctors and over the counters and bring your DRUG FORMULARY  - consider change coreg to bisoprolol or at least bystolic samples short run  - add levaquin 500 mg x 10 d for ? pna    03/08/2018  f/u ov/Krysteena Stalker re:  COPD  III  On symb and duoneb qid  Chief Complaint  Patient presents with  . Follow-up    Breathing has improved back to baseline. He is not using his albuterol inhaler but is using his duoneb 4 x daily.   Dyspnea:  Better, usually does [employer] leaning on cart s 02  Cough: none now Sleeping: bed blocks  SABA use: none extra in hfa form  02: 2lpm at hs not with activity  rec Plan A = Automatic = Incruse/BREO  one click eam am and continue 02 2 lpm at bedtime and wear it  as needed during the day to keep your sats > 90% with walking or go slower Plan B = Backup Only use your albuterol inhaler as a rescue medication  Plan C = Crisis - only use your albuterol nebulizer(not your ipatropium)  if you first try Plan B and it fails to help > ok to use the nebulizer up to every 4 hours but if start needing it regularly call for immediate appointment Please remember to go to the  x-ray department  for your tests - we will call you with the results when they are available   Please schedule a follow up office visit in 2 weeks with your formulary , sooner if needed  with all medications /inhalers/ solutions in hand so we can verify exactly what  you are taking. This includes all medications from all doctors and over the counters (ok to see NP if I don't have opening )    03/25/2018  f/u ov/Brave Dack re:  COPD III overall worse since Oct 2019, esp since Feb 02 2018 Chief Complaint  Patient presents with  . Follow-up    Breathing has been progressively worse since the last visit. He is using his rescue inhaler daily and neb 4 x daily.   Dyspnea:  Across the room on 3lpm  Cough: turned green x sev days prior to OV / sometimes coughs so hard hurts in chest (ant/ generalized)  but not using flutter as rec   Sleeping: bed blocks does fine most nocts SABA use: as above  02:  3lpm 24/7  rec Change BREO / Incruse to Anoro click x one and take two good drags (hold with the R hand) Prednisone 10 mg take  4 each am x 2 days,   2 each am x 2 days,  1 each am x 2 days and stop  Augmentin 875 mg take one pill twice daily  X 10 days -  Plan B = Backup Only use your albuterol inhaler as a rescue medication Plan C = Crisis - only use your albuterol nebulizer(NOT your ipatropium/albuterol)  if you first try Plan B Add protonix 40 mg Take 30-60 min before first meal of the day  The goal with 02 is to keep your 02 saturations over 90% at all times        06/11/2019  f/u ov/Hallelujah Wysong re: copd III  2nd shot early may 2021 / maint trelegy with pred as plan D  Chief Complaint  Patient presents with  . Follow-up  Dyspnea:  Still walking to son's house 300 ft  flat s stopping on RA Cough: worse since  6/7 non productive but rattling > some  better since started  prednisone Sleeping: 6 in blocks SABA use: none > did not try rechallenging 02: not using / not checking  rec Make sure you check your oxygen saturations at highest level of activity   For cough /congestion/rattling >  mucinex 1200 mg every 12  hours as needed with flutter as much as possible     09/30/2019  f/u ov/Craig Ionescu re:  COPD  III / trelegy plus prednisone 10 mg daily has trouble affording  meds Chief Complaint  Patient presents with  . Follow-up    Patient states that he has been doing good since last visit until yesterday. He states he was just feeling more tired and having to take more breaks when doing things.   Dyspnea:  Some decrease ex tol eg yard off trelegy due to cost Cough: none  Sleeping: 6 in blocks s resp symptoms  SABA use: none  02: none  Symptoms flare when on 5 mg prednisone so remains on 10 /day rec Ceiling for prednisone should be no more than 20 mg per day and try a floor of 10 mg alternating with 5 mg  Ok to stop oxygen and nebulizer  Ok to try wixella 250 one twice daily in place of Trelegy Please schedule a follow up visit in 3 months but call sooner if needed     01/05/2020  f/u ov/Nickolette Espinola re: GOLD 3 on advair and prednisone 10 mg always worse on 5 mg with cough/ congestion Chief Complaint  Patient presents with  . Follow-up    COPD  Dyspnea:  still walking to son's house = MMRC2 = can't walk a nl pace on a flat grade s sob but does fine slow  Not as fast walk as on trelegy  Cough: sensation of pnds min white / clears throat a lot daytime on max gerd rx and gabapenitn 300 tid  Sleeping: 6 in blocks/ 2 pillows  SABA use: none / not using neb  02: none- sent it back   No obvious day to day or daytime variability or assoc  purulent sputum or mucus plugs or hemoptysis or cp or chest tightness, subjective wheeze or overt sinus or hb symptoms.   Sleeping without nocturnal  or early am exacerbation  of respiratory  c/o's or need for noct saba. Also denies any obvious fluctuation of symptoms with weather or environmental changes or other aggravating or alleviating factors except as outlined above   No unusual exposure hx or h/o childhood pna/ asthma or knowledge of premature birth.  Current Allergies, Complete Past Medical History, Past Surgical History, Family History, and Social History were reviewed in [employer] record.  ROS   The following are not active complaints unless bolded Hoarseness, sore throat, dysphagia, dental problems, itching, sneezing,  nasal congestion or discharge of excess mucus or purulent secretions, ear ache,   fever, chills, sweats, unintended wt loss or wt gain, classically pleuritic or exertional cp,  orthopnea pnd or arm/hand swelling  or leg swelling, presyncope, palpitations, abdominal pain, anorexia, nausea, vomiting, diarrhea  or change in bowel habits or change in bladder habits, change in stools or change in urine, dysuria, hematuria,  rash, arthralgias, visual complaints, headache, numbness, weakness or ataxia or problems with walking or coordination,  change in mood or  memory.        Current Meds  Medication Sig  . acetaminophen (TYLENOL) 325 MG tablet Take 650 mg by mouth every 6 (six) hours as needed for mild pain or headache.  . albuterol (PROVENTIL HFA;VENTOLIN HFA) 108 (90 Base) MCG/ACT inhaler INHALE 2 PUFFS INTO THE LUNGS EVERY 6 (SIX) HOURS AS NEEDED FOR WHEEZING OR SHORTNESS OF BREATH.  Marland Kitchen albuterol (PROVENTIL) (2.5 MG/3ML) 0.083% nebulizer solution Take 3 mLs (2.5 mg total) by nebulization every 4 (  four) hours as needed for wheezing or shortness of breath.  Marland Kitchen aspirin 81 MG EC tablet Take 1 tablet (81 mg total) by mouth daily. Swallow whole.  . calcium-vitamin D (OSCAL WITH D) 500-200 MG-UNIT tablet Take 1 tablet by mouth. 50 mcg once a day  . cholecalciferol (VITAMIN D3) 25 MCG (1000 UNIT) tablet Take 2,000 Units by mouth daily.  . diclofenac Sodium (VOLTAREN) 1 % GEL   . diphenhydrAMINE (BENADRYL) 25 MG tablet Take 50 mg by mouth every 6 (six) hours as needed.   . doxylamine, Sleep, (UNISOM) 25 MG tablet Take 50 mg by mouth at bedtime as needed for sleep.  . famotidine (PEPCID) 20 MG tablet Take 20 mg by mouth at bedtime.  . Fluticasone-Salmeterol (WIXELA INHUB) 250-50 MCG/DOSE AEPB One twice daily  . Fluticasone-Umeclidin-Vilant (TRELEGY ELLIPTA) 100-62.5-25 MCG/INH AEPB  Inhale 1 puff into the lungs daily.  Marland Kitchen gabapentin (NEURONTIN) 300 MG capsule TAKE 1 CAPSULE THREE TIMES DAILY  . Guaifenesin 1200 MG TB12 Take 1 tablet by mouth 2 (two) times daily.  . pantoprazole (PROTONIX) 40 MG tablet TAKE 1 TABLET BY MOUTH ONCE DAILY 30-60  MINUTES  BEFORE  FIRST  MEAL  OF  THE  DAY  . predniSONE (DELTASONE) 10 MG tablet 2 tabs daily until better, then 1 tab daily until return to clinic  . Respiratory Therapy Supplies (FLUTTER) DEVI Use as directed  . rosuvastatin (CRESTOR) 20 MG tablet TAKE 1 TABLET EVERY DAY  . Saccharomyces boulardii (PROBIOTIC) 250 MG CAPS Take 1 capsule by mouth daily.  . tamsulosin (FLOMAX) 0.4 MG CAPS capsule Take 0.4 mg by mouth daily.  . vitamin B-12 (CYANOCOBALAMIN) 1000 MCG tablet Take 1 tablet (1,000 mcg total) by mouth daily.                Objective:   Physical Exam     01/05/2020         179 09/30/2019        176  06/11/2019          180  03/10/2019          178 09/10/2018          174  06/06/2018          171  03/25/2018       155  03/08/2018         149   01/25/2018       156  12/05/2017       157  11/21/2017     157  05/24/2017        164    01/25/17 150 lb 12.8 oz (68.4 kg)  01/18/17 150 lb (68 kg)  01/08/17 150 lb (68 kg)     Vital signs reviewed  01/05/2020  - Note at rest 02 sats  96% on RA   General appearance:    Pleasant elderly wm, occ vigorous dry sounding throat clearing    Reports Upper partial plate    HEENT : pt wearing mask not removed for exam due to covid -19 concerns.    NECK :  without JVD/Nodes/TM/ nl carotid upstrokes bilaterally   LUNGS: no acc muscle use,  Mod barrel  contour chest wall with bilateral  Distant bs s audible wheeze and  without cough on insp or exp maneuvers and mod  Hyperresonant  to  percussion bilaterally     CV:  RRR  no s3 or murmur or increase in P2, and no edema   ABD:  soft  and nontender with pos mid insp Hoover's  in the supine position. No bruits or organomegaly appreciated,  bowel sounds nl  MS:     ext warm without deformities, calf tenderness, cyanosis or clubbing No obvious joint restrictions   SKIN: warm and dry without lesions    NEURO:  alert, approp, nl sensorium with  no motor or cerebellar deficits apparent.                  Assessment:

## 2020-01-05 NOTE — Assessment & Plan Note (Addendum)
Quit smoking 1995/MM PFT's  01/20/2017  FEV1 1.05 (39 % ) ratio 54  p 18 % improvement from saba p ? prior to study with DLCO  50/53  % corrects to 77 % for alv volume  - 01/25/2017   rec increase symb to 160 2bid trial  - alpha one AT screening  01/18/17   MM  Level 190 PFT's  02/23/2017  FEV1 1.17 (44 % ) ratio 54  p 0 % improvement from saba p symb 160 prior to study with DLCO   39 % corrects to 59 % for alv volume  - 02/23/2017    add tudorza trial > no change and could not afford - 12/05/2017 flutter valve added   - 02/22/2018    > restart symb 160 2bid - 03/08/2018  After extensive coaching inhaler device,  effectiveness =    90% with elipta so try Anoro daily > improved as of 06/06/2018  - 12/05/2018 reported on televisit much worse everytime stops pred while on anoro so rec change to trelegy and pred 20 ceiling/ 5 mg floor until seen  - 12/20/2018 improved but did not start trelegy and taking anoro at hs > rec trelegy q am and stop anoro, reduce pred to floor of 5mg  qod > titrated complete off as of 02/2019 and gradually worse by ov 03/10/2019  - 03/10/2019 rec Prednisone as Plan D = 20 mg until better then taper off if tol   - 09/30/2019 reports lowest dose of prednisone 10 mg o/w achy/cough/nause  (since 03/2019)  - 09/30/2019  After extensive coaching inhaler device,  effectiveness =    90%  -  09/30/2019   Walked RA  2 laps @ approx 223ft each @ slow pace  stopped due to end of study, tured > sob and sats still 96% - 09/30/2019 rec wixella 250 to save over trelegy until out of donut hole    - 01/05/2020 back on [email or web address] if can afford on plan as worse doe on wixella      Group D in terms of symptom/risk and laba/lama/ICS  therefore appropriate rx at this point >>>  Resume trelegy [id number] one click each am   Advised:  formulary restrictions will be an ongoing challenge for the forseable future and I would be happy to pick an alternative if the pt will first  provide me a list of them -  pt  will need to return  here for training for any new device that is required eg dpi vs hfa vs respimat.    In the meantime we can always provide samples so that the patient never runs out of any needed respiratory medications.   Re pred: The goal with a chronic steroid dependent illness is always arriving at the lowest effective dose that controls the disease/symptoms and not accepting a set "formula" which is based on statistics or guidelines that don't always take into account patient  variability or the natural hx of the dz in every individual patient, which may well vary over time.  For now therefore I recommend the patient maintain  20 mg ceiling and try 10/5 alternate days for floor it tolerated           Each maintenance medication was reviewed in detail including emphasizing most importantly the difference between maintenance and prns and under what circumstances the prns are to be triggered using an action plan format where appropriate.  Total time for H and P, chart review, counseling, reviewing options for  inhaler device and generating customized AVS unique to this office visit / charting = 21 min

## 2020-01-05 NOTE — Patient Instructions (Addendum)
Plan A = Automatic = Always=    trelegy one click am  Then rinse and gargle   Arm and hammer toothpaste may be helpful   If doing better try prednisone 10 mg alternating with 5 mg on odd/even dates  Plan B = Backup (to supplement plan A, not to replace it) Only use your albuterol inhaler as a rescue medication to be used if you can't catch your breath by resting or doing a relaxed purse lip breathing pattern.  - The less you use it, the better it will work when you need it. - Ok to use the inhaler up to 2 puffs  every 4 hours if you must but call for appointment if use goes up over your usual need - Don't leave home without it !!  (think of it like the spare tire for your car)    Make sure you check your oxygen saturation  at your highest level of activity  to be sure it stays over 90% and adjust  02 flow upward to maintain this level if needed but remember to turn it back to previous settings when you stop (to conserve your supply).    Please schedule a follow up visit in 6  months but call sooner if needed

## 2020-01-06 ENCOUNTER — Other Ambulatory Visit: Payer: Self-pay

## 2020-01-06 ENCOUNTER — Ambulatory Visit (INDEPENDENT_AMBULATORY_CARE_PROVIDER_SITE_OTHER): Payer: Medicare HMO | Admitting: Internal Medicine

## 2020-01-06 ENCOUNTER — Encounter: Payer: Self-pay | Admitting: Internal Medicine

## 2020-01-06 VITALS — BP 110/60 | HR 85 | Temp 98.0°F | Ht 67.0 in | Wt 180.0 lb

## 2020-01-06 DIAGNOSIS — Z0000 Encounter for general adult medical examination without abnormal findings: Secondary | ICD-10-CM | POA: Diagnosis not present

## 2020-01-06 DIAGNOSIS — N390 Urinary tract infection, site not specified: Secondary | ICD-10-CM

## 2020-01-06 DIAGNOSIS — I10 Essential (primary) hypertension: Secondary | ICD-10-CM

## 2020-01-06 DIAGNOSIS — J449 Chronic obstructive pulmonary disease, unspecified: Secondary | ICD-10-CM

## 2020-01-06 DIAGNOSIS — E7849 Other hyperlipidemia: Secondary | ICD-10-CM | POA: Diagnosis not present

## 2020-01-06 DIAGNOSIS — E559 Vitamin D deficiency, unspecified: Secondary | ICD-10-CM | POA: Diagnosis not present

## 2020-01-06 DIAGNOSIS — N1831 Chronic kidney disease, stage 3a: Secondary | ICD-10-CM | POA: Diagnosis not present

## 2020-01-06 DIAGNOSIS — E538 Deficiency of other specified B group vitamins: Secondary | ICD-10-CM

## 2020-01-06 DIAGNOSIS — R7302 Impaired glucose tolerance (oral): Secondary | ICD-10-CM

## 2020-01-06 NOTE — Assessment & Plan Note (Signed)
Has known urethral stricturing and bph, last uti June 2020 - stable overall by history and exam, recent data reviewed with pt, and pt to continue medical treatment as before,  to f/u any worsening symptoms or concerns

## 2020-01-06 NOTE — Patient Instructions (Signed)
Please continue all other medications as before, and refills have been done if requested. ° °Please have the pharmacy call with any other refills you may need. ° °Please continue your efforts at being more active, low cholesterol diet, and weight control. ° °You are otherwise up to date with prevention measures today. ° °Please keep your appointments with your specialists as you may have planned ° °Please make an Appointment to return in 6 months, or sooner if needed °

## 2020-01-06 NOTE — Progress Notes (Signed)
Established Patient Office Visit  Subjective:  Patient ID: Justin Walters, male    DOB: June 21, 1940  Age: 80 y.o. MRN: [id number]       Chief Complaint: : wellness and No chief complaint on file.       HPI:  Justin Walters is a 80 y.o. male here for wellness overall doing ok; Has not specific complaints.  Has jsut seen renal x 5 days ago with labs, and declines further labs today.    Pt denies Chest pain, worsening SOB, DOE, wheezing, orthopnea, PND, worsening LE edema, palpitations, dizziness or syncope.  Pt denies neurological change such as new headache, facial or extremity weakness.  Pt denies polydipsia, polyuria. Pt states overall good compliance with treatment and medications,   Pt denies worsening depressive symptoms, suicidal ideation or panic  Trying to remain as active as possible      Wt Readings from Last 3 Encounters:  01/06/20 180 lb (81.6 kg)  01/05/20 179 lb 6.4 oz (81.4 kg)  09/30/19 176 lb 3.2 oz (79.9 kg)   BP Readings from Last 3 Encounters:  01/06/20 110/60  01/05/20 124/76  09/30/19 122/68      Past Medical History:  Diagnosis Date  . At risk for sleep apnea    STOP-BANG= 5    SENT TO PCP 06-13-2013  . Bladder stone   . BPH (benign prostatic hyperplasia)   . COPD (chronic obstructive pulmonary disease) ([place])   . Diverticulosis of colon   . Dysuria   . Glucose intolerance (impaired glucose tolerance)   . History of kidney stones   . Hyperlipidemia   . Hypertension   . Ureteral disorder 2018   ureteral repair   Past Surgical History:  Procedure Laterality Date  . APPENDECTOMY  1980's  . CATARACT EXTRACTION W/ INTRAOCULAR LENS  IMPLANT, BILATERAL    . COLONOSCOPY  10-05-2003   tics only   . CYSTOSCOPY N/A 06/23/2013   Procedure: CYSTOSCOPY WITH LITHOLAPAXY, BLADDER EXPLORATION, REPAIR BLADDER PERFORATION;  Surgeon: Arvil Persons, MD;  Location: [facility];  Service: Urology;  Laterality: N/A;  . CYSTOSCOPY/URETEROSCOPY/HOLMIUM LASER/STENT  PLACEMENT Left 04/03/2017   Procedure: CYSTOSCOPY LEFT RETROGRADE/LEFT /URETEROSCOPY/HOLMIUM LASER/BASKET STONE EXTRACTION/STENT PLACEMENT/ URETHRAL BIOPSY;  Surgeon: [name], MD;  Location: [facility];  Service: Urology;  Laterality: Left;  . EXTRACORPOREAL SHOCK WAVE LITHOTRIPSY Left 10/23/2016   Procedure: LEFT EXTRACORPOREAL SHOCK WAVE LITHOTRIPSY (ESWL);  Surgeon: Nickie Retort, MD;  Location: [facility];  Service: Urology;  Laterality: Left;  . EXTRACORPOREAL SHOCK WAVE LITHOTRIPSY Left 01/08/2017   Procedure: LEFT EXTRACORPOREAL SHOCK WAVE LITHOTRIPSY (ESWL);  Surgeon: [name], MD;  Location: [facility];  Service: Urology;  Laterality: Left;  . HOLMIUM LASER APPLICATION N/A [id number]   Procedure: HOLMIUM LASER APPLICATION;  Surgeon: Arvil Persons, MD;  Location: [facility];  Service: Urology;  Laterality: N/A;  . LAPAROSCOPIC INGUINAL HERNIA REPAIR Bilateral 10-09-2003  . TRANSURETHRAL RESECTION OF PROSTATE N/A 06/23/2013   Procedure: TRANSURETHRAL RESECTION OF THE PROSTATE;  Surgeon: Arvil Persons, MD;  Location: [facility];  Service: Urology;  Laterality: N/A;    reports that he quit smoking about 27 years ago. His smoking use included cigarettes. He has a 102.00 pack-year smoking history. He has never used smokeless tobacco. He reports that he does not drink alcohol and does not use drugs. family history includes COPD in his mother; Cancer in his father; Emphysema in his brother; Heart failure in his brother. No  Known Allergies Current Outpatient Medications on File Prior to Visit  Medication Sig Dispense Refill  . acetaminophen (TYLENOL) 325 MG tablet Take 650 mg by mouth every 6 (six) hours as needed for mild pain or headache.    . albuterol (PROVENTIL HFA;VENTOLIN HFA) 108 (90 Base) MCG/ACT inhaler INHALE 2 PUFFS INTO THE LUNGS EVERY 6 (SIX) HOURS AS NEEDED FOR WHEEZING OR SHORTNESS OF BREATH. 54 g 3  . albuterol (PROVENTIL) (2.5 MG/3ML) 0.083%  nebulizer solution Take 3 mLs (2.5 mg total) by nebulization every 4 (four) hours as needed for wheezing or shortness of breath. 120 mL 11  . aspirin 81 MG EC tablet Take 1 tablet (81 mg total) by mouth daily. Swallow whole. 30 tablet 12  . calcium-vitamin D (OSCAL WITH D) 500-200 MG-UNIT tablet Take 1 tablet by mouth. 50 mcg once a day    . cholecalciferol (VITAMIN D3) 25 MCG (1000 UNIT) tablet Take 2,000 Units by mouth daily.    . diclofenac Sodium (VOLTAREN) 1 % GEL     . diphenhydrAMINE (BENADRYL) 25 MG tablet Take 50 mg by mouth every 6 (six) hours as needed.     . doxylamine, Sleep, (UNISOM) 25 MG tablet Take 50 mg by mouth at bedtime as needed for sleep.    . famotidine (PEPCID) 20 MG tablet Take 20 mg by mouth at bedtime.    . Fluticasone-Umeclidin-Vilant (TRELEGY ELLIPTA) 100-62.5-25 MCG/INH AEPB Inhale 1 puff into the lungs daily. 1 each 11  . Fluticasone-Umeclidin-Vilant (TRELEGY ELLIPTA) 100-62.5-25 MCG/INH AEPB Inhale 1 puff into the lungs daily. 28 each 0  . gabapentin (NEURONTIN) 300 MG capsule TAKE 1 CAPSULE THREE TIMES DAILY 270 capsule 2  . Guaifenesin 1200 MG TB12 Take 1 tablet by mouth 2 (two) times daily.    . pantoprazole (PROTONIX) 40 MG tablet TAKE 1 TABLET BY MOUTH ONCE DAILY 30-60  MINUTES  BEFORE  FIRST  MEAL  OF  THE  DAY 90 tablet 1  . predniSONE (DELTASONE) 10 MG tablet 2 tabs daily until better, then 1 tab daily until return to clinic 100 tablet 2  . Respiratory Therapy Supplies (FLUTTER) DEVI Use as directed 1 each 0  . rosuvastatin (CRESTOR) 20 MG tablet TAKE 1 TABLET EVERY DAY 90 tablet 2  . Saccharomyces boulardii (PROBIOTIC) 250 MG CAPS Take 1 capsule by mouth daily.    . tamsulosin (FLOMAX) 0.4 MG CAPS capsule Take 0.4 mg by mouth daily.    . vitamin B-12 (CYANOCOBALAMIN) 1000 MCG tablet Take 1 tablet (1,000 mcg total) by mouth daily. 90 tablet 1   No current facility-administered medications on file prior to visit.        ROS:  All others reviewed and  negative.  Objective        PE:  BP 110/60 (BP Location: Left Arm, Patient Position: Sitting, Cuff Size: Large)   Pulse 85   Temp 98 F (36.7 C) (Oral)   Ht 5\' 7"  (1.702 m)   Wt 180 lb (81.6 kg)   SpO2 94%   BMI 28.19 kg/m                 Constitutional: Pt appears in NAD               HENT: Head: NCAT.                Right Ear: External ear normal.                 Left Ear: External  ear normal.                Eyes: . Pupils are equal, round, and reactive to light. Conjunctivae and EOM are normal               Nose: without d/c or deformity               Neck: Neck supple. Gross normal ROM               Cardiovascular: Normal rate and regular rhythm.                 Pulmonary/Chest: Effort normal and breath sounds without rales or wheezing.                Abd:  Soft, NT, ND, + BS, no organomegaly               Neurological: Pt is alert. At baseline orientation, motor grossly intact               Skin: Skin is warm. No rashes, no other new lesions, LE edema - none               Psychiatric: Pt behavior is normal without agitation   Assessment/Plan:  Justin Walters is a 80 y.o. White or Caucasian [1] male with  has a past medical history of At risk for sleep apnea, Bladder stone, BPH (benign prostatic hyperplasia), COPD (chronic obstructive pulmonary disease) ([facility]), Diverticulosis of colon, Dysuria, Glucose intolerance (impaired glucose tolerance), History of kidney stones, Hyperlipidemia, Hypertension, and Ureteral disorder (2018).  Assessment Plan  See notes Labs reviewed for each problem: Lab Results  Component Value Date   WBC 12.0 (H) 06/26/2019   HGB 13.0 06/26/2019   HCT 40.0 06/26/2019   PLT 253.0 06/26/2019   GLUCOSE 90 12/31/2019   CHOL 126 12/31/2019   TRIG 87.0 12/31/2019   HDL 56.30 12/31/2019   LDLDIRECT 161.9 03/28/2012   LDLCALC 53 12/31/2019   ALT 8 12/31/2019   AST 10 12/31/2019   NA 144 12/31/2019   K 3.9 12/31/2019   CL 106 12/31/2019   CREATININE  1.47 12/31/2019   BUN 25 (H) 12/31/2019   CO2 28 12/31/2019   TSH 2.68 06/26/2019   PSA 2.59 06/26/2019   HGBA1C 6.2 12/31/2019    Micro: none  Cardiac tracings I have personally interpreted today:  none  Pertinent Radiological findings (summarize): none    There are no preventive care reminders to display for this patient.  Lab Results  Component Value Date   TSH 2.68 06/26/2019   Lab Results  Component Value Date   WBC 12.0 (H) 06/26/2019   HGB 13.0 06/26/2019   HCT 40.0 06/26/2019   MCV 86.3 06/26/2019   PLT 253.0 06/26/2019   Lab Results  Component Value Date   NA 144 12/31/2019   K 3.9 12/31/2019   CO2 28 12/31/2019   GLUCOSE 90 12/31/2019   BUN 25 (H) 12/31/2019   CREATININE 1.47 12/31/2019   BILITOT 0.4 12/31/2019   ALKPHOS 33 (L) 12/31/2019   AST 10 12/31/2019   ALT 8 12/31/2019   PROT 6.6 12/31/2019   ALBUMIN 4.1 12/31/2019   CALCIUM 8.9 12/31/2019   ANIONGAP 8 03/27/2018   GFR 45.28 (L) 12/31/2019   Lab Results  Component Value Date   CHOL 126 12/31/2019   Lab Results  Component Value Date   HDL 56.30 12/31/2019   Lab Results  Component Value Date  LDLCALC 53 12/31/2019   Lab Results  Component Value Date   TRIG 87.0 12/31/2019   Lab Results  Component Value Date   CHOLHDL 2 12/31/2019   Lab Results  Component Value Date   HGBA1C 6.2 12/31/2019      Assessment & Plan:   Problem List Items Addressed This Visit      High   Preventative health care - Primary    Overall doing well, age appropriate education and counseling updated, referrals for preventative services and immunizations addressed, dietary and smoking counseling addressed, most recent labs reviewed.  I have personally reviewed and have noted:  1) the patient's medical and social history 2) The pt's use of alcohol, tobacco, and illicit drugs 3) The patient's current medications and supplements 4) Functional ability including ADL's, fall risk, home safety risk,  hearing and visual impairment 5) Diet and physical activities 6) Evidence for depression or mood disorder 7) The patient's height, weight, and BMI have been recorded in the chart  I have made referrals, and provided counseling and education based on review of the above Declines lab f/u today      COPD GOLD III    Stable, cont current tx - albuterol hfa prn  Current Outpatient Medications (Endocrine & Metabolic):  .  predniSONE (DELTASONE) 10 MG tablet, 2 tabs daily until better, then 1 tab daily until return to clinic  Current Outpatient Medications (Cardiovascular):  .  rosuvastatin (CRESTOR) 20 MG tablet, TAKE 1 TABLET EVERY DAY  Current Outpatient Medications (Respiratory):  .  albuterol (PROVENTIL HFA;VENTOLIN HFA) 108 (90 Base) MCG/ACT inhaler, INHALE 2 PUFFS INTO THE LUNGS EVERY 6 (SIX) HOURS AS NEEDED FOR WHEEZING OR SHORTNESS OF BREATH. Marland Kitchen.  albuterol (PROVENTIL) (2.5 MG/3ML) 0.083% nebulizer solution, Take 3 mLs (2.5 mg total) by nebulization every 4 (four) hours as needed for wheezing or shortness of breath. .  diphenhydrAMINE (BENADRYL) 25 MG tablet, Take 50 mg by mouth every 6 (six) hours as needed.  .  Fluticasone-Umeclidin-Vilant (TRELEGY ELLIPTA) 100-62.5-25 MCG/INH AEPB, Inhale 1 puff into the lungs daily. .  Fluticasone-Umeclidin-Vilant (TRELEGY ELLIPTA) 100-62.5-25 MCG/INH AEPB, Inhale 1 puff into the lungs daily. .  Guaifenesin 1200 MG TB12, Take 1 tablet by mouth 2 (two) times daily.  Current Outpatient Medications (Analgesics):  .  acetaminophen (TYLENOL) 325 MG tablet, Take 650 mg by mouth every 6 (six) hours as needed for mild pain or headache. Marland Kitchen.  aspirin 81 MG EC tablet, Take 1 tablet (81 mg total) by mouth daily. Swallow whole.  Current Outpatient Medications (Hematological):  .  vitamin B-12 (CYANOCOBALAMIN) 1000 MCG tablet, Take 1 tablet (1,000 mcg total) by mouth daily.  Current Outpatient Medications (Other):  .  calcium-vitamin D (OSCAL WITH D) 500-200  MG-UNIT tablet, Take 1 tablet by mouth. 50 mcg once a day .  cholecalciferol (VITAMIN D3) 25 MCG (1000 UNIT) tablet, Take 2,000 Units by mouth daily. .  diclofenac Sodium (VOLTAREN) 1 % GEL,  .  doxylamine, Sleep, (UNISOM) 25 MG tablet, Take 50 mg by mouth at bedtime as needed for sleep. .  famotidine (PEPCID) 20 MG tablet, Take 20 mg by mouth at bedtime. .  gabapentin (NEURONTIN) 300 MG capsule, TAKE 1 CAPSULE THREE TIMES DAILY .  pantoprazole (PROTONIX) 40 MG tablet, TAKE 1 TABLET BY MOUTH ONCE DAILY 30-60  MINUTES  BEFORE  FIRST  MEAL  OF  THE  DAY .  Respiratory Therapy Supplies (FLUTTER) DEVI, Use as directed .  Saccharomyces boulardii (PROBIOTIC) 250 MG CAPS,  Take 1 capsule by mouth daily. .  tamsulosin (FLOMAX) 0.4 MG CAPS capsule, Take 0.4 mg by mouth daily.         Medium   Vitamin D deficiency    Last vitamin D Lab Results  Component Value Date   VD25OH 42.42 12/31/2019   Stable, cont oral replacement      Vitamin B12 deficiency    Lab Results  Component Value Date   VITAMINB12 >1526 (H) 12/31/2019   Stable, cont oral replacement - b12 1000 mcg qd       UTI (urinary tract infection)    Has known urethral stricturing and bph, last uti June 2020 - stable overall by history and exam, recent data reviewed with pt, and pt to continue medical treatment as before,  to f/u any worsening symptoms or concerns       Impaired glucose tolerance    Lab Results  Component Value Date   HGBA1C 6.2 12/31/2019   Stable, pt to continue current medical treatment - diet       Hyperlipidemia    Lab Results  Component Value Date   LDLCALC 53 12/31/2019   Stable, pt to continue current statin crestor 20      Essential hypertension    BP Readings from Last 3 Encounters:  01/06/20 110/60  01/05/20 124/76  09/30/19 122/68   Stable, pt to continue medical treatment  - none, for diet, low salt, wt control      CKD (chronic kidney disease), stage III ([place])    Cont to f/u  renal as planned, last labs just done 5 days ago per pt         No orders of the defined types were placed in this encounter.   Follow-up: Return in about 6 months (around 07/05/2020).   Cathlean Cower, MD 01/06/2020 9:22 AM [facility]

## 2020-01-06 NOTE — Assessment & Plan Note (Signed)
Cont to f/u renal as planned, last labs just done 5 days ago per pt

## 2020-01-19 ENCOUNTER — Encounter: Payer: Self-pay | Admitting: Internal Medicine

## 2020-01-19 NOTE — Assessment & Plan Note (Signed)

## 2020-01-19 NOTE — Assessment & Plan Note (Signed)
Stable, cont current tx - albuterol hfa prn  Current Outpatient Medications (Endocrine & Metabolic):  .  predniSONE (DELTASONE) 10 MG tablet, 2 tabs daily until better, then 1 tab daily until return to clinic  Current Outpatient Medications (Cardiovascular):  .  rosuvastatin (CRESTOR) 20 MG tablet, TAKE 1 TABLET EVERY DAY  Current Outpatient Medications (Respiratory):  .  albuterol (PROVENTIL HFA;VENTOLIN HFA) 108 (90 Base) MCG/ACT inhaler, INHALE 2 PUFFS INTO THE LUNGS EVERY 6 (SIX) HOURS AS NEEDED FOR WHEEZING OR SHORTNESS OF BREATH. Marland Kitchen  albuterol (PROVENTIL) (2.5 MG/3ML) 0.083% nebulizer solution, Take 3 mLs (2.5 mg total) by nebulization every 4 (four) hours as needed for wheezing or shortness of breath. .  diphenhydrAMINE (BENADRYL) 25 MG tablet, Take 50 mg by mouth every 6 (six) hours as needed.  .  Fluticasone-Umeclidin-Vilant (TRELEGY ELLIPTA) 100-62.5-25 MCG/INH AEPB, Inhale 1 puff into the lungs daily. .  Fluticasone-Umeclidin-Vilant (TRELEGY ELLIPTA) 100-62.5-25 MCG/INH AEPB, Inhale 1 puff into the lungs daily. .  Guaifenesin 1200 MG TB12, Take 1 tablet by mouth 2 (two) times daily.  Current Outpatient Medications (Analgesics):  .  acetaminophen (TYLENOL) 325 MG tablet, Take 650 mg by mouth every 6 (six) hours as needed for mild pain or headache. Marland Kitchen  aspirin 81 MG EC tablet, Take 1 tablet (81 mg total) by mouth daily. Swallow whole.  Current Outpatient Medications (Hematological):  .  vitamin B-12 (CYANOCOBALAMIN) 1000 MCG tablet, Take 1 tablet (1,000 mcg total) by mouth daily.  Current Outpatient Medications (Other):  .  calcium-vitamin D (OSCAL WITH D) 500-200 MG-UNIT tablet, Take 1 tablet by mouth. 50 mcg once a day .  cholecalciferol (VITAMIN D3) 25 MCG (1000 UNIT) tablet, Take 2,000 Units by mouth daily. .  diclofenac Sodium (VOLTAREN) 1 % GEL,  .  doxylamine, Sleep, (UNISOM) 25 MG tablet, Take 50 mg by mouth at bedtime as needed for sleep. .  famotidine (PEPCID) 20 MG  tablet, Take 20 mg by mouth at bedtime. .  gabapentin (NEURONTIN) 300 MG capsule, TAKE 1 CAPSULE THREE TIMES DAILY .  pantoprazole (PROTONIX) 40 MG tablet, TAKE 1 TABLET BY MOUTH ONCE DAILY 30-60  MINUTES  BEFORE  FIRST  MEAL  OF  THE  DAY .  Respiratory Therapy Supplies (FLUTTER) DEVI, Use as directed .  Saccharomyces boulardii (PROBIOTIC) 250 MG CAPS, Take 1 capsule by mouth daily. .  tamsulosin (FLOMAX) 0.4 MG CAPS capsule, Take 0.4 mg by mouth daily.

## 2020-01-19 NOTE — Assessment & Plan Note (Signed)
Last vitamin D Lab Results  Component Value Date   VD25OH 42.42 12/31/2019   Stable, cont oral replacement

## 2020-01-19 NOTE — Assessment & Plan Note (Signed)
Lab Results  Component Value Date   VITAMINB12 >1526 (H) 12/31/2019   Stable, cont oral replacement - b12 1000 mcg qd

## 2020-01-19 NOTE — Assessment & Plan Note (Signed)
Lab Results  Component Value Date   HGBA1C 6.2 12/31/2019   Stable, pt to continue current medical treatment  - diet  

## 2020-01-19 NOTE — Assessment & Plan Note (Addendum)
Lab Results  Component Value Date   LDLCALC 53 12/31/2019   Stable, pt to continue current statin crestor 20

## 2020-01-19 NOTE — Assessment & Plan Note (Signed)
BP Readings from Last 3 Encounters:  01/06/20 110/60  01/05/20 124/76  09/30/19 122/68   Stable, pt to continue medical treatment  - none, for diet, low salt, wt control

## 2020-01-23 DIAGNOSIS — J449 Chronic obstructive pulmonary disease, unspecified: Secondary | ICD-10-CM | POA: Diagnosis not present

## 2020-01-23 DIAGNOSIS — J9611 Chronic respiratory failure with hypoxia: Secondary | ICD-10-CM | POA: Diagnosis not present

## 2020-01-29 ENCOUNTER — Other Ambulatory Visit: Payer: Self-pay

## 2020-01-29 ENCOUNTER — Encounter (HOSPITAL_COMMUNITY): Payer: Self-pay | Admitting: Emergency Medicine

## 2020-01-29 ENCOUNTER — Inpatient Hospital Stay (HOSPITAL_COMMUNITY)
Admission: EM | Admit: 2020-01-29 | Discharge: 2020-02-01 | DRG: 177 | Disposition: A | Discharge status: Home / Self Care | Payer: Medicare HMO | Attending: Internal Medicine | Admitting: Internal Medicine

## 2020-01-29 ENCOUNTER — Emergency Department (HOSPITAL_COMMUNITY): Payer: Medicare HMO

## 2020-01-29 DIAGNOSIS — Z8249 Family history of ischemic heart disease and other diseases of the circulatory system: Secondary | ICD-10-CM | POA: Diagnosis not present

## 2020-01-29 DIAGNOSIS — G4700 Insomnia, unspecified: Secondary | ICD-10-CM | POA: Diagnosis present

## 2020-01-29 DIAGNOSIS — N179 Acute kidney failure, unspecified: Secondary | ICD-10-CM | POA: Diagnosis not present

## 2020-01-29 DIAGNOSIS — I129 Hypertensive chronic kidney disease with stage 1 through stage 4 chronic kidney disease, or unspecified chronic kidney disease: Secondary | ICD-10-CM | POA: Diagnosis not present

## 2020-01-29 DIAGNOSIS — J1282 Pneumonia due to coronavirus disease 2019: Secondary | ICD-10-CM | POA: Diagnosis not present

## 2020-01-29 DIAGNOSIS — E872 Acidosis: Secondary | ICD-10-CM | POA: Diagnosis not present

## 2020-01-29 DIAGNOSIS — R0789 Other chest pain: Secondary | ICD-10-CM | POA: Diagnosis not present

## 2020-01-29 DIAGNOSIS — J440 Chronic obstructive pulmonary disease with acute lower respiratory infection: Secondary | ICD-10-CM | POA: Diagnosis not present

## 2020-01-29 DIAGNOSIS — R0602 Shortness of breath: Secondary | ICD-10-CM | POA: Diagnosis not present

## 2020-01-29 DIAGNOSIS — N400 Enlarged prostate without lower urinary tract symptoms: Secondary | ICD-10-CM | POA: Diagnosis present

## 2020-01-29 DIAGNOSIS — N1831 Chronic kidney disease, stage 3a: Secondary | ICD-10-CM

## 2020-01-29 DIAGNOSIS — N183 Chronic kidney disease, stage 3 unspecified: Secondary | ICD-10-CM | POA: Diagnosis present

## 2020-01-29 DIAGNOSIS — J441 Chronic obstructive pulmonary disease with (acute) exacerbation: Secondary | ICD-10-CM | POA: Diagnosis not present

## 2020-01-29 DIAGNOSIS — J9601 Acute respiratory failure with hypoxia: Secondary | ICD-10-CM | POA: Diagnosis not present

## 2020-01-29 DIAGNOSIS — U071 COVID-19: Secondary | ICD-10-CM | POA: Diagnosis not present

## 2020-01-29 DIAGNOSIS — J9600 Acute respiratory failure, unspecified whether with hypoxia or hypercapnia: Secondary | ICD-10-CM | POA: Diagnosis present

## 2020-01-29 DIAGNOSIS — N401 Enlarged prostate with lower urinary tract symptoms: Secondary | ICD-10-CM

## 2020-01-29 DIAGNOSIS — E785 Hyperlipidemia, unspecified: Secondary | ICD-10-CM | POA: Diagnosis present

## 2020-01-29 DIAGNOSIS — R079 Chest pain, unspecified: Secondary | ICD-10-CM | POA: Diagnosis not present

## 2020-01-29 DIAGNOSIS — R0902 Hypoxemia: Secondary | ICD-10-CM | POA: Diagnosis not present

## 2020-01-29 DIAGNOSIS — R069 Unspecified abnormalities of breathing: Secondary | ICD-10-CM | POA: Diagnosis not present

## 2020-01-29 DIAGNOSIS — Z825 Family history of asthma and other chronic lower respiratory diseases: Secondary | ICD-10-CM | POA: Diagnosis not present

## 2020-01-29 LAB — CBC
HCT: 45.3 % (ref 39.0–52.0)
Hemoglobin: 14.1 g/dL (ref 13.0–17.0)
MCH: 28.3 pg (ref 26.0–34.0)
MCHC: 31.1 g/dL (ref 30.0–36.0)
MCV: 90.8 fL (ref 80.0–100.0)
Platelets: 136 10*3/uL — ABNORMAL LOW (ref 150–400)
RBC: 4.99 MIL/uL (ref 4.22–5.81)
RDW: 15.7 % — ABNORMAL HIGH (ref 11.5–15.5)
WBC: 11.9 10*3/uL — ABNORMAL HIGH (ref 4.0–10.5)
nRBC: 0 % (ref 0.0–0.2)

## 2020-01-29 LAB — COMPREHENSIVE METABOLIC PANEL
ALT: 14 U/L (ref 0–44)
AST: 16 U/L (ref 15–41)
Albumin: 3.9 g/dL (ref 3.5–5.0)
Alkaline Phosphatase: 38 U/L (ref 38–126)
Anion gap: 15 (ref 5–15)
BUN: 33 mg/dL — ABNORMAL HIGH (ref 8–23)
CO2: 23 mmol/L (ref 22–32)
Calcium: 8.6 mg/dL — ABNORMAL LOW (ref 8.9–10.3)
Chloride: 96 mmol/L — ABNORMAL LOW (ref 98–111)
Creatinine, Ser: 2.06 mg/dL — ABNORMAL HIGH (ref 0.61–1.24)
GFR, Estimated: 32 mL/min — ABNORMAL LOW (ref >60)
Glucose, Bld: 97 mg/dL (ref 70–99)
Potassium: 3.4 mmol/L — ABNORMAL LOW (ref 3.5–5.1)
Sodium: 134 mmol/L — ABNORMAL LOW (ref 135–145)
Total Bilirubin: 1.1 mg/dL (ref 0.3–1.2)
Total Protein: 7.4 g/dL (ref 6.5–8.1)

## 2020-01-29 LAB — I-STAT CHEM 8, ED
BUN: 32 mg/dL — ABNORMAL HIGH (ref 8–23)
Calcium, Ion: 1.13 mmol/L — ABNORMAL LOW (ref 1.15–1.40)
Chloride: 100 mmol/L (ref 98–111)
Creatinine, Ser: 1.9 mg/dL — ABNORMAL HIGH (ref 0.61–1.24)
Glucose, Bld: 81 mg/dL (ref 70–99)
HCT: 45 % (ref 39.0–52.0)
Hemoglobin: 15.3 g/dL (ref 13.0–17.0)
Potassium: 3.5 mmol/L (ref 3.5–5.1)
Sodium: 136 mmol/L (ref 135–145)
TCO2: 26 mmol/L (ref 22–32)

## 2020-01-29 LAB — SARS CORONAVIRUS 2 BY RT PCR (HOSPITAL ORDER, PERFORMED IN ~~LOC~~ HOSPITAL LAB): SARS Coronavirus 2: POSITIVE — AB

## 2020-01-29 LAB — CBC WITH DIFFERENTIAL/PLATELET
Abs Immature Granulocytes: 0.1 10*3/uL — ABNORMAL HIGH (ref 0.00–0.07)
Basophils Absolute: 0 10*3/uL (ref 0.0–0.1)
Basophils Relative: 0 %
Eosinophils Absolute: 0 10*3/uL (ref 0.0–0.5)
Eosinophils Relative: 0 %
HCT: 43.3 % (ref 39.0–52.0)
Hemoglobin: 13.5 g/dL (ref 13.0–17.0)
Immature Granulocytes: 1 %
Lymphocytes Relative: 10 %
Lymphs Abs: 1.2 10*3/uL (ref 0.7–4.0)
MCH: 27.8 pg (ref 26.0–34.0)
MCHC: 31.2 g/dL (ref 30.0–36.0)
MCV: 89.3 fL (ref 80.0–100.0)
Monocytes Absolute: 0.7 10*3/uL (ref 0.1–1.0)
Monocytes Relative: 6 %
Neutro Abs: 9.7 10*3/uL — ABNORMAL HIGH (ref 1.7–7.7)
Neutrophils Relative %: 83 %
Platelets: 137 10*3/uL — ABNORMAL LOW (ref 150–400)
RBC: 4.85 MIL/uL (ref 4.22–5.81)
RDW: 15.6 % — ABNORMAL HIGH (ref 11.5–15.5)
WBC: 11.7 10*3/uL — ABNORMAL HIGH (ref 4.0–10.5)
nRBC: 0 % (ref 0.0–0.2)

## 2020-01-29 LAB — LACTIC ACID, PLASMA
Lactic Acid, Venous: 2.1 mmol/L (ref 0.5–1.9)
Lactic Acid, Venous: 3 mmol/L (ref 0.5–1.9)

## 2020-01-29 LAB — POC SARS CORONAVIRUS 2 AG -  ED: SARS Coronavirus 2 Ag: POSITIVE — AB

## 2020-01-29 LAB — C-REACTIVE PROTEIN: CRP: 20.4 mg/dL — ABNORMAL HIGH (ref <1.0)

## 2020-01-29 LAB — PROCALCITONIN: Procalcitonin: 0.37 ng/mL

## 2020-01-29 LAB — LACTATE DEHYDROGENASE: LDH: 193 U/L — ABNORMAL HIGH (ref 98–192)

## 2020-01-29 LAB — TRIGLYCERIDES: Triglycerides: 126 mg/dL (ref <150)

## 2020-01-29 LAB — TROPONIN I (HIGH SENSITIVITY)
Troponin I (High Sensitivity): 12 ng/L (ref <18)
Troponin I (High Sensitivity): 11 ng/L (ref <18)
Troponin I (High Sensitivity): 11 ng/L (ref <18)

## 2020-01-29 LAB — MRSA PCR SCREENING: MRSA by PCR: NEGATIVE

## 2020-01-29 LAB — FERRITIN: Ferritin: 79 ng/mL (ref 24–336)

## 2020-01-29 LAB — FIBRINOGEN: Fibrinogen: 636 mg/dL — ABNORMAL HIGH (ref 210–475)

## 2020-01-29 LAB — D-DIMER, QUANTITATIVE: D-Dimer, Quant: 0.96 ug/mL-FEU — ABNORMAL HIGH (ref 0.00–0.50)

## 2020-01-29 MED ORDER — IPRATROPIUM BROMIDE 0.02 % IN SOLN
0.5000 mg | Freq: Once | RESPIRATORY_TRACT | Status: AC
  Administered 2020-01-29: 0.5 mg via RESPIRATORY_TRACT
  Filled 2020-01-29: qty 2.5

## 2020-01-29 MED ORDER — GUAIFENESIN-DM 100-10 MG/5ML PO SYRP: 10.0000 mL | ORAL_SOLUTION | Freq: Four times a day (QID) | ORAL | Status: DC | PRN

## 2020-01-29 MED ORDER — SODIUM CHLORIDE 0.9 % IV SOLN
100.0000 mg | Freq: Every day | INTRAVENOUS | Status: DC
  Administered 2020-01-30 – 2020-02-01 (×3): 100 mg via INTRAVENOUS
  Filled 2020-01-29 (×3): qty 20

## 2020-01-29 MED ORDER — ACETAMINOPHEN 650 MG RE SUPP: 650.0000 mg | Freq: Four times a day (QID) | RECTAL | Status: DC | PRN

## 2020-01-29 MED ORDER — ASPIRIN EC 81 MG PO TBEC
81.0000 mg | DELAYED_RELEASE_TABLET | Freq: Every day | ORAL | Status: DC
  Administered 2020-01-29 – 2020-02-01 (×4): 81 mg via ORAL
  Filled 2020-01-29 (×4): qty 1

## 2020-01-29 MED ORDER — ENOXAPARIN SODIUM 40 MG/0.4ML ~~LOC~~ SOLN
40.0000 mg | SUBCUTANEOUS | Status: DC
  Administered 2020-01-29: 40 mg via SUBCUTANEOUS
  Filled 2020-01-29: qty 0.4

## 2020-01-29 MED ORDER — ALBUTEROL SULFATE (2.5 MG/3ML) 0.083% IN NEBU
5.0000 mg | INHALATION_SOLUTION | Freq: Once | RESPIRATORY_TRACT | Status: AC
  Administered 2020-01-29: 5 mg via RESPIRATORY_TRACT
  Filled 2020-01-29 (×2): qty 6

## 2020-01-29 MED ORDER — BARICITINIB 2 MG PO TABS
2.0000 mg | ORAL_TABLET | Freq: Every day | ORAL | Status: DC
  Administered 2020-01-29 – 2020-02-01 (×4): 2 mg via ORAL
  Filled 2020-01-29 (×4): qty 1

## 2020-01-29 MED ORDER — HYDROCOD POLST-CPM POLST ER 10-8 MG/5ML PO SUER
5.0000 mL | Freq: Two times a day (BID) | ORAL | Status: DC
  Administered 2020-01-29 – 2020-02-01 (×5): 5 mL via ORAL
  Filled 2020-01-29 (×6): qty 5

## 2020-01-29 MED ORDER — FLUTICASONE FUROATE-VILANTEROL 100-25 MCG/INH IN AEPB
1.0000 | INHALATION_SPRAY | Freq: Every day | RESPIRATORY_TRACT | Status: DC
  Administered 2020-01-29 – 2020-02-01 (×4): 1 via RESPIRATORY_TRACT
  Filled 2020-01-29: qty 28

## 2020-01-29 MED ORDER — ALBUTEROL SULFATE HFA 108 (90 BASE) MCG/ACT IN AERS: 2.0000 | INHALATION_SPRAY | RESPIRATORY_TRACT | Status: DC | PRN

## 2020-01-29 MED ORDER — PANTOPRAZOLE SODIUM 40 MG PO TBEC
40.0000 mg | DELAYED_RELEASE_TABLET | Freq: Every day | ORAL | Status: DC
  Administered 2020-01-29 – 2020-02-01 (×4): 40 mg via ORAL
  Filled 2020-01-29 (×4): qty 1

## 2020-01-29 MED ORDER — FLUTICASONE-UMECLIDIN-VILANT 100-62.5-25 MCG/INH IN AEPB: 1.0000 | INHALATION_SPRAY | Freq: Every day | RESPIRATORY_TRACT | Status: DC

## 2020-01-29 MED ORDER — SODIUM CHLORIDE 0.9 % IV SOLN
200.0000 mg | Freq: Once | INTRAVENOUS | Status: AC
  Administered 2020-01-29: 200 mg via INTRAVENOUS
  Filled 2020-01-29: qty 200

## 2020-01-29 MED ORDER — DEXAMETHASONE SODIUM PHOSPHATE 10 MG/ML IJ SOLN
6.0000 mg | Freq: Once | INTRAMUSCULAR | Status: AC
  Administered 2020-01-29: 6 mg via INTRAVENOUS
  Filled 2020-01-29: qty 1

## 2020-01-29 MED ORDER — PREDNISONE 50 MG PO TABS: 50.0000 mg | ORAL_TABLET | Freq: Every day | ORAL | Status: DC

## 2020-01-29 MED ORDER — TAMSULOSIN HCL 0.4 MG PO CAPS
0.4000 mg | ORAL_CAPSULE | Freq: Every day | ORAL | Status: DC
  Administered 2020-01-29 – 2020-02-01 (×4): 0.4 mg via ORAL
  Filled 2020-01-29 (×4): qty 1

## 2020-01-29 MED ORDER — ROSUVASTATIN CALCIUM 20 MG PO TABS
20.0000 mg | ORAL_TABLET | Freq: Every day | ORAL | Status: DC
  Administered 2020-01-29 – 2020-02-01 (×4): 20 mg via ORAL
  Filled 2020-01-29 (×4): qty 1

## 2020-01-29 MED ORDER — UMECLIDINIUM BROMIDE 62.5 MCG/INH IN AEPB
1.0000 | INHALATION_SPRAY | Freq: Every day | RESPIRATORY_TRACT | Status: DC
  Administered 2020-01-29: 10:00:00 1 via RESPIRATORY_TRACT
  Filled 2020-01-29: qty 7

## 2020-01-29 MED ORDER — IPRATROPIUM-ALBUTEROL 20-100 MCG/ACT IN AERS
1.0000 | INHALATION_SPRAY | Freq: Four times a day (QID) | RESPIRATORY_TRACT | Status: DC
  Administered 2020-01-29: 1 via RESPIRATORY_TRACT
  Filled 2020-01-29: qty 4

## 2020-01-29 MED ORDER — ACETAMINOPHEN 325 MG PO TABS: 650.0000 mg | ORAL_TABLET | Freq: Four times a day (QID) | ORAL | Status: DC | PRN

## 2020-01-29 MED ORDER — METHYLPREDNISOLONE SODIUM SUCC 40 MG IJ SOLR
40.0000 mg | Freq: Two times a day (BID) | INTRAMUSCULAR | Status: DC
  Administered 2020-01-29 – 2020-02-01 (×6): 40 mg via INTRAVENOUS
  Filled 2020-01-29 (×6): qty 1

## 2020-01-29 MED ORDER — HYDROCOD POLST-CPM POLST ER 10-8 MG/5ML PO SUER: 5.0000 mL | Freq: Two times a day (BID) | ORAL | Status: DC | PRN

## 2020-01-29 MED ORDER — METHYLPREDNISOLONE SODIUM SUCC 40 MG IJ SOLR
40.0000 mg | Freq: Two times a day (BID) | INTRAMUSCULAR | Status: DC
  Administered 2020-01-29: 40 mg via INTRAVENOUS
  Filled 2020-01-29: qty 1

## 2020-01-29 MED ORDER — ALBUTEROL SULFATE HFA 108 (90 BASE) MCG/ACT IN AERS
8.0000 | INHALATION_SPRAY | RESPIRATORY_TRACT | Status: DC
  Administered 2020-01-29 (×3): 8 via RESPIRATORY_TRACT
  Filled 2020-01-29: qty 6.7

## 2020-01-29 MED ORDER — GABAPENTIN 300 MG PO CAPS
300.0000 mg | ORAL_CAPSULE | Freq: Three times a day (TID) | ORAL | Status: DC
  Administered 2020-01-29 – 2020-02-01 (×10): 300 mg via ORAL
  Filled 2020-01-29 (×10): qty 1

## 2020-01-29 MED ORDER — ASCORBIC ACID 500 MG PO TABS
500.0000 mg | ORAL_TABLET | Freq: Every day | ORAL | Status: DC
  Administered 2020-01-29 – 2020-02-01 (×4): 500 mg via ORAL
  Filled 2020-01-29 (×4): qty 1

## 2020-01-29 MED ORDER — GUAIFENESIN-DM 100-10 MG/5ML PO SYRP: 10.0000 mL | ORAL_SOLUTION | ORAL | Status: DC | PRN

## 2020-01-29 MED ORDER — ZINC SULFATE 220 (50 ZN) MG PO CAPS
220.0000 mg | ORAL_CAPSULE | Freq: Every day | ORAL | Status: DC
  Administered 2020-01-29 – 2020-02-01 (×4): 220 mg via ORAL
  Filled 2020-01-29 (×4): qty 1

## 2020-01-29 NOTE — ED Triage Notes (Signed)
Patient arrived with complaints of shortness of breath and very mild central chest pain over the last day. History of COPD.

## 2020-01-29 NOTE — ED Provider Notes (Signed)
[facility] DEPT Provider Note   CSN: [id number] Arrival date & time: 01/29/20  0225     History Chief Complaint  Patient presents with  . Shortness of Breath    Justin Walters is a 80 y.o. male.  The history is provided by the patient.  Shortness of Breath Severity:  Severe Onset quality:  Gradual Duration:  2 days Timing:  Constant Progression:  Unchanged Chronicity:  Recurrent Relieved by:  Nothing Worsened by:  Nothing Ineffective treatments:  Inhaler Associated symptoms: chest pain and wheezing   Associated symptoms: no fever, no rash, no sore throat, no sputum production and no vomiting   Associated symptoms comment:  Sore central chest and low back pain  Patient with COPD presents with SOB and central chest soreness x 2 days.  Is using inhalers Q2 hours without relief as he is short of breath and wheezing.  No f/cr/  Also reports back pain.   Patient is vaccinated for covid 19 without known exposure.      Past Medical History:  Diagnosis Date  . At risk for sleep apnea    STOP-BANG= 5    SENT TO PCP 06-13-2013  . Bladder stone   . BPH (benign prostatic hyperplasia)   . COPD (chronic obstructive pulmonary disease) ([place])   . Diverticulosis of colon   . Dysuria   . Glucose intolerance (impaired glucose tolerance)   . History of kidney stones   . Hyperlipidemia   . Hypertension   . Ureteral disorder 2018   ureteral repair    Patient Active Problem List   Diagnosis Date Noted  . Vitamin D deficiency 01/30/2019  . Vitamin B12 deficiency 01/30/2019  . Arthritis of hand, degenerative 07/30/2018  . Acute on chronic respiratory failure with hypoxia and hypercapnia ([place]) 03/26/2018  . Pulmonary infiltrates on CXR 02/24/2018  . DOE (dyspnea on exertion) 02/22/2018  . Chronic respiratory failure with hypoxia ([place]) 02/22/2018  . Upper airway cough syndrome 11/22/2017  . Occipital neuralgia 08/14/2017  . Right arm pain 08/14/2017  .  GERD (gastroesophageal reflux disease) 05/24/2017  . Thrush 01/18/2017  . Bilateral hearing loss 07/19/2016  . Dizziness 01/04/2016  . Preop exam for internal medicine 01/04/2016  . Cough 04/07/2015  . COPD exacerbation ([place]) 04/07/2015  . Gastroenteritis 01/16/2015  . Skin soreness 05/15/2014  . Pelvic pain in male 03/28/2012  . Headache 03/28/2012  . Chest pain, unspecified 03/28/2012  . Impaired glucose tolerance 01/14/2011  . Preventative health care 01/14/2011  . Encounter for long-term (current) use of other medications 01/09/2011  . UTI (urinary tract infection) 01/08/2009  . BPH (benign prostatic hyperplasia) 01/08/2009  . ERECTILE DYSFUNCTION 04/07/2008  . CKD (chronic kidney disease), stage III ([place]) 10/03/2007  . SKIN LESION 10/03/2007  . PSA, INCREASED 10/03/2007  . Dysuria 07/26/2007  . DIVERTICULOSIS, COLON 02/15/2007  . Hyperlipidemia 09/23/2006  . Essential hypertension 09/23/2006  . RENAL CALCULUS, HX OF 09/23/2006  . COPD GOLD III 08/01/2006    Past Surgical History:  Procedure Laterality Date  . APPENDECTOMY  1980's  . CATARACT EXTRACTION W/ INTRAOCULAR LENS  IMPLANT, BILATERAL    . COLONOSCOPY  10-05-2003   tics only   . CYSTOSCOPY N/A 06/23/2013   Procedure: CYSTOSCOPY WITH LITHOLAPAXY, BLADDER EXPLORATION, REPAIR BLADDER PERFORATION;  Surgeon: Arvil Persons, MD;  Location[facility];  Service: Urology;  Laterality: N/A;  . CYSTOSCOPY/URETEROSCOPY/HOLMIUM LASER/STENT PLACEMENT Left 04/03/2017   Procedure: CYSTOSCOPY LEFT RETROGRADE/LEFT /URETEROSCOPY/HOLMIUM LASER/BASKET STONE EXTRACTION/STENT PLACEMENT/  URETHRAL BIOPSY;  Surgeon: [name], MD;  Location: [facility];  Service: Urology;  Laterality: Left;  . EXTRACORPOREAL SHOCK WAVE LITHOTRIPSY Left 10/23/2016   Procedure: LEFT EXTRACORPOREAL SHOCK WAVE LITHOTRIPSY (ESWL);  Surgeon: Nickie Retort, MD;  Location: [facility];  Service: Urology;  Laterality: Left;  . EXTRACORPOREAL SHOCK  WAVE LITHOTRIPSY Left 01/08/2017   Procedure: LEFT EXTRACORPOREAL SHOCK WAVE LITHOTRIPSY (ESWL);  Surgeon: [name], MD;  Location: [facility];  Service: Urology;  Laterality: Left;  . HOLMIUM LASER APPLICATION N/A [id number]   Procedure: HOLMIUM LASER APPLICATION;  Surgeon: Arvil Persons, MD;  Location[facility];  Service: Urology;  Laterality: N/A;  . LAPAROSCOPIC INGUINAL HERNIA REPAIR Bilateral 10-09-2003  . TRANSURETHRAL RESECTION OF PROSTATE N/A 06/23/2013   Procedure: TRANSURETHRAL RESECTION OF THE PROSTATE;  Surgeon: Arvil Persons, MD;  Location[facility];  Service: Urology;  Laterality: N/A;       Family History  Problem Relation Age of Onset  . COPD Mother   . Cancer Father        unknown  . Emphysema Brother   . Heart failure Brother   . Colon cancer Neg Hx   . Esophageal cancer Neg Hx   . Rectal cancer Neg Hx   . Stomach cancer Neg Hx     Social History   Tobacco Use  . Smoking status: Former Smoker    Packs/day: 3.00    Years: 34.00    Pack years: 102.00    Types: Cigarettes    Quit date: 01/02/1993    Years since quitting: 27.0  . Smokeless tobacco: Never Used  Vaping Use  . Vaping Use: Never used  Substance Use Topics  . Alcohol use: No    Alcohol/week: 4.0 standard drinks    Types: 4 Standard drinks or equivalent per week  . Drug use: No    Home Medications Prior to Admission medications   Medication Sig Start Date End Date Taking? Authorizing Provider  acetaminophen (TYLENOL) 325 MG tablet Take 650 mg by mouth every 6 (six) hours as needed for mild pain or headache.    [provider]  albuterol (PROVENTIL HFA;VENTOLIN HFA) 108 (90 Base) MCG/ACT inhaler INHALE 2 PUFFS INTO THE LUNGS EVERY 6 (SIX) HOURS AS NEEDED FOR WHEEZING OR SHORTNESS OF BREATH. 05/15/17   Biagio Borg, MD  albuterol (PROVENTIL) (2.5 MG/3ML) 0.083% nebulizer solution Take 3 mLs (2.5 mg total) by nebulization every 4 (four) hours as needed for  wheezing or shortness of breath. 03/08/18   Tanda Rockers, MD  aspirin 81 MG EC tablet Take 1 tablet (81 mg total) by mouth daily. Swallow whole. 01/09/17   [name], MD  calcium-vitamin D (OSCAL WITH D) 500-200 MG-UNIT tablet Take 1 tablet by mouth. 50 mcg once a day    [provider]  cholecalciferol (VITAMIN D3) 25 MCG (1000 UNIT) tablet Take 2,000 Units by mouth daily.    [provider]  diclofenac Sodium (VOLTAREN) 1 % GEL  02/04/19   [provider]  diphenhydrAMINE (BENADRYL) 25 MG tablet Take 50 mg by mouth every 6 (six) hours as needed.     [provider]  doxylamine, Sleep, (UNISOM) 25 MG tablet Take 50 mg by mouth at bedtime as needed for sleep.    [provider]  famotidine (PEPCID) 20 MG tablet Take 20 mg by mouth at bedtime.    [provider]  Fluticasone-Umeclidin-Vilant (TRELEGY ELLIPTA) 100-62.5-25 MCG/INH AEPB Inhale 1  puff into the lungs daily. 01/05/20   Tanda Rockers, MD  Fluticasone-Umeclidin-Vilant (TRELEGY ELLIPTA) 100-62.5-25 MCG/INH AEPB Inhale 1 puff into the lungs daily. 01/05/20   Tanda Rockers, MD  gabapentin (NEURONTIN) 300 MG capsule TAKE 1 CAPSULE THREE TIMES DAILY 10/14/19   Biagio Borg, MD  Guaifenesin 1200 MG TB12 Take 1 tablet by mouth 2 (two) times daily.    [provider]  pantoprazole (PROTONIX) 40 MG tablet TAKE 1 TABLET BY MOUTH ONCE DAILY 30-60  MINUTES  BEFORE  FIRST  MEAL  OF  THE  DAY 12/18/19   Tanda Rockers, MD  predniSONE (DELTASONE) 10 MG tablet 2 tabs daily until better, then 1 tab daily until return to clinic 01/05/20   Tanda Rockers, MD  Respiratory Therapy Supplies (FLUTTER) DEVI Use as directed 12/05/17   Tanda Rockers, MD  rosuvastatin (CRESTOR) 20 MG tablet TAKE 1 TABLET EVERY DAY 10/14/19   Biagio Borg, MD  Saccharomyces boulardii (PROBIOTIC) 250 MG CAPS Take 1 capsule by mouth daily.    [provider]  tamsulosin (FLOMAX) 0.4 MG CAPS capsule Take 0.4  mg by mouth daily. 09/04/18   [provider]  vitamin B-12 (CYANOCOBALAMIN) 1000 MCG tablet Take 1 tablet (1,000 mcg total) by mouth daily. 01/30/19   Biagio Borg, MD    Allergies    Patient has no known allergies.  Review of Systems   Review of Systems  Constitutional: Negative for fever.  HENT: Negative for sore throat.   Eyes: Negative for visual disturbance.  Respiratory: Positive for shortness of breath and wheezing. Negative for sputum production.   Cardiovascular: Positive for chest pain.  Gastrointestinal: Negative for vomiting.  Genitourinary: Negative for dysuria.  Musculoskeletal: Positive for back pain.  Skin: Negative for rash.  Neurological: Negative for dizziness.  Psychiatric/Behavioral: Negative for agitation.    Physical Exam Updated Vital Signs BP 118/69 (BP Location: Left Arm)   Pulse (!) 104   Temp 99.3 F (37.4 C) (Oral)   Resp 12   SpO2 97%   Physical Exam Vitals and nursing note reviewed.  Constitutional:      Appearance: Normal appearance. He is not diaphoretic.  HENT:     Head: Normocephalic and atraumatic.     Nose: Nose normal.  Eyes:     Conjunctiva/sclera: Conjunctivae normal.     Pupils: Pupils are equal, round, and reactive to light.  Cardiovascular:     Rate and Rhythm: Normal rate and regular rhythm.     Pulses: Normal pulses.     Heart sounds: Normal heart sounds.  Pulmonary:     Breath sounds: Wheezing present. No rales.  Abdominal:     General: Abdomen is flat. Bowel sounds are normal.     Tenderness: There is no abdominal tenderness. There is no guarding.  Musculoskeletal:        General: Normal range of motion.     Cervical back: Normal range of motion and neck supple.  Skin:    General: Skin is warm and dry.     Capillary Refill: Capillary refill takes less than 2 seconds.  Neurological:     General: No focal deficit present.     Mental Status: He is alert and oriented to person, place, and time.     Deep  Tendon Reflexes: Reflexes normal.  Psychiatric:        Behavior: Behavior normal.     ED Results / Procedures / Treatments   Labs (all  labs ordered are listed, but only abnormal results are displayed) Results for orders placed or performed during the hospital encounter of 01/29/20  CBC with Differential/Platelet  Result Value Ref Range   WBC 11.7 (H) 4.0 - 10.5 K/uL   RBC 4.85 4.22 - 5.81 MIL/uL   Hemoglobin 13.5 13.0 - 17.0 g/dL   HCT 43.3 39.0 - 52.0 %   MCV 89.3 80.0 - 100.0 fL   MCH 27.8 26.0 - 34.0 pg   MCHC 31.2 30.0 - 36.0 g/dL   RDW 15.6 (H) 11.5 - 15.5 %   Platelets 137 (L) 150 - 400 K/uL   nRBC 0.0 0.0 - 0.2 %   Neutrophils Relative % 83 %   Neutro Abs 9.7 (H) 1.7 - 7.7 K/uL   Lymphocytes Relative 10 %   Lymphs Abs 1.2 0.7 - 4.0 K/uL   Monocytes Relative 6 %   Monocytes Absolute 0.7 0.1 - 1.0 K/uL   Eosinophils Relative 0 %   Eosinophils Absolute 0.0 0.0 - 0.5 K/uL   Basophils Relative 0 %   Basophils Absolute 0.0 0.0 - 0.1 K/uL   Immature Granulocytes 1 %   Abs Immature Granulocytes 0.10 (H) 0.00 - 0.07 K/uL  I-stat chem 8, ED (not at [facility] or [facility])  Result Value Ref Range   Sodium 136 135 - 145 mmol/L   Potassium 3.5 3.5 - 5.1 mmol/L   Chloride 100 98 - 111 mmol/L   BUN 32 (H) 8 - 23 mg/dL   Creatinine, Ser 1.90 (H) 0.61 - 1.24 mg/dL   Glucose, Bld 81 70 - 99 mg/dL   Calcium, Ion 1.13 (L) 1.15 - 1.40 mmol/L   TCO2 26 22 - 32 mmol/L   Hemoglobin 15.3 13.0 - 17.0 g/dL   HCT 45.0 39.0 - 52.0 %  POC SARS Coronavirus 2 Ag-ED - Nasal Swab  Result Value Ref Range   SARS Coronavirus 2 Ag POSITIVE (A) NEGATIVE  Troponin I (High Sensitivity)  Result Value Ref Range   Troponin I (High Sensitivity) 12 <18 ng/L   DG Chest Portable 1 View  Result Date: 01/29/2020 CLINICAL DATA:  Shortness of breath EXAM: PORTABLE CHEST 1 VIEW COMPARISON:  March 10, 2019 FINDINGS: Extensive hazy airspace opacification is noted bilaterally, somewhat similar to prior study. There is no  pneumothorax or large pleural effusion. Aortic calcifications are noted. The heart size is stable. There is no acute osseous abnormality. IMPRESSION: Relatively stable diffuse bilateral airspace opacities. These are concerning for underlying pulmonary fibrosis, however a superimposed atypical infectious process is not excluded. Electronically Signed   By: Constance Holster M.D.   On: 01/29/2020 02:52    EKG None  Radiology DG Chest Portable 1 View  Result Date: 01/29/2020 CLINICAL DATA:  Shortness of breath EXAM: PORTABLE CHEST 1 VIEW COMPARISON:  March 10, 2019 FINDINGS: Extensive hazy airspace opacification is noted bilaterally, somewhat similar to prior study. There is no pneumothorax or large pleural effusion. Aortic calcifications are noted. The heart size is stable. There is no acute osseous abnormality. IMPRESSION: Relatively stable diffuse bilateral airspace opacities. These are concerning for underlying pulmonary fibrosis, however a superimposed atypical infectious process is not excluded. Electronically Signed   By: Constance Holster M.D.   On: 01/29/2020 02:52    Procedures Procedures   Medications Ordered in ED Medications  dexamethasone (DECADRON) injection 6 mg (has no administration in time range)  albuterol (PROVENTIL) (2.5 MG/3ML) 0.083% nebulizer solution 5 mg (5 mg Nebulization Given 01/29/20 0326)  ipratropium (ATROVENT)  nebulizer solution 0.5 mg (0.5 mg Nebulization Given 01/29/20 0326)    ED Course  I have reviewed the triage vital signs and the nursing notes.  Pertinent labs & imaging results that were available during my care of the patient were reviewed by me and considered in my medical decision making (see chart for details).    Justin Walters was evaluated in Emergency Department on 01/29/2020 for the symptoms described in the history of present illness. He was evaluated in the context of the global COVID-19 pandemic, which necessitated consideration that the  patient might be at risk for infection with the SARS-CoV-2 virus that causes COVID-19. Institutional protocols and algorithms that pertain to the evaluation of patients at risk for COVID-19 are in a state of rapid change based on information released by regulatory bodies including the CDC and federal and state organizations. These policies and algorithms were followed during the patient's care in the ED.  Final Clinical Impression(s) / ED Diagnoses Final diagnoses:  [id number]   Patient became hypoxic with walking.  Patient will require admission for COVID.   Hospitalists contacted    [place], Adrick Kestler, MD 01/29/20 [id number]

## 2020-01-29 NOTE — H&P (Signed)
History and Physical    Justin Walters WCH:[id number] DOB: 04-Nov-1940 DOA: 01/29/2020  PCP: Biagio Borg, MD  Patient coming from: Home.  Chief Complaint: Shortness of breath.  HPI: Justin Walters is a 80 y.o. male with history of COPD, chronic headaches on gabapentin, BPH, chronic kidney disease stage III presents to the ER because of worsening shortness of breath for the last 2 days.  Has been having some nonproductive cough with pleuritic type of chest pain.  Due to worsening shortness of breath patient presents to the ER.  Patient states he has been vaccinated against Covid infection.  ED Course: In the ER patient was hypoxic requiring 2 L oxygen to maintain sats more than 90% and chest x-ray showed bilateral diffuse infiltrates concerning for pulmonary fibrosis versus infection.  Covid test came back positive and on exam patient is diffusely wheezing.  Patient admitted for COPD exacerbation and COVID-19 infection.  Review of Systems: As per HPI, rest all negative.   Past Medical History:  Diagnosis Date  . At risk for sleep apnea    STOP-BANG= 5    SENT TO PCP 06-13-2013  . Bladder stone   . BPH (benign prostatic hyperplasia)   . COPD (chronic obstructive pulmonary disease) ([place])   . Diverticulosis of colon   . Dysuria   . Glucose intolerance (impaired glucose tolerance)   . History of kidney stones   . Hyperlipidemia   . Hypertension   . Ureteral disorder 2018   ureteral repair    Past Surgical History:  Procedure Laterality Date  . APPENDECTOMY  1980's  . CATARACT EXTRACTION W/ INTRAOCULAR LENS  IMPLANT, BILATERAL    . COLONOSCOPY  10-05-2003   tics only   . CYSTOSCOPY N/A 06/23/2013   Procedure: CYSTOSCOPY WITH LITHOLAPAXY, BLADDER EXPLORATION, REPAIR BLADDER PERFORATION;  Surgeon: Arvil Persons, MD;  Location: [facility];  Service: Urology;  Laterality: N/A;  . CYSTOSCOPY/URETEROSCOPY/HOLMIUM LASER/STENT PLACEMENT Left 04/03/2017   Procedure: CYSTOSCOPY  LEFT RETROGRADE/LEFT /URETEROSCOPY/HOLMIUM LASER/BASKET STONE EXTRACTION/STENT PLACEMENT/ URETHRAL BIOPSY;  Surgeon: [name], MD;  Location: [facility];  Service: Urology;  Laterality: Left;  . EXTRACORPOREAL SHOCK WAVE LITHOTRIPSY Left 10/23/2016   Procedure: LEFT EXTRACORPOREAL SHOCK WAVE LITHOTRIPSY (ESWL);  Surgeon: Nickie Retort, MD;  Location: [facility];  Service: Urology;  Laterality: Left;  . EXTRACORPOREAL SHOCK WAVE LITHOTRIPSY Left 01/08/2017   Procedure: LEFT EXTRACORPOREAL SHOCK WAVE LITHOTRIPSY (ESWL);  Surgeon: [name], MD;  Location: [facility];  Service: Urology;  Laterality: Left;  . HOLMIUM LASER APPLICATION N/A 3/53/6144   Procedure: HOLMIUM LASER APPLICATION;  Surgeon: Arvil Persons, MD;  Location: [facility];  Service: Urology;  Laterality: N/A;  . LAPAROSCOPIC INGUINAL HERNIA REPAIR Bilateral 10-09-2003  . TRANSURETHRAL RESECTION OF PROSTATE N/A 06/23/2013   Procedure: TRANSURETHRAL RESECTION OF THE PROSTATE;  Surgeon: Arvil Persons, MD;  Location: [facility];  Service: Urology;  Laterality: N/A;     reports that he quit smoking about 27 years ago. His smoking use included cigarettes. He has a 102.00 pack-year smoking history. He has never used smokeless tobacco. He reports that he does not drink alcohol and does not use drugs.  No Known Allergies  Family History  Problem Relation Age of Onset  . COPD Mother   . Cancer Father        unknown  . Emphysema Brother   . Heart failure Brother   . Colon cancer Neg Hx   . Esophageal  cancer Neg Hx   . Rectal cancer Neg Hx   . Stomach cancer Neg Hx     Prior to Admission medications   Medication Sig Start Date End Date Taking? Authorizing Provider  acetaminophen (TYLENOL) 325 MG tablet Take 650 mg by mouth every 6 (six) hours as needed for mild pain or headache.   Yes [provider]  albuterol (PROVENTIL HFA;VENTOLIN HFA) 108 (90 Base) MCG/ACT inhaler INHALE 2 PUFFS INTO  THE LUNGS EVERY 6 (SIX) HOURS AS NEEDED FOR WHEEZING OR SHORTNESS OF BREATH. 05/15/17  Yes Corwin LevinsJohn, James W, MD  albuterol (PROVENTIL) (2.5 MG/3ML) 0.083% nebulizer solution Take 3 mLs (2.5 mg total) by nebulization every 4 (four) hours as needed for wheezing or shortness of breath. 03/08/18  Yes Nyoka CowdenWert, Michael B, MD  aspirin 81 MG EC tablet Take 1 tablet (81 mg total) by mouth daily. Swallow whole. 01/09/17  Yes Jerilee FieldEskridge, Matthew, MD  calcium-vitamin D (OSCAL WITH D) 500-200 MG-UNIT tablet Take 1 tablet by mouth. 50 mcg once a day   Yes [provider]  cholecalciferol (VITAMIN D3) 25 MCG (1000 UNIT) tablet Take 2,000 Units by mouth daily.   Yes [provider]  diphenhydrAMINE (BENADRYL) 25 MG tablet Take 50 mg by mouth every 6 (six) hours as needed for allergies.   Yes [provider]  doxylamine, Sleep, (UNISOM) 25 MG tablet Take 50 mg by mouth at bedtime as needed for sleep.   Yes [provider]  Fluticasone-Umeclidin-Vilant (TRELEGY ELLIPTA) 100-62.5-25 MCG/INH AEPB Inhale 1 puff into the lungs daily. 01/05/20  Yes Nyoka CowdenWert, Michael B, MD  gabapentin (NEURONTIN) 300 MG capsule TAKE 1 CAPSULE THREE TIMES DAILY Patient taking differently: Take 300 mg by mouth 3 (three) times daily. 10/14/19  Yes Corwin LevinsJohn, James W, MD  Guaifenesin 1200 MG TB12 Take 1 tablet by mouth 2 (two) times daily as needed (congestion).   Yes [provider]  pantoprazole (PROTONIX) 40 MG tablet TAKE 1 TABLET BY MOUTH ONCE DAILY 30-60  MINUTES  BEFORE  FIRST  MEAL  OF  THE  DAY Patient taking differently: Take 40 mg by mouth daily. 30-60  MINUTES  BEFORE  FIRST  MEAL  OF  THE  DAY 12/18/19  Yes Nyoka CowdenWert, Michael B, MD  predniSONE (DELTASONE) 10 MG tablet 2 tabs daily until better, then 1 tab daily until return to clinic 01/05/20  Yes Nyoka CowdenWert, Michael B, MD  rosuvastatin (CRESTOR) 20 MG tablet TAKE 1 TABLET EVERY DAY Patient taking differently: Take 20 mg by mouth daily. 10/14/19  Yes Corwin LevinsJohn, James W, MD   tamsulosin (FLOMAX) 0.4 MG CAPS capsule Take 0.4 mg by mouth daily. 09/04/18  Yes [provider]  vitamin B-12 (CYANOCOBALAMIN) 1000 MCG tablet Take 1 tablet (1,000 mcg total) by mouth daily. 01/30/19  Yes Corwin LevinsJohn, James W, MD  Fluticasone-Umeclidin-Vilant (TRELEGY ELLIPTA) 100-62.5-25 MCG/INH AEPB Inhale 1 puff into the lungs daily. Patient not taking: Reported on 01/29/2020 01/05/20   Nyoka CowdenWert, Michael B, MD  Respiratory Therapy Supplies (FLUTTER) DEVI Use as directed 12/05/17   Nyoka CowdenWert, Michael B, MD    Physical Exam: Constitutional: Moderately built and nourished. Vitals:   01/29/20 0237 01/29/20 0355  BP: 118/69 (!) 154/95  Pulse: (!) 104 (!) 132  Resp: 12 (!) 21  Temp: 99.3 F (37.4 C)   TempSrc: Oral   SpO2: 97% 95%   Eyes: Anicteric no pallor. ENMT: No discharge from the ears eyes nose or mouth. Neck: No mass felt.  No neck rigidity. Respiratory: Bilateral  expiratory wheeze heard. Cardiovascular: S1-S2 heard. Abdomen: Soft nontender bowel sounds present. Musculoskeletal: No edema. Skin: No rash. Neurologic: Alert awake oriented to time place and person.  Moves all extremities. Psychiatric: Appears normal.  Normal affect.   Labs on Admission: I have personally reviewed following labs and imaging studies  CBC: Recent Labs  Lab 01/29/20 0254 01/29/20 0301  WBC 11.7*  --   NEUTROABS 9.7*  --   HGB 13.5 15.3  HCT 43.3 45.0  MCV 89.3  --   PLT 137*  --    Basic Metabolic Panel: Recent Labs  Lab 01/29/20 0301  NA 136  K 3.5  CL 100  GLUCOSE 81  BUN 32*  CREATININE 1.90*   GFR: CrCl cannot be calculated (Unknown ideal weight.). Liver Function Tests: No results for input(s): AST, ALT, ALKPHOS, BILITOT, PROT, ALBUMIN in the last 168 hours. No results for input(s): LIPASE, AMYLASE in the last 168 hours. No results for input(s): AMMONIA in the last 168 hours. Coagulation Profile: No results for input(s): INR, PROTIME in the last 168 hours. Cardiac Enzymes: No  results for input(s): CKTOTAL, CKMB, CKMBINDEX, TROPONINI in the last 168 hours. BNP (last 3 results) Recent Labs    03/10/19 1006  PROBNP 23.0   HbA1C: No results for input(s): HGBA1C in the last 72 hours. CBG: No results for input(s): GLUCAP in the last 168 hours. Lipid Profile: No results for input(s): CHOL, HDL, LDLCALC, TRIG, CHOLHDL, LDLDIRECT in the last 72 hours. Thyroid Function Tests: No results for input(s): TSH, T4TOTAL, FREET4, T3FREE, THYROIDAB in the last 72 hours. Anemia Panel: No results for input(s): VITAMINB12, FOLATE, FERRITIN, TIBC, IRON, RETICCTPCT in the last 72 hours. Urine analysis:    Component Value Date/Time   COLORURINE YELLOW 12/31/2019 0756   APPEARANCEUR CLEAR 12/31/2019 0756   LABSPEC 1.025 12/31/2019 0756   PHURINE 5.5 12/31/2019 0756   GLUCOSEU NEGATIVE 12/31/2019 0756   HGBUR NEGATIVE 12/31/2019 0756   HGBUR moderate 07/26/2007 0924   BILIRUBINUR NEGATIVE 12/31/2019 0756   BILIRUBINUR 2 05/20/2019 0830   KETONESUR NEGATIVE 12/31/2019 0756   PROTEINUR Positive (A) 05/20/2019 0830   PROTEINUR NEGATIVE 03/26/2018 1056   UROBILINOGEN 0.2 12/31/2019 0756   NITRITE NEGATIVE 12/31/2019 0756   LEUKOCYTESUR NEGATIVE 12/31/2019 0756   Sepsis Labs: @LABRCNTIP (procalcitonin:4,lacticidven:4) ) Recent Results (from the past 240 hour(s))  SARS Coronavirus 2 by RT PCR (hospital order, performed in [facility] hospital lab) Nasopharyngeal Nasopharyngeal Swab     Status: Abnormal   Collection Time: 01/29/20  3:15 AM   Specimen: Nasopharyngeal Swab  Result Value Ref Range Status   SARS Coronavirus 2 POSITIVE (A) NEGATIVE Final    Comment: RESULT CALLED TO, READ BACK BY AND VERIFIED WITH: LYNCH J. 1.27.22 @ 0407 BY MECIAL J. (NOTE) SARS-CoV-2 target nucleic acids are DETECTED  SARS-CoV-2 RNA is generally detectable in upper respiratory specimens  during the acute phase of infection.  Positive results are indicative  of the presence of the identified  virus, but do not rule out bacterial infection or co-infection with other pathogens not detected by the test.  Clinical correlation with patient history and  other diagnostic information is necessary to determine patient infection status.  The expected result is negative.  Fact Sheet for Patients:   StrictlyIdeas.no   Fact Sheet for Healthcare Providers:   BankingDealers.co.za    This test is not yet approved or cleared by the [place] FDA and  has been authorized for detection and/or diagnosis of SARS-CoV-2 by FDA  under an Emergency Use Authorization (EUA).  This EUA will remain in effect (meaning this  test can be used) for the duration of  the COVID-19 declaration under Section 564(b)(1) of the Act, 21 U.S.C. section 360-bbb-3(b)(1), unless the authorization is terminated or revoked sooner.  Performed at [facility], [place] [address], [place], [place] [address]      Radiological Exams on Admission: DG Chest Portable 1 View  Result Date: 01/29/2020 CLINICAL DATA:  Shortness of breath EXAM: PORTABLE CHEST 1 VIEW COMPARISON:  March 10, 2019 FINDINGS: Extensive hazy airspace opacification is noted bilaterally, somewhat similar to prior study. There is no pneumothorax or large pleural effusion. Aortic calcifications are noted. The heart size is stable. There is no acute osseous abnormality. IMPRESSION: Relatively stable diffuse bilateral airspace opacities. These are concerning for underlying pulmonary fibrosis, however a superimposed atypical infectious process is not excluded. Electronically Signed   By: Constance Holster M.D.   On: 01/29/2020 02:52    EKG: Independently reviewed.  Sinus rhythm.  The computer reads it as A. fib.  Assessment/Plan Principal Problem:   Acute respiratory failure due to COVID-19 [facility]) Active Problems:   CKD (chronic kidney disease), stage III (HCC)   BPH (benign prostatic  hyperplasia)   COPD with acute exacerbation ([place])    1. Acute respiratory failure with hypoxia presently on 2 L oxygen to maintain sats more than 90% likely a combination of COPD exacerbation and Covid infection. 2. COVID-19 pneumonia for which patient has been started on IV remdesivir and IV Solu-Medrol.  Presently on 2 L oxygen.  Patient is consented for baricitinib and Actemra if required.  D-dimer CRPs are pending. 3. COPD exacerbation for which patient is on albuterol Trelegy and steroids.  If procalcitonin is elevated may start antibiotics. 4. BPH on Flomax. 5. Chronic headaches on gabapentin. 6. Chronic kidney disease stage III creatinine at around baseline mildly elevated.  Note that patient is also on gabapentin.  Since patient has acute respiratory failure with COPD exacerbation and also Covid infection will need close monitoring for any further worsening in inpatient status.   DVT prophylaxis: Lovenox. Code Status: Full code. Family Communication: Discussed with patient. Disposition Plan: Home. Consults called: None. Admission status: Inpatient.   Rise Patience MD [facility] Pager [phone].  If 7PM-7AM, please contact night-coverage [email or web address] Password [facility]  01/29/2020, 5:40 AM

## 2020-01-29 NOTE — ED Notes (Signed)
Notified Arrien, MD of critical lab value

## 2020-01-29 NOTE — Progress Notes (Signed)
Message sent to MD that patient had arrived on the unit and that his lactic acid is 3.0.  MD acknowledged but gave no new orders

## 2020-01-29 NOTE — ED Notes (Addendum)
Date and time results received: 01/29/20 6:26 AM  Test: lactic acid Critical Value: 2.1  Name of Provider Notified: Dr. Lennox Grumbles  Orders Received? Or Actions Taken?: Actions Taken: MD notified

## 2020-01-29 NOTE — Progress Notes (Signed)
Patient arrives to room 1528 at this time via stretcher from the Ed.  Patient indenendant from stretcher to bed.  Patient oriented to room and use of callbell with stated understanding

## 2020-01-29 NOTE — ED Notes (Signed)
Patient ambulated in the room and O2 decreased to 84% on RA and patient became increasingly short of breath. Patient sat back down and increased back to 95% on RA after a few minutes. Patient is still unable to tolerate lower back pain in certain positions and was using the table to lean on to help decrease back pain and make breathing easier.

## 2020-01-29 NOTE — ED Notes (Signed)
Called report to [place], [place]

## 2020-01-29 NOTE — Progress Notes (Signed)
PROGRESS NOTE    Justin Walters  PJA:[id number] DOB: 03/30/1940 DOA: 01/29/2020 PCP: Biagio Borg, MD    Brief Narrative:  Justin Walters was admitted to the hospital with a working diagnosis of acute hypoxemic respiratory failure due to SARS COVID-19 viral pneumonia.  80 year old male past medical history for COPD, BPH, chronic kidney disease stage III and chronic headaches who presented with dyspnea.  Reported 2 days of nonproductive cough, episodic chest pain.  He has been vaccinated for COVID-19.  Due to persistent symptoms he presented to the ED. On his initial physical examination his ambulatory oximetry on room air was 84%, blood pressure 118/69, heart rate 104-132, respiratory 12-21, temperature 99.3.  Had bilateral expiratory wheezing, heart S1-S2, present, rhythmic, soft abdomen, no lower extremity edema.  Sodium 134, potassium 3.4, chloride 96, bicarb 23, glucose 97, BUN 33, creatinine 2.0, venous lactic acid 2.1, white count 11.9, hemoglobin 14.1, hematocrit 45.3, platelets 136. SARS COVID-19 positive. Chest radiograph with bilateral interstitial infiltrates, predominantly left lower lobe, also positive right upper lobe.  EKG 101 bpm, normal axis, normal intervals, sinus rhythm, no ST segment or T wave changes.  Assessment & Plan:   Principal Problem:   Acute respiratory failure due to COVID-19 [facility]) Active Problems:   CKD (chronic kidney disease), stage III (HCC)   BPH (benign prostatic hyperplasia)   COPD with acute exacerbation ([place])   1. Acute hypoxemic respiratory failure due to SARS COVID 19 viral pneumonia.   RR: 13 to 15  Pulse oxymetry: 95%  Fi02: [place]   COVID-19 Labs  Recent Labs    01/29/20 0506 01/29/20 0511  DDIMER 0.96*  --   FERRITIN  --  79  LDH 193*  --   CRP  --  20.4*    Lab Results  Component Value Date   SARSCOV2NAA POSITIVE (A) 01/29/2020   SARSCOV2NAA NOT DETECTED 03/26/2018    Patient with very high inflammatory markers.  Chest film  with bilateral infiltrates.  Aggressive medical therapy with methylprednisolone and Remdesivir. Considering a CRP of 20, will add baricitinib.   Continue bronchodilator therapy, antitussive agents and airway clearing techniques.  Keep oxygenation 88% or greater.   2. COPD. No signs of acute exacerbation, continue inhaled corticosteroids and bronchodilator therapy.   3. Dyslipidemia. Continue with rosuvastatin.    Patient continue to be at high risk for worsening respiratory failure   Status is: Inpatient  Remains inpatient appropriate because:IV treatments appropriate due to intensity of illness or inability to take PO   Dispo: The patient is from: Home              Anticipated d/c is to: Home              Anticipated d/c date is: 3 days              Patient currently is not medically stable to d/c.   Difficult to place patient No   DVT prophylaxis: Enoxaparin   Code Status:   full  Family Communication:  I was not able to reach his daughter over the phone and her voice mail is full.     Subjective: Patient somnolent at the time of my examination, no nausea or vomiting, denies any significant chest pain.   Objective: Vitals:   01/29/20 0500 01/29/20 0835 01/29/20 1130 01/29/20 1200  BP:  129/84 108/66 (!) 142/70  Pulse:  (!) 105 85 80  Resp:  18 13 15   Temp:    97.7 F (36.5  C)  TempSrc:    Oral  SpO2:  99% 95% 97%  Weight: 81.6 kg      No intake or output data in the 24 hours ending 01/29/20 1421 Filed Weights   01/29/20 0500  Weight: 81.6 kg    Examination:   General: deconditioned and ill looking appearing  Neurology: somnolent, easy to arouse, responding to questions.  E ENT: mild pallor, no icterus, oral mucosa moist Cardiovascular: No JVD. S1-S2 present, rhythmic, no gallops, rubs, or murmurs. No lower extremity edema. Pulmonary: positive breath sounds bilaterally, Gastrointestinal. Abdomen soft and non tender Skin. No rashes Musculoskeletal: no  joint deformities     Data Reviewed: I have personally reviewed following labs and imaging studies  CBC: Recent Labs  Lab 01/29/20 0254 01/29/20 0301 01/29/20 0506  WBC 11.7*  --  11.9*  NEUTROABS 9.7*  --   --   HGB 13.5 15.3 14.1  HCT 43.3 45.0 45.3  MCV 89.3  --  90.8  PLT 137*  --  734*   Basic Metabolic Panel: Recent Labs  Lab 01/29/20 0301 01/29/20 0506  NA 136 134*  K 3.5 3.4*  CL 100 96*  CO2  --  23  GLUCOSE 81 97  BUN 32* 33*  CREATININE 1.90* 2.06*  CALCIUM  --  8.6*   GFR: Estimated Creatinine Clearance: 30.2 mL/min (A) (by C-G formula based on SCr of 2.06 mg/dL (H)). Liver Function Tests: Recent Labs  Lab 01/29/20 0506  AST 16  ALT 14  ALKPHOS 38  BILITOT 1.1  PROT 7.4  ALBUMIN 3.9   No results for input(s): LIPASE, AMYLASE in the last 168 hours. No results for input(s): AMMONIA in the last 168 hours. Coagulation Profile: No results for input(s): INR, PROTIME in the last 168 hours. Cardiac Enzymes: No results for input(s): CKTOTAL, CKMB, CKMBINDEX, TROPONINI in the last 168 hours. BNP (last 3 results) Recent Labs    03/10/19 1006  PROBNP 23.0   HbA1C: No results for input(s): HGBA1C in the last 72 hours. CBG: No results for input(s): GLUCAP in the last 168 hours. Lipid Profile: Recent Labs    01/29/20 0506  TRIG 126   Thyroid Function Tests: No results for input(s): TSH, T4TOTAL, FREET4, T3FREE, THYROIDAB in the last 72 hours. Anemia Panel: Recent Labs    01/29/20 0511  FERRITIN 79      Radiology Studies: I have reviewed all of the imaging during this hospital visit personally     Scheduled Meds: . albuterol  8 puff Inhalation Q4H  . vitamin C  500 mg Oral Daily  . aspirin EC  81 mg Oral Daily  . enoxaparin (LOVENOX) injection  40 mg Subcutaneous Q24H  . fluticasone furoate-vilanterol  1 puff Inhalation Daily  . gabapentin  300 mg Oral TID  . methylPREDNISolone (SOLU-MEDROL) injection  40 mg Intravenous Q12H    Followed by  . [START ON 02/01/2020] predniSONE  50 mg Oral Daily  . pantoprazole  40 mg Oral Daily  . rosuvastatin  20 mg Oral Daily  . tamsulosin  0.4 mg Oral Daily  . umeclidinium bromide  1 puff Inhalation Daily  . zinc sulfate  220 mg Oral Daily   Continuous Infusions: . [START ON 01/30/2020] remdesivir 100 mg in NS 100 mL       LOS: 0 days        Mauricio Gerome Apley, MD

## 2020-01-30 ENCOUNTER — Telehealth: Payer: Self-pay | Admitting: Internal Medicine

## 2020-01-30 DIAGNOSIS — U071 COVID-19: Secondary | ICD-10-CM | POA: Diagnosis not present

## 2020-01-30 DIAGNOSIS — N401 Enlarged prostate with lower urinary tract symptoms: Secondary | ICD-10-CM | POA: Diagnosis not present

## 2020-01-30 DIAGNOSIS — N1831 Chronic kidney disease, stage 3a: Secondary | ICD-10-CM | POA: Diagnosis not present

## 2020-01-30 DIAGNOSIS — J441 Chronic obstructive pulmonary disease with (acute) exacerbation: Secondary | ICD-10-CM | POA: Diagnosis not present

## 2020-01-30 LAB — D-DIMER, QUANTITATIVE: D-Dimer, Quant: 0.64 ug/mL-FEU — ABNORMAL HIGH (ref 0.00–0.50)

## 2020-01-30 LAB — COMPREHENSIVE METABOLIC PANEL
ALT: 14 U/L (ref 0–44)
AST: 15 U/L (ref 15–41)
Albumin: 2.9 g/dL — ABNORMAL LOW (ref 3.5–5.0)
Alkaline Phosphatase: 28 U/L — ABNORMAL LOW (ref 38–126)
Anion gap: 10 (ref 5–15)
BUN: 36 mg/dL — ABNORMAL HIGH (ref 8–23)
CO2: 21 mmol/L — ABNORMAL LOW (ref 22–32)
Calcium: 8.5 mg/dL — ABNORMAL LOW (ref 8.9–10.3)
Chloride: 103 mmol/L (ref 98–111)
Creatinine, Ser: 1.34 mg/dL — ABNORMAL HIGH (ref 0.61–1.24)
GFR, Estimated: 54 mL/min — ABNORMAL LOW (ref >60)
Glucose, Bld: 184 mg/dL — ABNORMAL HIGH (ref 70–99)
Potassium: 4.1 mmol/L (ref 3.5–5.1)
Sodium: 134 mmol/L — ABNORMAL LOW (ref 135–145)
Total Bilirubin: 0.5 mg/dL (ref 0.3–1.2)
Total Protein: 5.9 g/dL — ABNORMAL LOW (ref 6.5–8.1)

## 2020-01-30 LAB — FERRITIN: Ferritin: 92 ng/mL (ref 24–336)

## 2020-01-30 LAB — C-REACTIVE PROTEIN: CRP: 21.6 mg/dL — ABNORMAL HIGH (ref <1.0)

## 2020-01-30 MED ORDER — IPRATROPIUM-ALBUTEROL 20-100 MCG/ACT IN AERS
1.0000 | INHALATION_SPRAY | Freq: Three times a day (TID) | RESPIRATORY_TRACT | Status: DC
  Administered 2020-01-30 – 2020-02-01 (×7): 1 via RESPIRATORY_TRACT

## 2020-01-30 NOTE — Telephone Encounter (Signed)
Spoke with Justin Walters, states she was speaking to MW directly about prednisone clarification and that nothing further was needed.

## 2020-01-30 NOTE — Telephone Encounter (Signed)
LMTCB for [facility] since she was not available

## 2020-01-30 NOTE — Plan of Care (Signed)

## 2020-01-30 NOTE — Progress Notes (Addendum)
PROGRESS NOTE    CARTIER WASHKO  UKG:[id number] DOB: 05/19/1940 DOA: 01/29/2020 PCP: Biagio Borg, MD    Brief Narrative:  Mr. Emond was admitted to the hospital with a working diagnosis of acute hypoxemic respiratory failure due to SARS COVID-19 viral pneumonia.  80 year old male past medical history for COPD, BPH, chronic kidney disease stage III and chronic headaches who presented with dyspnea.  Reported 2 days of nonproductive cough, episodic chest pain.  He has been vaccinated for COVID-19.  Due to persistent symptoms he presented to the ED. On his initial physical examination his ambulatory oximetry on room air was 84%, blood pressure 118/69, heart rate 104-132, respiratory 12-21, temperature 99.3.  Had bilateral expiratory wheezing, heart S1-S2, present, rhythmic, soft abdomen, no lower extremity edema.  Sodium 134, potassium 3.4, chloride 96, bicarb 23, glucose 97, BUN 33, creatinine 2.0, venous lactic acid 2.1, white count 11.9, hemoglobin 14.1, hematocrit 45.3, platelets 136. SARS COVID-19 positive. Chest radiograph with bilateral interstitial infiltrates, predominantly left lower lobe, also positive right upper lobe.  EKG 101 bpm, normal axis, normal intervals, sinus rhythm, no ST segment or T wave changes.  Patient placed on aggressive medical therapy with systemic corticosteroids, remdesivir and baricitinib.    Assessment & Plan:   Principal Problem:   Acute respiratory failure due to COVID-19 [facility]) Active Problems:   CKD (chronic kidney disease), stage III (HCC)   BPH (benign prostatic hyperplasia)   COPD with acute exacerbation ([place])   1. Acute hypoxemic respiratory failure due to SARS COVID 19 viral pneumonia.   RR: 18  Pulse oxymetry: 98%  Fi02: [place]    COVID-19 Labs  Recent Labs    01/29/20 0506 01/29/20 0511 01/30/20 0358  DDIMER 0.96*  --  0.64*  FERRITIN  --  79 92  LDH 193*  --   --   CRP  --  20.4* 21.6*    Lab Results  Component Value Date    SARSCOV2NAA POSITIVE (A) 01/29/2020   SARSCOV2NAA NOT DETECTED 03/26/2018    Patient with worsening inflammatory markers but continue have low oxygen requirements.   Continue  medical therapy with methylprednisolone, Remdesivir and baricitib. On bronchodilator therapy, antitussive agents and airway clearing techniques.   Follow inflammatory markers in am.  Out of bed to chair tid with meals, follow with PT and OT evaluation.    2. COPD. No exacerbation. On inhaled corticosteroids and bronchodilator therapy.   3. Dyslipidemia. On rosuvastatin.   4. AKI on CKD stage 3a/ lactic acidosis. Serum cr this am is 1,34 with K of 4,1 and bicarbonate at 21. Continue to hold on IV fluids, patient is tolerating po well.   Patient continue to be at high risk for worsening respiratory failure   Status is: Inpatient  Remains inpatient appropriate because:IV treatments appropriate due to intensity of illness or inability to take PO   Dispo: The patient is from: Home              Anticipated d/c is to: Home              Anticipated d/c date is: 3 days              Patient currently is not medically stable to d/c.   Difficult to place patient No   DVT prophylaxis: Enoxaparin   Code Status:   full  Family Communication:  I spoke over the phone with the patient's daughter about patient's  condition, plan of care, prognosis and all questions  were addressed.     Subjective: Patient is feeling better but not yet back to baseline, no nausea or vomiting, had difficulty sleeping last night.   Objective: Vitals:   01/29/20 1800 01/29/20 1835 01/29/20 2234 01/30/20 0233  BP: 118/75 120/70 (!) 137/94 120/84  Pulse: 79 78 72 73  Resp: 10  19 18   Temp:  (!) 97.5 F (36.4 C) 97.8 F (36.6 C) 98.1 F (36.7 C)  TempSrc:  Oral  Oral  SpO2: 97%  99% 99%  Weight:       No intake or output data in the 24 hours ending 01/30/20 1201 Filed Weights   01/29/20 0500  Weight: 81.6 kg     Examination:   General: Not in pain or dyspnea, deconditioned  Neurology: Awake and alert, non focal  E ENT: mild pallor, no icterus, oral mucosa moist Cardiovascular: No JVD. S1-S2 present, rhythmic, no gallops, rubs, or murmurs. No lower extremity edema. Pulmonary: positive breath sounds bilaterally, with no wheezing. Gastrointestinal. Abdomen soft and non tender Skin. No rashes Musculoskeletal: no joint deformities     Data Reviewed: I have personally reviewed following labs and imaging studies  CBC: Recent Labs  Lab 01/29/20 0254 01/29/20 0301 01/29/20 0506  WBC 11.7*  --  11.9*  NEUTROABS 9.7*  --   --   HGB 13.5 15.3 14.1  HCT 43.3 45.0 45.3  MCV 89.3  --  90.8  PLT 137*  --  893*   Basic Metabolic Panel: Recent Labs  Lab 01/29/20 0301 01/29/20 0506 01/30/20 0358  NA 136 134* 134*  K 3.5 3.4* 4.1  CL 100 96* 103  CO2  --  23 21*  GLUCOSE 81 97 184*  BUN 32* 33* 36*  CREATININE 1.90* 2.06* 1.34*  CALCIUM  --  8.6* 8.5*   GFR: Estimated Creatinine Clearance: 46.5 mL/min (A) (by C-G formula based on SCr of 1.34 mg/dL (H)). Liver Function Tests: Recent Labs  Lab 01/29/20 0506 01/30/20 0358  AST 16 15  ALT 14 14  ALKPHOS 38 28*  BILITOT 1.1 0.5  PROT 7.4 5.9*  ALBUMIN 3.9 2.9*   No results for input(s): LIPASE, AMYLASE in the last 168 hours. No results for input(s): AMMONIA in the last 168 hours. Coagulation Profile: No results for input(s): INR, PROTIME in the last 168 hours. Cardiac Enzymes: No results for input(s): CKTOTAL, CKMB, CKMBINDEX, TROPONINI in the last 168 hours. BNP (last 3 results) Recent Labs    03/10/19 1006  PROBNP 23.0   HbA1C: No results for input(s): HGBA1C in the last 72 hours. CBG: No results for input(s): GLUCAP in the last 168 hours. Lipid Profile: Recent Labs    01/29/20 0506  TRIG 126   Thyroid Function Tests: No results for input(s): TSH, T4TOTAL, FREET4, T3FREE, THYROIDAB in the last 72 hours. Anemia  Panel: Recent Labs    01/29/20 0511 01/30/20 0358  FERRITIN 79 92      Radiology Studies: I have reviewed all of the imaging during this hospital visit personally     Scheduled Meds: . vitamin C  500 mg Oral Daily  . aspirin EC  81 mg Oral Daily  . baricitinib  2 mg Oral Daily  . chlorpheniramine-HYDROcodone  5 mL Oral Q12H  . fluticasone furoate-vilanterol  1 puff Inhalation Daily  . gabapentin  300 mg Oral TID  . Ipratropium-Albuterol  1 puff Inhalation TID  . methylPREDNISolone (SOLU-MEDROL) injection  40 mg Intravenous Q12H  . pantoprazole  40 mg Oral  Daily  . rosuvastatin  20 mg Oral Daily  . tamsulosin  0.4 mg Oral Daily  . zinc sulfate  220 mg Oral Daily   Continuous Infusions: . remdesivir 100 mg in NS 100 mL 100 mg (01/30/20 1054)     LOS: 1 day        Jiovany Scheffel Gerome Apley, MD

## 2020-01-31 DIAGNOSIS — J441 Chronic obstructive pulmonary disease with (acute) exacerbation: Secondary | ICD-10-CM | POA: Diagnosis not present

## 2020-01-31 DIAGNOSIS — U071 COVID-19: Secondary | ICD-10-CM | POA: Diagnosis not present

## 2020-01-31 DIAGNOSIS — N401 Enlarged prostate with lower urinary tract symptoms: Secondary | ICD-10-CM | POA: Diagnosis not present

## 2020-01-31 DIAGNOSIS — N1831 Chronic kidney disease, stage 3a: Secondary | ICD-10-CM | POA: Diagnosis not present

## 2020-01-31 LAB — COMPREHENSIVE METABOLIC PANEL
ALT: 14 U/L (ref 0–44)
AST: 13 U/L — ABNORMAL LOW (ref 15–41)
Albumin: 2.9 g/dL — ABNORMAL LOW (ref 3.5–5.0)
Alkaline Phosphatase: 28 U/L — ABNORMAL LOW (ref 38–126)
Anion gap: 8 (ref 5–15)
BUN: 44 mg/dL — ABNORMAL HIGH (ref 8–23)
CO2: 27 mmol/L (ref 22–32)
Calcium: 8.8 mg/dL — ABNORMAL LOW (ref 8.9–10.3)
Chloride: 102 mmol/L (ref 98–111)
Creatinine, Ser: 1.5 mg/dL — ABNORMAL HIGH (ref 0.61–1.24)
GFR, Estimated: 47 mL/min — ABNORMAL LOW (ref >60)
Glucose, Bld: 149 mg/dL — ABNORMAL HIGH (ref 70–99)
Potassium: 4.6 mmol/L (ref 3.5–5.1)
Sodium: 137 mmol/L (ref 135–145)
Total Bilirubin: 0.6 mg/dL (ref 0.3–1.2)
Total Protein: 6 g/dL — ABNORMAL LOW (ref 6.5–8.1)

## 2020-01-31 LAB — D-DIMER, QUANTITATIVE: D-Dimer, Quant: 0.58 ug/mL-FEU — ABNORMAL HIGH (ref 0.00–0.50)

## 2020-01-31 LAB — C-REACTIVE PROTEIN: CRP: 13.2 mg/dL — ABNORMAL HIGH (ref <1.0)

## 2020-01-31 LAB — FERRITIN: Ferritin: 104 ng/mL (ref 24–336)

## 2020-01-31 MED ORDER — TRAZODONE HCL 50 MG PO TABS
50.0000 mg | ORAL_TABLET | Freq: Every day | ORAL | Status: DC
  Administered 2020-01-31: 50 mg via ORAL
  Filled 2020-01-31: qty 1

## 2020-01-31 NOTE — Progress Notes (Signed)
Occupational Therapy Evaluation Patient Details Name: Justin Walters MRN: [id number] DOB: [birth year] Today's Date: 01/31/2020    History of Present Illness 80 year old male past medical history for COPD, BPH, chronic kidney disease stage III and chronic headaches who presented with dyspnea. Pt with acute hypoxemic respiratory failure due to SARS COVID-19 viral pneumonia. Vacinnated.   Clinical Impression   Pt states he feels "much better". Pt lives with his daughter and grand daughter, who can provide 24/7 S after DC if needed. Pt close to his functional baseline. Able to ambulate and complete ADL tasks on RA with SpO2 remaining @ 95 without any significant SOB. Educated on energy conservation, activity modification and reducing risk of falls regarding COPD and recovering from Covid. Asked nursing to make sure pt has his incentive spirometer and flutter per orders. Pt very appreciative. No further OT needed.     Follow Up Recommendations  No OT follow up;Supervision - Intermittent    Equipment Recommendations  None recommended by OT    Recommendations for Other Services       Precautions / Restrictions Precautions Precautions: None      Mobility Bed Mobility Overal bed mobility: Independent                  Transfers Overall transfer level: Independent                    Balance                                           ADL either performed or assessed with clinical judgement   ADL Overall ADL's : At baseline                                             Vision Patient Visual Report: Other (comment) (difficulty "focusing" at times)       Perception     Praxis      Pertinent Vitals/Pain Pain Assessment: No/denies pain     Hand Dominance Right   Extremity/Trunk Assessment Upper Extremity Assessment Upper Extremity Assessment: Overall WFL for tasks assessed   Lower Extremity Assessment Lower Extremity  Assessment: Defer to PT evaluation   Cervical / Trunk Assessment Cervical / Trunk Assessment: Normal   Communication Communication Communication: No difficulties   Cognition Arousal/Alertness: Awake/alert Behavior During Therapy: WFL for tasks assessed/performed Overall Cognitive Status: Within Functional Limits for tasks assessed                                     General Comments  SpO2 97 RA at rest; 93; educated pt on importance of energy conservation and activity modification while recovering from Covid. Pt concerned about being around his family. discussed masking adn distances himself as well as frequent hand hygiene    Exercises     Shoulder Instructions      Home Living Family/patient expects to be discharged to:: Private residence Living Arrangements: Children;Other relatives Available Help at Discharge: Available 24 hours/day Type of Home: House Home Access: Stairs to enter Entrance Stairs-Number of Steps: 3 Entrance Stairs-Rails: Right;Left;Can reach both Home Layout: One level     Bathroom Shower/Tub: Hospital doctor  Toilet: Standard Bathroom Accessibility: Yes How Accessible: Accessible via walker Home Equipment: Shower seat          Prior Functioning/Environment Level of Independence: Independent        Comments: drives; 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; Development worker, international [employer] Problem List: Decreased activity tolerance      OT Treatment/Interventions:      OT Goals(Current goals can be found in the care plan section) Acute Rehab OT Goals Patient Stated Goal: to go home OT Goal Formulation: All assessment and education complete, DC therapy  OT Frequency:     Barriers to D/C:            Co-evaluation              AM-PAC OT "6 Clicks" Daily Activity     Outcome Measure Help from another person eating meals?: None Help from another person taking care of personal grooming?: None Help from another person  toileting, which includes using toliet, bedpan, or urinal?: None Help from another person bathing (including washing, rinsing, drying)?: None Help from another person to put on and taking off regular upper body clothing?: None Help from another person to put on and taking off regular lower body clothing?: None 6 Click Score: 24   End of Session Nurse Communication: Mobility status;Other (comment) (need for incentive spiromater)  Activity Tolerance: Patient tolerated treatment well Patient left: in chair;with call bell/phone within reach  OT Visit Diagnosis: Muscle weakness (generalized) (M62.81)                Time: 2725-3664 OT Time Calculation (min): 26 min Charges:  OT General Charges $OT Visit: 1 Visit OT Evaluation $OT Eval Low Complexity: 1 Low OT Treatments $Self Care/Home Management : 8-22 mins  Maurie Boettcher, OT/L   Acute OT Clinical Specialist Acute Rehabilitation Services Pager [phone] Office [phone]   [facility] 01/31/2020, 1:13 PM

## 2020-01-31 NOTE — Plan of Care (Signed)
  Problem: Education: ?Goal: Knowledge of General Education information will improve ?Description: Including pain rating scale, medication(s)/side effects and non-pharmacologic comfort measures ?Outcome: Progressing ?  ?Problem: Clinical Measurements: ?Goal: Will remain free from infection ?Outcome: Progressing ?  ?Problem: Activity: ?Goal: Risk for activity intolerance will decrease ?Outcome: Progressing ?  ?Problem: Coping: ?Goal: Level of anxiety will decrease ?Outcome: Progressing ?  ?Problem: Safety: ?Goal: Ability to remain free from injury will improve ?Outcome: Progressing ?  ?

## 2020-01-31 NOTE — Evaluation (Signed)
Physical Therapy Evaluation-1x Patient Details Name: Justin Walters MRN: [id number] DOB: 1940/03/22 Today's Date: 01/31/2020   History of Present Illness  80 year old male past medical history for COPD, BPH, chronic kidney disease stage III and chronic headaches who presented with dyspnea. Pt with acute hypoxemic respiratory failure due to SARS COVID-19 viral pneumonia.   Clinical Impression  On eval, pt was Mod Ind with mobility. He walked around the room without difficulty. No acute PT needs. 1x eval. Will sign off.     Follow Up Recommendations No PT follow up    Equipment Recommendations  None recommended by PT    Recommendations for Other Services       Precautions / Restrictions Precautions Precautions: None Restrictions Weight Bearing Restrictions: No      Mobility  Bed Mobility               General bed mobility comments: oob in recliner    Transfers Overall transfer level: Independent                  Ambulation/Gait Ambulation/Gait assistance: Modified independent (Device/Increase time) Gait Distance (Feet): 50 Feet Assistive device: IV Pole Gait Pattern/deviations: Step-through pattern     General Gait Details: No LOB. Pt denied dyspnea.  Stairs            Wheelchair Mobility    Modified Rankin (Stroke Patients Only)       Balance                                             Pertinent Vitals/Pain Pain Assessment: No/denies pain    Home Living Family/patient expects to be discharged to:: Private residence Living Arrangements: Children;Other relatives Available Help at Discharge: Available 24 hours/day Type of Home: House Home Access: Stairs to enter Entrance Stairs-Rails: Right;Left;Can reach both Entrance Stairs-Number of Steps: 3 Home Layout: One level Home Equipment: Shower seat      Prior Function Level of Independence: Independent         Comments: drives; 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;  Social research officer, government        Extremity/Trunk Assessment   Upper Extremity Assessment Upper Extremity Assessment: Defer to OT evaluation    Lower Extremity Assessment Lower Extremity Assessment: Overall WFL for tasks assessed    Cervical / Trunk Assessment Cervical / Trunk Assessment: Normal  Communication   Communication: No difficulties  Cognition Arousal/Alertness: Awake/alert Behavior During Therapy: WFL for tasks assessed/performed Overall Cognitive Status: Within Functional Limits for tasks assessed                                        General Comments      Exercises     Assessment/Plan    PT Assessment Patent does not need any further PT services  PT Problem List         PT Treatment Interventions      PT Goals (Current goals can be found in the Care Plan section)  Acute Rehab PT Goals Patient Stated Goal: to go home! PT Goal Formulation: All assessment and education complete, DC therapy    Frequency     Barriers to discharge        Co-evaluation  AM-PAC PT "6 Clicks" Mobility  Outcome Measure Help needed turning from your back to your side while in a flat bed without using bedrails?: None Help needed moving from lying on your back to sitting on the side of a flat bed without using bedrails?: None Help needed moving to and from a bed to a chair (including a wheelchair)?: None Help needed standing up from a chair using your arms (e.g., wheelchair or bedside chair)?: None Help needed to walk in hospital room?: None Help needed climbing 3-5 steps with a railing? : None 6 Click Score: 24    End of Session   Activity Tolerance: Patient tolerated treatment well Patient left: in chair;with call bell/phone within reach        Time: 2409-7353 PT Time Calculation (min) (ACUTE ONLY): 12 min   Charges:   PT Evaluation $PT Eval Low Complexity: [place], PT Acute Rehabilitation   Office: [phone] Pager: [phone]

## 2020-01-31 NOTE — Progress Notes (Signed)
PROGRESS NOTE    Justin Walters  ZOX:[id number] DOB: 15-Sep-1940 DOA: 01/29/2020 PCP: Biagio Borg, MD    Brief Narrative:  Justin Walters was admitted to the hospital with a working diagnosis of acute hypoxemic respiratory failure due to SARS COVID-19 viral pneumonia.  80 year old male past medical history for COPD, BPH, chronic kidney disease stage III and chronic headaches who presented with dyspnea. Reported 2 days of nonproductive cough, episodic chest pain. He has been vaccinated for COVID-19. Due to persistent symptoms he presented to the ED. On his initial physical examination his ambulatory oximetry on room air was 84%, blood pressure 118/69, heart rate 104-132, respiratory 12-21, temperature 99.3. Had bilateral expiratory wheezing, heart S1-S2, present, rhythmic, soft abdomen, no lower extremity edema.  Sodium 134, potassium 3.4, chloride 96, bicarb 23, glucose 97, BUN 33, creatinine 2.0,venous lactic acid 2.1,white count 11.9, hemoglobin 14.1, hematocrit 45.3, platelets 136. SARS COVID-19 positive. Chest radiograph with bilateral interstitial infiltrates, predominantly left lower lobe, also positive right upper lobe.  EKG 101 bpm, normal axis, normal intervals, sinus rhythm, no ST segment or T wave changes.  Patient placed on aggressive medical therapy with systemic corticosteroids, remdesivir and baricitinib.    Assessment & Plan:   Principal Problem:   Acute respiratory failure due to COVID-19 [facility]) Active Problems:   CKD (chronic kidney disease), stage III (HCC)   BPH (benign prostatic hyperplasia)   COPD with acute exacerbation ([place])   1. Acute hypoxemic respiratory failure due to SARS COVID 19 viral pneumonia.  RR: 14  Pulse oxymetry: 98 to 100%  Fi02: [place]    COVID-19 Labs  Recent Labs    01/29/20 0506 01/29/20 0511 01/30/20 0358 01/31/20 0349  DDIMER 0.96*  --  0.64* 0.58*  FERRITIN  --  79 92 104  LDH 193*  --   --   --   CRP  --  20.4* 21.6*  13.2*    Lab Results  Component Value Date   SARSCOV2NAA POSITIVE (A) 01/29/2020   SARSCOV2NAA NOT DETECTED 03/26/2018    Significant improvement in inflammatory markers and symptoms.   On methylprednisolone, Remdesivir and baricitib. Continue with bronchodilator therapy, antitussive agents and airway clearing techniques.   He did very well with OT this am. If continue to improve, plan for dc home in am.   2. COPD. No clinical signs of exacerbation. Continue with inhaled corticosteroids and bronchodilator therapy.   3. Dyslipidemia. Continue with rosuvastatin.  4. AKI on CKD stage 3a/ lactic acidosis. Stable renal function with serum cr t 1,50 with K at 4,6 and bicarbonate at 27. Continue close follow up on renal function and electrolytes.   5. Insomnia. Add trazodone tonight     Status is: Inpatient  Remains inpatient appropriate because:IV treatments appropriate due to intensity of illness or inability to take PO   Dispo: The patient is from: Home              Anticipated d/c is to: Home              Anticipated d/c date is: 1 day              Patient currently is not medically stable to d/c.   Difficult to place patient No   DVT prophylaxis: Enoxaparin   Code Status:   full  Family Communication:  I spoke over the phone with the patient's daugher about patient's  condition, plan of care, prognosis and all questions were addressed.    Subjective: Patient  is feeling better, dyspnea has been improving, no chest pain, no nausea or vomiting.   Objective: Vitals:   01/30/20 1353 01/30/20 1355 01/30/20 2101 01/31/20 0426  BP: (!) 180/99 (!) 159/99 124/81 (!) 135/94  Pulse: 88 80 79 69  Resp: 16  16 14   Temp: 98.1 F (36.7 C)  98.1 F (36.7 C) 97.8 F (36.6 C)  TempSrc: Oral  Oral Oral  SpO2: 100%  98% 100%  Weight:        Intake/Output Summary (Last 24 hours) at 01/31/2020 0840 Last data filed at 01/30/2020 1600 Gross per 24 hour  Intake 100 ml   Output -  Net 100 ml   Filed Weights   01/29/20 0500  Weight: 81.6 kg    Examination:   General: Not in pain or dyspnea Neurology: Awake and alert, non focal  E ENT: no pallor, no icterus, oral mucosa moist Cardiovascular: No JVD. S1-S2 present, rhythmic, no gallops, rubs, or murmurs. No lower extremity edema. Pulmonary: positive breath sounds bilaterally,  Gastrointestinal. Abdomen soft and non tender Skin. No rashes Musculoskeletal: no joint deformities     Data Reviewed: I have personally reviewed following labs and imaging studies  CBC: Recent Labs  Lab 01/29/20 0254 01/29/20 0301 01/29/20 0506  WBC 11.7*  --  11.9*  NEUTROABS 9.7*  --   --   HGB 13.5 15.3 14.1  HCT 43.3 45.0 45.3  MCV 89.3  --  90.8  PLT 137*  --  [id number]*   Basic Metabolic Panel: Recent Labs  Lab 01/29/20 0301 01/29/20 0506 01/30/20 0358 01/31/20 0349  NA 136 134* 134* 137  K 3.5 3.4* 4.1 4.6  CL 100 96* 103 102  CO2  --  23 21* 27  GLUCOSE 81 97 184* 149*  BUN 32* 33* 36* 44*  CREATININE 1.90* 2.06* 1.34* 1.50*  CALCIUM  --  8.6* 8.5* 8.8*   GFR: Estimated Creatinine Clearance: 41.5 mL/min (A) (by C-G formula based on SCr of 1.5 mg/dL (H)). Liver Function Tests: Recent Labs  Lab 01/29/20 0506 01/30/20 0358 01/31/20 0349  AST 16 15 13*  ALT 14 14 14   ALKPHOS 38 28* 28*  BILITOT 1.1 0.5 0.6  PROT 7.4 5.9* 6.0*  ALBUMIN 3.9 2.9* 2.9*   No results for input(s): LIPASE, AMYLASE in the last 168 hours. No results for input(s): AMMONIA in the last 168 hours. Coagulation Profile: No results for input(s): INR, PROTIME in the last 168 hours. Cardiac Enzymes: No results for input(s): CKTOTAL, CKMB, CKMBINDEX, TROPONINI in the last 168 hours. BNP (last 3 results) Recent Labs    03/10/19 1006  PROBNP 23.0   HbA1C: No results for input(s): HGBA1C in the last 72 hours. CBG: No results for input(s): GLUCAP in the last 168 hours. Lipid Profile: Recent Labs    01/29/20 0506   TRIG 126   Thyroid Function Tests: No results for input(s): TSH, T4TOTAL, FREET4, T3FREE, THYROIDAB in the last 72 hours. Anemia Panel: Recent Labs    01/30/20 0358 01/31/20 0349  FERRITIN 92 104      Radiology Studies: I have reviewed all of the imaging during this hospital visit personally     Scheduled Meds: . vitamin C  500 mg Oral Daily  . aspirin EC  81 mg Oral Daily  . baricitinib  2 mg Oral Daily  . chlorpheniramine-HYDROcodone  5 mL Oral Q12H  . fluticasone furoate-vilanterol  1 puff Inhalation Daily  . gabapentin  300 mg Oral TID  .  Ipratropium-Albuterol  1 puff Inhalation TID  . methylPREDNISolone (SOLU-MEDROL) injection  40 mg Intravenous Q12H  . pantoprazole  40 mg Oral Daily  . rosuvastatin  20 mg Oral Daily  . tamsulosin  0.4 mg Oral Daily  . zinc sulfate  220 mg Oral Daily   Continuous Infusions: . remdesivir 100 mg in NS 100 mL 100 mg (01/30/20 1054)     LOS: 2 days        Mauricio Gerome Apley, MD

## 2020-02-01 DIAGNOSIS — J441 Chronic obstructive pulmonary disease with (acute) exacerbation: Secondary | ICD-10-CM | POA: Diagnosis not present

## 2020-02-01 DIAGNOSIS — U071 COVID-19: Secondary | ICD-10-CM | POA: Diagnosis not present

## 2020-02-01 DIAGNOSIS — N401 Enlarged prostate with lower urinary tract symptoms: Secondary | ICD-10-CM | POA: Diagnosis not present

## 2020-02-01 DIAGNOSIS — N1831 Chronic kidney disease, stage 3a: Secondary | ICD-10-CM | POA: Diagnosis not present

## 2020-02-01 LAB — FERRITIN: Ferritin: 91 ng/mL (ref 24–336)

## 2020-02-01 LAB — D-DIMER, QUANTITATIVE: D-Dimer, Quant: 0.67 ug/mL-FEU — ABNORMAL HIGH (ref 0.00–0.50)

## 2020-02-01 LAB — C-REACTIVE PROTEIN: CRP: 8.8 mg/dL — ABNORMAL HIGH (ref <1.0)

## 2020-02-01 LAB — COMPREHENSIVE METABOLIC PANEL
ALT: 16 U/L (ref 0–44)
AST: 15 U/L (ref 15–41)
Albumin: 3.2 g/dL — ABNORMAL LOW (ref 3.5–5.0)
Alkaline Phosphatase: 29 U/L — ABNORMAL LOW (ref 38–126)
Anion gap: 11 (ref 5–15)
BUN: 38 mg/dL — ABNORMAL HIGH (ref 8–23)
CO2: 25 mmol/L (ref 22–32)
Calcium: 8.7 mg/dL — ABNORMAL LOW (ref 8.9–10.3)
Chloride: 100 mmol/L (ref 98–111)
Creatinine, Ser: 1.39 mg/dL — ABNORMAL HIGH (ref 0.61–1.24)
GFR, Estimated: 52 mL/min — ABNORMAL LOW (ref >60)
Glucose, Bld: 143 mg/dL — ABNORMAL HIGH (ref 70–99)
Potassium: 4.4 mmol/L (ref 3.5–5.1)
Sodium: 136 mmol/L (ref 135–145)
Total Bilirubin: 0.5 mg/dL (ref 0.3–1.2)
Total Protein: 6.3 g/dL — ABNORMAL LOW (ref 6.5–8.1)

## 2020-02-01 MED ORDER — PREDNISONE 20 MG PO TABS: 20.0000 mg | ORAL_TABLET | Freq: Every day | ORAL | Status: DC

## 2020-02-01 MED ORDER — GUAIFENESIN-DM 100-10 MG/5ML PO SYRP: 5.0000 mL | ORAL_SOLUTION | Freq: Four times a day (QID) | ORAL | 0 refills | Status: DC | PRN

## 2020-02-01 MED ORDER — PREDNISONE 20 MG PO TABS
20.0000 mg | ORAL_TABLET | Freq: Every day | ORAL | 0 refills | Status: AC
Start: 2020-02-02 — End: 2020-02-07

## 2020-02-01 NOTE — Discharge Summary (Signed)
Physician Discharge Summary  [name]:[id number] DOB: [birth year] DOA: 01/29/2020  PCP: Biagio Borg, MD  Admit date: 01/29/2020 Discharge date: 02/01/2020  Admitted From: Home  Disposition:  Home   Recommendations for Outpatient Follow-up and new medication changes:  1. Follow up with Dr. Jenny Reichmann in 2 weeks.  2. Continue self quarantine for 10 more days, use a mask in public and maintain physical distancing.   Home Health:  No   Equipment/Devices: no    Discharge Condition: stable  CODE STATUS: full  Diet recommendation: heart healthy   Brief/Interim Summary: [name] was admitted to the hospital with a working diagnosis of acute hypoxemic respiratory failure due to SARS COVID-19 viral pneumonia.  80 year old male past medical history for COPD, BPH, chronic kidney disease stage IIIa and chronic headaches who presented with dyspnea. Reported 2 days of nonproductive cough, episodic chest pain. He has been vaccinated for COVID-19. Due to persistent symptoms he presented to the ED. On his initial physical examination his ambulatory oximetry on room air was 84%, blood pressure 118/69, heart rate 104-132, respiratory rate 12-21, temperature 99.3. Had bilateral expiratory wheezing, heart S1-S2, present, rhythmic, soft abdomen, no lower extremity edema.  Sodium 134, potassium 3.4, chloride 96, bicarb 23, glucose 97, BUN 33, creatinine 2.0,venous lactic acid 2.1,white count 11.9, hemoglobin 14.1, hematocrit 45.3, platelets 136. SARS COVID-19 positive. Chest radiograph with bilateral interstitial infiltrates, predominantly left lower lobe, also positive right upper lobe.  EKG 101 bpm, normal axis, normal intervals, sinus rhythm, no ST segment or T wave changes.  Patient placed on aggressive medical therapy with systemic corticosteroids, remdesivir and baricitinib.  1.  Acute hypoxemic respiratory failure due to SARS COVID-19 viral pneumonia. Patient was admitted to the medical  ward, he was placed on the remote telemetry monitor, received supplemental oxygen per nasal cannula, systemic corticosteroids, remdesivir and baricitinib.  His symptoms, oxygenation and inflammatory markers improved.  COVID-19 Labs  Recent Labs    01/30/20 0358 01/31/20 0349 02/01/20 0337  DDIMER 0.64* 0.58* 0.67*  FERRITIN 92 104 91  CRP 21.6* 13.2* 8.8*    Lab Results  Component Value Date   SARSCOV2NAA POSITIVE (A) 01/29/2020   SARSCOV2NAA NOT DETECTED 03/26/2018   At the time of his discharge his oxygenation was 96% on room air, he will have a ambulatory oxymetry before discharge.  Patient will follow-up as an outpatient within 2 weeks. Continue prednisone for 5 more days.   2.  COPD.  No signs of acute exacerbation, continue bronchodilator therapy. Resume inhaled corticosteroids and home dose of oral prednisone.   3.  Acute kidney injury chronic kidney disease stage IIIa, lactic acidosis.  Patient received supportive medical therapy, at discharge his creatinine is 1.39, BUN 38, sodium 136, potassium 4.4, chloride 100, bicarb 25. Follow-up kidney function as an outpatient.  4.  Dyslipidemia.  Continue with rosuvastatin.  5.  Trazodone insomnia.  Patient was placed on trazodone during his hospitalization for sleep.     Discharge Diagnoses:  Principal Problem:   Acute respiratory failure due to COVID-19 [facility]) Active Problems:   CKD (chronic kidney disease), stage III (HCC)   BPH (benign prostatic hyperplasia)   COPD with acute exacerbation [facility])    Discharge Instructions   Allergies as of 02/01/2020   No Known Allergies     Medication List    TAKE these medications   acetaminophen 325 MG tablet Commonly known as: TYLENOL Take 650 mg by mouth every 6 (six) hours as needed for mild pain  or headache.   albuterol 108 (90 Base) MCG/ACT inhaler Commonly known as: VENTOLIN HFA INHALE 2 PUFFS INTO THE LUNGS EVERY 6 (SIX) HOURS AS NEEDED FOR WHEEZING OR  SHORTNESS OF BREATH.   albuterol (2.5 MG/3ML) 0.083% nebulizer solution Commonly known as: PROVENTIL Take 3 mLs (2.5 mg total) by nebulization every 4 (four) hours as needed for wheezing or shortness of breath.   aspirin 81 MG EC tablet Take 1 tablet (81 mg total) by mouth daily. Swallow whole.   calcium-vitamin D 500-200 MG-UNIT tablet Commonly known as: OSCAL WITH D Take 1 tablet by mouth. 50 mcg once a day   cholecalciferol 25 MCG (1000 UNIT) tablet Commonly known as: VITAMIN D3 Take 2,000 Units by mouth daily.   diphenhydrAMINE 25 MG tablet Commonly known as: BENADRYL Take 50 mg by mouth every 6 (six) hours as needed for allergies.   doxylamine (Sleep) 25 MG tablet Commonly known as: UNISOM Take 50 mg by mouth at bedtime as needed for sleep.   Flutter Devi Use as directed   gabapentin 300 MG capsule Commonly known as: NEURONTIN TAKE 1 CAPSULE THREE TIMES DAILY What changed:   how much to take  how to take this  when to take this  additional instructions   Guaifenesin 1200 MG Tb12 Take 1 tablet by mouth 2 (two) times daily as needed (congestion).   guaiFENesin-dextromethorphan 100-10 MG/5ML syrup Commonly known as: ROBITUSSIN DM Take 5 mLs by mouth every 6 (six) hours as needed for cough.   pantoprazole 40 MG tablet Commonly known as: PROTONIX TAKE 1 TABLET BY MOUTH ONCE DAILY 30-60  MINUTES  BEFORE  FIRST  MEAL  OF  THE  DAY What changed: See the new instructions.   predniSONE 10 MG tablet Commonly known as: DELTASONE 2 tabs daily until better, then 1 tab daily until return to clinic What changed: Another medication with the same name was added. Make sure you understand how and when to take each.   predniSONE 20 MG tablet Commonly known as: DELTASONE Take 1 tablet (20 mg total) by mouth daily with breakfast for 5 days. Start taking on: February 02, 2020 What changed: You were already taking a medication with the same name, and this prescription was  added. Make sure you understand how and when to take each.   rosuvastatin 20 MG tablet Commonly known as: CRESTOR TAKE 1 TABLET EVERY DAY   tamsulosin 0.4 MG Caps capsule Commonly known as: FLOMAX Take 0.4 mg by mouth daily.   Trelegy Ellipta 100-62.5-25 MCG/INH Aepb Generic drug: Fluticasone-Umeclidin-Vilant Inhale 1 puff into the lungs daily. What changed: Another medication with the same name was removed. Continue taking this medication, and follow the directions you see here.   vitamin B-12 1000 MCG tablet Commonly known as: CYANOCOBALAMIN Take 1 tablet (1,000 mcg total) by mouth daily.       No Known Allergies      Procedures/Studies: DG Chest Portable 1 View  Result Date: 01/29/2020 CLINICAL DATA:  Shortness of breath EXAM: PORTABLE CHEST 1 VIEW COMPARISON:  March 10, 2019 FINDINGS: Extensive hazy airspace opacification is noted bilaterally, somewhat similar to prior study. There is no pneumothorax or large pleural effusion. Aortic calcifications are noted. The heart size is stable. There is no acute osseous abnormality. IMPRESSION: Relatively stable diffuse bilateral airspace opacities. These are concerning for underlying pulmonary fibrosis, however a superimposed atypical infectious process is not excluded. Electronically Signed   By: Constance Holster M.D.   On: 01/29/2020 02:52  Subjective: Patient is feeling better, dyspnea is improving, he has been able to work with PT and OT with no oxygen desaturation   Discharge Exam: Vitals:   01/31/20 2100 02/01/20 0417  BP:  (!) 138/93  Pulse:  70  Resp:  16  Temp:  97.9 F (36.6 C)  SpO2: 96% 96%   Vitals:   01/31/20 1306 01/31/20 2037 01/31/20 2100 02/01/20 0417  BP: (!) 151/93 (!) 151/92  (!) 138/93  Pulse: 72 74  70  Resp: 18 20  16   Temp: 97.7 F (36.5 C) 97.8 F (36.6 C)  97.9 F (36.6 C)  TempSrc:  Oral  Oral  SpO2: 99% 98% 96% 96%  Weight:        General: Not in pain or dyspnea.   Neurology: Awake and alert, non focal  E ENT: mild pallor, no icterus, oral mucosa moist Cardiovascular: No JVD. S1-S2 present, rhythmic, no gallops, rubs, or murmurs. No lower extremity edema. Pulmonary: positive breath sounds bilaterally,  Gastrointestinal. Abdomen soft and non tender Skin. No rashes Musculoskeletal: no joint deformities   The results of significant diagnostics from this hospitalization (including imaging, microbiology, ancillary and laboratory) are listed below for reference.     Microbiology: Recent Results (from the past 240 hour(s))  SARS Coronavirus 2 by RT PCR (hospital order, performed in [facility] hospital lab) Nasopharyngeal Nasopharyngeal Swab     Status: Abnormal   Collection Time: 01/29/20  3:15 AM   Specimen: Nasopharyngeal Swab  Result Value Ref Range Status   SARS Coronavirus 2 POSITIVE (A) NEGATIVE Final    Comment: RESULT CALLED TO, READ BACK BY AND VERIFIED WITH: LYNCH J. 1.27.22 @ 0407 BY MECIAL J. (NOTE) SARS-CoV-2 target nucleic acids are DETECTED  SARS-CoV-2 RNA is generally detectable in upper respiratory specimens  during the acute phase of infection.  Positive results are indicative  of the presence of the identified virus, but do not rule out bacterial infection or co-infection with other pathogens not detected by the test.  Clinical correlation with patient history and  other diagnostic information is necessary to determine patient infection status.  The expected result is negative.  Fact Sheet for Patients:   StrictlyIdeas.no   Fact Sheet for Healthcare Providers:   BankingDealers.co.za    This test is not yet approved or cleared by the [place] FDA and  has been authorized for detection and/or diagnosis of SARS-CoV-2 by FDA under an Emergency Use Authorization (EUA).  This EUA will remain in effect (meaning this  test can be used) for the duration of  the COVID-19  declaration under Section 564(b)(1) of the Act, 21 U.S.C. section 360-bbb-3(b)(1), unless the authorization is terminated or revoked sooner.  Performed at [facility], [place] [address], [place], [place] [address]   Blood Culture (routine x 2)     Status: None (Preliminary result)   Collection Time: 01/29/20  5:05 AM   Specimen: BLOOD  Result Value Ref Range Status   Specimen Description   Final    BLOOD RIGHT ANTECUBITAL Performed at [place] [address] [place], [place] [address]    Special Requests   Final    BOTTLES DRAWN AEROBIC AND ANAEROBIC Blood Culture adequate volume Performed at [place] [address] [place], [place] [address]    Culture   Final    NO GROWTH 3 DAYS Performed at [facility], [place] [address], [place], [place] [address]    Report  Status PENDING  Incomplete  MRSA PCR Screening     Status: None   Collection Time: 01/29/20 12:52 PM   Specimen: Nasopharyngeal  Result Value Ref Range Status   MRSA by PCR NEGATIVE NEGATIVE Final    Comment:        The GeneXpert MRSA Assay (FDA approved for NASAL specimens only), is one component of a comprehensive MRSA colonization surveillance program. It is not intended to diagnose MRSA infection nor to guide or monitor treatment for MRSA infections. Performed at [facility], [place] [address] [place], [place] [address]      Labs: BNP (last 3 results) No results for input(s): BNP in the last 8760 hours. Basic Metabolic Panel: Recent Labs  Lab 01/29/20 0301 01/29/20 0506 01/30/20 0358 01/31/20 0349 02/01/20 0337  NA 136 134* 134* 137 136  K 3.5 3.4* 4.1 4.6 4.4  CL 100 96* 103 102 100  CO2  --  23 21* 27 25  GLUCOSE 81 97 184* 149* 143*  BUN 32* 33* 36* 44* 38*  CREATININE 1.90* 2.06* 1.34* 1.50* 1.39*  CALCIUM  --  8.6* 8.5* 8.8* 8.7*   Liver Function Tests: Recent Labs  Lab 01/29/20 0506 01/30/20 0358  01/31/20 0349 02/01/20 0337  AST 16 15 13* 15  ALT 14 14 14 16   ALKPHOS 38 28* 28* 29*  BILITOT 1.1 0.5 0.6 0.5  PROT 7.4 5.9* 6.0* 6.3*  ALBUMIN 3.9 2.9* 2.9* 3.2*   No results for input(s): LIPASE, AMYLASE in the last 168 hours. No results for input(s): AMMONIA in the last 168 hours. CBC: Recent Labs  Lab 01/29/20 0254 01/29/20 0301 01/29/20 0506  WBC 11.7*  --  11.9*  NEUTROABS 9.7*  --   --   HGB 13.5 15.3 14.1  HCT 43.3 45.0 45.3  MCV 89.3  --  90.8  PLT 137*  --  136*   Cardiac Enzymes: No results for input(s): CKTOTAL, CKMB, CKMBINDEX, TROPONINI in the last 168 hours. BNP: Invalid input(s): POCBNP CBG: No results for input(s): GLUCAP in the last 168 hours. D-Dimer Recent Labs    01/31/20 0349 02/01/20 0337  DDIMER 0.58* 0.67*   Hgb A1c No results for input(s): HGBA1C in the last 72 hours. Lipid Profile No results for input(s): CHOL, HDL, LDLCALC, TRIG, CHOLHDL, LDLDIRECT in the last 72 hours. Thyroid function studies No results for input(s): TSH, T4TOTAL, T3FREE, THYROIDAB in the last 72 hours.  Invalid input(s): FREET3 Anemia work up Recent Labs    01/31/20 0349 02/01/20 0337  FERRITIN 104 91   Urinalysis    Component Value Date/Time   COLORURINE YELLOW 12/31/2019 0756   APPEARANCEUR CLEAR 12/31/2019 0756   LABSPEC 1.025 12/31/2019 0756   PHURINE 5.5 12/31/2019 0756   GLUCOSEU NEGATIVE 12/31/2019 0756   HGBUR NEGATIVE 12/31/2019 0756   HGBUR moderate 07/26/2007 0924   BILIRUBINUR NEGATIVE 12/31/2019 0756   BILIRUBINUR 2 05/20/2019 0830   KETONESUR NEGATIVE 12/31/2019 0756   PROTEINUR Positive (A) 05/20/2019 0830   PROTEINUR NEGATIVE 03/26/2018 1056   UROBILINOGEN 0.2 12/31/2019 0756   NITRITE NEGATIVE 12/31/2019 0756   LEUKOCYTESUR NEGATIVE 12/31/2019 0756   Sepsis Labs Invalid input(s): PROCALCITONIN,  WBC,  LACTICIDVEN Microbiology Recent Results (from the past 240 hour(s))  SARS Coronavirus 2 by RT PCR (hospital order, performed  in [place] hospital lab) Nasopharyngeal Nasopharyngeal Swab     Status: Abnormal   Collection Time: 01/29/20  3:15 AM   Specimen: Nasopharyngeal Swab  Result Value Ref Range  Status   SARS Coronavirus 2 POSITIVE (A) NEGATIVE Final    Comment: RESULT CALLED TO, READ BACK BY AND VERIFIED WITH: LYNCH J. 1.27.22 @ 0407 BY MECIAL J. (NOTE) SARS-CoV-2 target nucleic acids are DETECTED  SARS-CoV-2 RNA is generally detectable in upper respiratory specimens  during the acute phase of infection.  Positive results are indicative  of the presence of the identified virus, but do not rule out bacterial infection or co-infection with other pathogens not detected by the test.  Clinical correlation with patient history and  other diagnostic information is necessary to determine patient infection status.  The expected result is negative.  Fact Sheet for Patients:   StrictlyIdeas.no   Fact Sheet for Healthcare Providers:   BankingDealers.co.za    This test is not yet approved or cleared by the [place] FDA and  has been authorized for detection and/or diagnosis of SARS-CoV-2 by FDA under an Emergency Use Authorization (EUA).  This EUA will remain in effect (meaning this  test can be used) for the duration of  the COVID-19 declaration under Section 564(b)(1) of the Act, 21 U.S.C. section 360-bbb-3(b)(1), unless the authorization is terminated or revoked sooner.  Performed at [facility], [place] [address], [place], [place] [address]   Blood Culture (routine x 2)     Status: None (Preliminary result)   Collection Time: 01/29/20  5:05 AM   Specimen: BLOOD  Result Value Ref Range Status   Specimen Description   Final    BLOOD RIGHT ANTECUBITAL Performed at [place] [address] [place], [place] [address]    Special Requests   Final    BOTTLES DRAWN AEROBIC AND ANAEROBIC Blood Culture adequate  volume Performed at [place] [address] [place], [place] [address]    Culture   Final    NO GROWTH 3 DAYS Performed at [facility], [place] [address] [place], [place] [address]    Report Status PENDING  Incomplete  MRSA PCR Screening     Status: None   Collection Time: 01/29/20 12:52 PM   Specimen: Nasopharyngeal  Result Value Ref Range Status   MRSA by PCR NEGATIVE NEGATIVE Final    Comment:        The GeneXpert MRSA Assay (FDA approved for NASAL specimens only), is one component of a comprehensive MRSA colonization surveillance program. It is not intended to diagnose MRSA infection nor to guide or monitor treatment for MRSA infections. Performed at [facility], [place] [address], [place],  [address]      Time coordinating discharge: 45 minutes  SIGNED:   Tawni Millers, MD  [facility] 02/01/2020, 9:16 AM

## 2020-02-01 NOTE — Progress Notes (Signed)
  Patient Saturations on Room Air at Rest = 93%  Patient Saturations on [employer] while Ambulating = 89%  Patient Saturations on 0 Liters of oxygen while Ambulating = 95%

## 2020-02-03 LAB — CULTURE, BLOOD (ROUTINE X 2)
Culture: NO GROWTH
Special Requests: ADEQUATE

## 2020-02-04 ENCOUNTER — Other Ambulatory Visit: Payer: Self-pay

## 2020-02-04 NOTE — Patient Outreach (Signed)
[facility]) Care Management  02/04/2020  Justin Walters 1940-04-09 [id number]   EMMI- General Discharge RED ON EMMI ALERT Day # 1 Date: 02/03/20 Red Alert Reason: Scheduled follow up? no  Outreach attempt: spoke with patient he reports doing good just tired.Discussed COVID recovery and taking it easy. He verbalized understanding.  Addressed red alert.  Patient states he has not called PCP for follow up yet but will be. Declined needing assistance to make appointment. Patient appreciative of call and declines any further needs for follow up.     Plan: RN CM will close case.    Jone Baseman, RN, MSN [facility] Care Management Care Management Coordinator Direct Line [phone] Toll Free: [phone]  Fax: [phone]

## 2020-02-09 ENCOUNTER — Inpatient Hospital Stay: Payer: Medicare HMO | Admitting: Internal Medicine

## 2020-02-23 DIAGNOSIS — J449 Chronic obstructive pulmonary disease, unspecified: Secondary | ICD-10-CM | POA: Diagnosis not present

## 2020-02-23 DIAGNOSIS — J9611 Chronic respiratory failure with hypoxia: Secondary | ICD-10-CM | POA: Diagnosis not present

## 2020-03-22 DIAGNOSIS — J449 Chronic obstructive pulmonary disease, unspecified: Secondary | ICD-10-CM | POA: Diagnosis not present

## 2020-03-22 DIAGNOSIS — J9611 Chronic respiratory failure with hypoxia: Secondary | ICD-10-CM | POA: Diagnosis not present

## 2020-04-01 ENCOUNTER — Other Ambulatory Visit: Payer: Self-pay | Admitting: Internal Medicine

## 2020-04-01 DIAGNOSIS — R918 Other nonspecific abnormal finding of lung field: Secondary | ICD-10-CM

## 2020-04-22 DIAGNOSIS — J9611 Chronic respiratory failure with hypoxia: Secondary | ICD-10-CM | POA: Diagnosis not present

## 2020-04-22 DIAGNOSIS — J449 Chronic obstructive pulmonary disease, unspecified: Secondary | ICD-10-CM | POA: Diagnosis not present

## 2020-05-03 ENCOUNTER — Telehealth: Payer: Self-pay | Admitting: Internal Medicine

## 2020-05-03 NOTE — Telephone Encounter (Signed)
Pt is calling in regards to oxygen and he stated that it was picked up back in September and he stated that it was picked up from the wrong company and he is wanting to know which company they called to pick it up. Pls regard; [phone]

## 2020-05-03 NOTE — Telephone Encounter (Signed)
Received call from [facility] with [employer], who stated that her director looked into this matter and it does not appear that [employer] has provided patient with oxygen previously. Melissa will contact [employer] and resolve this issue.  Patient is aware and voiced his understanding,  Nothing further is needed at this time.

## 2020-05-03 NOTE — Telephone Encounter (Signed)
Spoke to patient, who is questioning which company picked up his oxygen in 09/2019.  Patient is aware that per our records, order was placed to [employer]. Patient stated that [employer] did not provide him with oxygen, he originally got oxygen from [employer] and has a contract with them. He is questioning why the order was placed to [employer] and how he can go about getting this straightened out, as he currently has a contract with [employer].   I have spoken to [facility] with [employer], who stated that she is going to reach out to someone in the oxygen department, who will be able to better assist.  She will call back with update.

## 2020-05-14 DIAGNOSIS — I129 Hypertensive chronic kidney disease with stage 1 through stage 4 chronic kidney disease, or unspecified chronic kidney disease: Secondary | ICD-10-CM | POA: Diagnosis not present

## 2020-05-14 DIAGNOSIS — N200 Calculus of kidney: Secondary | ICD-10-CM | POA: Diagnosis not present

## 2020-05-14 DIAGNOSIS — N1832 Chronic kidney disease, stage 3b: Secondary | ICD-10-CM | POA: Diagnosis not present

## 2020-05-14 DIAGNOSIS — J449 Chronic obstructive pulmonary disease, unspecified: Secondary | ICD-10-CM | POA: Diagnosis not present

## 2020-05-22 DIAGNOSIS — L0390 Cellulitis, unspecified: Secondary | ICD-10-CM | POA: Diagnosis not present

## 2020-05-22 DIAGNOSIS — J449 Chronic obstructive pulmonary disease, unspecified: Secondary | ICD-10-CM | POA: Diagnosis not present

## 2020-05-22 DIAGNOSIS — N183 Chronic kidney disease, stage 3 unspecified: Secondary | ICD-10-CM | POA: Diagnosis not present

## 2020-05-22 DIAGNOSIS — J9611 Chronic respiratory failure with hypoxia: Secondary | ICD-10-CM | POA: Diagnosis not present

## 2020-05-25 DIAGNOSIS — N183 Chronic kidney disease, stage 3 unspecified: Secondary | ICD-10-CM | POA: Diagnosis not present

## 2020-05-25 DIAGNOSIS — R7303 Prediabetes: Secondary | ICD-10-CM | POA: Diagnosis not present

## 2020-05-25 DIAGNOSIS — L0390 Cellulitis, unspecified: Secondary | ICD-10-CM | POA: Diagnosis not present

## 2020-05-27 ENCOUNTER — Telehealth: Payer: Self-pay | Admitting: Internal Medicine

## 2020-05-27 ENCOUNTER — Other Ambulatory Visit: Payer: Self-pay | Admitting: Internal Medicine

## 2020-05-27 NOTE — Telephone Encounter (Signed)
I called and spoke with patient regarding oxygen. An order was sent to [employer] to d/c O2 and picked up buy the contract was through [employer]. Per last message on 05/03/20, Melissa at [employer] was aware and going to contact [place]. I called [employer] and spoke with [place] who stated they have not heard anything from [employer] and was not aware of the situation. I sent a community message to [facility] to reach out to [place] and please update [place]. I notified the patient and will reach out once we hear back. Patient verbalized understanding.

## 2020-06-03 ENCOUNTER — Encounter: Payer: Self-pay | Admitting: Internal Medicine

## 2020-06-03 ENCOUNTER — Ambulatory Visit (INDEPENDENT_AMBULATORY_CARE_PROVIDER_SITE_OTHER): Payer: Medicare HMO | Admitting: Internal Medicine

## 2020-06-03 ENCOUNTER — Other Ambulatory Visit: Payer: Self-pay

## 2020-06-03 VITALS — BP 120/74 | HR 83 | Temp 98.2°F | Ht 67.0 in | Wt 173.2 lb

## 2020-06-03 DIAGNOSIS — J449 Chronic obstructive pulmonary disease, unspecified: Secondary | ICD-10-CM | POA: Diagnosis not present

## 2020-06-03 DIAGNOSIS — M545 Low back pain, unspecified: Secondary | ICD-10-CM | POA: Insufficient documentation

## 2020-06-03 DIAGNOSIS — R7302 Impaired glucose tolerance (oral): Secondary | ICD-10-CM

## 2020-06-03 DIAGNOSIS — I10 Essential (primary) hypertension: Secondary | ICD-10-CM | POA: Diagnosis not present

## 2020-06-03 MED ORDER — CYCLOBENZAPRINE HCL 5 MG PO TABS: 5.0000 mg | ORAL_TABLET | Freq: Three times a day (TID) | ORAL | 1 refills | Status: DC | PRN

## 2020-06-03 NOTE — Assessment & Plan Note (Addendum)
Pain is c/w left lumbar msk spasm, for muscle relaxer prn, lumbar spine films, otc topical lidocaine, and  to f/u any worsening symptoms or concerns

## 2020-06-03 NOTE — Assessment & Plan Note (Signed)
BP Readings from Last 3 Encounters:  06/03/20 120/74  02/01/20 (!) 155/95  01/06/20 110/60   Stable, pt to continue medical treatment  - low salt diet, wt control

## 2020-06-03 NOTE — Progress Notes (Signed)
Patient ID: Justin Walters, male   DOB: 1940/06/14, 80 y.o.   MRN: [id number]        Chief Complaint: low back pain       HPI:  Justin Walters is a 80 y.o. male here with acute onset 10 days left lower back pain, tender and sore, moderate, intermitettent, sharp, worse to stand up or bend, nothing else seems to make better or worse, and Pt denies bowel or bladder change, fever, wt loss,  worsening LE pain/numbness/weakness, gait change or falls.  Pt denies chest pain, increased sob or doe, wheezing, orthopnea, PND, increased LE swelling, palpitations, dizziness or syncope.   Pt denies polydipsia, polyuria, or new focal neuro s/s.   Pt denies fever, wt loss, night sweats, loss of appetite, or other constitutional symptoms         Wt Readings from Last 3 Encounters:  06/03/20 173 lb 3.2 oz (78.6 kg)  01/29/20 179 lb 14.3 oz (81.6 kg)  01/06/20 180 lb (81.6 kg)   BP Readings from Last 3 Encounters:  06/03/20 120/74  02/01/20 (!) 155/95  01/06/20 110/60         Past Medical History:  Diagnosis Date  . At risk for sleep apnea    STOP-BANG= 5    SENT TO PCP 06-13-2013  . Bladder stone   . BPH (benign prostatic hyperplasia)   . COPD (chronic obstructive pulmonary disease) ([place])   . Diverticulosis of colon   . Dysuria   . Glucose intolerance (impaired glucose tolerance)   . History of kidney stones   . Hyperlipidemia   . Hypertension   . Ureteral disorder 2018   ureteral repair   Past Surgical History:  Procedure Laterality Date  . APPENDECTOMY  1980's  . CATARACT EXTRACTION W/ INTRAOCULAR LENS  IMPLANT, BILATERAL    . COLONOSCOPY  10-05-2003   tics only   . CYSTOSCOPY N/A 06/23/2013   Procedure: CYSTOSCOPY WITH LITHOLAPAXY, BLADDER EXPLORATION, REPAIR BLADDER PERFORATION;  Surgeon: Arvil Persons, MD;  Location: [facility];  Service: Urology;  Laterality: N/A;  . CYSTOSCOPY/URETEROSCOPY/HOLMIUM LASER/STENT PLACEMENT Left 04/03/2017   Procedure: CYSTOSCOPY LEFT RETROGRADE/LEFT  /URETEROSCOPY/HOLMIUM LASER/BASKET STONE EXTRACTION/STENT PLACEMENT/ URETHRAL BIOPSY;  Surgeon: [name], MD;  Location: [facility];  Service: Urology;  Laterality: Left;  . EXTRACORPOREAL SHOCK WAVE LITHOTRIPSY Left 10/23/2016   Procedure: LEFT EXTRACORPOREAL SHOCK WAVE LITHOTRIPSY (ESWL);  Surgeon: Nickie Retort, MD;  Location: [facility];  Service: Urology;  Laterality: Left;  . EXTRACORPOREAL SHOCK WAVE LITHOTRIPSY Left 01/08/2017   Procedure: LEFT EXTRACORPOREAL SHOCK WAVE LITHOTRIPSY (ESWL);  Surgeon: [name], MD;  Location: [facility];  Service: Urology;  Laterality: Left;  . HOLMIUM LASER APPLICATION N/A 0/99/8338   Procedure: HOLMIUM LASER APPLICATION;  Surgeon: Arvil Persons, MD;  Location[facility];  Service: Urology;  Laterality: N/A;  . LAPAROSCOPIC INGUINAL HERNIA REPAIR Bilateral 10-09-2003  . TRANSURETHRAL RESECTION OF PROSTATE N/A 06/23/2013   Procedure: TRANSURETHRAL RESECTION OF THE PROSTATE;  Surgeon: Arvil Persons, MD;  Location: [facility];  Service: Urology;  Laterality: N/A;    reports that he quit smoking about 27 years ago. His smoking use included cigarettes. He has a 102.00 pack-year smoking history. He has never used smokeless tobacco. He reports that he does not drink alcohol and does not use drugs. family history includes COPD in his mother; Cancer in his father; Emphysema in his brother; Heart failure in his brother. No Known Allergies Current Outpatient  Medications on File Prior to Visit  Medication Sig Dispense Refill  . acetaminophen (TYLENOL) 325 MG tablet Take 650 mg by mouth every 6 (six) hours as needed for mild pain or headache.    . albuterol (PROVENTIL HFA;VENTOLIN HFA) 108 (90 Base) MCG/ACT inhaler INHALE 2 PUFFS INTO THE LUNGS EVERY 6 (SIX) HOURS AS NEEDED FOR WHEEZING OR SHORTNESS OF BREATH. 54 g 3  . albuterol (PROVENTIL) (2.5 MG/3ML) 0.083% nebulizer solution Take 3 mLs (2.5 mg total) by nebulization every 4  (four) hours as needed for wheezing or shortness of breath. 120 mL 11  . aspirin 81 MG EC tablet Take 1 tablet (81 mg total) by mouth daily. Swallow whole. 30 tablet 12  . calcium-vitamin D (OSCAL WITH D) 500-200 MG-UNIT tablet Take 1 tablet by mouth. 50 mcg once a day    . cholecalciferol (VITAMIN D3) 25 MCG (1000 UNIT) tablet Take 2,000 Units by mouth daily.    . diphenhydrAMINE (BENADRYL) 25 MG tablet Take 50 mg by mouth every 6 (six) hours as needed for allergies.    Marland Kitchen doxylamine, Sleep, (UNISOM) 25 MG tablet Take 50 mg by mouth at bedtime as needed for sleep.    Marland Kitchen Fluticasone-Umeclidin-Vilant (TRELEGY ELLIPTA) 100-62.5-25 MCG/INH AEPB Inhale 1 puff into the lungs daily. 1 each 11  . gabapentin (NEURONTIN) 300 MG capsule TAKE 1 CAPSULE THREE TIMES DAILY (Patient taking differently: Take 300 mg by mouth 3 (three) times daily.) 270 capsule 2  . Guaifenesin 1200 MG TB12 Take 1 tablet by mouth 2 (two) times daily as needed (congestion).    Marland Kitchen guaiFENesin-dextromethorphan (ROBITUSSIN DM) 100-10 MG/5ML syrup Take 5 mLs by mouth every 6 (six) hours as needed for cough. 118 mL 0  . pantoprazole (PROTONIX) 40 MG tablet TAKE 1 TABLET BY MOUTH ONCE DAILY 30 TO 60  MINUTES  BEFORE  FIRST  MEAL  OF  THE  DAY 90 tablet 1  . predniSONE (DELTASONE) 10 MG tablet 2 tabs daily until better, then 1 tab daily until return to clinic (Patient not taking: Reported on 06/03/2020) 100 tablet 2  . Respiratory Therapy Supplies (FLUTTER) DEVI Use as directed 1 each 0  . rosuvastatin (CRESTOR) 20 MG tablet TAKE 1 TABLET EVERY DAY (Patient taking differently: Take 20 mg by mouth daily.) 90 tablet 2  . tamsulosin (FLOMAX) 0.4 MG CAPS capsule Take 0.4 mg by mouth daily.    . vitamin B-12 (CYANOCOBALAMIN) 1000 MCG tablet Take 1 tablet (1,000 mcg total) by mouth daily. 90 tablet 1   No current facility-administered medications on file prior to visit.        ROS:  All others reviewed and negative.  Objective        PE:  BP  120/74 (BP Location: Left Arm, Patient Position: Sitting, Cuff Size: Large)   Pulse 83   Temp 98.2 F (36.8 C) (Oral)   Ht 5\' 7"  (1.702 m)   Wt 173 lb 3.2 oz (78.6 kg)   SpO2 95%   BMI 27.13 kg/m                 Constitutional: Pt appears in NAD               HENT: Head: NCAT.                Right Ear: External ear normal.                 Left Ear: External ear normal.  Eyes: . Pupils are equal, round, and reactive to light. Conjunctivae and EOM are normal               Nose: without d/c or deformity               Neck: Neck supple. Gross normal ROM               Cardiovascular: Normal rate and regular rhythm.                 Pulmonary/Chest: Effort normal and breath sounds without rales or wheezing.                Abd:  Soft, NT, ND, + BS, no organomegaly; ls spine nontender, but has tender spasm left lumbar paravertebral               Neurological: Pt is alert. At baseline orientation, motor grossly intact               Skin: Skin is warm. No rashes, no other new lesions, LE edema - none               Psychiatric: Pt behavior is normal without agitation   Micro: none  Cardiac tracings I have personally interpreted today:  none  Pertinent Radiological findings (summarize): none   Lab Results  Component Value Date   WBC 11.9 (H) 01/29/2020   HGB 14.1 01/29/2020   HCT 45.3 01/29/2020   PLT 136 (L) 01/29/2020   GLUCOSE 143 (H) 02/01/2020   CHOL 126 12/31/2019   TRIG 126 01/29/2020   HDL 56.30 12/31/2019   LDLDIRECT 161.9 03/28/2012   LDLCALC 53 12/31/2019   ALT 16 02/01/2020   AST 15 02/01/2020   NA 136 02/01/2020   K 4.4 02/01/2020   CL 100 02/01/2020   CREATININE 1.39 (H) 02/01/2020   BUN 38 (H) 02/01/2020   CO2 25 02/01/2020   TSH 2.68 06/26/2019   PSA 2.59 06/26/2019   HGBA1C 6.2 12/31/2019   Assessment/Plan:  ALIN CHAVIRA is a 80 y.o. White or Caucasian [1] male with  has a past medical history of At risk for sleep apnea, Bladder stone, BPH  (benign prostatic hyperplasia), COPD (chronic obstructive pulmonary disease) ([place]), Diverticulosis of colon, Dysuria, Glucose intolerance (impaired glucose tolerance), History of kidney stones, Hyperlipidemia, Hypertension, and Ureteral disorder (2018).  Low back pain Pain is c/w left lumbar msk spasm, for muscle relaxer prn, lumbar spine films, otc topical lidocaine, and  to f/u any worsening symptoms or concerns  Impaired glucose tolerance Lab Results  Component Value Date   HGBA1C 6.2 12/31/2019   Stable, pt to continue current medical treatment  - diet   Essential hypertension BP Readings from Last 3 Encounters:  06/03/20 120/74  02/01/20 (!) 155/95  01/06/20 110/60   Stable, pt to continue medical treatment  - low salt diet, wt control   COPD GOLD III Stable, cont current med tx,  to f/u any worsening symptoms or concerns - trelegy, proventil prn   Followup: Return if symptoms worsen or fail to improve.  Cathlean Cower, MD 06/03/2020 9:21 PM [facility]

## 2020-06-03 NOTE — Assessment & Plan Note (Signed)
Lab Results  Component Value Date   HGBA1C 6.2 12/31/2019   Stable, pt to continue current medical treatment  - diet

## 2020-06-03 NOTE — Assessment & Plan Note (Signed)
Stable, cont current med tx,  to f/u any worsening symptoms or concerns - trelegy, proventil prn

## 2020-06-03 NOTE — Patient Instructions (Signed)
Please take all new medication as prescribed - the generic flexeril  Ok to also try the OTC pain patch called Salonpaz topically as needed  Please continue all other medications as before, and refills have been done if requested.  Please have the pharmacy call with any other refills you may need.  Please continue your efforts at being more active, low cholesterol diet, and weight control.  Please keep your appointments with your specialists as you may have planned  Please go to the XRAY Department in the first floor for the x-ray testing  You will be contacted by phone if any changes need to be made immediately.  Otherwise, you will receive a letter about your results with an explanation, but please check with MyChart first.  Please remember to sign up for MyChart if you have not done so, as this will be important to you in the future with finding out test results, communicating by private email, and scheduling acute appointments online when needed.  Marland Kitchen

## 2020-06-09 NOTE — Telephone Encounter (Signed)
Per phone note from 05/03/2020. Issues has been taken care of by [facility] with [facility]. Called and informed pt of this. Nothing further needed at this time.

## 2020-06-22 DIAGNOSIS — J9611 Chronic respiratory failure with hypoxia: Secondary | ICD-10-CM | POA: Diagnosis not present

## 2020-06-22 DIAGNOSIS — J449 Chronic obstructive pulmonary disease, unspecified: Secondary | ICD-10-CM | POA: Diagnosis not present

## 2020-06-29 ENCOUNTER — Other Ambulatory Visit: Payer: Self-pay | Admitting: Internal Medicine

## 2020-06-30 ENCOUNTER — Telehealth: Payer: Self-pay | Admitting: Internal Medicine

## 2020-06-30 ENCOUNTER — Other Ambulatory Visit (INDEPENDENT_AMBULATORY_CARE_PROVIDER_SITE_OTHER): Payer: Medicare HMO

## 2020-06-30 DIAGNOSIS — E7849 Other hyperlipidemia: Secondary | ICD-10-CM | POA: Diagnosis not present

## 2020-06-30 DIAGNOSIS — E538 Deficiency of other specified B group vitamins: Secondary | ICD-10-CM

## 2020-06-30 DIAGNOSIS — E559 Vitamin D deficiency, unspecified: Secondary | ICD-10-CM

## 2020-06-30 DIAGNOSIS — R972 Elevated prostate specific antigen [PSA]: Secondary | ICD-10-CM

## 2020-06-30 DIAGNOSIS — R7302 Impaired glucose tolerance (oral): Secondary | ICD-10-CM

## 2020-06-30 LAB — LIPID PANEL
Cholesterol: 132 mg/dL (ref 0–200)
HDL: 54 mg/dL (ref >39.00)
LDL Cholesterol: 51 mg/dL (ref 0–99)
NonHDL: 78.33
Total CHOL/HDL Ratio: 2
Triglycerides: 135 mg/dL (ref 0.0–149.0)
VLDL: 27 mg/dL (ref 0.0–40.0)

## 2020-06-30 LAB — HEPATIC FUNCTION PANEL
ALT: 12 U/L (ref 0–53)
AST: 14 U/L (ref 0–37)
Albumin: 4.4 g/dL (ref 3.5–5.2)
Alkaline Phosphatase: 53 U/L (ref 39–117)
Bilirubin, Direct: 0.1 mg/dL (ref 0.0–0.3)
Total Bilirubin: 0.6 mg/dL (ref 0.2–1.2)
Total Protein: 7.2 g/dL (ref 6.0–8.3)

## 2020-06-30 LAB — CBC WITH DIFFERENTIAL/PLATELET
Basophils Absolute: 0 K/uL (ref 0.0–0.1)
Basophils Relative: 0.3 % (ref 0.0–3.0)
Eosinophils Absolute: 0 K/uL (ref 0.0–0.7)
Eosinophils Relative: 0 % (ref 0.0–5.0)
HCT: 40.2 % (ref 39.0–52.0)
Hemoglobin: 13.2 g/dL (ref 13.0–17.0)
Lymphocytes Relative: 4.2 % — ABNORMAL LOW (ref 12.0–46.0)
Lymphs Abs: 0.6 K/uL — ABNORMAL LOW (ref 0.7–4.0)
MCHC: 32.9 g/dL (ref 30.0–36.0)
MCV: 84 fl (ref 78.0–100.0)
Monocytes Absolute: 0.4 K/uL (ref 0.1–1.0)
Monocytes Relative: 2.7 % — ABNORMAL LOW (ref 3.0–12.0)
Neutro Abs: 13.5 K/uL — ABNORMAL HIGH (ref 1.4–7.7)
Neutrophils Relative %: 92.8 % — ABNORMAL HIGH (ref 43.0–77.0)
Platelets: 204 K/uL (ref 150.0–400.0)
RBC: 4.78 Mil/uL (ref 4.22–5.81)
RDW: 13.8 % (ref 11.5–15.5)
WBC: 14.6 K/uL — ABNORMAL HIGH (ref 4.0–10.5)

## 2020-06-30 LAB — URINALYSIS, ROUTINE W REFLEX MICROSCOPIC
Bilirubin Urine: NEGATIVE
Hgb urine dipstick: NEGATIVE
Ketones, ur: NEGATIVE
Nitrite: POSITIVE — AB
Specific Gravity, Urine: 1.02 (ref 1.000–1.030)
Urine Glucose: NEGATIVE
Urobilinogen, UA: 0.2 (ref 0.0–1.0)
pH: 6 (ref 5.0–8.0)

## 2020-06-30 LAB — BASIC METABOLIC PANEL WITH GFR
BUN: 28 mg/dL — ABNORMAL HIGH (ref 6–23)
CO2: 24 meq/L (ref 19–32)
Calcium: 9.5 mg/dL (ref 8.4–10.5)
Chloride: 104 meq/L (ref 96–112)
Creatinine, Ser: 1.63 mg/dL — ABNORMAL HIGH (ref 0.40–1.50)
GFR: 39.86 mL/min — ABNORMAL LOW (ref >60.00)
Glucose, Bld: 135 mg/dL — ABNORMAL HIGH (ref 70–99)
Potassium: 4.1 meq/L (ref 3.5–5.1)
Sodium: 139 meq/L (ref 135–145)

## 2020-06-30 LAB — VITAMIN D 25 HYDROXY (VIT D DEFICIENCY, FRACTURES): VITD: 51.82 ng/mL (ref 30.00–100.00)

## 2020-06-30 LAB — PSA: PSA: 1.14 ng/mL (ref 0.10–4.00)

## 2020-06-30 LAB — TSH: TSH: 1.85 u[IU]/mL (ref 0.35–4.50)

## 2020-06-30 LAB — HEMOGLOBIN A1C: Hgb A1c MFr Bld: 6.1 % (ref 4.6–6.5)

## 2020-06-30 LAB — VITAMIN B12: Vitamin B-12: >1550 pg/mL — ABNORMAL HIGH (ref 211–911)

## 2020-06-30 NOTE — Telephone Encounter (Signed)
Patient notified that labs have been order.

## 2020-06-30 NOTE — Telephone Encounter (Signed)
Ok labs ordered 

## 2020-06-30 NOTE — Telephone Encounter (Signed)
Patient normally does his blood work and labs before his appointments, wants to know if Dr.John would still like for him to do that.   Please advise

## 2020-07-06 ENCOUNTER — Other Ambulatory Visit: Payer: Self-pay

## 2020-07-06 ENCOUNTER — Ambulatory Visit (INDEPENDENT_AMBULATORY_CARE_PROVIDER_SITE_OTHER): Payer: Medicare HMO | Admitting: Internal Medicine

## 2020-07-06 ENCOUNTER — Encounter: Payer: Self-pay | Admitting: Internal Medicine

## 2020-07-06 VITALS — BP 126/74 | HR 75 | Ht 67.0 in | Wt 171.0 lb

## 2020-07-06 DIAGNOSIS — N1831 Chronic kidney disease, stage 3a: Secondary | ICD-10-CM

## 2020-07-06 DIAGNOSIS — I10 Essential (primary) hypertension: Secondary | ICD-10-CM

## 2020-07-06 DIAGNOSIS — H900 Conductive hearing loss, bilateral: Secondary | ICD-10-CM | POA: Diagnosis not present

## 2020-07-06 DIAGNOSIS — R7302 Impaired glucose tolerance (oral): Secondary | ICD-10-CM | POA: Diagnosis not present

## 2020-07-06 DIAGNOSIS — E7800 Pure hypercholesterolemia, unspecified: Secondary | ICD-10-CM

## 2020-07-06 DIAGNOSIS — I700 Atherosclerosis of aorta: Secondary | ICD-10-CM | POA: Diagnosis not present

## 2020-07-06 DIAGNOSIS — E538 Deficiency of other specified B group vitamins: Secondary | ICD-10-CM

## 2020-07-06 DIAGNOSIS — D72829 Elevated white blood cell count, unspecified: Secondary | ICD-10-CM

## 2020-07-06 DIAGNOSIS — Z860100 Personal history of colon polyps, unspecified: Secondary | ICD-10-CM

## 2020-07-06 DIAGNOSIS — Z0000 Encounter for general adult medical examination without abnormal findings: Secondary | ICD-10-CM

## 2020-07-06 DIAGNOSIS — H6123 Impacted cerumen, bilateral: Secondary | ICD-10-CM

## 2020-07-06 DIAGNOSIS — N390 Urinary tract infection, site not specified: Secondary | ICD-10-CM

## 2020-07-06 DIAGNOSIS — E559 Vitamin D deficiency, unspecified: Secondary | ICD-10-CM

## 2020-07-06 NOTE — Patient Instructions (Addendum)
Your left arm appears back to healed today  Your ears were irrigated of wax today  You will be contacted regarding the referral for: colonoscopy for later this yr  Ok to back off on the B12 supplement to twice per wk  Please continue all other medications as before, and refills have been done if requested.  Please have the pharmacy call with any other refills you may need.  Please continue your efforts at being more active, low cholesterol diet, and weight control.  You are otherwise up to date with prevention measures today.  Please keep your appointments with your specialists as you may have planned  Please make an Appointment to return in 6 months, or sooner if needed, also with Lab Appointment for testing done 3-5 days before at the [facility] (so this is for TWO appointments - please see the scheduling desk as you leave)  Due to the ongoing Covid 19 pandemic, our lab now requires an appointment for any labs done at our office.  If you need labs done and do not have an appointment, please call our office ahead of time to schedule before presenting to the lab for your testing.

## 2020-07-06 NOTE — Progress Notes (Signed)
Patient ID: Justin Walters, male   DOB: 08/29/1940, 80 y.o.   MRN: [id number]        Chief Complaint: follow up HTN, HLD and hyperglycemia, recent arm cellulitis, low b12, ckd, leukocytosis, chronic uti, bilateral hearing loss       HPI:  Justin Walters is a 80 y.o. male here with c/o bilateral hearing loss x 1 wk, mild to mod, constant, nothing makes better or worse.  Pt denies chest pain, increased sob or doe, wheezing, orthopnea, PND, increased LE swelling, palpitations, dizziness or syncope.   Pt denies polydipsia, polyuria, or new focal neuro s/s.  Pt denies fever, wt loss, night sweats, loss of appetite, or other constitutional symptoms  Taking b12 and Vit D   Denies urinary symptoms such as dysuria, frequency, urgency, flank pain, hematuria or n/v, fever, chills.  Recent left arm cellulits tx at [facility] now resolved.         Wt Readings from Last 3 Encounters:  07/08/20 170 lb 9.6 oz (77.4 kg)  07/06/20 171 lb (77.6 kg)  06/03/20 173 lb 3.2 oz (78.6 kg)   BP Readings from Last 3 Encounters:  07/08/20 114/62  07/06/20 126/74  06/03/20 120/74         Past Medical History:  Diagnosis Date   At risk for sleep apnea    STOP-BANG= 5    SENT TO PCP 06-13-2013   Bladder stone    BPH (benign prostatic hyperplasia)    COPD (chronic obstructive pulmonary disease) ([facility])    Diverticulosis of colon    Dysuria    Glucose intolerance (impaired glucose tolerance)    History of kidney stones    Hyperlipidemia    Hypertension    Ureteral disorder 2018   ureteral repair   Past Surgical History:  Procedure Laterality Date   APPENDECTOMY  1980's   CATARACT EXTRACTION W/ INTRAOCULAR LENS  IMPLANT, BILATERAL     COLONOSCOPY  10-05-2003   tics only    CYSTOSCOPY N/A 06/23/2013   Procedure: CYSTOSCOPY WITH LITHOLAPAXY, BLADDER EXPLORATION, REPAIR BLADDER PERFORATION;  Surgeon: Arvil Persons, MD;  Location: [facility];  Service: Urology;  Laterality: N/A;   CYSTOSCOPY/URETEROSCOPY/HOLMIUM  LASER/STENT PLACEMENT Left 04/03/2017   Procedure: CYSTOSCOPY LEFT RETROGRADE/LEFT /URETEROSCOPY/HOLMIUM LASER/BASKET STONE EXTRACTION/STENT PLACEMENT/ URETHRAL BIOPSY;  Surgeon: [name], MD;  Location: [facility];  Service: Urology;  Laterality: Left;   EXTRACORPOREAL SHOCK WAVE LITHOTRIPSY Left 10/23/2016   Procedure: LEFT EXTRACORPOREAL SHOCK WAVE LITHOTRIPSY (ESWL);  Surgeon: Nickie Retort, MD;  Location: [facility];  Service: Urology;  Laterality: Left;   EXTRACORPOREAL SHOCK WAVE LITHOTRIPSY Left 01/08/2017   Procedure: LEFT EXTRACORPOREAL SHOCK WAVE LITHOTRIPSY (ESWL);  Surgeon: [name], MD;  Location: [facility];  Service: Urology;  Laterality: Left;   HOLMIUM LASER APPLICATION N/A 5/42/7062   Procedure: HOLMIUM LASER APPLICATION;  Surgeon: Arvil Persons, MD;  Location: [facility];  Service: Urology;  Laterality: N/A;   LAPAROSCOPIC INGUINAL HERNIA REPAIR Bilateral 10-09-2003   TRANSURETHRAL RESECTION OF PROSTATE N/A 06/23/2013   Procedure: TRANSURETHRAL RESECTION OF THE PROSTATE;  Surgeon: Arvil Persons, MD;  Location: [facility];  Service: Urology;  Laterality: N/A;    reports that he quit smoking about 27 years ago. His smoking use included cigarettes. He has a 102.00 pack-year smoking history. He has never used smokeless tobacco. He reports that he does not drink alcohol and does not use drugs. family history includes COPD in his mother; Cancer in  his father; Emphysema in his brother; Heart failure in his brother. No Known Allergies Current Outpatient Medications on File Prior to Visit  Medication Sig Dispense Refill   acetaminophen (TYLENOL) 325 MG tablet Take 650 mg by mouth every 6 (six) hours as needed for mild pain or headache.     albuterol (PROVENTIL HFA;VENTOLIN HFA) 108 (90 Base) MCG/ACT inhaler INHALE 2 PUFFS INTO THE LUNGS EVERY 6 (SIX) HOURS AS NEEDED FOR WHEEZING OR SHORTNESS OF BREATH. 54 g 3   aspirin 81 MG EC tablet Take 1 tablet (81  mg total) by mouth daily. Swallow whole. 30 tablet 12   cholecalciferol (VITAMIN D3) 25 MCG (1000 UNIT) tablet Take 2,000 Units by mouth daily.     diphenhydrAMINE (BENADRYL) 25 MG tablet Take 50 mg by mouth every 6 (six) hours as needed for allergies.     Fluticasone-Umeclidin-Vilant (TRELEGY ELLIPTA) 100-62.5-25 MCG/INH AEPB Inhale 1 puff into the lungs daily. 1 each 11   Guaifenesin 1200 MG TB12 Take 1 tablet by mouth 2 (two) times daily as needed (congestion).     pantoprazole (PROTONIX) 40 MG tablet TAKE 1 TABLET BY MOUTH ONCE DAILY 30 TO 60  MINUTES  BEFORE  FIRST  MEAL  OF  THE  DAY 90 tablet 1   predniSONE (DELTASONE) 10 MG tablet 2 tabs daily until better, then 1 tab daily until return to clinic (Patient not taking: Reported on 07/08/2020) 100 tablet 2   Respiratory Therapy Supplies (FLUTTER) DEVI Use as directed 1 each 0   rosuvastatin (CRESTOR) 20 MG tablet TAKE 1 TABLET EVERY DAY 90 tablet 0   tamsulosin (FLOMAX) 0.4 MG CAPS capsule Take 0.4 mg by mouth daily.     vitamin B-12 (CYANOCOBALAMIN) 1000 MCG tablet Take 1 tablet (1,000 mcg total) by mouth daily. 90 tablet 1   No current facility-administered medications on file prior to visit.        ROS:  All others reviewed and negative.  Objective        PE:  BP 126/74 (BP Location: Left Arm, Patient Position: Sitting, Cuff Size: Large)   Pulse 75   Ht 5\' 7"  (1.702 m)   Wt 171 lb (77.6 kg)   SpO2 96%   BMI 26.78 kg/m                 Constitutional: Pt appears in NAD               HENT: Head: NCAT.                Right Ear: External ear normal.                 Left Ear: External ear normal.  Bilateral wax impactions resolved with irrigation               Eyes: . Pupils are equal, round, and reactive to light. Conjunctivae and EOM are normal               Nose: without d/c or deformity               Neck: Neck supple. Gross normal ROM               Cardiovascular: Normal rate and regular rhythm.                 Pulmonary/Chest:  Effort normal and breath sounds without rales or wheezing.  Abd:  Soft, NT, ND, + BS, no organomegaly               Neurological: Pt is alert. At baseline orientation, motor grossly intact               Skin: Skin is warm. No rashes, no other new lesions, LE edema - none               Psychiatric: Pt behavior is normal without agitation   Micro: none  Cardiac tracings I have personally interpreted today:  none  Pertinent Radiological findings (summarize): none   Lab Results  Component Value Date   WBC 14.6 (H) 06/30/2020   HGB 13.2 06/30/2020   HCT 40.2 06/30/2020   PLT 204.0 06/30/2020   GLUCOSE 135 (H) 06/30/2020   CHOL 132 06/30/2020   TRIG 135.0 06/30/2020   HDL 54.00 06/30/2020   LDLDIRECT 161.9 03/28/2012   LDLCALC 51 06/30/2020   ALT 12 06/30/2020   AST 14 06/30/2020   NA 139 06/30/2020   K 4.1 06/30/2020   CL 104 06/30/2020   CREATININE 1.63 (H) 06/30/2020   BUN 28 (H) 06/30/2020   CO2 24 06/30/2020   TSH 1.85 06/30/2020   PSA 1.14 06/30/2020   HGBA1C 6.1 06/30/2020   Assessment/Plan:  Justin Walters is a 80 y.o. White or Caucasian [1] male with  has a past medical history of At risk for sleep apnea, Bladder stone, BPH (benign prostatic hyperplasia), COPD (chronic obstructive pulmonary disease) ([place]), Diverticulosis of colon, Dysuria, Glucose intolerance (impaired glucose tolerance), History of kidney stones, Hyperlipidemia, Hypertension, and Ureteral disorder (2018).  Vitamin D deficiency Last vitamin D Lab Results  Component Value Date   VD25OH 51.82 06/30/2020   Stable, cont oral replacement   Vitamin B12 deficiency Lab Results  Component Value Date   VITAMINB12 >1550 (H) 06/30/2020   Overcontrolled, to decreased oral replacement - b12 1000 mcg to 2 days per wk  UTI (urinary tract infection) Chronic asympt, cont no tx for now  Impaired glucose tolerance Lab Results  Component Value Date   HGBA1C 6.1 06/30/2020   Stable, pt to  continue current medical treatment  - diet   Hyperlipidemia Lab Results  Component Value Date   LDLCALC 51 06/30/2020   Stable, pt to continue current statin crestor 20   CKD (chronic kidney disease) stage 3, GFR 30-59 ml/min ([facility]CC) Lab Results  Component Value Date   CREATININE 1.63 (H) 06/30/2020   Stable overall, cont to avoid nephrotoxins   Aortic atherosclerosis ([facility]) Pt to continue crestor, low chol diet, excercise  Bilateral hearing loss With recurrent wax impactions, s/p irrigation now improved  Patient agreed to ear lavage due to bilateral cerumen impaction; Ear lavage completed with no difficulty, impaction resolved, TM's look normal; she will continue using OTC kits regularly for prevention  Leukocytosis Chronic maybe slightly worsening, declines heme referral for now, f.u lab next visit  Followup: Return in about 6 months (around 01/06/2021).  Cathlean Cower, MD 07/08/2020 9:24 PM [facility]

## 2020-07-08 ENCOUNTER — Ambulatory Visit: Payer: Medicare HMO | Admitting: Internal Medicine

## 2020-07-08 ENCOUNTER — Other Ambulatory Visit: Payer: Self-pay

## 2020-07-08 ENCOUNTER — Encounter: Payer: Self-pay | Admitting: Internal Medicine

## 2020-07-08 DIAGNOSIS — J449 Chronic obstructive pulmonary disease, unspecified: Secondary | ICD-10-CM | POA: Diagnosis not present

## 2020-07-08 DIAGNOSIS — D72829 Elevated white blood cell count, unspecified: Secondary | ICD-10-CM | POA: Insufficient documentation

## 2020-07-08 NOTE — Assessment & Plan Note (Addendum)
Chronic maybe slightly worsening, declines heme referral for now, f.u lab next visit

## 2020-07-08 NOTE — Progress Notes (Signed)
Subjective:     Patient ID: Justin Walters, male   DOB: April 25, 1940,     MRN: [id number]    Brief patient profile:  24 yowm MM quit smoking 1995 with cough/wheeze/ sob and never recovered with documented GOLD III copd 02/23/2017      History of Present Illness  01/25/2017 1st  office visit/ Justin Walters   GOLD III with reversibility  Chief Complaint  Patient presents with   Pulmonary Consult    Referred by the hospital. He c/o SOB and coughing for the past several wks. His cough is occ prod, but he is unsure of sputum color. He is SOB with exertion and also when he lies down.  He gets winded walking approx 100 yards. He is using his albuterol inhaler 3 x per wk on average.   baseline doe across the street to son's house  nl pace and then onset   x one year gradually worse doe and no better on inhalers but poor baseline hfa/ assoc with cough / sense of chest congestion and has to prop up at 30 degrees now at hs to get comfortable Present doe = MMRC3 = can't walk 100 yards even at a slow pace at a flat grade s stopping due to sob  Easily confused with details of care/ names of meds / not using spiriva/ was better on pred and worse since tapered rec Plan A = Automatic =  symbicort 160 Take 2 puffs first thing in am and then another 2 puffs about 12 hours later.  Work on inhaler technique:  Plan B = Backup Only use your albuterol  as a rescue  Prednisone 10 mg take  4 each am x 2 days,   2 each am x 2 days,  1 each am x 2 days and stop     02/23/2017  f/u ov/Justin Walters re:  GOLD III copd / no 02 Chief Complaint  Patient presents with   Follow-up    PFT's today.  Breathing has improved some. He is rarely using his albuterol inhaler. He has been feeling fatigued.    better p prednisone then gradually worse  Dyspnea:  MMRC2 = can't walk a nl pace on a flat grade s sob but does fine slow and flat  Cough: none Sleep: ok 30 degrees no am congestion rec Add tudorza one puff after symbicort in am and in pm  Work  on inhaler technique:      05/24/2017  f/u ov/Justin Walters re:  Copd III/ no 02  Just on maint symb 160 / could not afford tudorza and not sure it helped doe  Chief Complaint  Patient presents with   Follow-up    Breathing is doing well. He rarely uses his albuterol inhaler. No new co's.   Dyspnea:  MMRC2 = can't walk a nl pace on a flat grade s sob but does fine slow pace pretty much anywhere he wants to go  Cough: none Sleep: ok flat SABA use:  Rare  Hb every hs x sev months / resolves p about an hour, better p sit up / no assoc dysphagia or daytime symptoms/ varies some with how early he eats supper and how much, not sure what type meals make it worse  rec GERD diet  If not better add pepcid ac 20 mg one hour before bed > did not  Do   10/31/17 NP eval for 3-4 weeks worse sob/ cough rec Xopenex in office  Depo 80 Prednisone 10mg  tablet  >>>  4 tabs for 2 days, then 3 tabs for 2 days, 2 tabs for 2 days, then 1 tab for 2 days, then sto Azithromycin 250mg  tablet  >>>Take 2 tablets (500mg  total) today, and then 1 tablet (250mg ) for the next four days      11/21/2017 acute extended ov/Justin Walters re: refractory cough x early oct 2019  / no change sob  Chief Complaint  Patient presents with   Acute Visit    pt c/o sinus congestion, pnd, prod cough with white mucus X1 month.  has had 2 rounds of pred and doxy with little relief.   dyspnea : MMRC2 = can't walk a nl pace on a flat grade s sob but does fine slow and flat= baseline when not coughing  Cough > white mucus  not worse in am's/ harsh daytime coughing fts Sleeping flat two pillow saba  Not at all now rec Plan A = Automatic = symbicort 160 Take 2 puffs first thing in am and then another 2 puffs about 12 hours later.  Whenever your cough flares > try  Try prilosec otc 20mg   Take 30-60 min before first meal of the day and Pepcid ac (famotidine) 20 mg one after hour after supper or at bedtime until cough is completely gone for at least a week  without the need for cough suppression Plan B = Backup Only use your albuterol inhaler as a rescue medication  goes up over your usual need - Don't leave home without it !!  (think of it like the spare tire for your car)  For cough Mucinex dm 1200 mg every 12 hours and the tylenol #3  Up to every 4 hours if need for persistent irritating coughing  GERD diet      12/05/2017 acute extended ov/Justin Walters re: cough and sob worse since oc 2019 Chief Complaint  Patient presents with   Acute Visit    Increased SOB, cough and chest congestion x 1 month. Sputum is white to yellow. He is using his albuterol inhaler 2 x per wk on average.   cough is worse p supper and fine on back with  flat bed/ no blocks  In am feels fine but groggy p benadryl - can't  sleep s it W/in 20 min of rising lots of pnds > white mucus  Not using mucinex dm / reversing ppi and h2  Technique on symb poor and not using saba correctly in terms or when to use  rec Augmentin 875 mg take one pill twice daily  X 10 days - take at breakfast and supper with large glass of water.  .  Take prednisone  4 for three days 3 for three days 2 for three days 1 for three days and stop  Omeprazole 20 mg Take 30- 60 min before your first and last meals of the day and continue famotidine 20 mg at bedtime Bed blocks would be a great idea Try off zquil and tylenol #3  Up to 2 at bedtime For cough > mucinex dm 1200 mg every 12 hours and use the flutter valve as much as possible and if still can't stop coughing then use the tylenol #3        12/20/2017  f/u ov/Justin Walters re:  Cough better, doe not quite baseline on symb 160 2bid  No worse off pred Chief Complaint  Patient presents with   Follow-up    Breathing has improved some but is not back to his normal baseline. He rarely uses  his albuterol inhaler.   Dyspnea:  MMRC2 = can't walk a nl pace on a flat grade s sob but does fine slow and flat  Cough: better/ no mucus production at all  Sleeping: ok  flat  SABA use: rarely 02: none   rec Bed blocks 6-8 inches  For cough > Mucinex dm up to 1200 mg twice daily and use the flutter valve as much as you can Gabapentin should be three times daily  Omeprazole is Take 30-60 min before first meal of the day and pepcid ac 20 mg after supper     01/25/2018 acute extended ov/Justin Walters re:  Copd III / stopped symb too expensive, does better with nebs / overall not doing well since early Oct 2019  Chief Complaint  Patient presents with   Acute Visit    SOB, runny nose, ear pain, wheezing, cough- white mucus at times, denies fever   brought meds/ not using ppi as instructed and ? If really using flutter, doesn't think it helps but did not bring it as rec to show him how to use/ using halls cough drops with menthol which was discouraged last ov/ gradually worse doe/ subj wheeze x sev weeks no better with hfa but technique poor/ no purulent mucus/ cough worse at hs and in am. No better on prednisone  rec Duoneb (albuterol and atrovent)  4 x daily Only use your albuterol as a rescue medication   Work on inhaler technique:  Be sure you take prilosec Take 30- 60 min before your first and last meals of the day  Change your appt to  - add sinus ct / hrct chest if still coughing on return      02/22/2018  f/u ov/Justin Walters re: COPD  GOLD III maint on duoneb tid  Chief Complaint  Patient presents with   Follow-up    Increased SOB, wheezing, and cough- non over the past 3-4 wks. He is using his albuterol inhaler 3-4 x per day and neb 3 x per day.   Dyspnea:  Still does [employer] with hc parking no 02  Cough: p supper is the worst but never prod Sleeping: bed blocks/ wakes up tight in chest around 2 am p last neb at 7 pm  SABA use: as above  rec Plan A = Automatic = Symbicort 160 Take 2 puffs first thing in am and then another 2 puffs about 12 hours later and duoneb four times   Work on inhaler technique:   Plan B = Backup Only use your albuterol inhaler as a  rescue medication Prednisone 10 mg  Take 4 for three days 3 for three days 2 for three days 1 for three days and stop  Please see patient coordinator before you leave today  to schedule 02 2lpm 24/7 for now Please remember to go to the  x-ray department  for your tests - we will call you with the results when they are available   Please schedule a follow up office visit in 2 weeks, sooner if needed  with all medications /inhalers/ solutions in hand so we can verify exactly what you are taking. This includes all medications from all doctors and over the counters and bring your DRUG FORMULARY  - consider change coreg to bisoprolol or at least bystolic samples short run  - add levaquin 500 mg x 10 d for ? pna    03/08/2018  f/u ov/Justin Walters re:  COPD  III  On symb and duoneb qid  Chief Complaint  Patient presents with   Follow-up    Breathing has improved back to baseline. He is not using his albuterol inhaler but is using his duoneb 4 x daily.   Dyspnea:  Better, usually does [employer] leaning on cart s 02  Cough: none now Sleeping: bed blocks  SABA use: none extra in hfa form  02: 2lpm at hs not with activity  rec Plan A = Automatic = Incruse/BREO  one click eam am and continue 02 2 lpm at bedtime and wear it  as needed during the day to keep your sats > 90% with walking or go slower Plan B = Backup Only use your albuterol inhaler as a rescue medication  Plan C = Crisis - only use your albuterol nebulizer(not your ipatropium)  if you first try Plan B and it fails to help > ok to use the nebulizer up to every 4 hours but if start needing it regularly call for immediate appointment Please remember to go to the  x-ray department  for your tests - we will call you with the results when they are available   Please schedule a follow up office visit in 2 weeks with your formulary , sooner if needed  with all medications /inhalers/ solutions in hand so we can verify exactly what you are taking. This  includes all medications from all doctors and over the counters (ok to see NP if I don't have opening )    03/25/2018  f/u ov/Justin Walters re:  COPD III overall worse since Oct 2019, esp since Feb 02 2018 Chief Complaint  Patient presents with   Follow-up    Breathing has been progressively worse since the last visit. He is using his rescue inhaler daily and neb 4 x daily.   Dyspnea:  Across the room on 3lpm  Cough: turned green x sev days prior to OV / sometimes coughs so hard hurts in chest (ant/ generalized)  but not using flutter as rec   Sleeping: bed blocks does fine most nocts SABA use: as above  02:  3lpm 24/7  rec Change BREO / Incruse to Anoro click x one and take two good drags (hold with the R hand) Prednisone 10 mg take  4 each am x 2 days,   2 each am x 2 days,  1 each am x 2 days and stop  Augmentin 875 mg take one pill twice daily  X 10 days -  Plan B = Backup Only use your albuterol inhaler as a rescue medication Plan C = Crisis - only use your albuterol nebulizer(NOT your ipatropium/albuterol)  if you first try Plan B Add protonix 40 mg Take 30-60 min before first meal of the day  The goal with 02 is to keep your 02 saturations over 90% at all times        06/11/2019  f/u ov/Justin Walters re: copd III  2nd shot early may 2021 / maint trelegy with pred as plan D  Chief Complaint  Patient presents with   Follow-up  Dyspnea:  Still walking to son's house 300 ft  flat s stopping on RA Cough: worse since  6/7 non productive but rattling > some  better since started  prednisone Sleeping: 6 in blocks SABA use: none > did not try rechallenging 02: not using / not checking  rec Make sure you check your oxygen saturations at highest level of activity   For cough /congestion/rattling >  mucinex 1200 mg every 12  hours as needed with flutter as much as possible         01/05/2020  f/u ov/Justin Walters re: GOLD 3 on advair and prednisone 10 mg always worse on 5 mg with cough/ congestion Chief  Complaint  Patient presents with   Follow-up    COPD  Dyspnea:  still walking to son's house = MMRC2 = can't walk a nl pace on a flat grade s sob but does fine slow  Not as fast walk as on trelegy  Cough: sensation of pnds min white / clears throat a lot daytime on max gerd rx and gabapenitn 300 tid  Sleeping: 6 in blocks/ 2 pillows  SABA use: none / not using neb  02: none- sent it back Rec Plan A = Automatic = Always=    trelegy one click am  Then rinse and gargle  Arm and hammer toothpaste may be helpful  If doing better try prednisone 10 mg alternating with 5 mg on odd/even dates Plan B = Backup (to supplement plan A, not to replace it) Only use your albuterol inhaler as a rescue medication    Make sure you check your oxygen saturation  at your highest level of activity  to be sure it stays over 90%      07/08/2020  f/u ov/Justin Walters re: GOLD III  maint on trelgy when can afford it, otherwise uses wixella 250 with pred 20 mg daily prn flare  Chief Complaint  Patient presents with   Follow-up    Breathing is overall doing well. He states he rarely uses his albuterol inhaler.    Dyspnea:  still walking to son's 100 yards flat s stopping  Cough: minimal / no purulent sputum  Sleeping: 6in blocks/ no resp cc SABA use: very rare saba hfa and doesn't have/ need neb saba 02: none Covid status:   vax x 3 plus had omicron likely  When flares it's usually noct wheeze / cough and takes 2-3 days of pred 20 then stops and better for weeks to months   No obvious day to day or daytime variability or assoc excess/ purulent sputum or mucus plugs or hemoptysis or cp or chest tightness, subjective wheeze or overt sinus or hb symptoms.   Sleeping  without nocturnal  or early am exacerbation  of respiratory  c/o's or need for noct saba. Also denies any obvious fluctuation of symptoms with weather or environmental changes or other aggravating or alleviating factors except as outlined above   No unusual  exposure hx or h/o childhood pna/ asthma or knowledge of premature birth.  Current Allergies, Complete Past Medical History, Past Surgical History, Family History, and Social History were reviewed in [employer] record.  ROS  The following are not active complaints unless bolded Hoarseness, sore throat, dysphagia, dental problems, itching, sneezing,  nasal congestion or discharge of excess mucus or purulent secretions, ear ache,   fever, chills, sweats, unintended wt loss or wt gain, classically pleuritic or exertional cp,  orthopnea pnd or arm/hand swelling  or leg swelling, presyncope, palpitations, abdominal pain, anorexia, nausea, vomiting, diarrhea  or change in bowel habits or change in bladder habits, change in stools or change in urine, dysuria, hematuria,  rash, arthralgias, visual complaints, headache, numbness, weakness or ataxia or problems with walking or coordination,  change in mood or  memory.        Current Meds  Medication Sig   acetaminophen (TYLENOL) 325 MG tablet Take 650 mg by mouth every  6 (six) hours as needed for mild pain or headache.   albuterol (PROVENTIL HFA;VENTOLIN HFA) 108 (90 Base) MCG/ACT inhaler INHALE 2 PUFFS INTO THE LUNGS EVERY 6 (SIX) HOURS AS NEEDED FOR WHEEZING OR SHORTNESS OF BREATH.   aspirin 81 MG EC tablet Take 1 tablet (81 mg total) by mouth daily. Swallow whole.   cholecalciferol (VITAMIN D3) 25 MCG (1000 UNIT) tablet Take 2,000 Units by mouth daily.   diphenhydrAMINE (BENADRYL) 25 MG tablet Take 50 mg by mouth every 6 (six) hours as needed for allergies.   Fluticasone-Umeclidin-Vilant (TRELEGY ELLIPTA) 100-62.5-25 MCG/INH AEPB Inhale 1 puff into the lungs daily.   Guaifenesin 1200 MG TB12 Take 1 tablet by mouth 2 (two) times daily as needed (congestion).   pantoprazole (PROTONIX) 40 MG tablet TAKE 1 TABLET BY MOUTH ONCE DAILY 30 TO 60  MINUTES  BEFORE  FIRST  MEAL  OF  THE  DAY   Respiratory Therapy Supplies (FLUTTER) DEVI Use  as directed   rosuvastatin (CRESTOR) 20 MG tablet TAKE 1 TABLET EVERY DAY   tamsulosin (FLOMAX) 0.4 MG CAPS capsule Take 0.4 mg by mouth daily.   vitamin B-12 (CYANOCOBALAMIN) 1000 MCG tablet Take 1 tablet (1,000 mcg total) by mouth daily.                Objective:   Physical Exam   07/08/2020          170  01/05/2020         179 09/30/2019        176  06/11/2019          180  03/10/2019          178 09/10/2018          174  06/06/2018          171  03/25/2018       155  03/08/2018         149   01/25/2018       156  12/05/2017       157  11/21/2017     157  05/24/2017        164    01/25/17 150 lb 12.8 oz (68.4 kg)  01/18/17 150 lb (68 kg)  01/08/17 150 lb (68 kg)      Vital signs reviewed  07/08/2020  - Note at rest 02 sats  97% on RA   General appearance:    pleasant amb wm, min rattling cough    HEENT : pt wearing mask not removed for exam due to covid -19 concerns.    NECK :  without JVD/Nodes/TM/ nl carotid upstrokes bilaterally   LUNGS: no acc muscle use,  Mod barrel  contour chest wall with bilateral  Distant bs s audible wheeze and  without cough on insp or exp maneuvers and mod  Hyperresonant  to  percussion bilaterally    CV:  RRR  no s3 or murmur or increase in P2, and no edema   ABD:  soft and nontender with pos mid insp Hoover's  in the supine position. No bruits or organomegaly appreciated, bowel sounds nl  MS:     ext warm without deformities, calf tenderness, cyanosis or clubbing No obvious joint restrictions   SKIN: warm and dry without lesions    NEURO:  alert, approp, nl sensorium with  no motor or cerebellar deficits apparent.                  Assessment:

## 2020-07-08 NOTE — Assessment & Plan Note (Signed)
Pt to continue crestor, low chol diet, excercise

## 2020-07-08 NOTE — Addendum Note (Signed)
Addended by: Biagio Borg on: 07/08/2020 09:27 PM   Modules accepted: Orders

## 2020-07-08 NOTE — Patient Instructions (Signed)
No change in recs  Rememeber wixella is twice daily if can't afford trelegy   Use arm and hammer toothaste after the inhaler and make a slurry with it /gargle   Please schedule a follow up visit in 6  months but call sooner if needed

## 2020-07-08 NOTE — Assessment & Plan Note (Signed)
Lab Results  Component Value Date   CREATININE 1.63 (H) 06/30/2020   Stable overall, cont to avoid nephrotoxins

## 2020-07-08 NOTE — Assessment & Plan Note (Signed)
Chronic asympt, cont no tx for now

## 2020-07-08 NOTE — Assessment & Plan Note (Signed)
Lab Results  Component Value Date   LDLCALC 51 06/30/2020   Stable, pt to continue current statin crestor 20

## 2020-07-08 NOTE — Assessment & Plan Note (Addendum)
Quit smoking 1995/MM PFT's  01/20/2017  FEV1 1.05 (39 % ) ratio 54  p 18 % improvement from saba p ? prior to study with DLCO  50/53  % corrects to 77 % for alv volume  - 01/25/2017   rec increase symb to 160 2bid trial  - alpha one AT screening  01/18/17   MM  Level 190 PFT's  02/23/2017  FEV1 1.17 (44 % ) ratio 54  p 0 % improvement from saba p symb 160 prior to study with DLCO   39 % corrects to 59 % for alv volume  - 02/23/2017    add tudorza trial > no change and could not afford - 12/05/2017 flutter valve added   - 02/22/2018    > restart symb 160 2bid - 03/08/2018  After extensive coaching inhaler device,  effectiveness =    90% with elipta so try Anoro daily > improved as of 06/06/2018  - 12/05/2018 reported on televisit much worse everytime stops pred while on anoro so rec change to trelegy and pred 20 ceiling/ 5 mg floor until seen  - 12/20/2018 improved but did not start trelegy and taking anoro at hs > rec trelegy q am and stop anoro, reduce pred to floor of 5mg  qod > titrated complete off as of 02/2019 and gradually worse by ov 03/10/2019  - 03/10/2019 rec Prednisone as Plan D = 20 mg until better then taper off if tol   - 09/30/2019 reports lowest dose of prednisone 10 mg o/w achy/cough/nause  (since 03/2019)  - 09/30/2019  After extensive coaching inhaler device,  effectiveness =    90%  -  09/30/2019   Walked RA  2 laps @ approx 23ft each @ slow pace  stopped due to end of study, tured > sob and sats still 96% - 09/30/2019 rec wixella 250 to save over trelegy until out of donut hole    - 01/05/2020 back on trelegy100 if can afford on plan as worse doe on wixella  - 07/08/2020  After extensive coaching inhaler device,  effectiveness =    90% with dpi, 80% with hfa   Group D in terms of symptom/risk and laba/lama/ICS  therefore appropriate rx at this point >>>  trelegy 100 when can afford it, otherwise do 200 sample or wixella  Re saba: I spent extra time with pt today reviewing appropriate use of  albuterol for prn use on exertion with the following points: 1) saba is for relief of sob that does not improve by walking a slower pace or resting but rather if the pt does not improve after trying this first. 2) If the pt is convinced, as many are, that saba helps recover from activity faster then it's easy to tell if this is the case by re-challenging : ie stop, take the inhaler, then p 5 minutes try the exact same activity (intensity of workload) that just caused the symptoms and see if they are substantially diminished or not after saba 3) if there is an activity that reproducibly causes the symptoms, try the saba 15 min before the activity on alternate days   If in fact the saba really does help, then fine to continue to use it prn but advised may need to look closer at the maintenance regimen being used to achieve better control of airways disease with exertion.           Each maintenance medication was reviewed in detail including emphasizing most importantly the difference between  maintenance and prns and under what circumstances the prns are to be triggered using an action plan format where appropriate.  Total time for H and P, chart review, counseling, reviewing hfa/dpi device(s) and generating customized AVS unique to this office visit / same day charting = 25 min

## 2020-07-08 NOTE — Assessment & Plan Note (Signed)
With recurrent wax impactions, s/p irrigation now improved  Patient agreed to ear lavage due to bilateral cerumen impaction; Ear lavage completed with no difficulty, impaction resolved, TM's look normal; she will continue using OTC kits regularly for prevention

## 2020-07-08 NOTE — Assessment & Plan Note (Signed)
Lab Results  Component Value Date   HGBA1C 6.1 06/30/2020   Stable, pt to continue current medical treatment  - diet

## 2020-07-08 NOTE — Assessment & Plan Note (Signed)
Last vitamin D Lab Results  Component Value Date   VD25OH 51.82 06/30/2020   Stable, cont oral replacement

## 2020-07-08 NOTE — Assessment & Plan Note (Addendum)
Lab Results  Component Value Date   VITAMINB12 >1550 (H) 06/30/2020   Overcontrolled, to decreased oral replacement - b12 1000 mcg to 2 days per wk

## 2020-07-22 DIAGNOSIS — J9611 Chronic respiratory failure with hypoxia: Secondary | ICD-10-CM | POA: Diagnosis not present

## 2020-07-22 DIAGNOSIS — J449 Chronic obstructive pulmonary disease, unspecified: Secondary | ICD-10-CM | POA: Diagnosis not present

## 2020-07-26 ENCOUNTER — Other Ambulatory Visit: Payer: Self-pay | Admitting: Internal Medicine

## 2020-07-30 ENCOUNTER — Other Ambulatory Visit: Payer: Self-pay | Admitting: Internal Medicine

## 2020-08-18 ENCOUNTER — Other Ambulatory Visit: Payer: Self-pay | Admitting: Internal Medicine

## 2020-08-20 DIAGNOSIS — H2513 Age-related nuclear cataract, bilateral: Secondary | ICD-10-CM | POA: Diagnosis not present

## 2020-08-22 DIAGNOSIS — J9611 Chronic respiratory failure with hypoxia: Secondary | ICD-10-CM | POA: Diagnosis not present

## 2020-08-22 DIAGNOSIS — J449 Chronic obstructive pulmonary disease, unspecified: Secondary | ICD-10-CM | POA: Diagnosis not present

## 2020-08-23 DIAGNOSIS — H524 Presbyopia: Secondary | ICD-10-CM | POA: Diagnosis not present

## 2020-08-23 DIAGNOSIS — H52223 Regular astigmatism, bilateral: Secondary | ICD-10-CM | POA: Diagnosis not present

## 2020-09-09 ENCOUNTER — Telehealth: Payer: Self-pay | Admitting: Internal Medicine

## 2020-09-10 NOTE — Telephone Encounter (Signed)
LMTCB with patient

## 2020-09-14 NOTE — Telephone Encounter (Signed)
Called patient but he did not answer. Left message for him to call back.  

## 2020-09-14 NOTE — Telephone Encounter (Signed)
Called and spoke with patient. He stated that the wrong O2 company picked up his O2. He is not sure of the company who did pick up the supplies but they needed to go to [place].   I reviewed his chart and the order was placed on 09/30/19 and sent to [facility]. I called [employer] twice and received a busy tone each time.   Called [employer] and spoke with [place]. She stated that she did see in the records that his O2 was picked in October 2021.   Will need to reach out to [employer] to see what is going on with his account.

## 2020-09-17 NOTE — Telephone Encounter (Signed)
Attempted to contact [employer]2, phone line rings twice then rings busy.

## 2020-09-23 NOTE — Telephone Encounter (Signed)
Per pt's chart there are phone notes from May 2022 that showed this issue was resolved with the help of Melissa with [employer].   Lmtcb for Melissa for more help.

## 2020-09-30 NOTE — Telephone Encounter (Signed)
Message sent to [place] with [employer] to look into this.

## 2020-10-07 NOTE — Telephone Encounter (Signed)
Justin Walters [place], LPN  New, Justin Walters; Justin Walters, Justin Pho, RN; Justin Walters; Justin Walters  Correct we are aware you all picked up the oxygen however we do not have records stating that the patient was receiving oxygen with you all. It appears he only received a nebulizer with [facility]. However from our standpoint it appears he was getting his oxygen from [place].   So just for clarification you Walters are saying he was receiving oxygen with you Walters? The oxygen was picked up because we sent the order however the order was sent to you Walters by accident, it should have gone to [place].   Thanks :)   Additional message sent to [employer]. Will await f/u.

## 2020-10-07 NOTE — Telephone Encounter (Signed)
New, Ila Mcgill, RN; Eugenia Pancoast It looks like we picked up the o2 per MD request and the patient has no billing due from [place] at this time? I / we can not speak to [place] billing him but this is what I see on this from our end.   Danielle / Mardene Celeste am I missing anything?

## 2020-10-08 ENCOUNTER — Ambulatory Visit (INDEPENDENT_AMBULATORY_CARE_PROVIDER_SITE_OTHER): Payer: Medicare HMO

## 2020-10-08 ENCOUNTER — Other Ambulatory Visit: Payer: Self-pay

## 2020-10-08 ENCOUNTER — Encounter: Payer: Self-pay | Admitting: Internal Medicine

## 2020-10-08 VITALS — BP 126/70 | HR 85 | Temp 98.1°F | Resp 16 | Ht 67.0 in | Wt 175.6 lb

## 2020-10-08 DIAGNOSIS — Z23 Encounter for immunization: Secondary | ICD-10-CM | POA: Diagnosis not present

## 2020-10-08 DIAGNOSIS — Z0000 Encounter for general adult medical examination without abnormal findings: Secondary | ICD-10-CM | POA: Diagnosis not present

## 2020-10-08 NOTE — Progress Notes (Addendum)
Subjective:   Justin Walters is a 80 y.o. male who presents for Medicare Annual/Subsequent preventive examination.  Review of Systems     Cardiac Risk Factors include: advanced age (>45men, >10 women);dyslipidemia;family history of premature cardiovascular disease;hypertension;male gender     Objective:    Today's Vitals   10/08/20 1541  BP: 126/70  Pulse: 85  Resp: 16  Temp: 98.1 F (36.7 C)  SpO2: 95%  Weight: 175 lb 9.6 oz (79.7 kg)  Height: 5\' 7"  (1.702 m)  PainSc: 0-No pain   Body mass index is 27.5 kg/m.  Advanced Directives 10/08/2020 01/29/2020 07/03/2019 03/26/2018 03/30/2017 01/08/2017 01/05/2017  Does Patient Have a Medical Advance Directive? No No No No No Yes No  Type of Advance Directive - - - - - Press photographer;Living will -  Does patient want to make changes to medical advance directive? - - - - - No - Patient declined -  Copy of [place] in Chart? - - - - - No - copy requested No - copy requested  Would patient like information on creating a medical advance directive? No - Patient declined No - Patient declined No - Patient declined No - Patient declined No - Patient declined No - Patient declined -    Current Medications (verified) Outpatient Encounter Medications as of 10/08/2020  Medication Sig   acetaminophen (TYLENOL) 325 MG tablet Take 650 mg by mouth every 6 (six) hours as needed for mild pain or headache.   albuterol (PROVENTIL HFA;VENTOLIN HFA) 108 (90 Base) MCG/ACT inhaler INHALE 2 PUFFS INTO THE LUNGS EVERY 6 (SIX) HOURS AS NEEDED FOR WHEEZING OR SHORTNESS OF BREATH.   aspirin 81 MG EC tablet Take 1 tablet (81 mg total) by mouth daily. Swallow whole.   cholecalciferol (VITAMIN D3) 25 MCG (1000 UNIT) tablet Take 2,000 Units by mouth daily.   cyclobenzaprine (FLEXERIL) 5 MG tablet Take 1 tablet by mouth three times daily as needed for muscle spasm   diphenhydrAMINE (BENADRYL) 25 MG tablet Take 50 mg by mouth every 6 (six)  hours as needed for allergies.   Fluticasone-Umeclidin-Vilant (TRELEGY ELLIPTA) 100-62.5-25 MCG/INH AEPB Inhale 1 puff into the lungs daily.   Guaifenesin 1200 MG TB12 Take 1 tablet by mouth 2 (two) times daily as needed (congestion).   pantoprazole (PROTONIX) 40 MG tablet TAKE 1 TABLET BY MOUTH ONCE DAILY 30 TO 60  MINUTES  BEFORE  FIRST  MEAL  OF  THE  DAY   predniSONE (DELTASONE) 10 MG tablet 2 tabs daily until better, then 1 tab daily until return to clinic (Patient not taking: Reported on 07/08/2020)   Respiratory Therapy Supplies (FLUTTER) DEVI Use as directed   rosuvastatin (CRESTOR) 20 MG tablet TAKE 1 TABLET EVERY DAY   tamsulosin (FLOMAX) 0.4 MG CAPS capsule Take 0.4 mg by mouth daily.   vitamin B-12 (CYANOCOBALAMIN) 1000 MCG tablet Take 1 tablet (1,000 mcg total) by mouth daily.   No facility-administered encounter medications on file as of 10/08/2020.    Allergies (verified) Patient has no known allergies.   History: Past Medical History:  Diagnosis Date   At risk for sleep apnea    STOP-BANG= 5    SENT TO PCP 06-13-2013   Bladder stone    BPH (benign prostatic hyperplasia)    COPD (chronic obstructive pulmonary disease) (HCC)    Diverticulosis of colon    Dysuria    Glucose intolerance (impaired glucose tolerance)    History of kidney stones  Hyperlipidemia    Hypertension    Ureteral disorder 2018   ureteral repair   Past Surgical History:  Procedure Laterality Date   APPENDECTOMY  1980's   CATARACT EXTRACTION W/ INTRAOCULAR LENS  IMPLANT, BILATERAL     COLONOSCOPY  10-05-2003   tics only    CYSTOSCOPY N/A 06/23/2013   Procedure: CYSTOSCOPY WITH LITHOLAPAXY, BLADDER EXPLORATION, REPAIR BLADDER PERFORATION;  Surgeon: Arvil Persons, MD;  Location: [place];  Service: Urology;  Laterality: N/A;   CYSTOSCOPY/URETEROSCOPY/HOLMIUM LASER/STENT PLACEMENT Left 04/03/2017   Procedure: CYSTOSCOPY LEFT RETROGRADE/LEFT /URETEROSCOPY/HOLMIUM LASER/BASKET STONE  EXTRACTION/STENT PLACEMENT/ URETHRAL BIOPSY;  Surgeon: [name], MD;  Location: [facility];  Service: Urology;  Laterality: Left;   EXTRACORPOREAL SHOCK WAVE LITHOTRIPSY Left 10/23/2016   Procedure: LEFT EXTRACORPOREAL SHOCK WAVE LITHOTRIPSY (ESWL);  Surgeon: Nickie Retort, MD;  Location: [facility];  Service: Urology;  Laterality: Left;   EXTRACORPOREAL SHOCK WAVE LITHOTRIPSY Left 01/08/2017   Procedure: LEFT EXTRACORPOREAL SHOCK WAVE LITHOTRIPSY (ESWL);  Surgeon: [name], MD;  Location: [facility];  Service: Urology;  Laterality: Left;   HOLMIUM LASER APPLICATION N/A 5/36/1443   Procedure: HOLMIUM LASER APPLICATION;  Surgeon: Arvil Persons, MD;  Location: [facility];  Service: Urology;  Laterality: N/A;   LAPAROSCOPIC INGUINAL HERNIA REPAIR Bilateral 10-09-2003   TRANSURETHRAL RESECTION OF PROSTATE N/A 06/23/2013   Procedure: TRANSURETHRAL RESECTION OF THE PROSTATE;  Surgeon: Arvil Persons, MD;  Location: [facility];  Service: Urology;  Laterality: N/A;   Family History  Problem Relation Age of Onset   COPD Mother    Cancer Father        unknown   Emphysema Brother    Heart failure Brother    Colon cancer Neg Hx    Esophageal cancer Neg Hx    Rectal cancer Neg Hx    Stomach cancer Neg Hx    Social History   Socioeconomic History   Marital status: Widowed    Spouse name: Not on file   Number of children: 3   Years of education: Not on file   Highest education level: GED or equivalent  Occupational History   Occupation: Retired Event [employer])  Tobacco Use   Smoking status: Former    Packs/day: 3.00    Years: 34.00    Pack years: 102.00    Types: Cigarettes    Quit date: 01/02/1993    Years since quitting: 27.7   Smokeless tobacco: Never  Vaping Use   Vaping Use: Never used  Substance and Sexual Activity   Alcohol use: No    Alcohol/week: 4.0 standard drinks    Types: 4 Standard drinks or equivalent per week   Drug  use: No   Sexual activity: Not Currently    Partners: Female  Other Topics Concern   Not on file  Social History Narrative   Not on file   Social Determinants of Health   Financial Resource Strain: Low Risk    Difficulty of Paying Living Expenses: Not hard at all  Food Insecurity: No Food Insecurity   Worried About Charity fundraiser in the Last Year: Never true   [place] in the Last Year: Never true  Transportation Needs: No Transportation Needs   Lack of Transportation (Medical): No   Lack of Transportation (Non-Medical): No  Physical Activity: Sufficiently Active   Days of Exercise per Week: 5 days   Minutes of Exercise per Session: 30 min  Stress: No Stress Concern Present   Feeling of Stress : Not at all  Social Connections: Moderately Integrated   Frequency of Communication with Friends and Family: More than three times a week   Frequency of Social Gatherings with Friends and Family: More than three times a week   Attends Religious Services: More than 4 times per year   Active Member of [employer] or Organizations: Yes   Attends Archivist Meetings: More than 4 times per year   Marital Status: Widowed    Tobacco Counseling Counseling given: Not Answered   Clinical Intake:  Pre-visit preparation completed: Yes  Pain : No/denies pain Pain Score: 0-No pain     BMI - recorded: 27.5 Nutritional Status: BMI 25 -29 Overweight Nutritional Risks: None Diabetes: No  How often do you need to have someone help you when you read instructions, pamphlets, or other written materials from your doctor or pharmacy?: 1 - Never What is the last grade level you completed in school?: GED; Retired from [employer]  Diabetic? no  Interpreter Needed?: No  Information entered by :: Lisette Abu, LPN   Activities of Daily Living In your present state of health, do you have any difficulty performing the following activities: 10/08/2020 01/29/2020  Hearing? N N   Vision? N N  Difficulty concentrating or making decisions? N N  Walking or climbing stairs? N Y  Comment - secondary to shortness of breath  Dressing or bathing? N N  Doing errands, shopping? N N  Preparing Food and eating ? N -  Using the Toilet? N -  In the past six months, have you accidently leaked urine? N -  Do you have problems with loss of bowel control? N -  Managing your Medications? N -  Managing your Finances? N -  Housekeeping or managing your Housekeeping? N -  Some recent data might be hidden    Patient Care Team: Biagio Borg, MD as PCP - General [name], MD as Consulting Physician (Urology) Odis Luster, Huey Bienenstock, MD as Referring Physician (Urology)  Indicate any recent Medical Services you may have received from other than [facility] providers in the past year (date may be approximate).     Assessment:   This is a routine wellness examination for [place].  Hearing/Vision screen No results found.  Dietary issues and exercise activities discussed: Current Exercise Habits: Home exercise routine, Type of exercise: walking;Other - see comments (yard work, gardening), Time (Minutes): 30, Frequency (Times/Week): 5, Weekly Exercise (Minutes/Week): 150, Intensity: Moderate, Exercise limited by: respiratory conditions(s)   Goals Addressed   None   Depression Screen PHQ 2/9 Scores 10/08/2020 07/06/2020 01/06/2020 07/03/2019 07/03/2019 01/30/2019 07/30/2018  PHQ - 2 Score 0 0 0 0 0 0 0  PHQ- 9 Score - - - - - - -    Fall Risk Fall Risk  10/08/2020 07/06/2020 01/06/2020 07/03/2019 07/03/2019  Falls in the past year? 0 0 0 0 0  Number falls in past yr: 0 0 - - 0  Injury with Fall? 0 0 - - 0  Risk for fall due to : No Fall Risks - - - No Fall Risks  Risk for fall due to: Comment - - - - -  Follow up Falls evaluation completed - - - Falls evaluation completed    FALL RISK PREVENTION PERTAINING TO THE HOME:  Any stairs in or around the home? No  If so, are there any without  handrails? No  Home free of loose throw  rugs in walkways, pet beds, electrical cords, etc? Yes  Adequate lighting in your home to reduce risk of falls? Yes   ASSISTIVE DEVICES UTILIZED TO PREVENT FALLS:  Life alert? No  Use of a cane, walker or w/c? No  Grab bars in the bathroom? No  Shower chair or bench in shower? No  Elevated toilet seat or a handicapped toilet? No   TIMED UP AND GO:  Was the test performed? Yes .  Length of time to ambulate 10 feet: 7 sec.   Gait steady and fast without use of assistive device  Cognitive Function: Normal cognitive status assessed by direct observation by this Nurse Health Advisor. No abnormalities found.   MMSE - Mini Mental State Exam 01/04/2017  Orientation to time 5  Orientation to Place 5  Registration 3  Attention/ Calculation 5  Recall 1  Language- name 2 objects 2  Language- repeat 1  Language- follow 3 step command 3  Language- read & follow direction 1  Write a sentence 1  Copy design 1  Total score 28     6CIT Screen 07/03/2019  What Year? 0 points  What month? 0 points  What time? 0 points  Count back from 20 0 points  Months in reverse 0 points  Repeat phrase 0 points  Total Score 0    Immunizations Immunization History  Administered Date(s) Administered   Influenza Whole 11/30/2006, 10/03/2007, 12/11/2008, 10/13/2009   Influenza, High Dose Seasonal PF 12/30/2015, 11/15/2016, 10/01/2017   Influenza,inj,Quad PF,6+ Mos 11/17/2014   Influenza-Unspecified 10/24/2019   PFIZER(Purple Top)SARS-COV-2 Vaccination 03/09/2019, 04/08/2019, 10/24/2019   Pneumococcal Conjugate-13 05/01/2013   Pneumococcal Polysaccharide-23 10/03/2007   Td 10/03/2007   Tdap 07/30/2018   Zoster Recombinat (Shingrix) 07/25/2017, 12/03/2017   Zoster, Live 10/03/2007    TDAP status: Up to date  Flu Vaccine status: Completed at today's visit  Pneumococcal vaccine status: Up to date  Covid-19 vaccine status: Completed vaccines  Qualifies  for Shingles Vaccine? Yes   Zostavax completed Yes   Shingrix Completed?: Yes  Screening Tests Health Maintenance  Topic Date Due   COVID-19 Vaccine (4 - Booster for Pfizer series) 01/16/2020   INFLUENZA VACCINE  08/02/2020   COLONOSCOPY (Pts 45-73yrs Insurance coverage will need to be confirmed)  12/04/2020   TETANUS/TDAP  07/29/2028   Hepatitis C Screening  Completed   Zoster Vaccines- Shingrix  Completed   HPV VACCINES  Aged Out    Health Maintenance  Health Maintenance Due  Topic Date Due   COVID-19 Vaccine (4 - Booster for Pfizer series) 01/16/2020   INFLUENZA VACCINE  08/02/2020    Colorectal cancer screening: Type of screening: Colonoscopy. Completed 12/04/2013. Repeat every 7 years  Lung Cancer Screening: (Low Dose CT Chest recommended if Age 29-80 years, 30 pack-year currently smoking OR have quit w/in 15years.) does not qualify.   Lung Cancer Screening Referral: no  Additional Screening:  Hepatitis C Screening: does qualify; Completed yes  Vision Screening: Recommended annual ophthalmology exams for early detection of glaucoma and other disorders of the eye. Is the patient up to date with their annual eye exam?  Yes  Who is the provider or what is the name of the office in which the patient attends annual eye exams? Madilyn Hook, OD. If pt is not established with a provider, would they like to be referred to a provider to establish care? No .   Dental Screening: Recommended annual dental exams for proper oral hygiene  Community Resource Referral / Chronic  Care Management: CRR required this visit?  No   CCM required this visit?  No      Plan:     I have personally reviewed and noted the following in the patient's chart:   Medical and social history Use of alcohol, tobacco or illicit drugs  Current medications and supplements including opioid prescriptions. Patient is not currently taking opioid prescriptions. Functional ability and status Nutritional  status Physical activity Advanced directives List of other physicians Hospitalizations, surgeries, and ER visits in previous 12 months Vitals Screenings to include cognitive, depression, and falls Referrals and appointments  In addition, I have reviewed and discussed with patient certain preventive protocols, quality metrics, and best practice recommendations. A written personalized care plan for preventive services as well as general preventive health recommendations were provided to patient.     Sheral Flow, LPN   30/01/3141   Nurse Notes:  No results found.    Medical screening examination/treatment/procedure(s) were performed by non-physician practitioner and as supervising physician I was immediately available for consultation/collaboration.  I agree with above. Cathlean Cower, MD

## 2020-10-08 NOTE — Patient Instructions (Signed)
Justin Walters , Thank you for taking time to come for your Medicare Wellness Visit. I appreciate your ongoing commitment to your health goals. Please review the following plan we discussed and let me know if I can assist you in the future.   Screening recommendations/referrals: Colonoscopy: last done 12/04/2013; due every 7 years Recommended yearly ophthalmology/optometry visit for glaucoma screening and checkup Recommended yearly dental visit for hygiene and checkup  Vaccinations: Influenza vaccine: 10/08/2020 Pneumococcal vaccine: 10/03/2007, 05/01/2013 Tdap vaccine: 07/30/2018; due every 10 years Shingles vaccine: 07/25/2017, 12/03/2017   Covid-19: 03/09/2019, 04/08/2019, 10/24/2019  Advanced directives: Advance directive discussed with you today. Even though you declined this today please call our office should you change your mind and we can give you the proper paperwork for you to fill out.  Conditions/risks identified: Yes; Client understands the importance of follow-up with providers by attending scheduled visits and discussed goals to eat healthier, increase physical activity, exercise the brain, socialize more, get enough sleep and make time for laughter.  Next appointment: Please schedule your next Medicare Wellness Visit with your Nurse Health Advisor in 1 year by calling [phone].  Preventive Care 50 Years and Older, Male Preventive care refers to lifestyle choices and visits with your health care provider that can promote health and wellness. What does preventive care include? A yearly physical exam. This is also called an annual well check. Dental exams once or twice a year. Routine eye exams. Ask your health care provider how often you should have your eyes checked. Personal lifestyle choices, including: Daily care of your teeth and gums. Regular physical activity. Eating a healthy diet. Avoiding tobacco and drug use. Limiting alcohol use. Practicing safe sex. Taking low doses  of aspirin every day. Taking vitamin and mineral supplements as recommended by your health care provider. What happens during an annual well check? The services and screenings done by your health care provider during your annual well check will depend on your age, overall health, lifestyle risk factors, and family history of disease. Counseling  Your health care provider may ask you questions about your: Alcohol use. Tobacco use. Drug use. Emotional well-being. Home and relationship well-being. Sexual activity. Eating habits. History of falls. Memory and ability to understand (cognition). Work and work Statistician. Screening  You may have the following tests or measurements: Height, weight, and BMI. Blood pressure. Lipid and cholesterol levels. These may be checked every 5 years, or more frequently if you are over 22 years old. Skin check. Lung cancer screening. You may have this screening every year starting at age 37 if you have a 30-pack-year history of smoking and currently smoke or have quit within the past 15 years. Fecal occult blood test (FOBT) of the stool. You may have this test every year starting at age 82. Flexible sigmoidoscopy or colonoscopy. You may have a sigmoidoscopy every 5 years or a colonoscopy every 10 years starting at age 99. Prostate cancer screening. Recommendations will vary depending on your family history and other risks. Hepatitis C blood test. Hepatitis B blood test. Sexually transmitted disease (STD) testing. Diabetes screening. This is done by checking your blood sugar (glucose) after you have not eaten for a while (fasting). You may have this done every 1-3 years. Abdominal aortic aneurysm (AAA) screening. You may need this if you are a current or former smoker. Osteoporosis. You may be screened starting at age 77 if you are at high risk. Talk with your health care provider about your test results, treatment options,  and if necessary, the need for  more tests. Vaccines  Your health care provider may recommend certain vaccines, such as: Influenza vaccine. This is recommended every year. Tetanus, diphtheria, and acellular pertussis (Tdap, Td) vaccine. You may need a Td booster every 10 years. Zoster vaccine. You may need this after age 55. Pneumococcal 13-valent conjugate (PCV13) vaccine. One dose is recommended after age 57. Pneumococcal polysaccharide (PPSV23) vaccine. One dose is recommended after age 17. Talk to your health care provider about which screenings and vaccines you need and how often you need them. This information is not intended to replace advice given to you by your health care provider. Make sure you discuss any questions you have with your health care provider. Document Released: 01/15/2015 Document Revised: 09/08/2015 Document Reviewed: 10/20/2014 Elsevier Interactive Patient Education  2017 [place] Prevention in the Home Falls can cause injuries. They can happen to people of all ages. There are many things you can do to make your home safe and to help prevent falls. What can I do on the outside of my home? Regularly fix the edges of walkways and driveways and fix any cracks. Remove anything that might make you trip as you walk through a door, such as a raised step or threshold. Trim any bushes or trees on the path to your home. Use bright outdoor lighting. Clear any walking paths of anything that might make someone trip, such as rocks or tools. Regularly check to see if handrails are loose or broken. Make sure that both sides of any steps have handrails. Any raised decks and porches should have guardrails on the edges. Have any leaves, snow, or ice cleared regularly. Use sand or salt on walking paths during winter. Clean up any spills in your garage right away. This includes oil or grease spills. What can I do in the bathroom? Use night lights. Install grab bars by the toilet and in the tub and  shower. Do not use towel bars as grab bars. Use non-skid mats or decals in the tub or shower. If you need to sit down in the shower, use a plastic, non-slip stool. Keep the floor dry. Clean up any water that spills on the floor as soon as it happens. Remove soap buildup in the tub or shower regularly. Attach bath mats securely with double-sided non-slip rug tape. Do not have throw rugs and other things on the floor that can make you trip. What can I do in the bedroom? Use night lights. Make sure that you have a light by your bed that is easy to reach. Do not use any sheets or blankets that are too big for your bed. They should not hang down onto the floor. Have a firm chair that has side arms. You can use this for support while you get dressed. Do not have throw rugs and other things on the floor that can make you trip. What can I do in the kitchen? Clean up any spills right away. Avoid walking on wet floors. Keep items that you use a lot in easy-to-reach places. If you need to reach something above you, use a strong step stool that has a grab bar. Keep electrical cords out of the way. Do not use floor polish or wax that makes floors slippery. If you must use wax, use non-skid floor wax. Do not have throw rugs and other things on the floor that can make you trip. What can I do with my stairs? Do not leave  any items on the stairs. Make sure that there are handrails on both sides of the stairs and use them. Fix handrails that are broken or loose. Make sure that handrails are as long as the stairways. Check any carpeting to make sure that it is firmly attached to the stairs. Fix any carpet that is loose or worn. Avoid having throw rugs at the top or bottom of the stairs. If you do have throw rugs, attach them to the floor with carpet tape. Make sure that you have a light switch at the top of the stairs and the bottom of the stairs. If you do not have them, ask someone to add them for  you. What else can I do to help prevent falls? Wear shoes that: Do not have high heels. Have rubber bottoms. Are comfortable and fit you well. Are closed at the toe. Do not wear sandals. If you use a stepladder: Make sure that it is fully opened. Do not climb a closed stepladder. Make sure that both sides of the stepladder are locked into place. Ask someone to hold it for you, if possible. Clearly mark and make sure that you can see: Any grab bars or handrails. First and last steps. Where the edge of each step is. Use tools that help you move around (mobility aids) if they are needed. These include: Canes. Walkers. Scooters. Crutches. Turn on the lights when you go into a dark area. Replace any light bulbs as soon as they burn out. Set up your furniture so you have a clear path. Avoid moving your furniture around. If any of your floors are uneven, fix them. If there are any pets around you, be aware of where they are. Review your medicines with your doctor. Some medicines can make you feel dizzy. This can increase your chance of falling. Ask your doctor what other things that you can do to help prevent falls. This information is not intended to replace advice given to you by your health care provider. Make sure you discuss any questions you have with your health care provider. Document Released: 10/15/2008 Document Revised: 05/27/2015 Document Reviewed: 01/23/2014 Elsevier Interactive Patient Education  2017 [employer].

## 2020-10-14 ENCOUNTER — Other Ambulatory Visit: Payer: Self-pay | Admitting: Internal Medicine

## 2020-10-14 DIAGNOSIS — R918 Other nonspecific abnormal finding of lung field: Secondary | ICD-10-CM

## 2020-10-19 NOTE — Telephone Encounter (Signed)
Called and spoke to [place] with [employer]. Melissa states the Regional Freight forwarder with [employer] has tried to get the serial numbers from [place] to locate the tanks at [place]. There hasnt been any return contact from [place]. Melissa states she will reach out to [employer]'s manager to get an update and call us back.

## 2020-10-25 ENCOUNTER — Other Ambulatory Visit: Payer: Self-pay | Admitting: Internal Medicine

## 2020-11-09 ENCOUNTER — Encounter: Payer: Self-pay | Admitting: Internal Medicine

## 2020-11-09 NOTE — Telephone Encounter (Signed)
Email sent to [facility] asking for update.

## 2020-11-10 MED ORDER — GABAPENTIN 300 MG PO CAPS: 300.0000 mg | ORAL_CAPSULE | Freq: Three times a day (TID) | ORAL | 1 refills | Status: DC

## 2020-11-23 ENCOUNTER — Encounter: Payer: Self-pay | Admitting: Gastroenterology

## 2020-11-23 NOTE — Telephone Encounter (Signed)
Justin Walters, please advise if you ever received an update from [place].

## 2020-11-30 ENCOUNTER — Other Ambulatory Visit: Payer: Self-pay | Admitting: Internal Medicine

## 2020-11-30 NOTE — Telephone Encounter (Signed)
Please refill as per office routine med refill policy (all routine meds to be refilled for 3 mo or monthly (per pt preference) up to one year from last visit, then month to month grace period for 3 mo, then further med refills will have to be denied) ? ?

## 2020-11-30 NOTE — Telephone Encounter (Signed)
Called and spoke to pt. Pt states all has been taken care of. Called and spoke to [facility] with [employer] and she confirmed this has been taken care of and "made right by Medicare". Melissa denies anything further being needed. Pt aware to call back if there are any other issues. Will close encounter.

## 2020-12-06 ENCOUNTER — Encounter: Payer: Self-pay | Admitting: Primary Care

## 2020-12-06 ENCOUNTER — Ambulatory Visit: Payer: Medicare HMO | Admitting: Primary Care

## 2020-12-06 ENCOUNTER — Other Ambulatory Visit: Payer: Self-pay

## 2020-12-06 VITALS — BP 132/76 | HR 98 | Temp 97.7°F | Ht 67.0 in | Wt 171.4 lb

## 2020-12-06 DIAGNOSIS — J449 Chronic obstructive pulmonary disease, unspecified: Secondary | ICD-10-CM

## 2020-12-06 DIAGNOSIS — J441 Chronic obstructive pulmonary disease with (acute) exacerbation: Secondary | ICD-10-CM | POA: Diagnosis not present

## 2020-12-06 MED ORDER — DOXYCYCLINE HYCLATE 100 MG PO TABS: 100.0000 mg | ORAL_TABLET | Freq: Two times a day (BID) | ORAL | 0 refills | Status: DC

## 2020-12-06 MED ORDER — PREDNISONE 10 MG PO TABS: ORAL_TABLET | ORAL | 0 refills | Status: DC

## 2020-12-06 MED ORDER — METHYLPREDNISOLONE ACETATE 80 MG/ML IJ SUSP
80.0000 mg | Freq: Once | INTRAMUSCULAR | Status: AC
  Administered 2020-12-06: 80 mg via INTRAMUSCULAR

## 2020-12-06 MED ORDER — TRELEGY ELLIPTA 100-62.5-25 MCG/ACT IN AEPB
1.0000 | INHALATION_SPRAY | Freq: Once | RESPIRATORY_TRACT | 0 refills | Status: AC
Start: 2020-12-06 — End: 2020-12-06

## 2020-12-06 NOTE — Progress Notes (Signed)
@Patient  ID: Justin Walters, male    DOB: 1940-10-23, 80 y.o.   MRN: [id number]  Chief Complaint  Patient presents with   Acute Visit    Came in this morning after lungs have been filling up with mucus after the past two days. Does have COPD. Came in with difficulty breathing this morning.     Referring provider: Biagio Borg, MD  HPI: 80 year old male, former smoker. PMH significant for COPD GOLD III, HTN, aortic atherosclerosis, pulmonary infiltrates, acute respiratory failure, GERD, CKD stage 3, hyperlipidemia. Patient   12/06/2020- Interim hx  Patient presents today for acute visit. He developed a productive cough two days ago. He has a difficult time getting mucus up. He has some assocaited sob and wheezing. He has been taking mucinex over the cont. He is currently using Advair 250/50 one puff daily daily, using albuterol rescue inhaler more that last couple of days. He prefers Trelegy 143mcg but could not afford this. He has not tried to apply for patient assistance. He brought in all his medications to today's appointment.   No Known Allergies  Immunization History  Administered Date(s) Administered   Fluad Quad(high Dose 65+) 10/08/2020   Influenza Whole 11/30/2006, 10/03/2007, 12/11/2008, 10/13/2009   Influenza, High Dose Seasonal PF 12/30/2015, 11/15/2016, 10/01/2017   Influenza,inj,Quad PF,6+ Mos 11/17/2014   Influenza-Unspecified 10/24/2019   PFIZER(Purple Top)SARS-COV-2 Vaccination 03/09/2019, 04/08/2019, 10/24/2019   Pneumococcal Conjugate-13 05/01/2013   Pneumococcal Polysaccharide-23 10/03/2007   Td 10/03/2007   Tdap 07/30/2018   Zoster Recombinat (Shingrix) 07/25/2017, 12/03/2017   Zoster, Live 10/03/2007    Past Medical History:  Diagnosis Date   At risk for sleep apnea    STOP-BANG= 5    SENT TO PCP 06-13-2013   Bladder stone    BPH (benign prostatic hyperplasia)    COPD (chronic obstructive pulmonary disease) (HCC)    Diverticulosis of colon    Dysuria     Glucose intolerance (impaired glucose tolerance)    History of kidney stones    Hyperlipidemia    Hypertension    Ureteral disorder 2018   ureteral repair    Tobacco History: Social History   Tobacco Use  Smoking Status Former   Packs/day: 3.00   Years: 34.00   Pack years: 102.00   Types: Cigarettes   Quit date: 01/02/1993   Years since quitting: 27.9  Smokeless Tobacco Never   Counseling given: Not Answered   Outpatient Medications Prior to Visit  Medication Sig Dispense Refill   acetaminophen (TYLENOL) 325 MG tablet Take 650 mg by mouth every 6 (six) hours as needed for mild pain or headache.     albuterol (PROVENTIL HFA;VENTOLIN HFA) 108 (90 Base) MCG/ACT inhaler INHALE 2 PUFFS INTO THE LUNGS EVERY 6 (SIX) HOURS AS NEEDED FOR WHEEZING OR SHORTNESS OF BREATH. 54 g 3   aspirin 81 MG EC tablet Take 1 tablet (81 mg total) by mouth daily. Swallow whole. 30 tablet 12   cholecalciferol (VITAMIN D3) 25 MCG (1000 UNIT) tablet Take 2,000 Units by mouth daily.     cyclobenzaprine (FLEXERIL) 5 MG tablet Take 1 tablet by mouth three times daily as needed for muscle spasm 40 tablet 0   diphenhydrAMINE (BENADRYL) 25 MG tablet Take 50 mg by mouth every 6 (six) hours as needed for allergies.     gabapentin (NEURONTIN) 300 MG capsule Take 1 capsule (300 mg total) by mouth 3 (three) times daily. 270 capsule 1   Guaifenesin 1200 MG TB12 Take 1  tablet by mouth 2 (two) times daily as needed (congestion).     pantoprazole (PROTONIX) 40 MG tablet TAKE 1 TABLET ONCE DAILY 30 TO 60  MINUTES  BEFORE  FIRST  MEAL  OF  THE  DAY 90 tablet 1   predniSONE (DELTASONE) 10 MG tablet 2 tabs daily until better, then 1 tab daily until return to clinic 100 tablet 2   Respiratory Therapy Supplies (FLUTTER) DEVI Use as directed 1 each 0   rosuvastatin (CRESTOR) 20 MG tablet TAKE 1 TABLET EVERY DAY 90 tablet 0   tamsulosin (FLOMAX) 0.4 MG CAPS capsule Take 0.4 mg by mouth daily.     vitamin B-12 (CYANOCOBALAMIN)  1000 MCG tablet Take 1 tablet (1,000 mcg total) by mouth daily. 90 tablet 1   Fluticasone-Umeclidin-Vilant (TRELEGY ELLIPTA) 100-62.5-25 MCG/INH AEPB Inhale 1 puff into the lungs daily. (Patient not taking: Reported on 12/06/2020) 1 each 11   No facility-administered medications prior to visit.    Review of Systems  Review of Systems  Constitutional: Negative.   Respiratory:  Positive for cough and shortness of breath.     Physical Exam  BP 132/76 (BP Location: Left Arm, Patient Position: Sitting, Cuff Size: Normal)   Pulse 98   Temp 97.7 F (36.5 C) (Oral)   Ht 5\' 7"  (1.702 m)   Wt 171 lb 6.4 oz (77.7 kg)   SpO2 99%   BMI 26.85 kg/m  Physical Exam Constitutional:      Appearance: Normal appearance.  HENT:     Head: Normocephalic and atraumatic.     Mouth/Throat:     Mouth: Mucous membranes are moist.     Pharynx: Oropharynx is clear.  Cardiovascular:     Rate and Rhythm: Normal rate and regular rhythm.     Comments: No edema  Pulmonary:     Effort: Pulmonary effort is normal.     Breath sounds: Wheezing present. No rhonchi or rales.  Musculoskeletal:        General: Normal range of motion.  Skin:    General: Skin is warm and dry.  Neurological:     General: No focal deficit present.     Mental Status: He is alert and oriented to person, place, and time. Mental status is at baseline.  Psychiatric:        Mood and Affect: Mood normal.        Behavior: Behavior normal.        Thought Content: Thought content normal.        Judgment: Judgment normal.     Lab Results:  CBC    Component Value Date/Time   WBC 14.6 (H) 06/30/2020 1210   RBC 4.78 06/30/2020 1210   HGB 13.2 06/30/2020 1210   HCT 40.2 06/30/2020 1210   PLT 204.0 06/30/2020 1210   MCV 84.0 06/30/2020 1210   MCH 28.3 01/29/2020 0506   MCHC 32.9 06/30/2020 1210   RDW 13.8 06/30/2020 1210   LYMPHSABS 0.6 (L) 06/30/2020 1210   MONOABS 0.4 06/30/2020 1210   EOSABS 0.0 06/30/2020 1210   BASOSABS 0.0  06/30/2020 1210    BMET    Component Value Date/Time   NA 139 06/30/2020 1210   K 4.1 06/30/2020 1210   CL 104 06/30/2020 1210   CO2 24 06/30/2020 1210   GLUCOSE 135 (H) 06/30/2020 1210   GLUCOSE 101 (H) 01/09/2006 0841   BUN 28 (H) 06/30/2020 1210   CREATININE 1.63 (H) 06/30/2020 1210   CALCIUM 9.5 06/30/2020 1210  CALCIUM 9.3 01/09/2011 1553   GFRNONAA 52 (L) 02/01/2020 0337   GFRAA >60 03/27/2018 0308    BNP    Component Value Date/Time   BNP 116.0 (H) 12/25/2016 1425    ProBNP    Component Value Date/Time   PROBNP 23.0 03/10/2019 1006    Imaging: No results found.   Assessment & Plan:   COPD exacerbation ([place]) - Patient developed productive cough 2 days ago with associated sob and wheezing. VSS. Scattered coarse wheezing through both lung fields on exam. Received depo-medrol 80mg  IM x 1 in office. Sending in RX doxycycline 100mg  twice daily x 7 days and prednisone taper (40mg  x 2 days; 30mg  x 2 days; 20mg  x 2 days; 10mg  x 2 days). We will also give him a sample of Trelegy 154mcg and will help him apply for patient assistance. Return if symptoms do not improve or worsen.      Martyn Ehrich, NP 12/06/2020

## 2020-12-06 NOTE — Patient Instructions (Signed)
Recommendations: Start Trelegy 1 puff daily (stop advair) Use albuterol rescue inhaler 2 puff every 4-6 hours as needed for breakthrough sob/wheezing Continue to take mucinex twice daily to loosen cough/chest congestion   Rx: Doxycycline Prednisone taper  Office treatment: Depo-medrol 80mg  IM x 1 Please help patient apply for patient assistance   Follow- up: As needed if symptoms do not improve or worsen

## 2020-12-06 NOTE — Assessment & Plan Note (Signed)
-   Patient developed productive cough 2 days ago with associated sob and wheezing. VSS. Scattered coarse wheezing through both lung fields on exam. Received depo-medrol 80mg  IM x 1 in office. Sending in RX doxycycline 100mg  twice daily x 7 days and prednisone taper (40mg  x 2 days; 30mg  x 2 days; 20mg  x 2 days; 10mg  x 2 days). We will also give him a sample of Trelegy 199mcg and will help him apply for patient assistance. Return if symptoms do not improve or worsen.

## 2020-12-10 ENCOUNTER — Encounter: Payer: Self-pay | Admitting: Gastroenterology

## 2020-12-10 ENCOUNTER — Ambulatory Visit (INDEPENDENT_AMBULATORY_CARE_PROVIDER_SITE_OTHER): Payer: Medicare HMO | Admitting: Gastroenterology

## 2020-12-10 VITALS — BP 136/68 | HR 80 | Ht 67.0 in | Wt 171.5 lb

## 2020-12-10 DIAGNOSIS — Z1212 Encounter for screening for malignant neoplasm of rectum: Secondary | ICD-10-CM

## 2020-12-10 DIAGNOSIS — Z1211 Encounter for screening for malignant neoplasm of colon: Secondary | ICD-10-CM | POA: Diagnosis not present

## 2020-12-10 NOTE — Progress Notes (Signed)
HPI : Justin Walters is a very pleasant 80 year old male with COPD referred for screening colonoscopy.  The patient does not recall when his last colonoscopy was, but EMR indicates that his last colonoscopy was in 2015 and was normal except for diverticulosis.  Given his age and normal colonscopy, it was recommended he not pursue further screening colonoscopies. The patient denies any concerning GI symptoms.  Specifically, he denies abdominal pain, constipation, diarrhea or blood in stools.  He has regular bowel movements.  He has a history of GERD which is well controlled with once daily PPI.   No family history of colon cancer. He is currently being treated for a COPD exacerbation with steroids and antibiotics.  Past Medical History:  Diagnosis Date   At risk for sleep apnea    STOP-BANG= 5    SENT TO PCP 06-13-2013   Bladder stone    BPH (benign prostatic hyperplasia)    COPD (chronic obstructive pulmonary disease) (HCC)    Diverticulosis of colon    Dysuria    Glucose intolerance (impaired glucose tolerance)    History of kidney stones    Hyperlipidemia    Hypertension    Ureteral disorder 2018   ureteral repair     Past Surgical History:  Procedure Laterality Date   APPENDECTOMY  1980's   CATARACT EXTRACTION W/ INTRAOCULAR LENS  IMPLANT, BILATERAL     COLONOSCOPY  10-05-2003   tics only    CYSTOSCOPY N/A 06/23/2013   Procedure: CYSTOSCOPY WITH LITHOLAPAXY, BLADDER EXPLORATION, REPAIR BLADDER PERFORATION;  Surgeon: Arvil Persons, MD;  Location: [facility];  Service: Urology;  Laterality: N/A;   CYSTOSCOPY/URETEROSCOPY/HOLMIUM LASER/STENT PLACEMENT Left 04/03/2017   Procedure: CYSTOSCOPY LEFT RETROGRADE/LEFT /URETEROSCOPY/HOLMIUM LASER/BASKET STONE EXTRACTION/STENT PLACEMENT/ URETHRAL BIOPSY;  Surgeon: [name], MD;  Location: [facility];  Service: Urology;  Laterality: Left;   EXTRACORPOREAL SHOCK WAVE LITHOTRIPSY Left 10/23/2016   Procedure: LEFT EXTRACORPOREAL  SHOCK WAVE LITHOTRIPSY (ESWL);  Surgeon: Nickie Retort, MD;  Location: [facility];  Service: Urology;  Laterality: Left;   EXTRACORPOREAL SHOCK WAVE LITHOTRIPSY Left 01/08/2017   Procedure: LEFT EXTRACORPOREAL SHOCK WAVE LITHOTRIPSY (ESWL);  Surgeon: [name], MD;  Location[facility];  Service: Urology;  Laterality: Left;   HOLMIUM LASER APPLICATION N/A 3/54/6568   Procedure: HOLMIUM LASER APPLICATION;  Surgeon: Arvil Persons, MD;  Location: [facility];  Service: Urology;  Laterality: N/A;   LAPAROSCOPIC INGUINAL HERNIA REPAIR Bilateral 10-09-2003   TRANSURETHRAL RESECTION OF PROSTATE N/A 06/23/2013   Procedure: TRANSURETHRAL RESECTION OF THE PROSTATE;  Surgeon: Arvil Persons, MD;  Location: [facility];  Service: Urology;  Laterality: N/A;   Family History  Problem Relation Age of Onset   COPD Mother    Cancer Father        unknown   Emphysema Brother    Heart failure Brother    Colon cancer Neg Hx    Esophageal cancer Neg Hx    Rectal cancer Neg Hx    Stomach cancer Neg Hx    Social History   Tobacco Use   Smoking status: Former    Packs/day: 3.00    Years: 34.00    Pack years: 102.00    Types: Cigarettes    Quit date: 01/02/1993    Years since quitting: 27.9   Smokeless tobacco: Never  Vaping Use   Vaping Use: Never used  Substance Use Topics   Alcohol use: No    Alcohol/week: 4.0 standard  drinks    Types: 4 Standard drinks or equivalent per week   Drug use: No   Current Outpatient Medications  Medication Sig Dispense Refill   acetaminophen (TYLENOL) 325 MG tablet Take 650 mg by mouth every 6 (six) hours as needed for mild pain or headache.     albuterol (PROVENTIL HFA;VENTOLIN HFA) 108 (90 Base) MCG/ACT inhaler INHALE 2 PUFFS INTO THE LUNGS EVERY 6 (SIX) HOURS AS NEEDED FOR WHEEZING OR SHORTNESS OF BREATH. 54 g 3   aspirin 81 MG EC tablet Take 1 tablet (81 mg total) by mouth daily. Swallow whole. 30 tablet 12   cholecalciferol (VITAMIN  D3) 25 MCG (1000 UNIT) tablet Take 2,000 Units by mouth daily.     cyclobenzaprine (FLEXERIL) 5 MG tablet Take 1 tablet by mouth three times daily as needed for muscle spasm 40 tablet 0   diphenhydrAMINE (BENADRYL) 25 MG tablet Take 50 mg by mouth every 6 (six) hours as needed for allergies.     doxycycline (VIBRA-TABS) 100 MG tablet Take 1 tablet (100 mg total) by mouth 2 (two) times daily. 14 tablet 0   Fluticasone-Umeclidin-Vilant (TRELEGY ELLIPTA) 100-62.5-25 MCG/INH AEPB Inhale 1 puff into the lungs daily. 1 each 11   gabapentin (NEURONTIN) 300 MG capsule Take 1 capsule (300 mg total) by mouth 3 (three) times daily. 270 capsule 1   Guaifenesin 1200 MG TB12 Take 1 tablet by mouth 2 (two) times daily as needed (congestion).     pantoprazole (PROTONIX) 40 MG tablet TAKE 1 TABLET ONCE DAILY 30 TO 60  MINUTES  BEFORE  FIRST  MEAL  OF  THE  DAY 90 tablet 1   predniSONE (DELTASONE) 10 MG tablet 2 tabs daily until better, then 1 tab daily until return to clinic 100 tablet 2   Respiratory Therapy Supplies (FLUTTER) DEVI Use as directed 1 each 0   rosuvastatin (CRESTOR) 20 MG tablet TAKE 1 TABLET EVERY DAY 90 tablet 0   tamsulosin (FLOMAX) 0.4 MG CAPS capsule Take 0.4 mg by mouth daily.     vitamin B-12 (CYANOCOBALAMIN) 1000 MCG tablet Take 1 tablet (1,000 mcg total) by mouth daily. 90 tablet 1   No current facility-administered medications for this visit.   No Known Allergies   Review of Systems: All systems reviewed and negative except where noted in HPI.    No results found.  Physical Exam: BP 136/68   Pulse 80   Ht 5\' 7"  (1.702 m)   Wt 171 lb 8 oz (77.8 kg)   BMI 26.86 kg/m  Constitutional: Pleasant,well-developed, Caucasian male in no acute distress. HEENT: Normocephalic and atraumatic. Conjunctivae are normal. No scleral icterus. Cardiovascular: Normal rate, regular rhythm.  Pulmonary/chest: Effort normal.  Coarse inspiratory and expiratory breath sounds bilaterally.  No wheezes  appreciated. Abdominal: Soft, nondistended, nontender. Bowel sounds active throughout. There are no masses palpable. No hepatomegaly. Extremities: no edema Neurological: Alert and oriented to person place and time. Skin: Skin is warm and dry. No rashes noted. Psychiatric: Normal mood and affect. Behavior is normal.  CBC    Component Value Date/Time   WBC 14.6 (H) 06/30/2020 1210   RBC 4.78 06/30/2020 1210   HGB 13.2 06/30/2020 1210   HCT 40.2 06/30/2020 1210   PLT 204.0 06/30/2020 1210   MCV 84.0 06/30/2020 1210   MCH 28.3 01/29/2020 0506   MCHC 32.9 06/30/2020 1210   RDW 13.8 06/30/2020 1210   LYMPHSABS 0.6 (L) 06/30/2020 1210   MONOABS 0.4 06/30/2020 1210   EOSABS 0.0  06/30/2020 1210   BASOSABS 0.0 06/30/2020 1210    CMP     Component Value Date/Time   NA 139 06/30/2020 1210   K 4.1 06/30/2020 1210   CL 104 06/30/2020 1210   CO2 24 06/30/2020 1210   GLUCOSE 135 (H) 06/30/2020 1210   GLUCOSE 101 (H) 01/09/2006 0841   BUN 28 (H) 06/30/2020 1210   CREATININE 1.63 (H) 06/30/2020 1210   CALCIUM 9.5 06/30/2020 1210   CALCIUM 9.3 01/09/2011 1553   PROT 7.2 06/30/2020 1210   ALBUMIN 4.4 06/30/2020 1210   AST 14 06/30/2020 1210   ALT 12 06/30/2020 1210   ALKPHOS 53 06/30/2020 1210   BILITOT 0.6 06/30/2020 1210   GFRNONAA 52 (L) 02/01/2020 0337   GFRAA >60 03/27/2018 0308     ASSESSMENT AND PLAN: 80 year old male with no chronic GI symptoms, no family history of colon cancer, and with normal colonoscopy in 2015.  Given his age and normal colonoscopy at age 43, it is not recommended that he pursue further colon cancer screening.  He does not have any GI symptoms that would warrant a diagnostic colonoscopy.  CRC screening - No longer recommended given age and lack of previous high risk polyps  Justin Walters E. Candis Schatz, MD [facility]   CC:  Biagio Borg, MD

## 2020-12-10 NOTE — Patient Instructions (Signed)
If you are age 79 or older, your body mass index should be between 23-30. Your Body mass index is 26.86 kg/m. If this is out of the aforementioned range listed, please consider follow up with your Primary Care Provider.  If you are age 60 or younger, your body mass index should be between 19-25. Your Body mass index is 26.86 kg/m. If this is out of the aformentioned range listed, please consider follow up with your Primary Care Provider.   The [facility] providers would like to encourage you to use [facility] to communicate with providers for non-urgent requests or questions.  Due to long hold times on the telephone, sending your provider a message by [facility] may be a faster and more efficient way to get a response.  Please allow 48 business hours for a response.  Please remember that this is for non-urgent requests.   It was a pleasure to see you today!  Thank you for trusting me with your gastrointestinal care!    Scott E.Candis Schatz, MD

## 2021-01-04 ENCOUNTER — Other Ambulatory Visit (INDEPENDENT_AMBULATORY_CARE_PROVIDER_SITE_OTHER): Payer: Medicare HMO

## 2021-01-04 DIAGNOSIS — E7800 Pure hypercholesterolemia, unspecified: Secondary | ICD-10-CM | POA: Diagnosis not present

## 2021-01-04 DIAGNOSIS — E559 Vitamin D deficiency, unspecified: Secondary | ICD-10-CM | POA: Diagnosis not present

## 2021-01-04 DIAGNOSIS — R7302 Impaired glucose tolerance (oral): Secondary | ICD-10-CM

## 2021-01-04 DIAGNOSIS — E538 Deficiency of other specified B group vitamins: Secondary | ICD-10-CM

## 2021-01-04 DIAGNOSIS — Z0000 Encounter for general adult medical examination without abnormal findings: Secondary | ICD-10-CM | POA: Diagnosis not present

## 2021-01-04 LAB — LIPID PANEL
Cholesterol: 157 mg/dL (ref 0–200)
HDL: 77.6 mg/dL (ref >39.00)
LDL Cholesterol: 69 mg/dL (ref 0–99)
NonHDL: 79.37
Total CHOL/HDL Ratio: 2
Triglycerides: 54 mg/dL (ref 0.0–149.0)
VLDL: 10.8 mg/dL (ref 0.0–40.0)

## 2021-01-04 LAB — CBC WITH DIFFERENTIAL/PLATELET
Basophils Absolute: 0 10*3/uL (ref 0.0–0.1)
Basophils Relative: 0.2 % (ref 0.0–3.0)
Eosinophils Absolute: 0 10*3/uL (ref 0.0–0.7)
Eosinophils Relative: 0.1 % (ref 0.0–5.0)
HCT: 44 % (ref 39.0–52.0)
Hemoglobin: 13.9 g/dL (ref 13.0–17.0)
Lymphocytes Relative: 11.3 % — ABNORMAL LOW (ref 12.0–46.0)
Lymphs Abs: 0.9 10*3/uL (ref 0.7–4.0)
MCHC: 31.7 g/dL (ref 30.0–36.0)
MCV: 83.5 fl (ref 78.0–100.0)
Monocytes Absolute: 0.4 10*3/uL (ref 0.1–1.0)
Monocytes Relative: 5.7 % (ref 3.0–12.0)
Neutro Abs: 6.3 10*3/uL (ref 1.4–7.7)
Neutrophils Relative %: 82.7 % — ABNORMAL HIGH (ref 43.0–77.0)
Platelets: 194 10*3/uL (ref 150.0–400.0)
RBC: 5.27 Mil/uL (ref 4.22–5.81)
RDW: 16.9 % — ABNORMAL HIGH (ref 11.5–15.5)
WBC: 7.6 10*3/uL (ref 4.0–10.5)

## 2021-01-04 LAB — URINALYSIS, ROUTINE W REFLEX MICROSCOPIC
Bilirubin Urine: NEGATIVE
Hgb urine dipstick: NEGATIVE
Ketones, ur: NEGATIVE
Leukocytes,Ua: NEGATIVE
Nitrite: NEGATIVE
RBC / HPF: NONE SEEN (ref 0–?)
Specific Gravity, Urine: >=1.03 — AB (ref 1.000–1.030)
Total Protein, Urine: NEGATIVE
Urine Glucose: NEGATIVE
Urobilinogen, UA: 0.2 (ref 0.0–1.0)
pH: 6 (ref 5.0–8.0)

## 2021-01-04 LAB — HEPATIC FUNCTION PANEL
ALT: 11 U/L (ref 0–53)
AST: 10 U/L (ref 0–37)
Albumin: 3.9 g/dL (ref 3.5–5.2)
Alkaline Phosphatase: 47 U/L (ref 39–117)
Bilirubin, Direct: 0.1 mg/dL (ref 0.0–0.3)
Total Bilirubin: 0.7 mg/dL (ref 0.2–1.2)
Total Protein: 6.6 g/dL (ref 6.0–8.3)

## 2021-01-04 LAB — BASIC METABOLIC PANEL
BUN: 21 mg/dL (ref 6–23)
CO2: 27 mEq/L (ref 19–32)
Calcium: 9 mg/dL (ref 8.4–10.5)
Chloride: 104 mEq/L (ref 96–112)
Creatinine, Ser: 1.51 mg/dL — ABNORMAL HIGH (ref 0.40–1.50)
GFR: 43.53 mL/min — ABNORMAL LOW (ref >60.00)
Glucose, Bld: 97 mg/dL (ref 70–99)
Potassium: 5 mEq/L (ref 3.5–5.1)
Sodium: 141 mEq/L (ref 135–145)

## 2021-01-04 LAB — VITAMIN D 25 HYDROXY (VIT D DEFICIENCY, FRACTURES): VITD: 51.39 ng/mL (ref 30.00–100.00)

## 2021-01-04 LAB — VITAMIN B12: Vitamin B-12: 1375 pg/mL — ABNORMAL HIGH (ref 211–911)

## 2021-01-04 LAB — TSH: TSH: 1.95 u[IU]/mL (ref 0.35–5.50)

## 2021-01-04 LAB — HEMOGLOBIN A1C: Hgb A1c MFr Bld: 6.7 % — ABNORMAL HIGH (ref 4.6–6.5)

## 2021-01-04 LAB — PSA: PSA: 0.67 ng/mL (ref 0.10–4.00)

## 2021-01-06 ENCOUNTER — Other Ambulatory Visit: Payer: Self-pay

## 2021-01-06 ENCOUNTER — Ambulatory Visit (INDEPENDENT_AMBULATORY_CARE_PROVIDER_SITE_OTHER): Payer: Medicare HMO | Admitting: Internal Medicine

## 2021-01-06 ENCOUNTER — Encounter: Payer: Self-pay | Admitting: Internal Medicine

## 2021-01-06 VITALS — BP 118/70 | HR 66 | Temp 97.9°F | Ht 67.0 in | Wt 172.0 lb

## 2021-01-06 DIAGNOSIS — E559 Vitamin D deficiency, unspecified: Secondary | ICD-10-CM | POA: Diagnosis not present

## 2021-01-06 DIAGNOSIS — H6121 Impacted cerumen, right ear: Secondary | ICD-10-CM

## 2021-01-06 DIAGNOSIS — J449 Chronic obstructive pulmonary disease, unspecified: Secondary | ICD-10-CM

## 2021-01-06 DIAGNOSIS — Z0001 Encounter for general adult medical examination with abnormal findings: Secondary | ICD-10-CM

## 2021-01-06 DIAGNOSIS — E538 Deficiency of other specified B group vitamins: Secondary | ICD-10-CM | POA: Diagnosis not present

## 2021-01-06 DIAGNOSIS — N1831 Chronic kidney disease, stage 3a: Secondary | ICD-10-CM | POA: Diagnosis not present

## 2021-01-06 DIAGNOSIS — I10 Essential (primary) hypertension: Secondary | ICD-10-CM

## 2021-01-06 DIAGNOSIS — R7302 Impaired glucose tolerance (oral): Secondary | ICD-10-CM

## 2021-01-06 DIAGNOSIS — I700 Atherosclerosis of aorta: Secondary | ICD-10-CM

## 2021-01-06 MED ORDER — DAPAGLIFLOZIN PROPANEDIOL 5 MG PO TABS: 5.0000 mg | ORAL_TABLET | Freq: Every day | ORAL | 3 refills | Status: AC

## 2021-01-06 NOTE — Patient Instructions (Addendum)
Your right ear was irrigated of wax today  OK to decrease the B12 to Mon-wed-Friday  Please take all new medication as prescribed - the [facility] if not too expensive  Please call if you change your mind about the Cardiac CT Score testing  Please continue all other medications as before, and refills have been done if requested.  Please have the pharmacy call with any other refills you may need.  Please continue your efforts at being more active, low cholesterol diet, and weight control.  You are otherwise up to date with prevention measures today.  Please keep your appointments with your specialists as you may have planned - Kidney doctor at one year  Please make an Appointment to return in 6 months, or sooner if needed, also with Lab Appointment for testing done 3-5 days before at the [facility] (so this is for TWO appointments - please see the scheduling desk as you leave)  Due to the ongoing Covid 19 pandemic, our lab now requires an appointment for any labs done at our office.  If you need labs done and do not have an appointment, please call our office ahead of time to schedule before presenting to the lab for your testing.

## 2021-01-06 NOTE — Progress Notes (Signed)
Patient consent obtained. Irrigation with water and peroxide performed on right ear only. Full view of tympanic membranes after procedure.  Patient tolerated procedure well.

## 2021-01-06 NOTE — Progress Notes (Signed)
Patient ID: Justin Walters, male   DOB: 13-Aug-1940, 81 y.o.   MRN: [id number]         Chief Complaint:: wellness exam and Follow-up Low b12, DM, copd       HPI:  Justin Walters is a 81 y.o. male here for wellness exam; declines covid booster, o/w up to date                        Also taking b12 daily  Pt denies chest pain, increased sob or doe, wheezing, orthopnea, PND, increased LE swelling, palpitations, dizziness or syncope.   Pt denies polydipsia, polyuria, or new focal neuro s/s.   Pt denies fever, wt loss, night sweats, loss of appetite, or other constitutional symptoms    Does have right ear hearing loss worse in the past wk, without HA, fever, ear pain, tinnitus or vertigo   Wt Readings from Last 3 Encounters:  01/06/21 172 lb (78 kg)  12/10/20 171 lb 8 oz (77.8 kg)  12/06/20 171 lb 6.4 oz (77.7 kg)   BP Readings from Last 3 Encounters:  01/06/21 118/70  12/10/20 136/68  12/06/20 132/76   Immunization History  Administered Date(s) Administered   Fluad Quad(high Dose 65+) 10/08/2020   Influenza Whole 11/30/2006, 10/03/2007, 12/11/2008, 10/13/2009   Influenza, High Dose Seasonal PF 12/30/2015, 11/15/2016, 10/01/2017   Influenza,inj,Quad PF,6+ Mos 11/17/2014   Influenza-Unspecified 10/24/2019   PFIZER(Purple Top)SARS-COV-2 Vaccination 03/09/2019, 04/08/2019, 10/24/2019   Pneumococcal Conjugate-13 05/01/2013   Pneumococcal Polysaccharide-23 10/03/2007   Td 10/03/2007   Tdap 07/30/2018   Zoster Recombinat (Shingrix) 07/25/2017, 12/03/2017   Zoster, Live 10/03/2007   There are no preventive care reminders to display for this patient.     Past Medical History:  Diagnosis Date   At risk for sleep apnea    STOP-BANG= 5    SENT TO PCP 06-13-2013   Bladder stone    BPH (benign prostatic hyperplasia)    COPD (chronic obstructive pulmonary disease) (HCC)    Diverticulosis of colon    Dysuria    Glucose intolerance (impaired glucose tolerance)    History of kidney stones     Hyperlipidemia    Hypertension    Ureteral disorder 2018   ureteral repair   Past Surgical History:  Procedure Laterality Date   APPENDECTOMY  1980's   CATARACT EXTRACTION W/ INTRAOCULAR LENS  IMPLANT, BILATERAL     COLONOSCOPY  10-05-2003   tics only    CYSTOSCOPY N/A 06/23/2013   Procedure: CYSTOSCOPY WITH LITHOLAPAXY, BLADDER EXPLORATION, REPAIR BLADDER PERFORATION;  Surgeon: Arvil Persons, MD;  Location: [facility];  Service: Urology;  Laterality: N/A;   CYSTOSCOPY/URETEROSCOPY/HOLMIUM LASER/STENT PLACEMENT Left 04/03/2017   Procedure: CYSTOSCOPY LEFT RETROGRADE/LEFT /URETEROSCOPY/HOLMIUM LASER/BASKET STONE EXTRACTION/STENT PLACEMENT/ URETHRAL BIOPSY;  Surgeon: [name], MD;  Location[facility];  Service: Urology;  Laterality: Left;   EXTRACORPOREAL SHOCK WAVE LITHOTRIPSY Left 10/23/2016   Procedure: LEFT EXTRACORPOREAL SHOCK WAVE LITHOTRIPSY (ESWL);  Surgeon: Nickie Retort, MD;  Location: [facility];  Service: Urology;  Laterality: Left;   EXTRACORPOREAL SHOCK WAVE LITHOTRIPSY Left 01/08/2017   Procedure: LEFT EXTRACORPOREAL SHOCK WAVE LITHOTRIPSY (ESWL);  Surgeon: [name], MD;  Location[facility];  Service: Urology;  Laterality: Left;   HOLMIUM LASER APPLICATION N/A 3/71/0626   Procedure: HOLMIUM LASER APPLICATION;  Surgeon: Arvil Persons, MD;  [facility];  Service: Urology;  Laterality: N/A;   LAPAROSCOPIC INGUINAL HERNIA REPAIR Bilateral 10-09-2003  TRANSURETHRAL RESECTION OF PROSTATE N/A 06/23/2013   Procedure: TRANSURETHRAL RESECTION OF THE PROSTATE;  Surgeon: Arvil Persons, MD;  Location: [facility];  Service: Urology;  Laterality: N/A;    reports that he quit smoking about 28 years ago. His smoking use included cigarettes. He has a 102.00 pack-year smoking history. He has never used smokeless tobacco. He reports that he does not drink alcohol and does not use drugs. family history includes COPD in his mother; Cancer  in his father; Emphysema in his brother; Heart failure in his brother. No Known Allergies Current Outpatient Medications on File Prior to Visit  Medication Sig Dispense Refill   acetaminophen (TYLENOL) 325 MG tablet Take 650 mg by mouth every 6 (six) hours as needed for mild pain or headache.     albuterol (PROVENTIL HFA;VENTOLIN HFA) 108 (90 Base) MCG/ACT inhaler INHALE 2 PUFFS INTO THE LUNGS EVERY 6 (SIX) HOURS AS NEEDED FOR WHEEZING OR SHORTNESS OF BREATH. 54 g 3   aspirin 81 MG EC tablet Take 1 tablet (81 mg total) by mouth daily. Swallow whole. 30 tablet 12   cholecalciferol (VITAMIN D3) 25 MCG (1000 UNIT) tablet Take 2,000 Units by mouth daily.     diphenhydrAMINE (BENADRYL) 25 MG tablet Take 50 mg by mouth every 6 (six) hours as needed for allergies.     Fluticasone-Umeclidin-Vilant (TRELEGY ELLIPTA) 100-62.5-25 MCG/INH AEPB Inhale 1 puff into the lungs daily. 1 each 11   gabapentin (NEURONTIN) 300 MG capsule Take 1 capsule (300 mg total) by mouth 3 (three) times daily. 270 capsule 1   Guaifenesin 1200 MG TB12 Take 1 tablet by mouth 2 (two) times daily as needed (congestion).     pantoprazole (PROTONIX) 40 MG tablet TAKE 1 TABLET ONCE DAILY 30 TO 60  MINUTES  BEFORE  FIRST  MEAL  OF  THE  DAY 90 tablet 1   predniSONE (DELTASONE) 10 MG tablet 2 tabs daily until better, then 1 tab daily until return to clinic 100 tablet 2   Respiratory Therapy Supplies (FLUTTER) DEVI Use as directed 1 each 0   rosuvastatin (CRESTOR) 20 MG tablet TAKE 1 TABLET EVERY DAY 90 tablet 0   tamsulosin (FLOMAX) 0.4 MG CAPS capsule Take 0.4 mg by mouth daily.     vitamin B-12 (CYANOCOBALAMIN) 1000 MCG tablet Take 1 tablet (1,000 mcg total) by mouth daily. 90 tablet 1   No current facility-administered medications on file prior to visit.        ROS:  All others reviewed and negative.  Objective        PE:  BP 118/70 (BP Location: Right Arm, Patient Position: Sitting, Cuff Size: Normal)    Pulse 66    Temp 97.9 F  (36.6 C) (Oral)    Ht 5\' 7"  (1.702 m)    Wt 172 lb (78 kg)    SpO2 98%    BMI 26.94 kg/m                 Constitutional: Pt appears in NAD               HENT: Head: NCAT.                Right Ear: External ear normal.  Right canal irrigated with wax impaction resolved and hearing improved               Left Ear: External ear normal.  Eyes: . Pupils are equal, round, and reactive to light. Conjunctivae and EOM are normal               Nose: without d/c or deformity               Neck: Neck supple. Gross normal ROM               Cardiovascular: Normal rate and regular rhythm.                 Pulmonary/Chest: Effort normal and breath sounds without rales or wheezing.                Abd:  Soft, NT, ND, + BS, no organomegaly               Neurological: Pt is alert. At baseline orientation, motor grossly intact               Skin: Skin is warm. No rashes, no other new lesions, LE edema - none               Psychiatric: Pt behavior is normal without agitation   Micro: none  Cardiac tracings I have personally interpreted today:  none  Pertinent Radiological findings (summarize): none   Lab Results  Component Value Date   WBC 7.6 01/04/2021   HGB 13.9 01/04/2021   HCT 44.0 01/04/2021   PLT 194.0 01/04/2021   GLUCOSE 97 01/04/2021   CHOL 157 01/04/2021   TRIG 54.0 01/04/2021   HDL 77.60 01/04/2021   LDLDIRECT 161.9 03/28/2012   LDLCALC 69 01/04/2021   ALT 11 01/04/2021   AST 10 01/04/2021   NA 141 01/04/2021   K 5.0 01/04/2021   CL 104 01/04/2021   CREATININE 1.51 (H) 01/04/2021   BUN 21 01/04/2021   CO2 27 01/04/2021   TSH 1.95 01/04/2021   PSA 0.67 01/04/2021   HGBA1C 6.7 (H) 01/04/2021   Assessment/Plan:  LAWRNCE REYEZ is a 81 y.o. White or Caucasian [1] male with  has a past medical history of At risk for sleep apnea, Bladder stone, BPH (benign prostatic hyperplasia), COPD (chronic obstructive pulmonary disease) ([place]), Diverticulosis of colon, Dysuria, Glucose  intolerance (impaired glucose tolerance), History of kidney stones, Hyperlipidemia, Hypertension, and Ureteral disorder (2018).  Encounter for well adult exam with abnormal findings Age and sex appropriate education and counseling updated with regular exercise and diet Referrals for preventative services - none needed Immunizations addressed - declines covid booster Smoking counseling  - none needed Evidence for depression or other mood disorder - none significant Most recent labs reviewed. I have personally reviewed and have noted: 1) the patient's medical and social history 2) The patient's current medications and supplements 3) The patient's height, weight, and BMI have been recorded in the chart   COPD GOLD III Overall stable, cont inhalers  Aortic atherosclerosis ([place]) For continued crestor, low chol diet, excercise  CKD (chronic kidney disease) stage 3, GFR 30-59 ml/min (HCC) Lab Results  Component Value Date   CREATININE 1.51 (H) 01/04/2021   Stable overall, cont to avoid nephrotoxins, add farxiga preventive   Essential hypertension BP Readings from Last 3 Encounters:  01/06/21 118/70  12/10/20 136/68  12/06/20 132/76   Stable, pt to continue medical treatment  - diet, wt control   Impaired glucose tolerance Also in light of ckd, add farxiga 5 qd  Vitamin D deficiency Last vitamin D Lab Results  Component Value Date   VD25OH 51.39 01/04/2021  Stable, cont oral replacement   Vitamin B12 deficiency Lab Results  Component Value Date   VITAMINB12 1,375 (H) 01/04/2021   overcontrolled, cont oral replacement at reduced mon wed Friday - b12 1000 mcg qd   Impacted cerumen of right ear Resolved with irrigation,   Ceruminosis is noted.  Wax is removed by syringing and manual debridement. Instructions for home care to prevent wax buildup are given.  Followup: Return in about 6 months (around 07/06/2021).  Cathlean Cower, MD 01/09/2021 8:03 PM [facility]

## 2021-01-09 ENCOUNTER — Encounter: Payer: Self-pay | Admitting: Internal Medicine

## 2021-01-09 DIAGNOSIS — H6121 Impacted cerumen, right ear: Secondary | ICD-10-CM | POA: Insufficient documentation

## 2021-01-09 NOTE — Assessment & Plan Note (Addendum)

## 2021-01-09 NOTE — Addendum Note (Signed)
Addended by: Biagio Borg on: 01/09/2021 08:05 PM   Modules accepted: Orders

## 2021-01-09 NOTE — Assessment & Plan Note (Signed)
Last vitamin D Lab Results  Component Value Date   VD25OH 51.39 01/04/2021   Stable, cont oral replacement

## 2021-01-09 NOTE — Assessment & Plan Note (Signed)
For continued crestor, low chol diet, excercise

## 2021-01-09 NOTE — Assessment & Plan Note (Signed)
Resolved with irrigation, Ceruminosis is noted.  Wax is removed by syringing and manual debridement. Instructions for home care to prevent wax buildup are given.  

## 2021-01-09 NOTE — Assessment & Plan Note (Signed)
Also in light of ckd, add farxiga 5 qd

## 2021-01-09 NOTE — Assessment & Plan Note (Addendum)
Lab Results  Component Value Date   CREATININE 1.51 (H) 01/04/2021   Stable overall, cont to avoid nephrotoxins, add farxiga preventive

## 2021-01-09 NOTE — Assessment & Plan Note (Signed)
Lab Results  Component Value Date   [id number] 7,902 (H) 01/04/2021   overcontrolled, cont oral replacement at reduced mon wed Friday - b12 1000 mcg qd

## 2021-01-09 NOTE — Assessment & Plan Note (Signed)
BP Readings from Last 3 Encounters:  01/06/21 118/70  12/10/20 136/68  12/06/20 132/76   Stable, pt to continue medical treatment  - diet, wt control

## 2021-01-09 NOTE — Assessment & Plan Note (Signed)
Overall stable, cont inhalers

## 2021-01-13 ENCOUNTER — Ambulatory Visit: Payer: Medicare HMO | Admitting: Internal Medicine

## 2021-02-09 ENCOUNTER — Ambulatory Visit: Payer: Medicare HMO | Admitting: Internal Medicine

## 2021-02-09 ENCOUNTER — Encounter: Payer: Self-pay | Admitting: Internal Medicine

## 2021-02-09 ENCOUNTER — Other Ambulatory Visit: Payer: Self-pay

## 2021-02-09 ENCOUNTER — Ambulatory Visit (INDEPENDENT_AMBULATORY_CARE_PROVIDER_SITE_OTHER): Payer: Medicare HMO

## 2021-02-09 DIAGNOSIS — J449 Chronic obstructive pulmonary disease, unspecified: Secondary | ICD-10-CM | POA: Diagnosis not present

## 2021-02-09 DIAGNOSIS — R059 Cough, unspecified: Secondary | ICD-10-CM | POA: Diagnosis not present

## 2021-02-09 DIAGNOSIS — R918 Other nonspecific abnormal finding of lung field: Secondary | ICD-10-CM

## 2021-02-09 MED ORDER — PREDNISONE 10 MG PO TABS: ORAL_TABLET | ORAL | 2 refills | Status: DC

## 2021-02-09 MED ORDER — FLUTICASONE-UMECLIDIN-VILANT 100-62.5-25 MCG/ACT IN AEPB: 1.0000 | INHALATION_SPRAY | Freq: Every day | RESPIRATORY_TRACT | 11 refills | Status: DC

## 2021-02-09 NOTE — Progress Notes (Signed)
Subjective:     Patient ID: Justin Walters, male   DOB: [birth year],     MRN: [id number]    Brief patient profile:  83   yowm MM quit smoking 1995 with cough/wheeze/ sob and never recovered with documented GOLD III copd 02/23/2017      History of Present Illness  01/25/2017 1st  office visit/ Justin Walters   GOLD III with reversibility  Chief Complaint  Patient presents with   Pulmonary Consult    Referred by the hospital. He c/o SOB and coughing for the past several wks. His cough is occ prod, but he is unsure of sputum color. He is SOB with exertion and also when he lies down.  He gets winded walking approx 100 yards. He is using his albuterol inhaler 3 x per wk on average.   baseline doe across the street to son's house  nl pace and then onset   x one year gradually worse doe and no better on inhalers but poor baseline hfa/ assoc with cough / sense of chest congestion and has to prop up at 30 degrees now at hs to get comfortable Present doe = MMRC3 = can't walk 100 yards even at a slow pace at a flat grade s stopping due to sob  Easily confused with details of care/ names of meds / not using spiriva/ was better on pred and worse since tapered rec Plan A = Automatic =  symbicort 160 Take 2 puffs first thing in am and then another 2 puffs about 12 hours later.  Work on inhaler technique:  Plan B = Backup Only use your albuterol  as a rescue  Prednisone 10 mg take  4 each am x 2 days,   2 each am x 2 days,  1 each am x 2 days and stop     02/23/2017  f/u ov/Justin Walters re:  GOLD III copd / no 02 Chief Complaint  Patient presents with   Follow-up    PFT's today.  Breathing has improved some. He is rarely using his albuterol inhaler. He has been feeling fatigued.    better p prednisone then gradually worse  Dyspnea:  MMRC2 = can't walk a nl pace on a flat grade s sob but does fine slow and flat  Cough: none Sleep: ok 30 degrees no am congestion rec Add tudorza one puff after symbicort in am and in pm   Work on inhaler technique:      05/24/2017  f/u ov/Justin Walters re:  Copd III/ no 02  Just on maint symb 160 / could not afford tudorza and not sure it helped doe  Chief Complaint  Patient presents with   Follow-up    Breathing is doing well. He rarely uses his albuterol inhaler. No new co's.   Dyspnea:  MMRC2 = can't walk a nl pace on a flat grade s sob but does fine slow pace pretty much anywhere he wants to go  Cough: none Sleep: ok flat SABA use:  Rare  Hb every hs x sev months / resolves p about an hour, better p sit up / no assoc dysphagia or daytime symptoms/ varies some with how early he eats supper and how much, not sure what type meals make it worse  rec GERD diet  If not better add pepcid ac 20 mg one hour before bed > did not  Do   03/25/2018  f/u ov/Justin Walters re:  COPD III overall worse since Oct 2019, esp since Feb 02 2018 Chief Complaint  Patient presents with   Follow-up    Breathing has been progressively worse since the last visit. He is using his rescue inhaler daily and neb 4 x daily.   Dyspnea:  Across the room on 3lpm  Cough: turned green x sev days prior to OV / sometimes coughs so hard hurts in chest (ant/ generalized)  but not using flutter as rec   Sleeping: bed blocks does fine most nocts SABA use: as above  02:  3lpm 24/7  rec Change BREO / Incruse to Anoro click x one and take two good drags (hold with the R hand) Prednisone 10 mg take  4 each am x 2 days,   2 each am x 2 days,  1 each am x 2 days and stop  Augmentin 875 mg take one pill twice daily  X 10 days -  Plan B = Backup Only use your albuterol inhaler as a rescue medication Plan C = Crisis - only use your albuterol nebulizer(NOT your ipatropium/albuterol)  if you first try Plan B Add protonix 40 mg Take 30-60 min before first meal of the day  The goal with 02 is to keep your 02 saturations over 90% at all times       07/08/2020  f/u ov/Justin Walters re: GOLD III  maint on trelgy when can afford it, otherwise  uses wixella 250 with pred 20 mg daily prn flare  Chief Complaint  Patient presents with   Follow-up    Breathing is overall doing well. He states he rarely uses his albuterol inhaler.    Dyspnea:  still walking to son's 100 yards flat s stopping  Cough: minimal / no purulent sputum  Sleeping: 6in blocks/ no resp cc SABA use: very rare saba hfa and doesn't have/ need neb saba 02: none Covid status:   vax x 3 plus had omicron likely  When flares it's usually noct wheeze / cough and takes 2-3 days of pred 20 then stops and better for weeks to months Rec No change in recs Use arm and hammer toothaste after the inhaler and make a slurry with it /gargle  Please schedule a follow up visit in 6  months but call sooner if needed     02/09/2021  f/u ov/Justin Walters re: GOLD  3    maint on trelelgy   Chief Complaint  Patient presents with   Follow-up    Sob-slightly better,occass. Cough-milky white, wheezing  Dyspnea:  still walking to son's house, yardwork  Cough: minimal in am / mucoid   Sleeping: 6 in bed blocks/ no resp cc  SABA use: rarely  02: none  Covid status:  vax x 3  no bivalent   No obvious day to day or daytime variability or assoc  purulent sputum or mucus plugs or hemoptysis or cp or chest tightness, subjective wheeze or overt sinus or hb symptoms.   Sleeping  without nocturnal  or early am exacerbation  of respiratory  c/o's or need for noct saba. Also denies any obvious fluctuation of symptoms with weather or environmental changes or other aggravating or alleviating factors except as outlined above   No unusual exposure hx or h/o childhood pna/ asthma or knowledge of premature birth.  Current Allergies, Complete Past Medical History, Past Surgical History, Family History, and Social History were reviewed in [employer] record.  ROS  The following are not active complaints unless bolded Hoarseness, sore throat, dysphagia, dental problems, itching, sneezing,  nasal congestion or discharge of excess mucus or purulent secretions, ear ache,   fever, chills, sweats, unintended wt loss or wt gain, classically pleuritic or exertional cp,  orthopnea pnd or arm/hand swelling  or leg swelling, presyncope, palpitations, abdominal pain, anorexia, nausea, vomiting, diarrhea  or change in bowel habits or change in bladder habits, change in stools or change in urine, dysuria, hematuria,  rash, arthralgias, visual complaints, headache, numbness, weakness or ataxia or problems with walking or coordination,  change in mood or  memory.        Current Meds  Medication Sig   acetaminophen (TYLENOL) 325 MG tablet Take 650 mg by mouth every 6 (six) hours as needed for mild pain or headache.   albuterol (PROVENTIL HFA;VENTOLIN HFA) 108 (90 Base) MCG/ACT inhaler INHALE 2 PUFFS INTO THE LUNGS EVERY 6 (SIX) HOURS AS NEEDED FOR WHEEZING OR SHORTNESS OF BREATH.   aspirin 81 MG EC tablet Take 1 tablet (81 mg total) by mouth daily. Swallow whole.   cholecalciferol (VITAMIN D3) 25 MCG (1000 UNIT) tablet Take 2,000 Units by mouth daily.   dapagliflozin propanediol (FARXIGA) 5 MG TABS tablet Take 1 tablet (5 mg total) by mouth daily before breakfast.   diphenhydrAMINE (BENADRYL) 25 MG tablet Take 50 mg by mouth every 6 (six) hours as needed for allergies.   famotidine (PEPCID) 20 MG tablet Take 20 mg by mouth daily.   Fluticasone-Umeclidin-Vilant (TRELEGY ELLIPTA) 100-62.5-25 MCG/INH AEPB Inhale 1 puff into the lungs daily.   gabapentin (NEURONTIN) 300 MG capsule Take 1 capsule (300 mg total) by mouth 3 (three) times daily.   Guaifenesin 1200 MG TB12 Take 1 tablet by mouth 2 (two) times daily as needed (congestion).   pantoprazole (PROTONIX) 40 MG tablet TAKE 1 TABLET ONCE DAILY 30 TO 60  MINUTES  BEFORE  FIRST  MEAL  OF  THE  DAY   predniSONE (DELTASONE) 10 MG tablet 2 tabs daily until better, then 1 tab daily until return to clinic   Respiratory Therapy Supplies (FLUTTER) DEVI Use as  directed   rosuvastatin (CRESTOR) 20 MG tablet TAKE 1 TABLET EVERY DAY   tamsulosin (FLOMAX) 0.4 MG CAPS capsule Take 0.4 mg by mouth daily.   vitamin B-12 (CYANOCOBALAMIN) 1000 MCG tablet Take 1 tablet (1,000 mcg total) by mouth daily.                Objective:   Physical Exam   Wt  02/09/2021          169 07/08/2020          170  01/05/2020         179 09/30/2019        176  06/11/2019          180  03/10/2019          178 09/10/2018          174  06/06/2018          171  03/25/2018       155  03/08/2018         149   01/25/2018       156  12/05/2017       157  11/21/2017     157  05/24/2017        164    01/25/17 150 lb 12.8 oz (68.4 kg)  01/18/17 150 lb (68 kg)  01/08/17 150 lb (68 kg)     Vital signs reviewed  02/09/2021  - Note at rest  02 sats  97% on RA   General appearance:    amb pleasant wm prior to am trelegy slt ratttle    HEENT : pt wearing mask not removed for exam due to covid -19 concerns.    NECK :  without JVD/Nodes/TM/ nl carotid upstrokes bilaterally   LUNGS: no acc muscle use,  Mod barrel  contour chest wall with bilateral  Distant bs s audible wheeze and  without cough on insp or exp maneuvers and mod  Hyperresonant  to  percussion bilaterally     CV:  RRR  no s3 or murmur or increase in P2, and no edema   ABD:  soft and nontender with pos mid insp Hoover's  in the supine position. No bruits or organomegaly appreciated, bowel sounds nl  MS:     ext warm without deformities, calf tenderness, cyanosis or clubbing No obvious joint restrictions   SKIN: warm and dry without lesions    NEURO:  alert, approp, nl sensorium with  no motor or cerebellar deficits apparent.    CXR PA and Lateral:   02/09/2021 :    I personally reviewed images and agree with radiology impression as follows:    Stable chronic interstitial changes. No radiographic evidence of acute cardiopulmonary disease.      Assessment:

## 2021-02-09 NOTE — Patient Instructions (Signed)
No change in medications    Please schedule a follow up visit in 6  months but call sooner if needed  

## 2021-02-10 ENCOUNTER — Encounter: Payer: Self-pay | Admitting: Internal Medicine

## 2021-02-10 NOTE — Assessment & Plan Note (Addendum)
Quit smoking 1995/MM PFT's  01/20/2017  FEV1 1.05 (39 % ) ratio 54  p 18 % improvement from saba p ? prior to study with DLCO  50/53  % corrects to 77 % for alv volume  - 01/25/2017   rec increase symb to 160 2bid trial  - alpha one AT screening  01/18/17   MM  Level 190 PFT's  02/23/2017  FEV1 1.17 (44 % ) ratio 54  p 0 % improvement from saba p symb 160 prior to study with DLCO   39 % corrects to 59 % for alv volume  - 02/23/2017    add tudorza trial > no change and could not afford - 12/05/2017 flutter valve added   - 02/22/2018    > restart symb 160 2bid - 03/08/2018  After extensive coaching inhaler device,  effectiveness =    90% with elipta so try Anoro daily > improved as of 06/06/2018  - 12/05/2018 reported on televisit much worse everytime stops pred while on anoro so rec change to trelegy and pred 20 ceiling/ 5 mg floor until seen  - 12/20/2018 improved but did not start trelegy and taking anoro at hs > rec trelegy q am and stop anoro, reduce pred to floor of 5mg  qod > titrated complete off as of 02/2019 and gradually worse by ov 03/10/2019  - 03/10/2019 rec Prednisone as Plan D = 20 mg until better then taper off if tol   - 09/30/2019 reports lowest dose of prednisone 10 mg o/w achy/cough/nause  (since 03/2019)  - 09/30/2019  After extensive coaching inhaler device,  effectiveness =    90%  -  09/30/2019   Walked RA  2 laps @ approx 268ft each @ slow pace  stopped due to end of study, tured > sob and sats still 96% - 09/30/2019 rec wixella 250 to save over trelegy until out of donut hole    - 01/05/2020 back on trelegy100 if can afford on plan as worse doe on wixella  - 07/08/2020  After extensive coaching inhaler device,  effectiveness =    90% with dpi, 80% with hfa    Group D in terms of symptom/risk and laba/lama/ICS  therefore appropriate rx at this point >>>  Continue trelegy with prednisone as plan D

## 2021-02-10 NOTE — Assessment & Plan Note (Signed)
First detected 02/22/2018 vs baseline study 11/21/17 so rx levaquin 500 x 10 days  -  03/25/2018  See ov >>> HRCT rec p 6 d of pred and 10 d augmentin  For ? Superimposed pna  All changes now appear chronic with no evidence of any form of progressive ILD   F/u q 6 m, sooner if needed          Each maintenance medication was reviewed in detail including emphasizing most importantly the difference between maintenance and prns and under what circumstances the prns are to be triggered using an action plan format where appropriate.  Total time for H and P, chart review, counseling, reviewing dpi device(s) and generating customized AVS unique to this office visit / same day charting = 20 min

## 2021-04-01 ENCOUNTER — Other Ambulatory Visit: Payer: Self-pay | Admitting: Internal Medicine

## 2021-04-05 DIAGNOSIS — N3000 Acute cystitis without hematuria: Secondary | ICD-10-CM | POA: Diagnosis not present

## 2021-04-19 ENCOUNTER — Encounter: Payer: Self-pay | Admitting: Internal Medicine

## 2021-04-19 ENCOUNTER — Ambulatory Visit (INDEPENDENT_AMBULATORY_CARE_PROVIDER_SITE_OTHER): Payer: Medicare HMO | Admitting: Internal Medicine

## 2021-04-19 DIAGNOSIS — R7302 Impaired glucose tolerance (oral): Secondary | ICD-10-CM

## 2021-04-19 DIAGNOSIS — I10 Essential (primary) hypertension: Secondary | ICD-10-CM

## 2021-04-19 DIAGNOSIS — N1831 Chronic kidney disease, stage 3a: Secondary | ICD-10-CM

## 2021-04-19 DIAGNOSIS — L03116 Cellulitis of left lower limb: Secondary | ICD-10-CM

## 2021-04-19 MED ORDER — CEPHALEXIN 500 MG PO CAPS: 500.0000 mg | ORAL_CAPSULE | Freq: Three times a day (TID) | ORAL | 0 refills | Status: DC

## 2021-04-19 NOTE — Patient Instructions (Signed)
Please take all new medication as prescribed - the antibiotic  Please continue all other medications as before, and refills have been done if requested.  Please have the pharmacy call with any other refills you may need.  Please continue your efforts at being more active, low cholesterol diet, and weight control.  Please keep your appointments with your specialists as you may have planned    

## 2021-04-19 NOTE — Progress Notes (Signed)
Patient ID: Justin Walters, male   DOB: Nov 22, 1940, 81 y.o.   MRN: [id number] ? ? ? ?    Chief Complaint: follow up left leg infection ? ?     HPI:  Justin Walters is a 81 y.o. male here after a stumble in furniture with a small superficial laceration to the mid left distal leg, did not think much of it at the time 3 days ago, but today with marked increased red, swelling, tender without red streaks, fever, chills, or drainage.  No high fever or chills though has felt some warm.  Pt denies chest pain, increased sob or doe, wheezing, orthopnea, PND, increased LE swelling, palpitations, dizziness or syncope.   Pt denies polydipsia, polyuria, or new focal neuro s/s.   Pt denies recent wt loss, night sweats, loss of appetite, or other constitutional symptoms  ?      ?Wt Readings from Last 3 Encounters:  ?04/19/21 165 lb (74.8 kg)  ?02/09/21 169 lb 12.8 oz (77 kg)  ?01/06/21 172 lb (78 kg)  ? ?BP Readings from Last 3 Encounters:  ?04/19/21 136/68  ?02/09/21 122/62  ?01/06/21 118/70  ? ?      ?Past Medical History:  ?Diagnosis Date  ? At risk for sleep apnea   ? STOP-BANG= 5    SENT TO PCP 06-13-2013  ? Bladder stone   ? BPH (benign prostatic hyperplasia)   ? COPD (chronic obstructive pulmonary disease) ([place])   ? Diverticulosis of colon   ? Dysuria   ? Glucose intolerance (impaired glucose tolerance)   ? History of kidney stones   ? Hyperlipidemia   ? Hypertension   ? Ureteral disorder 2018  ? ureteral repair  ? ?Past Surgical History:  ?Procedure Laterality Date  ? APPENDECTOMY  1980's  ? CATARACT EXTRACTION W/ INTRAOCULAR LENS  IMPLANT, BILATERAL    ? COLONOSCOPY  10-05-2003  ? tics only   ? CYSTOSCOPY N/A 06/23/2013  ? Procedure: CYSTOSCOPY WITH LITHOLAPAXY, BLADDER EXPLORATION, REPAIR BLADDER PERFORATION;  Surgeon: Arvil Persons, MD;  Location: [facility];  Service: Urology;  Laterality: N/A;  ? CYSTOSCOPY/URETEROSCOPY/HOLMIUM LASER/STENT PLACEMENT Left 04/03/2017  ? Procedure: CYSTOSCOPY LEFT RETROGRADE/LEFT  /URETEROSCOPY/HOLMIUM LASER/BASKET STONE EXTRACTION/STENT PLACEMENT/ URETHRAL BIOPSY;  Surgeon: [name], MD;  Location: [facility];  Service: Urology;  Laterality: Left;  ? EXTRACORPOREAL SHOCK WAVE LITHOTRIPSY Left 10/23/2016  ? Procedure: LEFT EXTRACORPOREAL SHOCK WAVE LITHOTRIPSY (ESWL);  Surgeon: Nickie Retort, MD;  Location: [facility];  Service: Urology;  Laterality: Left;  ? EXTRACORPOREAL SHOCK WAVE LITHOTRIPSY Left 01/08/2017  ? Procedure: LEFT EXTRACORPOREAL SHOCK WAVE LITHOTRIPSY (ESWL);  Surgeon: [name], MD;  Location: [facility];  Service: Urology;  Laterality: Left;  ? HOLMIUM LASER APPLICATION N/A 7/74/1287  ? Procedure: HOLMIUM LASER APPLICATION;  Surgeon: Arvil Persons, MD;  Location: [facility];  Service: Urology;  Laterality: N/A;  ? LAPAROSCOPIC INGUINAL HERNIA REPAIR Bilateral 10-09-2003  ? TRANSURETHRAL RESECTION OF PROSTATE N/A 06/23/2013  ? Procedure: TRANSURETHRAL RESECTION OF THE PROSTATE;  Surgeon: Arvil Persons, MD;  Location: [facility];  Service: Urology;  Laterality: N/A;  ? ? reports that he quit smoking about 28 years ago. His smoking use included cigarettes. He has a 102.00 pack-year smoking history. He has never used smokeless tobacco. He reports that he does not drink alcohol and does not use drugs. ?family history includes COPD in his mother; Cancer in his father; Emphysema in his brother; Heart failure in his brother. ?  No Known Allergies ?Current Outpatient Medications on File Prior to Visit  ?Medication Sig Dispense Refill  ? acetaminophen (TYLENOL) 325 MG tablet Take 650 mg by mouth every 6 (six) hours as needed for mild pain or headache.    ? albuterol (PROVENTIL HFA;VENTOLIN HFA) 108 (90 Base) MCG/ACT inhaler INHALE 2 PUFFS INTO THE LUNGS EVERY 6 (SIX) HOURS AS NEEDED FOR WHEEZING OR SHORTNESS OF BREATH. 54 g 3  ? aspirin 81 MG EC tablet Take 1 tablet (81 mg total) by mouth daily. Swallow whole. 30 tablet 12  ? cholecalciferol (VITAMIN  D3) 25 MCG (1000 UNIT) tablet Take 2,000 Units by mouth daily.    ? dapagliflozin propanediol (FARXIGA) 5 MG TABS tablet Take 1 tablet (5 mg total) by mouth daily before breakfast. 90 tablet 3  ? diphenhydrAMINE (BENADRYL) 25 MG tablet Take 50 mg by mouth every 6 (six) hours as needed for allergies.    ? famotidine (PEPCID) 20 MG tablet Take 20 mg by mouth daily.    ? Fluticasone-Umeclidin-Vilant (TRELEGY ELLIPTA) 100-62.5-25 MCG/ACT AEPB Inhale 1 puff into the lungs daily. 1 each 11  ? gabapentin (NEURONTIN) 300 MG capsule TAKE 1 CAPSULE (300 MG TOTAL) BY MOUTH 3 (THREE) TIMES DAILY. 270 capsule 1  ? Guaifenesin 1200 MG TB12 Take 1 tablet by mouth 2 (two) times daily as needed (congestion).    ? pantoprazole (PROTONIX) 40 MG tablet TAKE 1 TABLET ONCE DAILY 30 TO 60  MINUTES  BEFORE  FIRST  MEAL  OF  THE  DAY 90 tablet 1  ? Respiratory Therapy Supplies (FLUTTER) DEVI Use as directed 1 each 0  ? rosuvastatin (CRESTOR) 20 MG tablet TAKE 1 TABLET EVERY DAY 90 tablet 0  ? tamsulosin (FLOMAX) 0.4 MG CAPS capsule Take 0.4 mg by mouth daily.    ? vitamin B-12 (CYANOCOBALAMIN) 1000 MCG tablet Take 1 tablet (1,000 mcg total) by mouth daily. 90 tablet 1  ? ?No current facility-administered medications on file prior to visit.  ? ?     ROS:  All others reviewed and negative. ? ?Objective  ? ?     PE:  BP 136/68 (BP Location: Right Arm, Patient Position: Sitting, Cuff Size: Large)   Pulse 81   Temp 98.1 ?F (36.7 ?C) (Oral)   Ht '5\' 7"'$  (1.702 m)   Wt 165 lb (74.8 kg)   SpO2 100%   BMI 25.84 kg/m?  ? ?              Constitutional: Pt appears in NAD ?              HENT: Head: NCAT.  ?              Right Ear: External ear normal.   ?              Left Ear: External ear normal.  ?              Eyes: . Pupils are equal, round, and reactive to light. Conjunctivae and EOM are normal ?              Nose: without d/c or deformity ?              Neck: Neck supple. Gross normal ROM ?              Cardiovascular: Normal rate and regular  rhythm.   ?              Pulmonary/Chest: Effort normal  and breath sounds without rales or wheezing.  ?              Abd:  Soft, NT, ND, + BS, no organomegaly ?              Neurological: Pt is alert. At baseline orientation, motor grossly intact ?              Skin: mid distal LLE with 3 cm area mild red, tender, swelling with central small lacertaion without red streaks or drainage ?              Psychiatric: Pt behavior is normal without agitation  ? ?Micro: none ? ?Cardiac tracings I have personally interpreted today:  none ? ?Pertinent Radiological findings (summarize): none  ? ?Lab Results  ?Component Value Date  ? WBC 7.6 01/04/2021  ? HGB 13.9 01/04/2021  ? HCT 44.0 01/04/2021  ? PLT 194.0 01/04/2021  ? GLUCOSE 97 01/04/2021  ? CHOL 157 01/04/2021  ? TRIG 54.0 01/04/2021  ? HDL 77.60 01/04/2021  ? LDLDIRECT 161.9 03/28/2012  ? [place] 69 01/04/2021  ? ALT 11 01/04/2021  ? AST 10 01/04/2021  ? NA 141 01/04/2021  ? K 5.0 01/04/2021  ? CL 104 01/04/2021  ? CREATININE 1.51 (H) 01/04/2021  ? BUN 21 01/04/2021  ? CO2 27 01/04/2021  ? TSH 1.95 01/04/2021  ? PSA 0.67 01/04/2021  ? HGBA1C 6.7 (H) 01/04/2021  ? ?Assessment/Plan:  ?KIANTE CIAVARELLA is a 81 y.o. White or Caucasian [1] male with  has a past medical history of At risk for sleep apnea, Bladder stone, BPH (benign prostatic hyperplasia), COPD (chronic obstructive pulmonary disease) ([place]), Diverticulosis of colon, Dysuria, Glucose intolerance (impaired glucose tolerance), History of kidney stones, Hyperlipidemia, Hypertension, and Ureteral disorder (2018). ? ?Left leg cellulitis ?Mild to mod, for antibx course,  to f/u any worsening symptoms or concerns ? ?Impaired glucose tolerance ?Lab Results  ?Component Value Date  ? HGBA1C 6.7 (H) 01/04/2021  ? ?Mild uncontrolled a1c,, pt to continue current medical treatment farxiga as declines change ? ?Essential hypertension ?BP Readings from Last 3 Encounters:  ?04/19/21 136/68  ?02/09/21 122/62  ?01/06/21 118/70   ? ?Stable, pt to continue medical treatment  - diet, low salt, wt control ? ? ?CKD (chronic kidney disease) stage 3, GFR 30-59 ml/min (HCC) ?Lab Results  ?Component Value Date  ? CREATININE 1.51 (H) 01/04/2021  ?

## 2021-04-23 ENCOUNTER — Encounter: Payer: Self-pay | Admitting: Internal Medicine

## 2021-04-23 DIAGNOSIS — L03116 Cellulitis of left lower limb: Secondary | ICD-10-CM | POA: Insufficient documentation

## 2021-04-23 NOTE — Assessment & Plan Note (Signed)
Lab Results  ?Component Value Date  ? CREATININE 1.51 (H) 01/04/2021  ? ?Stable overall, cont to avoid nephrotoxins ? ?

## 2021-04-23 NOTE — Assessment & Plan Note (Signed)
Mild to mod, for antibx course,  to f/u any worsening symptoms or concerns 

## 2021-04-23 NOTE — Assessment & Plan Note (Signed)
BP Readings from Last 3 Encounters:  ?04/19/21 136/68  ?02/09/21 122/62  ?01/06/21 118/70  ? ?Stable, pt to continue medical treatment  - diet, low salt, wt control ? ?

## 2021-04-23 NOTE — Assessment & Plan Note (Signed)
Lab Results  ?Component Value Date  ? HGBA1C 6.7 (H) 01/04/2021  ? ?Mild uncontrolled a1c,, pt to continue current medical treatment farxiga as declines change ?

## 2021-05-19 DIAGNOSIS — I129 Hypertensive chronic kidney disease with stage 1 through stage 4 chronic kidney disease, or unspecified chronic kidney disease: Secondary | ICD-10-CM | POA: Diagnosis not present

## 2021-05-19 DIAGNOSIS — E785 Hyperlipidemia, unspecified: Secondary | ICD-10-CM | POA: Diagnosis not present

## 2021-05-19 DIAGNOSIS — J449 Chronic obstructive pulmonary disease, unspecified: Secondary | ICD-10-CM | POA: Diagnosis not present

## 2021-05-19 DIAGNOSIS — N1832 Chronic kidney disease, stage 3b: Secondary | ICD-10-CM | POA: Diagnosis not present

## 2021-05-19 DIAGNOSIS — N400 Enlarged prostate without lower urinary tract symptoms: Secondary | ICD-10-CM | POA: Diagnosis not present

## 2021-05-19 DIAGNOSIS — N200 Calculus of kidney: Secondary | ICD-10-CM | POA: Diagnosis not present

## 2021-06-16 ENCOUNTER — Other Ambulatory Visit: Payer: Self-pay | Admitting: Internal Medicine

## 2021-06-16 DIAGNOSIS — R918 Other nonspecific abnormal finding of lung field: Secondary | ICD-10-CM

## 2021-06-27 ENCOUNTER — Other Ambulatory Visit: Payer: Self-pay | Admitting: Internal Medicine

## 2021-06-30 ENCOUNTER — Other Ambulatory Visit (INDEPENDENT_AMBULATORY_CARE_PROVIDER_SITE_OTHER): Payer: Medicare HMO

## 2021-06-30 DIAGNOSIS — J449 Chronic obstructive pulmonary disease, unspecified: Secondary | ICD-10-CM | POA: Diagnosis not present

## 2021-06-30 DIAGNOSIS — E538 Deficiency of other specified B group vitamins: Secondary | ICD-10-CM | POA: Diagnosis not present

## 2021-06-30 DIAGNOSIS — R7302 Impaired glucose tolerance (oral): Secondary | ICD-10-CM

## 2021-06-30 DIAGNOSIS — I10 Essential (primary) hypertension: Secondary | ICD-10-CM

## 2021-06-30 LAB — BASIC METABOLIC PANEL
BUN: 35 mg/dL — ABNORMAL HIGH (ref 6–23)
CO2: 25 mEq/L (ref 19–32)
Calcium: 9.3 mg/dL (ref 8.4–10.5)
Chloride: 105 mEq/L (ref 96–112)
Creatinine, Ser: 1.63 mg/dL — ABNORMAL HIGH (ref 0.40–1.50)
GFR: 39.58 mL/min — ABNORMAL LOW (ref >60.00)
Glucose, Bld: 96 mg/dL (ref 70–99)
Potassium: 4 mEq/L (ref 3.5–5.1)
Sodium: 138 mEq/L (ref 135–145)

## 2021-06-30 LAB — HEPATIC FUNCTION PANEL
ALT: 9 U/L (ref 0–53)
AST: 13 U/L (ref 0–37)
Albumin: 4.1 g/dL (ref 3.5–5.2)
Alkaline Phosphatase: 40 U/L (ref 39–117)
Bilirubin, Direct: 0.1 mg/dL (ref 0.0–0.3)
Total Bilirubin: 0.6 mg/dL (ref 0.2–1.2)
Total Protein: 7 g/dL (ref 6.0–8.3)

## 2021-06-30 LAB — LIPID PANEL
Cholesterol: 122 mg/dL (ref 0–200)
HDL: 44.4 mg/dL (ref >39.00)
LDL Cholesterol: 51 mg/dL (ref 0–99)
NonHDL: 77.33
Total CHOL/HDL Ratio: 3
Triglycerides: 134 mg/dL (ref 0.0–149.0)
VLDL: 26.8 mg/dL (ref 0.0–40.0)

## 2021-06-30 LAB — VITAMIN D 25 HYDROXY (VIT D DEFICIENCY, FRACTURES): VITD: 72.35 ng/mL (ref 30.00–100.00)

## 2021-06-30 LAB — VITAMIN B12: Vitamin B-12: 1169 pg/mL — ABNORMAL HIGH (ref 211–911)

## 2021-06-30 LAB — HEMOGLOBIN A1C: Hgb A1c MFr Bld: 6.1 % (ref 4.6–6.5)

## 2021-07-07 ENCOUNTER — Encounter: Payer: Self-pay | Admitting: Internal Medicine

## 2021-07-07 ENCOUNTER — Ambulatory Visit (INDEPENDENT_AMBULATORY_CARE_PROVIDER_SITE_OTHER): Payer: Medicare HMO | Admitting: Internal Medicine

## 2021-07-07 VITALS — BP 102/60 | HR 62 | Temp 98.4°F | Ht 67.0 in | Wt 161.4 lb

## 2021-07-07 DIAGNOSIS — R7302 Impaired glucose tolerance (oral): Secondary | ICD-10-CM

## 2021-07-07 DIAGNOSIS — E559 Vitamin D deficiency, unspecified: Secondary | ICD-10-CM | POA: Diagnosis not present

## 2021-07-07 DIAGNOSIS — I10 Essential (primary) hypertension: Secondary | ICD-10-CM

## 2021-07-07 DIAGNOSIS — E7800 Pure hypercholesterolemia, unspecified: Secondary | ICD-10-CM

## 2021-07-07 DIAGNOSIS — E538 Deficiency of other specified B group vitamins: Secondary | ICD-10-CM

## 2021-07-07 DIAGNOSIS — N1831 Chronic kidney disease, stage 3a: Secondary | ICD-10-CM | POA: Diagnosis not present

## 2021-07-07 NOTE — Assessment & Plan Note (Signed)
Last vitamin D Lab Results  Component Value Date   VD25OH 72.35 06/30/2021   Stable, cont oral replacement

## 2021-07-07 NOTE — Progress Notes (Signed)
Patient ID: Justin Walters, male   DOB: [birth year], 81 y.o.   MRN: [id number]        Chief Complaint: follow up low b12, hld, low vit d, hyperglycemia       HPI:  Justin Walters is a 81 y.o. male here overall doing ok, Pt denies chest pain, increased sob or doe, wheezing, orthopnea, PND, increased LE swelling, palpitations, dizziness or syncope.   Pt denies polydipsia, polyuria, or new focal neuro s/s.    Pt denies fever, wt loss, night sweats, loss of appetite, or other constitutional symptoms  Declines CT cardiac score testing for now but might consider in the future.  Taking B12 every day.         Wt Readings from Last 3 Encounters:  07/07/21 161 lb 6.4 oz (73.2 kg)  04/19/21 165 lb (74.8 kg)  02/09/21 169 lb 12.8 oz (77 kg)   BP Readings from Last 3 Encounters:  07/07/21 102/60  04/19/21 136/68  02/09/21 122/62         Past Medical History:  Diagnosis Date   At risk for sleep apnea    STOP-BANG= 5    SENT TO PCP 06-13-2013   Bladder stone    BPH (benign prostatic hyperplasia)    COPD (chronic obstructive pulmonary disease) (HCC)    Diverticulosis of colon    Dysuria    Glucose intolerance (impaired glucose tolerance)    History of kidney stones    Hyperlipidemia    Hypertension    Ureteral disorder 2018   ureteral repair   Past Surgical History:  Procedure Laterality Date   APPENDECTOMY  1980's   CATARACT EXTRACTION W/ INTRAOCULAR LENS  IMPLANT, BILATERAL     COLONOSCOPY  10-05-2003   tics only    CYSTOSCOPY N/A 06/23/2013   Procedure: CYSTOSCOPY WITH LITHOLAPAXY, BLADDER EXPLORATION, REPAIR BLADDER PERFORATION;  Surgeon: Arvil Persons, MD;  Location: [facility];  Service: Urology;  Laterality: N/A;   CYSTOSCOPY/URETEROSCOPY/HOLMIUM LASER/STENT PLACEMENT Left 04/03/2017   Procedure: CYSTOSCOPY LEFT RETROGRADE/LEFT /URETEROSCOPY/HOLMIUM LASER/BASKET STONE EXTRACTION/STENT PLACEMENT/ URETHRAL BIOPSY;  Surgeon: [name], MD;  Location: [facility];  Service:  Urology;  Laterality: Left;   EXTRACORPOREAL SHOCK WAVE LITHOTRIPSY Left 10/23/2016   Procedure: LEFT EXTRACORPOREAL SHOCK WAVE LITHOTRIPSY (ESWL);  Surgeon: Nickie Retort, MD;  Location: [facility];  Service: Urology;  Laterality: Left;   EXTRACORPOREAL SHOCK WAVE LITHOTRIPSY Left 01/08/2017   Procedure: LEFT EXTRACORPOREAL SHOCK WAVE LITHOTRIPSY (ESWL);  Surgeon: [name], MD;  Location: [facility];  Service: Urology;  Laterality: Left;   HOLMIUM LASER APPLICATION N/A 04/10/8117   Procedure: HOLMIUM LASER APPLICATION;  Surgeon: Arvil Persons, MD;  Location: [facility];  Service: Urology;  Laterality: N/A;   LAPAROSCOPIC INGUINAL HERNIA REPAIR Bilateral 10-09-2003   TRANSURETHRAL RESECTION OF PROSTATE N/A 06/23/2013   Procedure: TRANSURETHRAL RESECTION OF THE PROSTATE;  Surgeon: Arvil Persons, MD;  Location: [facility];  Service: Urology;  Laterality: N/A;    reports that he quit smoking about 28 years ago. His smoking use included cigarettes. He has a 102.00 pack-year smoking history. He has never used smokeless tobacco. He reports that he does not drink alcohol and does not use drugs. family history includes COPD in his mother; Cancer in his father; Emphysema in his brother; Heart failure in his brother. No Known Allergies Current Outpatient Medications on File Prior to Visit  Medication Sig Dispense Refill   acetaminophen (TYLENOL) 325 MG tablet Take 650  mg by mouth every 6 (six) hours as needed for mild pain or headache.     albuterol (PROVENTIL HFA;VENTOLIN HFA) 108 (90 Base) MCG/ACT inhaler INHALE 2 PUFFS INTO THE LUNGS EVERY 6 (SIX) HOURS AS NEEDED FOR WHEEZING OR SHORTNESS OF BREATH. 54 g 3   aspirin 81 MG EC tablet Take 1 tablet (81 mg total) by mouth daily. Swallow whole. 30 tablet 12   cholecalciferol (VITAMIN D3) 25 MCG (1000 UNIT) tablet Take 2,000 Units by mouth daily.     diphenhydrAMINE (BENADRYL) 25 MG tablet Take 50 mg by mouth every 6 (six) hours  as needed for allergies.     famotidine (PEPCID) 20 MG tablet Take 20 mg by mouth daily.     Fluticasone-Umeclidin-Vilant (TRELEGY ELLIPTA) 100-62.5-25 MCG/ACT AEPB Inhale 1 puff into the lungs daily. 1 each 11   gabapentin (NEURONTIN) 300 MG capsule TAKE 1 CAPSULE (300 MG TOTAL) BY MOUTH 3 (THREE) TIMES DAILY. 270 capsule 1   Guaifenesin 1200 MG TB12 Take 1 tablet by mouth 2 (two) times daily as needed (congestion).     pantoprazole (PROTONIX) 40 MG tablet TAKE 1 TABLET ONCE DAILY 30 TO 60 MINUTES BEFORE  FIRST MEAL OF THE DAY 90 tablet 1   Respiratory Therapy Supplies (FLUTTER) DEVI Use as directed 1 each 0   rosuvastatin (CRESTOR) 20 MG tablet TAKE 1 TABLET EVERY DAY 90 tablet 0   tamsulosin (FLOMAX) 0.4 MG CAPS capsule Take 0.4 mg by mouth daily.     vitamin B-12 (CYANOCOBALAMIN) 1000 MCG tablet Take 1 tablet (1,000 mcg total) by mouth daily. 90 tablet 1   dapagliflozin propanediol (FARXIGA) 5 MG TABS tablet Take 1 tablet (5 mg total) by mouth daily before breakfast. (Patient not taking: Reported on 07/07/2021) 90 tablet 3   No current facility-administered medications on file prior to visit.        ROS:  All others reviewed and negative.  Objective        PE:  BP 102/60 (BP Location: Right Arm, Patient Position: Sitting, Cuff Size: Large)   Pulse 62   Temp 98.4 F (36.9 C) (Oral)   Ht '5\' 7"'$  (1.702 m)   Wt 161 lb 6.4 oz (73.2 kg)   SpO2 96%   BMI 25.28 kg/m                 Constitutional: Pt appears in NAD               HENT: Head: NCAT.                Right Ear: External ear normal.                 Left Ear: External ear normal.                Eyes: . Pupils are equal, round, and reactive to light. Conjunctivae and EOM are normal               Nose: without d/c or deformity               Neck: Neck supple. Gross normal ROM               Cardiovascular: Normal rate and regular rhythm.                 Pulmonary/Chest: Effort normal and breath sounds without rales or wheezing.                 Abd:  Soft, NT, ND, + BS, no organomegaly               Neurological: Pt is alert. At baseline orientation, motor grossly intact               Skin: Skin is warm. No rashes, no other new lesions, LE edema - none               Psychiatric: Pt behavior is normal without agitation   Micro: none  Cardiac tracings I have personally interpreted today:  none  Pertinent Radiological findings (summarize): none   Lab Results  Component Value Date   WBC 7.6 01/04/2021   HGB 13.9 01/04/2021   HCT 44.0 01/04/2021   PLT 194.0 01/04/2021   GLUCOSE 96 06/30/2021   CHOL 122 06/30/2021   TRIG 134.0 06/30/2021   HDL 44.40 06/30/2021   LDLDIRECT 161.9 03/28/2012   LDLCALC 51 06/30/2021   ALT 9 06/30/2021   AST 13 06/30/2021   NA 138 06/30/2021   K 4.0 06/30/2021   CL 105 06/30/2021   CREATININE 1.63 (H) 06/30/2021   BUN 35 (H) 06/30/2021   CO2 25 06/30/2021   TSH 1.95 01/04/2021   PSA 0.67 01/04/2021   HGBA1C 6.1 06/30/2021   Assessment/Plan:  Justin Walters is a 81 y.o. White or Caucasian [1] male with  has a past medical history of At risk for sleep apnea, Bladder stone, BPH (benign prostatic hyperplasia), COPD (chronic obstructive pulmonary disease) ([place]), Diverticulosis of colon, Dysuria, Glucose intolerance (impaired glucose tolerance), History of kidney stones, Hyperlipidemia, Hypertension, and Ureteral disorder (2018).  Vitamin D deficiency Last vitamin D Lab Results  Component Value Date   VD25OH 72.35 06/30/2021   Stable, cont oral replacement   Vitamin B12 deficiency Lab Results  Component Value Date   VITAMINB12 1,169 (H) 06/30/2021   overcontrolled, pt to reduce oral replacement - b12 1000 mcg to qod  Impaired glucose tolerance Lab Results  Component Value Date   HGBA1C 6.1 06/30/2021   Stable, pt to continue current medical treatment  - diet, wt control, excercise  Hyperlipidemia Lab Results  Component Value Date   LDLCALC 51 06/30/2021   Stable,  pt to continue current statin crestor 20 mg qd   Essential hypertension BP Readings from Last 3 Encounters:  07/07/21 102/60  04/19/21 136/68  02/09/21 122/62   Stable, pt to continue medical treatment  - diet, wt control, excercise   CKD (chronic kidney disease) stage 3, GFR 30-59 ml/min (HCC) Lab Results  Component Value Date   CREATININE 1.63 (H) 06/30/2021   Stable overall, cont to avoid nephrotoxins  Followup: No follow-ups on file.  Cathlean Cower, MD 07/08/2021 9:20 PM [facility]

## 2021-07-07 NOTE — Assessment & Plan Note (Addendum)
Lab Results  Component Value Date   VITAMINB12 1,169 (H) 06/30/2021   overcontrolled, pt to reduce oral replacement - b12 1000 mcg to qod

## 2021-07-07 NOTE — Patient Instructions (Addendum)
Ok to cut back on the B12 to every other day  In case you are not able to get the [place], please check with your insurance about:  Antonietta [place], or even [place]  Please continue all other medications as before, and refills have been done if requested.  Please have the pharmacy call with any other refills you may need.  Please continue your efforts at being more active, low cholesterol diabetic diet, and weight control.  Please keep your appointments with your specialists as you may have planned  Please call if you change your mind about having the Cardiac CT score to check for blockages in the heart arteries  Please make an Appointment to return in 6 months, or sooner if needed, also with Lab Appointment for testing done 3-5 days before at the [facility] (so this is for TWO appointments - please see the scheduling desk as you leave)

## 2021-07-08 NOTE — Assessment & Plan Note (Signed)
BP Readings from Last 3 Encounters:  07/07/21 102/60  04/19/21 136/68  02/09/21 122/62   Stable, pt to continue medical treatment  - diet, wt control, excercise

## 2021-07-08 NOTE — Assessment & Plan Note (Signed)
Lab Results  Component Value Date   LDLCALC 51 06/30/2021   Stable, pt to continue current statin crestor 20 mg qd

## 2021-07-08 NOTE — Assessment & Plan Note (Signed)
Lab Results  Component Value Date   HGBA1C 6.1 06/30/2021   Stable, pt to continue current medical treatment  - diet, wt control, excercise

## 2021-07-08 NOTE — Assessment & Plan Note (Signed)
Lab Results  Component Value Date   CREATININE 1.63 (H) 06/30/2021   Stable overall, cont to avoid nephrotoxins

## 2021-07-14 DIAGNOSIS — R3912 Poor urinary stream: Secondary | ICD-10-CM | POA: Diagnosis not present

## 2021-07-14 DIAGNOSIS — N401 Enlarged prostate with lower urinary tract symptoms: Secondary | ICD-10-CM | POA: Diagnosis not present

## 2021-08-09 ENCOUNTER — Encounter: Payer: Self-pay | Admitting: Internal Medicine

## 2021-08-09 ENCOUNTER — Ambulatory Visit: Payer: Medicare HMO | Admitting: Internal Medicine

## 2021-08-09 DIAGNOSIS — J449 Chronic obstructive pulmonary disease, unspecified: Secondary | ICD-10-CM

## 2021-08-09 MED ORDER — FLUTICASONE-SALMETEROL 250-50 MCG/ACT IN AEPB: 1.0000 | INHALATION_SPRAY | Freq: Two times a day (BID) | RESPIRATORY_TRACT | Status: DC

## 2021-08-09 NOTE — Patient Instructions (Signed)
No change in medications  See your eye doctor about your eye symptoms   Please schedule a follow up visit in 6 months but call sooner if needed

## 2021-08-09 NOTE — Progress Notes (Signed)
Subjective:     Patient ID: Justin Walters, male   DOB: 02-Mar-1940,     MRN: [id number]    Brief patient profile:  52   yowm MM quit smoking 1995 with cough/wheeze/ sob and never recovered with documented GOLD III copd 02/23/2017      History of Present Illness  01/25/2017 1st  office visit/ [name]   GOLD III with reversibility  Chief Complaint  Patient presents with   Pulmonary Consult    Referred by the hospital. He c/o SOB and coughing for the past several wks. His cough is occ prod, but he is unsure of sputum color. He is SOB with exertion and also when he lies down.  He gets winded walking approx 100 yards. He is using his albuterol inhaler 3 x per wk on average.   baseline doe across the street to son's house  nl pace and then onset   x one year gradually worse doe and no better on inhalers but poor baseline hfa/ assoc with cough / sense of chest congestion and has to prop up at 30 degrees now at hs to get comfortable Present doe = MMRC3 = can't walk 100 yards even at a slow pace at a flat grade s stopping due to sob  Easily confused with details of care/ names of meds / not using spiriva/ was better on pred and worse since tapered rec Plan A = Automatic =  symbicort 160 Take 2 puffs first thing in am and then another 2 puffs about 12 hours later.  Work on inhaler technique:  Plan B = Backup Only use your albuterol  as a rescue  Prednisone 10 mg take  4 each am x 2 days,   2 each am x 2 days,  1 each am x 2 days and stop     02/09/2021  f/u ov/[name] re: GOLD  3  maint on trelelgy   Chief Complaint  Patient presents with   Follow-up    Sob-slightly better,occass. Cough-milky white, wheezing  Dyspnea:  still walking to son's house, yardwork  Cough: minimal in am / mucoid   Sleeping: 6 in bed blocks/ no resp cc  SABA use: rarely  02: none  Covid status:  vax x 3  no bivalent  Rec No change in medications    08/09/2021  f/u ov/[name] re: GOLD 3 copd   maint on Advair 250   Chief Complaint   Patient presents with   Follow-up    No new issues since LOV.   Dyspnea:  no problem fast pace to son's house  Cough: none  Sleeping: bed blocks/ one pillow no resp cc  SABA use: none / and no recent prednisone / no need for neb      No obvious day to day or daytime variability or assoc excess/ purulent sputum or mucus plugs or hemoptysis or cp or chest tightness, subjective wheeze or overt sinus or hb symptoms.   Sleeping  without nocturnal  or early am exacerbation  of respiratory  c/o's or need for noct saba. Also denies any obvious fluctuation of symptoms with weather or environmental changes or other aggravating or alleviating factors except as outlined above   No unusual exposure hx or h/o childhood pna/ asthma or knowledge of premature birth.  Current Allergies, Complete Past Medical History, Past Surgical History, Family History, and Social History were reviewed in [employer] record.  ROS  The following are not active complaints unless bolded  Hoarseness, sore throat, dysphagia, dental problems, itching, sneezing,  nasal congestion or discharge of excess mucus or purulent secretions, ear ache,   fever, chills, sweats, unintended wt loss or wt gain, classically pleuritic or exertional cp,  orthopnea pnd or arm/hand swelling  or leg swelling, presyncope, palpitations, abdominal pain, anorexia, nausea, vomiting, diarrhea  or change in bowel habits or change in bladder habits, change in stools or change in urine, dysuria, hematuria,  rash, arthralgias, visual complaints eyes= get tired and watery no change off the trelegy , headache, numbness, weakness or ataxia or problems with walking or coordination,  change in mood or  memory.        Current Meds  Medication Sig   acetaminophen (TYLENOL) 325 MG tablet Take 650 mg by mouth every 6 (six) hours as needed for mild pain or headache.   albuterol (PROVENTIL HFA;VENTOLIN HFA) 108 (90 Base) MCG/ACT inhaler INHALE 2  PUFFS INTO THE LUNGS EVERY 6 (SIX) HOURS AS NEEDED FOR WHEEZING OR SHORTNESS OF BREATH.   aspirin 81 MG EC tablet Take 1 tablet (81 mg total) by mouth daily. Swallow whole.   cholecalciferol (VITAMIN D3) 25 MCG (1000 UNIT) tablet Take 2,000 Units by mouth daily.   dapagliflozin propanediol (FARXIGA) 5 MG TABS tablet Take 1 tablet (5 mg total) by mouth daily before breakfast.   diphenhydrAMINE (BENADRYL) 25 MG tablet Take 50 mg by mouth every 6 (six) hours as needed for allergies.   famotidine (PEPCID) 20 MG tablet Take 20 mg by mouth daily.   gabapentin (NEURONTIN) 300 MG capsule TAKE 1 CAPSULE (300 MG TOTAL) BY MOUTH 3 (THREE) TIMES DAILY.   Guaifenesin 1200 MG TB12 Take 1 tablet by mouth 2 (two) times daily as needed (congestion).   pantoprazole (PROTONIX) 40 MG tablet TAKE 1 TABLET ONCE DAILY 30 TO 60 MINUTES BEFORE  FIRST MEAL OF THE DAY   Respiratory Therapy Supplies (FLUTTER) DEVI Use as directed   rosuvastatin (CRESTOR) 20 MG tablet TAKE 1 TABLET EVERY DAY   tamsulosin (FLOMAX) 0.4 MG CAPS capsule Take 0.4 mg by mouth daily.   vitamin B-12 (CYANOCOBALAMIN) 1000 MCG tablet Take 1 tablet (1,000 mcg total) by mouth daily.                      Objective:   Physical Exam   Wt  08/09/2021          161  02/09/2021          169 07/08/2020          170  01/05/2020         179 09/30/2019        176  06/11/2019          180  03/10/2019          178 09/10/2018          174  06/06/2018          171  03/25/2018       155  03/08/2018         149   01/25/2018       156  12/05/2017       157  11/21/2017     157  05/24/2017        164    01/25/17 150 lb 12.8 oz (68.4 kg)  01/18/17 150 lb (68 kg)  01/08/17 150 lb (68 kg)     Vital signs reviewed  08/09/2021  - Note at rest 02 sats  98% on RA   General appearance:   amb wm nad mild pseudowheeze better with PLM   HEENT :  Oropharynx  nl   Nasal turbinates nl    NECK :  without JVD/Nodes/TM/ nl carotid upstrokes bilaterally   LUNGS: no acc muscle use,   Mod barrel  contour chest wall with bilateral  Distant lat exp   wheeze and  without cough on insp or exp maneuvers and mod  Hyperresonant  to  percussion bilaterally     CV:  RRR  no s3 or murmur or increase in P2, and no edema   ABD:  soft and nontender with pos mid insp Hoover's  in the supine position. No bruits or organomegaly appreciated, bowel sounds nl  MS:   Ext warm without deformities or   obvious joint restrictions , calf tenderness, cyanosis or clubbing  SKIN: warm and dry without lesions    NEURO:  alert, approp, nl sensorium with  no motor or cerebellar deficits apparent.                   Assessment:

## 2021-08-09 NOTE — Assessment & Plan Note (Signed)
Quit smoking 1995/MM PFT's  01/20/2017  FEV1 1.05 (39 % ) ratio 54  p 18 % improvement from saba p ? prior to study with DLCO  50/53  % corrects to 77 % for alv volume  - 01/25/2017   rec increase symb to 160 2bid trial  - alpha one AT screening  01/18/17   MM  Level 190 PFT's  02/23/2017  FEV1 1.17 (44 % ) ratio 54  p 0 % improvement from saba p symb 160 prior to study with DLCO   39 % corrects to 59 % for alv volume  - 02/23/2017    add tudorza trial > no change and could not afford - 12/05/2017 flutter valve added   - 02/22/2018    > restart symb 160 2bid - 03/08/2018  After extensive coaching inhaler device,  effectiveness =    90% with elipta so try Anoro daily > improved as of 06/06/2018  - 12/05/2018 reported on televisit much worse everytime stops pred while on anoro so rec change to trelegy and pred 20 ceiling/ 5 mg floor until seen  - 12/20/2018 improved but did not start trelegy and taking anoro at hs > rec trelegy q am and stop anoro, reduce pred to floor of '5mg'$  qod > titrated complete off as of 02/2019 and gradually worse by ov 03/10/2019  - 03/10/2019 rec Prednisone as Plan D = 20 mg until better then taper off if tol   - 09/30/2019 reports lowest dose of prednisone 10 mg o/w achy/cough/nause  (since 03/2019)  - 09/30/2019  After extensive coaching inhaler device,  effectiveness =    90%  -  09/30/2019   Walked RA  2 laps @ approx 230f each @ slow pace  stopped due to end of study, tured > sob and sats still 96% - 09/30/2019 rec wixella 250 to save over trelegy until out of donut hole    - 01/05/2020 back on trelegy100 if can afford on plan as worse doe on wixella  - 07/08/2020  After extensive coaching inhaler device,  effectiveness =    90% with dpi, 80% with hfa  - 08/09/2021 reported ocular symptoms of "eyes tired and watery" so stopped trelegy s change in resp or ocular symptoms > rec opth eval but leave off trelegy and maint on advair 250/ pred x 6 d prn    Group D (now reclassified as E) in terms of  symptom/risk and laba/lama/ICS  therefore appropriate rx at this point >>>  advair reasonable to avoid LAMA in this setting though I doubt that's what caused the ocular symptoms (which are not of blurred viz or dry eye nature as above) but rec thorough eye eval before rechallenging, esp since doing fine off LAMA.  Discussed in detail all the  indications, usual  risks and alternatives  relative to the benefits with patient who agrees to proceed with Rx as outlined.             Each maintenance medication was reviewed in detail including emphasizing most importantly the difference between maintenance and prns and under what circumstances the prns are to be triggered using an action plan format where appropriate.  Total time for H and P, chart review, counseling, reviewing hfa/dpi  device(s) and generating customized AVS unique to this office visit / same day charting = 21 min

## 2021-08-24 ENCOUNTER — Ambulatory Visit: Payer: Self-pay | Admitting: Licensed Clinical Social Worker

## 2021-08-24 NOTE — Patient Instructions (Signed)
Visit Information  Thank you for taking time to visit with me today. Please don't hesitate to contact me if I can be of assistance to you before our next scheduled telephone appointment.  Following are the goals we discussed today:   No further intervention is needed at present  Please call the care guide team at [phone] if you need to cancel or reschedule your appointment.   If you are experiencing a Mental Health or Star or need someone to talk to, please go to [facility] [place] [phone])   Following is a copy of your full plan of care:   Care Coordination Interventions:  Active listening / Reflection utilized  Described Care Coordination program services to client Client and LCSW discussed client needs Client declined program services at this time   Mr. Mcgillicuddy was given information about Care Management services by the embedded care coordination team including:  Care Management services include personalized support from designated clinical staff supervised by his physician, including individualized plan of care and coordination with other care providers 24/7 contact phone numbers for assistance for urgent and routine care needs. The patient may stop CCM services at any time (effective at the end of the month) by phone call to the office staff.  Patient agreed to services and verbal consent obtained.   Norva Riffle.Yeslin Delio MSW, [place] [employer] representative [facility] Care Management [phone]

## 2021-08-24 NOTE — Patient Outreach (Signed)
  Care Coordination   Initial Visit Note   08/24/2021 Name: Justin Walters MRN: [id number] DOB: [birth year]  Justin Walters is a 81 y.o. year old male who sees Justin Borg, MD for primary care. I spoke with  Justin Walters by phone today  What matters to the patients health and wellness today? Patient declined program services    Goals Addressed             This Visit's Progress    patient declined program services       Care Coordination Interventions:  Active listening / Reflection utilized  Described Care Coordination program services to client Client and LCSW discussed client needs Client declined program services at this time     SDOH assessments and interventions completed:  No    Care Coordination Interventions Activated:  No  Care Coordination Interventions:  No, not indicated   Follow up plan: No further intervention required.   Encounter Outcome:  Pt. Visit Completed

## 2021-10-07 ENCOUNTER — Telehealth: Payer: Self-pay | Admitting: Internal Medicine

## 2021-10-07 NOTE — Telephone Encounter (Signed)
I dont know this information.   Ok to contact pt to find out.  thanks

## 2021-10-07 NOTE — Telephone Encounter (Signed)
Patient needs a new script for his oxygen - Please script to [place] what liter flow  patient is on, if he is continuous or noctornal and what equipment he had with previous supplier.  Please fax this to [phone]

## 2021-10-12 NOTE — Telephone Encounter (Signed)
Called patient to get more information and he states that he is not on oxygen. I tried calling at [place] at the number provided which turned out to be for the medical alert system. Unable to contact her at this time by the number that was provided.

## 2021-10-12 NOTE — Telephone Encounter (Signed)
Ok to hold on further home o2 rx as pt states is not using this

## 2021-10-13 ENCOUNTER — Ambulatory Visit (INDEPENDENT_AMBULATORY_CARE_PROVIDER_SITE_OTHER): Payer: Medicare HMO

## 2021-10-13 VITALS — Ht 67.0 in

## 2021-10-13 DIAGNOSIS — Z0000 Encounter for general adult medical examination without abnormal findings: Secondary | ICD-10-CM | POA: Diagnosis not present

## 2021-10-13 NOTE — Progress Notes (Addendum)
Virtual Visit via Telephone Note  I connected with  Justin Walters on 10/13/21 at  4:00 PM EDT by telephone and verified that I am speaking with the correct person using two identifiers.  Location: Patient: Home Provider: [place] Persons participating in the virtual visit: [place]   I discussed the limitations, risks, security and privacy concerns of performing an evaluation and management service by telephone and the availability of in person appointments. The patient expressed understanding and agreed to proceed.  Interactive audio and video telecommunications were attempted between this nurse and patient, however failed, due to patient having technical difficulties OR patient did not have access to video capability.  We continued and completed visit with audio only.  Some vital signs may be absent or patient reported.   Sheral Flow, LPN  Subjective:   Justin Walters is a 81 y.o. male who presents for Medicare Annual/Subsequent preventive examination.  Review of Systems     Cardiac Risk Factors include: advanced age (>76mn, >>25women);dyslipidemia;family history of premature cardiovascular disease;hypertension;male gender;sedentary lifestyle     Objective:    Today's Vitals   10/13/21 1611  Height: '5\' 7"'$  (1.702 m)  PainSc: 0-No pain   Body mass index is 25.34 kg/m.     10/13/2021    4:05 PM 10/08/2020    4:04 PM 01/29/2020    2:41 AM 07/03/2019    8:23 AM 03/26/2018    2:00 PM 03/30/2017   10:10 AM 01/08/2017    7:52 AM  Advanced Directives  Does Patient Have a Medical Advance Directive? No No No No No No Yes  Type of ATransport plannerLiving will  Does patient want to make changes to medical advance directive?       No - Patient declined  Copy of HImpactin Chart?       No - copy requested  Would patient like information on creating a medical advance directive? No - Patient  declined No - Patient declined No - Patient declined No - Patient declined No - Patient declined No - Patient declined No - Patient declined    Current Medications (verified) Outpatient Encounter Medications as of 10/13/2021  Medication Sig   acetaminophen (TYLENOL) 325 MG tablet Take 650 mg by mouth every 6 (six) hours as needed for mild pain or headache.   albuterol (PROVENTIL HFA;VENTOLIN HFA) 108 (90 Base) MCG/ACT inhaler INHALE 2 PUFFS INTO THE LUNGS EVERY 6 (SIX) HOURS AS NEEDED FOR WHEEZING OR SHORTNESS OF BREATH.   aspirin 81 MG EC tablet Take 1 tablet (81 mg total) by mouth daily. Swallow whole.   cholecalciferol (VITAMIN D3) 25 MCG (1000 UNIT) tablet Take 2,000 Units by mouth daily.   dapagliflozin propanediol (FARXIGA) 5 MG TABS tablet Take 1 tablet (5 mg total) by mouth daily before breakfast.   diphenhydrAMINE (BENADRYL) 25 MG tablet Take 50 mg by mouth every 6 (six) hours as needed for allergies.   famotidine (PEPCID) 20 MG tablet Take 20 mg by mouth daily.   fluticasone-salmeterol (ADVAIR) 250-50 MCG/ACT AEPB Inhale 1 puff into the lungs every 12 (twelve) hours.   gabapentin (NEURONTIN) 300 MG capsule TAKE 1 CAPSULE (300 MG TOTAL) BY MOUTH 3 (THREE) TIMES DAILY.   Guaifenesin 1200 MG TB12 Take 1 tablet by mouth 2 (two) times daily as needed (congestion).   pantoprazole (PROTONIX) 40 MG tablet TAKE 1 TABLET ONCE DAILY 30 TO 60 MINUTES BEFORE  FIRST MEAL OF THE DAY   Respiratory Therapy Supplies (FLUTTER) DEVI Use as directed   rosuvastatin (CRESTOR) 20 MG tablet TAKE 1 TABLET EVERY DAY   tamsulosin (FLOMAX) 0.4 MG CAPS capsule Take 0.4 mg by mouth daily.   vitamin B-12 (CYANOCOBALAMIN) 1000 MCG tablet Take 1 tablet (1,000 mcg total) by mouth daily.   No facility-administered encounter medications on file as of 10/13/2021.    Allergies (verified) Patient has no known allergies.   History: Past Medical History:  Diagnosis Date   At risk for sleep apnea    STOP-BANG= 5     SENT TO PCP 06-13-2013   Bladder stone    BPH (benign prostatic hyperplasia)    COPD (chronic obstructive pulmonary disease) (HCC)    Diverticulosis of colon    Dysuria    Glucose intolerance (impaired glucose tolerance)    History of kidney stones    Hyperlipidemia    Hypertension    Ureteral disorder 2018   ureteral repair   Past Surgical History:  Procedure Laterality Date   APPENDECTOMY  1980's   CATARACT EXTRACTION W/ INTRAOCULAR LENS  IMPLANT, BILATERAL     COLONOSCOPY  10-05-2003   tics only    CYSTOSCOPY N/A 06/23/2013   Procedure: CYSTOSCOPY WITH LITHOLAPAXY, BLADDER EXPLORATION, REPAIR BLADDER PERFORATION;  Surgeon: Arvil Persons, MD;  Location: [facility];  Service: Urology;  Laterality: N/A;   CYSTOSCOPY/URETEROSCOPY/HOLMIUM LASER/STENT PLACEMENT Left 04/03/2017   Procedure: CYSTOSCOPY LEFT RETROGRADE/LEFT /URETEROSCOPY/HOLMIUM LASER/BASKET STONE EXTRACTION/STENT PLACEMENT/ URETHRAL BIOPSY;  Surgeon: [name], MD;  Location: [facility];  Service: Urology;  Laterality: Left;   EXTRACORPOREAL SHOCK WAVE LITHOTRIPSY Left 10/23/2016   Procedure: LEFT EXTRACORPOREAL SHOCK WAVE LITHOTRIPSY (ESWL);  Surgeon: Nickie Retort, MD;  Location: [facility];  Service: Urology;  Laterality: Left;   EXTRACORPOREAL SHOCK WAVE LITHOTRIPSY Left 01/08/2017   Procedure: LEFT EXTRACORPOREAL SHOCK WAVE LITHOTRIPSY (ESWL);  Surgeon: [name], MD;  Location: [facility];  Service: Urology;  Laterality: Left;   HOLMIUM LASER APPLICATION N/A 0/16/0109   Procedure: HOLMIUM LASER APPLICATION;  Surgeon: Arvil Persons, MD;  Location: [facility];  Service: Urology;  Laterality: N/A;   LAPAROSCOPIC INGUINAL HERNIA REPAIR Bilateral 10-09-2003   TRANSURETHRAL RESECTION OF PROSTATE N/A 06/23/2013   Procedure: TRANSURETHRAL RESECTION OF THE PROSTATE;  Surgeon: Arvil Persons, MD;  Location: [facility];  Service: Urology;  Laterality: N/A;   Family History  Problem  Relation Age of Onset   COPD Mother    Cancer Father        unknown   Emphysema Brother    Heart failure Brother    Colon cancer Neg Hx    Esophageal cancer Neg Hx    Rectal cancer Neg Hx    Stomach cancer Neg Hx    Social History   Socioeconomic History   Marital status: Widowed    Spouse name: Not on file   Number of children: 3   Years of education: Not on file   Highest education level: GED or equivalent  Occupational History   Occupation: Retired Animal nutritionist Delivery)  Tobacco Use   Smoking status: Former    Packs/day: 3.00    Years: 34.00    Total pack years: 102.00    Types: Cigarettes    Quit date: 01/02/1993    Years since quitting: 28.7   Smokeless tobacco: Never  Vaping Use   Vaping Use: Never used  Substance and Sexual Activity  Alcohol use: No    Alcohol/week: 4.0 standard drinks of alcohol    Types: 4 Standard drinks or equivalent per week   Drug use: No   Sexual activity: Not Currently    Partners: Female  Other Topics Concern   Not on file  Social History Narrative   Widowed; lives in [place] home with daughter and grandaughter.   Feels safe in home.  Wears seatbelt.   No current Advanced Directives.   Independent with ADLs.   No physical activity; does a lot of yard work.   Social Determinants of Health   Financial Resource Strain: Low Risk  (10/13/2021)   Overall Financial Resource Strain (CARDIA)    Difficulty of Paying Living Expenses: Not hard at all  Food Insecurity: No Food Insecurity (10/13/2021)   Hunger Vital Sign    Worried About Running Out of Food in the Last Year: Never true    Ran Out of Food in the Last Year: Never true  Transportation Needs: No Transportation Needs (10/13/2021)   PRAPARE - Hydrologist (Medical): No    Lack of Transportation (Non-Medical): No  Physical Activity: Inactive (10/13/2021)   Exercise Vital Sign    Days of Exercise per Week: 0 days    Minutes of Exercise per  Session: 0 min  Stress: No Stress Concern Present (10/13/2021)   [place]    Feeling of Stress : Not at all  Social Connections: Moderately Integrated (10/13/2021)   Social Connection and Isolation Panel [NHANES]    Frequency of Communication with Friends and Family: More than three times a week    Frequency of Social Gatherings with Friends and Family: More than three times a week    Attends Religious Services: More than 4 times per year    Active Member of [employer] or Organizations: Yes    Attends Archivist Meetings: More than 4 times per year    Marital Status: Widowed    Tobacco Counseling Counseling given: Not Answered   Clinical Intake:  Pre-visit preparation completed: Yes  Pain : No/denies pain Pain Score: 0-No pain     BMI - recorded: 25.34 (08/09/2021) Nutritional Status: BMI 25 -29 Overweight Nutritional Risks: None Diabetes: No  How often do you need to have someone help you when you read instructions, pamphlets, or other written materials from your doctor or pharmacy?: 1 - Never What is the last grade level you completed in school?: HSG  Diabetic? No  Interpreter Needed?: No  Information entered by :: Lisette Abu, LPN.   Activities of Daily Living    10/13/2021    4:32 PM  In your present state of health, do you have any difficulty performing the following activities:  Hearing? 1  Vision? 0  Difficulty concentrating or making decisions? 0  Walking or climbing stairs? 0  Dressing or bathing? 0  Doing errands, shopping? 0  Preparing Food and eating ? N  Using the Toilet? N  In the past six months, have you accidently leaked urine? N  Do you have problems with loss of bowel control? N  Managing your Medications? N  Managing your Finances? N  Housekeeping or managing your Housekeeping? N    Patient Care Team: Biagio Borg, MD as PCP - General [name], MD  as Consulting Physician (Urology) Odis Luster Huey Bienenstock, MD as Referring Physician (Urology)  Indicate any recent Medical Services you may have received from other than  [facility] providers in the past year (date may be approximate).     Assessment:   This is a routine wellness examination for Justin Walters.  Hearing/Vision screen Hearing Screening - Comments:: Patient has bilateral decreased hearing; no hearing aids. Vision Screening - Comments:: Patient wears readers for fine print.  Eye exam completed by [facility]  Dietary issues and exercise activities discussed: Current Exercise Habits: The patient does not participate in regular exercise at present, Exercise limited by: respiratory conditions(s)   Goals Addressed               This Visit's Progress     Client understands the importance of follow-up with providers by attending scheduled visits (pt-stated)        Continue to stay independent as much as possible.      Depression Screen    10/13/2021    4:16 PM 07/07/2021    8:56 AM 10/08/2020    4:03 PM 07/06/2020    8:45 AM 01/06/2020    9:25 AM 07/03/2019    9:29 AM 07/03/2019    8:24 AM  PHQ 2/9 Scores  PHQ - 2 Score 0 0 0 0 0 0 0  PHQ- 9 Score  1         Fall Risk    10/13/2021    4:06 PM 07/07/2021    8:56 AM 10/08/2020    4:05 PM 07/06/2020    8:45 AM 01/06/2020    9:25 AM  [place] in the past year? 1 0 0 0 0  Number falls in past yr: 0 0 0 0   Injury with Fall? 0 0 0 0   Risk for fall due to : No Fall Risks  No Fall Risks    Follow up Falls prevention discussed  Falls evaluation completed      FALL RISK PREVENTION PERTAINING TO THE HOME:  Any stairs in or around the home? Yes  If so, are there any without handrails? No  Home free of loose throw rugs in walkways, pet beds, electrical cords, etc? Yes  Adequate lighting in your home to reduce risk of falls? Yes   ASSISTIVE DEVICES UTILIZED TO PREVENT FALLS:  Life alert? No  Use of a cane, walker or w/c? No   Grab bars in the bathroom? Yes  Shower chair or bench in shower? Yes  Elevated toilet seat or a handicapped toilet? No   TIMED UP AND GO:  Was the test performed? No . Phone Visit   Cognitive Function:    01/04/2017    9:23 AM  MMSE - Mini Mental State Exam  Orientation to time 5  Orientation to Place 5  Registration 3  Attention/ Calculation 5  Recall 1  Language- name 2 objects 2  Language- repeat 1  Language- follow 3 step command 3  Language- read & follow direction 1  Write a sentence 1  Copy design 1  Total score 28        10/13/2021    4:32 PM 07/03/2019    8:33 AM  6CIT Screen  What Year? 0 points 0 points  What month? 0 points 0 points  What time? 0 points 0 points  Count back from 20 0 points 0 points  Months in reverse 0 points 0 points  Repeat phrase 0 points 0 points  Total Score 0 points 0 points    Immunizations Immunization History  Administered Date(s) Administered   Fluad Quad(high Dose 65+) 10/08/2020  Influenza Whole 11/30/2006, 10/03/2007, 12/11/2008, 10/13/2009   Influenza, High Dose Seasonal PF 12/30/2015, 11/15/2016, 10/01/2017   Influenza,inj,Quad PF,6+ Mos 11/17/2014   Influenza-Unspecified 10/24/2019   PFIZER(Purple Top)SARS-COV-2 Vaccination 03/09/2019, 04/08/2019, 10/24/2019   Pneumococcal Conjugate-13 05/01/2013   Pneumococcal Polysaccharide-23 10/03/2007   Td 10/03/2007   Tdap 07/30/2018   Zoster Recombinat (Shingrix) 07/25/2017, 12/03/2017   Zoster, Live 10/03/2007    TDAP status: Up to date  Flu Vaccine status: Due, Education has been provided regarding the importance of this vaccine. Advised may receive this vaccine at local pharmacy or Health Dept. Aware to provide a copy of the vaccination record if obtained from local pharmacy or Health Dept. Verbalized acceptance and understanding.  Pneumococcal vaccine status: Up to date  Covid-19 vaccine status: Completed vaccines  Qualifies for Shingles Vaccine? Yes   Zostavax  completed Yes   Shingrix Completed?: Yes  Screening Tests Health Maintenance  Topic Date Due   INFLUENZA VACCINE  08/02/2021   TETANUS/TDAP  07/29/2028   Pneumonia Vaccine 5+ Years old  Completed   Zoster Vaccines- Shingrix  Completed   HPV VACCINES  Aged Out   COLONOSCOPY (Pts 45-56yr Insurance coverage will need to be confirmed)  Discontinued   COVID-19 Vaccine  Discontinued    Health Maintenance  Health Maintenance Due  Topic Date Due   INFLUENZA VACCINE  08/02/2021    Colorectal cancer screening: No longer required.   Lung Cancer Screening: (Low Dose CT Chest recommended if Age 80-80years, 30 pack-year currently smoking OR have quit w/in 15years.) does not qualify.   Lung Cancer Screening Referral: no  Additional Screening:  Hepatitis C Screening: does not qualify; Completed no  Vision Screening: Recommended annual ophthalmology exams for early detection of glaucoma and other disorders of the eye. Is the patient up to date with their annual eye exam?  Yes  Who is the provider or what is the name of the office in which the patient attends annual eye exams? [facility]/Dr. WInda CokeIf pt is not established with a provider, would they like to be referred to a provider to establish care? No .   Dental Screening: Recommended annual dental exams for proper oral hygiene  Community Resource Referral / Chronic Care Management: CRR required this visit?  No   CCM required this visit?  No      Plan:     I have personally reviewed and noted the following in the patient's chart:   Medical and social history Use of alcohol, tobacco or illicit drugs  Current medications and supplements including opioid prescriptions. Patient is not currently taking opioid prescriptions. Functional ability and status Nutritional status Physical activity Advanced directives List of other physicians Hospitalizations, surgeries, and ER visits in previous 12  months Vitals Screenings to include cognitive, depression, and falls Referrals and appointments  In addition, I have reviewed and discussed with patient certain preventive protocols, quality metrics, and best practice recommendations. A written personalized care plan for preventive services as well as general preventive health recommendations were provided to patient.     SSheral Flow LPN   [id number]  Nurse Notes: N/A

## 2021-10-13 NOTE — Patient Instructions (Signed)
Justin Walters , Thank you for taking time to come for your Medicare Wellness Visit. I appreciate your ongoing commitment to your health goals. Please review the following plan we discussed and let me know if I can assist you in the future.   These are the goals we discussed:  Goals       Client understands the importance of follow-up with providers by attending scheduled visits (pt-stated)      Continue to stay independent as much as possible.        This is a list of the screening recommended for you and due dates:  Health Maintenance  Topic Date Due   Flu Shot  08/02/2021   Tetanus Vaccine  07/29/2028   Pneumonia Vaccine  Completed   Zoster (Shingles) Vaccine  Completed   HPV Vaccine  Aged Out   Colon Cancer Screening  Discontinued   COVID-19 Vaccine  Discontinued    Advanced directives: No  Conditions/risks identified: Yes  Next appointment: Follow up in one year for your annual wellness visit.   Preventive Care 80 Years and Older, Male  Preventive care refers to lifestyle choices and visits with your health care provider that can promote health and wellness. What does preventive care include? A yearly physical exam. This is also called an annual well check. Dental exams once or twice a year. Routine eye exams. Ask your health care provider how often you should have your eyes checked. Personal lifestyle choices, including: Daily care of your teeth and gums. Regular physical activity. Eating a healthy diet. Avoiding tobacco and drug use. Limiting alcohol use. Practicing safe sex. Taking low doses of aspirin every day. Taking vitamin and mineral supplements as recommended by your health care provider. What happens during an annual well check? The services and screenings done by your health care provider during your annual well check will depend on your age, overall health, lifestyle risk factors, and family history of disease. Counseling  Your health care provider may  ask you questions about your: Alcohol use. Tobacco use. Drug use. Emotional well-being. Home and relationship well-being. Sexual activity. Eating habits. History of falls. Memory and ability to understand (cognition). Work and work Statistician. Screening  You may have the following tests or measurements: Height, weight, and BMI. Blood pressure. Lipid and cholesterol levels. These may be checked every 5 years, or more frequently if you are over 4 years old. Skin check. Lung cancer screening. You may have this screening every year starting at age 74 if you have a 30-pack-year history of smoking and currently smoke or have quit within the past 15 years. Fecal occult blood test (FOBT) of the stool. You may have this test every year starting at age 80. Flexible sigmoidoscopy or colonoscopy. You may have a sigmoidoscopy every 5 years or a colonoscopy every 10 years starting at age 66. Prostate cancer screening. Recommendations will vary depending on your family history and other risks. Hepatitis C blood test. Hepatitis B blood test. Sexually transmitted disease (STD) testing. Diabetes screening. This is done by checking your blood sugar (glucose) after you have not eaten for a while (fasting). You may have this done every 1-3 years. Abdominal aortic aneurysm (AAA) screening. You may need this if you are a current or former smoker. Osteoporosis. You may be screened starting at age 66 if you are at high risk. Talk with your health care provider about your test results, treatment options, and if necessary, the need for more tests. Vaccines  Your health  care provider may recommend certain vaccines, such as: Influenza vaccine. This is recommended every year. Tetanus, diphtheria, and acellular pertussis (Tdap, Td) vaccine. You may need a Td booster every 10 years. Zoster vaccine. You may need this after age 42. Pneumococcal 13-valent conjugate (PCV13) vaccine. One dose is recommended after age  40. Pneumococcal polysaccharide (PPSV23) vaccine. One dose is recommended after age 71. Talk to your health care provider about which screenings and vaccines you need and how often you need them. This information is not intended to replace advice given to you by your health care provider. Make sure you discuss any questions you have with your health care provider. Document Released: 01/15/2015 Document Revised: 09/08/2015 Document Reviewed: 10/20/2014 Elsevier Interactive Patient Education  2017 [employer] Prevention in the Home Falls can cause injuries. They can happen to people of all ages. There are many things you can do to make your home safe and to help prevent falls. What can I do on the outside of my home? Regularly fix the edges of walkways and driveways and fix any cracks. Remove anything that might make you trip as you walk through a door, such as a raised step or threshold. Trim any bushes or trees on the path to your home. Use bright outdoor lighting. Clear any walking paths of anything that might make someone trip, such as rocks or tools. Regularly check to see if handrails are loose or broken. Make sure that both sides of any steps have handrails. Any raised decks and porches should have guardrails on the edges. Have any leaves, snow, or ice cleared regularly. Use sand or salt on walking paths during winter. Clean up any spills in your garage right away. This includes oil or grease spills. What can I do in the bathroom? Use night lights. Install grab bars by the toilet and in the tub and shower. Do not use towel bars as grab bars. Use non-skid mats or decals in the tub or shower. If you need to sit down in the shower, use a plastic, non-slip stool. Keep the floor dry. Clean up any water that spills on the floor as soon as it happens. Remove soap buildup in the tub or shower regularly. Attach bath mats securely with double-sided non-slip rug tape. Do not have throw  rugs and other things on the floor that can make you trip. What can I do in the bedroom? Use night lights. Make sure that you have a light by your bed that is easy to reach. Do not use any sheets or blankets that are too big for your bed. They should not hang down onto the floor. Have a firm chair that has side arms. You can use this for support while you get dressed. Do not have throw rugs and other things on the floor that can make you trip. What can I do in the kitchen? Clean up any spills right away. Avoid walking on wet floors. Keep items that you use a lot in easy-to-reach places. If you need to reach something above you, use a strong step stool that has a grab bar. Keep electrical cords out of the way. Do not use floor polish or wax that makes floors slippery. If you must use wax, use non-skid floor wax. Do not have throw rugs and other things on the floor that can make you trip. What can I do with my stairs? Do not leave any items on the stairs. Make sure that there are handrails on  both sides of the stairs and use them. Fix handrails that are broken or loose. Make sure that handrails are as long as the stairways. Check any carpeting to make sure that it is firmly attached to the stairs. Fix any carpet that is loose or worn. Avoid having throw rugs at the top or bottom of the stairs. If you do have throw rugs, attach them to the floor with carpet tape. Make sure that you have a light switch at the top of the stairs and the bottom of the stairs. If you do not have them, ask someone to add them for you. What else can I do to help prevent falls? Wear shoes that: Do not have high heels. Have rubber bottoms. Are comfortable and fit you well. Are closed at the toe. Do not wear sandals. If you use a stepladder: Make sure that it is fully opened. Do not climb a closed stepladder. Make sure that both sides of the stepladder are locked into place. Ask someone to hold it for you, if  possible. Clearly mark and make sure that you can see: Any grab bars or handrails. First and last steps. Where the edge of each step is. Use tools that help you move around (mobility aids) if they are needed. These include: Canes. Walkers. Scooters. Crutches. Turn on the lights when you go into a dark area. Replace any light bulbs as soon as they burn out. Set up your furniture so you have a clear path. Avoid moving your furniture around. If any of your floors are uneven, fix them. If there are any pets around you, be aware of where they are. Review your medicines with your doctor. Some medicines can make you feel dizzy. This can increase your chance of falling. Ask your doctor what other things that you can do to help prevent falls. This information is not intended to replace advice given to you by your health care provider. Make sure you discuss any questions you have with your health care provider. Document Released: 10/15/2008 Document Revised: 05/27/2015 Document Reviewed: 01/23/2014 Elsevier Interactive Patient Education  2017 [employer].

## 2021-10-14 DIAGNOSIS — R3912 Poor urinary stream: Secondary | ICD-10-CM | POA: Diagnosis not present

## 2021-10-14 DIAGNOSIS — N1831 Chronic kidney disease, stage 3a: Secondary | ICD-10-CM | POA: Diagnosis not present

## 2021-10-14 DIAGNOSIS — N401 Enlarged prostate with lower urinary tract symptoms: Secondary | ICD-10-CM | POA: Diagnosis not present

## 2021-11-17 DIAGNOSIS — E119 Type 2 diabetes mellitus without complications: Secondary | ICD-10-CM | POA: Diagnosis not present

## 2021-11-23 ENCOUNTER — Other Ambulatory Visit: Payer: Self-pay | Admitting: Internal Medicine

## 2021-11-23 NOTE — Telephone Encounter (Signed)
Please refill as per office routine med refill policy (all routine meds to be refilled for 3 mo or monthly (per pt preference) up to one year from last visit, then month to month grace period for 3 mo, then further med refills will have to be denied) ? ?

## 2021-11-28 ENCOUNTER — Other Ambulatory Visit: Payer: Self-pay | Admitting: Internal Medicine

## 2021-11-29 DIAGNOSIS — M2042 Other hammer toe(s) (acquired), left foot: Secondary | ICD-10-CM | POA: Diagnosis not present

## 2021-11-29 DIAGNOSIS — M2011 Hallux valgus (acquired), right foot: Secondary | ICD-10-CM | POA: Diagnosis not present

## 2021-11-29 DIAGNOSIS — M2041 Other hammer toe(s) (acquired), right foot: Secondary | ICD-10-CM | POA: Diagnosis not present

## 2021-11-29 DIAGNOSIS — H43823 Vitreomacular adhesion, bilateral: Secondary | ICD-10-CM | POA: Diagnosis not present

## 2021-11-29 DIAGNOSIS — H35033 Hypertensive retinopathy, bilateral: Secondary | ICD-10-CM | POA: Diagnosis not present

## 2021-11-29 DIAGNOSIS — E1151 Type 2 diabetes mellitus with diabetic peripheral angiopathy without gangrene: Secondary | ICD-10-CM | POA: Diagnosis not present

## 2021-11-29 DIAGNOSIS — H3411 Central retinal artery occlusion, right eye: Secondary | ICD-10-CM | POA: Diagnosis not present

## 2021-11-29 DIAGNOSIS — M792 Neuralgia and neuritis, unspecified: Secondary | ICD-10-CM | POA: Diagnosis not present

## 2021-11-29 DIAGNOSIS — I739 Peripheral vascular disease, unspecified: Secondary | ICD-10-CM | POA: Diagnosis not present

## 2021-11-29 DIAGNOSIS — M2012 Hallux valgus (acquired), left foot: Secondary | ICD-10-CM | POA: Diagnosis not present

## 2021-11-29 DIAGNOSIS — B351 Tinea unguium: Secondary | ICD-10-CM | POA: Diagnosis not present

## 2021-11-29 DIAGNOSIS — B353 Tinea pedis: Secondary | ICD-10-CM | POA: Diagnosis not present

## 2021-12-05 ENCOUNTER — Observation Stay (HOSPITAL_COMMUNITY): Payer: Medicare HMO

## 2021-12-05 ENCOUNTER — Other Ambulatory Visit: Payer: Self-pay

## 2021-12-05 ENCOUNTER — Observation Stay (HOSPITAL_COMMUNITY)
Admission: EM | Admit: 2021-12-05 | Discharge: 2021-12-06 | Disposition: A | Discharge status: Home / Self Care | Payer: Medicare HMO | Attending: Internal Medicine | Admitting: Internal Medicine

## 2021-12-05 ENCOUNTER — Encounter (HOSPITAL_COMMUNITY): Payer: Self-pay | Admitting: Emergency Medicine

## 2021-12-05 ENCOUNTER — Emergency Department (HOSPITAL_COMMUNITY): Payer: Medicare HMO

## 2021-12-05 DIAGNOSIS — R29818 Other symptoms and signs involving the nervous system: Secondary | ICD-10-CM | POA: Diagnosis not present

## 2021-12-05 DIAGNOSIS — H349 Unspecified retinal vascular occlusion: Secondary | ICD-10-CM | POA: Diagnosis not present

## 2021-12-05 DIAGNOSIS — N1832 Chronic kidney disease, stage 3b: Secondary | ICD-10-CM | POA: Insufficient documentation

## 2021-12-05 DIAGNOSIS — H538 Other visual disturbances: Secondary | ICD-10-CM | POA: Diagnosis not present

## 2021-12-05 DIAGNOSIS — E119 Type 2 diabetes mellitus without complications: Secondary | ICD-10-CM | POA: Diagnosis not present

## 2021-12-05 DIAGNOSIS — N401 Enlarged prostate with lower urinary tract symptoms: Secondary | ICD-10-CM | POA: Diagnosis not present

## 2021-12-05 DIAGNOSIS — N183 Chronic kidney disease, stage 3 unspecified: Secondary | ICD-10-CM | POA: Diagnosis present

## 2021-12-05 DIAGNOSIS — J449 Chronic obstructive pulmonary disease, unspecified: Secondary | ICD-10-CM | POA: Insufficient documentation

## 2021-12-05 DIAGNOSIS — Z7982 Long term (current) use of aspirin: Secondary | ICD-10-CM | POA: Insufficient documentation

## 2021-12-05 DIAGNOSIS — Z7984 Long term (current) use of oral hypoglycemic drugs: Secondary | ICD-10-CM | POA: Diagnosis not present

## 2021-12-05 DIAGNOSIS — I129 Hypertensive chronic kidney disease with stage 1 through stage 4 chronic kidney disease, or unspecified chronic kidney disease: Secondary | ICD-10-CM | POA: Diagnosis not present

## 2021-12-05 DIAGNOSIS — Z87891 Personal history of nicotine dependence: Secondary | ICD-10-CM | POA: Insufficient documentation

## 2021-12-05 DIAGNOSIS — H3411 Central retinal artery occlusion, right eye: Principal | ICD-10-CM | POA: Insufficient documentation

## 2021-12-05 DIAGNOSIS — E1122 Type 2 diabetes mellitus with diabetic chronic kidney disease: Secondary | ICD-10-CM | POA: Insufficient documentation

## 2021-12-05 DIAGNOSIS — Z79899 Other long term (current) drug therapy: Secondary | ICD-10-CM | POA: Insufficient documentation

## 2021-12-05 DIAGNOSIS — I10 Essential (primary) hypertension: Secondary | ICD-10-CM | POA: Diagnosis not present

## 2021-12-05 DIAGNOSIS — I672 Cerebral atherosclerosis: Secondary | ICD-10-CM | POA: Diagnosis not present

## 2021-12-05 DIAGNOSIS — N400 Enlarged prostate without lower urinary tract symptoms: Secondary | ICD-10-CM | POA: Diagnosis present

## 2021-12-05 LAB — CBC
HCT: 42.4 % (ref 39.0–52.0)
Hemoglobin: 13.5 g/dL (ref 13.0–17.0)
MCH: 26.7 pg (ref 26.0–34.0)
MCHC: 31.8 g/dL (ref 30.0–36.0)
MCV: 84 fL (ref 80.0–100.0)
Platelets: 187 10*3/uL (ref 150–400)
RBC: 5.05 MIL/uL (ref 4.22–5.81)
RDW: 15.2 % (ref 11.5–15.5)
WBC: 5.8 10*3/uL (ref 4.0–10.5)
nRBC: 0 % (ref 0.0–0.2)

## 2021-12-05 LAB — I-STAT CHEM 8, ED
BUN: 28 mg/dL — ABNORMAL HIGH (ref 8–23)
Calcium, Ion: 1.17 mmol/L (ref 1.15–1.40)
Chloride: 104 mmol/L (ref 98–111)
Creatinine, Ser: 1.9 mg/dL — ABNORMAL HIGH (ref 0.61–1.24)
Glucose, Bld: 88 mg/dL (ref 70–99)
HCT: 44 % (ref 39.0–52.0)
Hemoglobin: 15 g/dL (ref 13.0–17.0)
Potassium: 3.9 mmol/L (ref 3.5–5.1)
Sodium: 140 mmol/L (ref 135–145)
TCO2: 27 mmol/L (ref 22–32)

## 2021-12-05 LAB — COMPREHENSIVE METABOLIC PANEL
ALT: 13 U/L (ref 0–44)
AST: 15 U/L (ref 15–41)
Albumin: 3.8 g/dL (ref 3.5–5.0)
Alkaline Phosphatase: 47 U/L (ref 38–126)
Anion gap: 6 (ref 5–15)
BUN: 26 mg/dL — ABNORMAL HIGH (ref 8–23)
CO2: 25 mmol/L (ref 22–32)
Calcium: 8.7 mg/dL — ABNORMAL LOW (ref 8.9–10.3)
Chloride: 107 mmol/L (ref 98–111)
Creatinine, Ser: 1.77 mg/dL — ABNORMAL HIGH (ref 0.61–1.24)
GFR, Estimated: 38 mL/min — ABNORMAL LOW (ref >60)
Glucose, Bld: 96 mg/dL (ref 70–99)
Potassium: 4.1 mmol/L (ref 3.5–5.1)
Sodium: 138 mmol/L (ref 135–145)
Total Bilirubin: 0.6 mg/dL (ref 0.3–1.2)
Total Protein: 6.5 g/dL (ref 6.5–8.1)

## 2021-12-05 LAB — DIFFERENTIAL
Abs Immature Granulocytes: 0.02 10*3/uL (ref 0.00–0.07)
Basophils Absolute: 0.1 10*3/uL (ref 0.0–0.1)
Basophils Relative: 1 %
Eosinophils Absolute: 0.2 10*3/uL (ref 0.0–0.5)
Eosinophils Relative: 3 %
Immature Granulocytes: 0 %
Lymphocytes Relative: 21 %
Lymphs Abs: 1.3 10*3/uL (ref 0.7–4.0)
Monocytes Absolute: 0.6 10*3/uL (ref 0.1–1.0)
Monocytes Relative: 10 %
Neutro Abs: 3.8 10*3/uL (ref 1.7–7.7)
Neutrophils Relative %: 65 %

## 2021-12-05 LAB — PROTIME-INR
INR: 1.1 (ref 0.8–1.2)
Prothrombin Time: 14 seconds (ref 11.4–15.2)

## 2021-12-05 LAB — APTT: aPTT: 30 seconds (ref 24–36)

## 2021-12-05 LAB — ETHANOL: Alcohol, Ethyl (B): <10 mg/dL (ref <10)

## 2021-12-05 MED ORDER — SODIUM CHLORIDE 0.9 % IV SOLN: INTRAVENOUS | Status: DC

## 2021-12-05 MED ORDER — GUAIFENESIN ER 600 MG PO TB12: 600.0000 mg | ORAL_TABLET | Freq: Two times a day (BID) | ORAL | Status: DC | PRN

## 2021-12-05 MED ORDER — MELATONIN 3 MG PO TABS
3.0000 mg | ORAL_TABLET | Freq: Every day | ORAL | Status: DC
  Administered 2021-12-05: 3 mg via ORAL
  Filled 2021-12-05: qty 1

## 2021-12-05 MED ORDER — ACETAMINOPHEN 325 MG PO TABS: 650.0000 mg | ORAL_TABLET | ORAL | Status: DC | PRN

## 2021-12-05 MED ORDER — GABAPENTIN 300 MG PO CAPS
600.0000 mg | ORAL_CAPSULE | Freq: Every day | ORAL | Status: DC
  Administered 2021-12-05: 600 mg via ORAL
  Filled 2021-12-05: qty 2

## 2021-12-05 MED ORDER — GABAPENTIN 300 MG PO CAPS
300.0000 mg | ORAL_CAPSULE | Freq: Every day | ORAL | Status: DC
  Administered 2021-12-06: 300 mg via ORAL
  Filled 2021-12-05: qty 1

## 2021-12-05 MED ORDER — CALCIUM GLUCONATE-NACL 1-0.675 GM/50ML-% IV SOLN
1.0000 g | Freq: Once | INTRAVENOUS | Status: AC
  Administered 2021-12-05: 1000 mg via INTRAVENOUS
  Filled 2021-12-05: qty 50

## 2021-12-05 MED ORDER — ASPIRIN 81 MG PO CHEW
81.0000 mg | CHEWABLE_TABLET | Freq: Every day | ORAL | Status: DC
  Administered 2021-12-05 – 2021-12-06 (×2): 81 mg via ORAL
  Filled 2021-12-05 (×2): qty 1

## 2021-12-05 MED ORDER — STROKE: EARLY STAGES OF RECOVERY BOOK
Freq: Once | Status: AC
  Filled 2021-12-05: qty 1

## 2021-12-05 MED ORDER — PANTOPRAZOLE SODIUM 40 MG PO TBEC
40.0000 mg | DELAYED_RELEASE_TABLET | Freq: Every day | ORAL | Status: DC
  Administered 2021-12-06: 40 mg via ORAL
  Filled 2021-12-05: qty 1

## 2021-12-05 MED ORDER — ACETAMINOPHEN 650 MG RE SUPP: 650.0000 mg | RECTAL | Status: DC | PRN

## 2021-12-05 MED ORDER — ROSUVASTATIN CALCIUM 20 MG PO TABS
20.0000 mg | ORAL_TABLET | Freq: Every evening | ORAL | Status: DC
  Administered 2021-12-05: 20 mg via ORAL
  Filled 2021-12-05: qty 1

## 2021-12-05 MED ORDER — CLOPIDOGREL BISULFATE 75 MG PO TABS
75.0000 mg | ORAL_TABLET | Freq: Every day | ORAL | Status: DC
  Administered 2021-12-05 – 2021-12-06 (×2): 75 mg via ORAL
  Filled 2021-12-05 (×2): qty 1

## 2021-12-05 MED ORDER — ACETAMINOPHEN 160 MG/5ML PO SOLN: 650.0000 mg | ORAL | Status: DC | PRN

## 2021-12-05 MED ORDER — DAPAGLIFLOZIN PROPANEDIOL 5 MG PO TABS
5.0000 mg | ORAL_TABLET | Freq: Every day | ORAL | Status: DC
  Administered 2021-12-06: 5 mg via ORAL
  Filled 2021-12-05 (×2): qty 1

## 2021-12-05 MED ORDER — ENOXAPARIN SODIUM 30 MG/0.3ML IJ SOSY
30.0000 mg | PREFILLED_SYRINGE | Freq: Every day | INTRAMUSCULAR | Status: DC
  Administered 2021-12-05 – 2021-12-06 (×2): 30 mg via SUBCUTANEOUS
  Filled 2021-12-05 (×2): qty 0.3

## 2021-12-05 MED ORDER — TAMSULOSIN HCL 0.4 MG PO CAPS
0.4000 mg | ORAL_CAPSULE | Freq: Every evening | ORAL | Status: DC
  Administered 2021-12-05: 0.4 mg via ORAL
  Filled 2021-12-05: qty 1

## 2021-12-05 NOTE — ED Provider Notes (Signed)
[facility] EMERGENCY DEPARTMENT Provider Note   CSN: [id number] Arrival date & time: 12/05/21  1009     History  Chief Complaint  Patient presents with  . Loss of Vision    Justin Walters is a 81 y.o. male.  HPI 81 year old male with history of diabetes, COPD, hypertension (not on medications), HLD, CKD III, BPH presenting for sudden onset blurring of vision in his right eye.  This occurred about 2.5 weeks ago on 11/16.  He was seen by his retina specialist on 11/28 and was diagnosed with CRAO and advised to come to the ED for CVA workup.  Due to hospital wait times, he did not go to the ED and waited until today to come to the ED.  Since onset, he believes his vision has not changed.  Denies any weakness, slurred speech.    Home Medications Prior to Admission medications   Medication Sig Start Date End Date Taking? Authorizing Provider  acetaminophen (TYLENOL) 325 MG tablet Take 650 mg by mouth every 6 (six) hours as needed for mild pain or headache.    [provider]  albuterol (PROVENTIL HFA;VENTOLIN HFA) 108 (90 Base) MCG/ACT inhaler INHALE 2 PUFFS INTO THE LUNGS EVERY 6 (SIX) HOURS AS NEEDED FOR WHEEZING OR SHORTNESS OF BREATH. 05/15/17   Biagio Borg, MD  aspirin 81 MG EC tablet Take 1 tablet (81 mg total) by mouth daily. Swallow whole. 01/09/17   [name], MD  cholecalciferol (VITAMIN D3) 25 MCG (1000 UNIT) tablet Take 2,000 Units by mouth daily.    [provider]  dapagliflozin propanediol (FARXIGA) 5 MG TABS tablet Take 1 tablet (5 mg total) by mouth daily before breakfast. 01/06/21   Biagio Borg, MD  diphenhydrAMINE (BENADRYL) 25 MG tablet Take 50 mg by mouth every 6 (six) hours as needed for allergies.    [provider]  famotidine (PEPCID) 20 MG tablet Take 20 mg by mouth daily.    [provider]  fluticasone-salmeterol (ADVAIR) 250-50 MCG/ACT AEPB Inhale 1 puff into the lungs every 12 (twelve) hours. 08/09/21    Tanda Rockers, MD  gabapentin (NEURONTIN) 300 MG capsule TAKE 1 CAPSULE THREE TIMES DAILY 11/28/21   Biagio Borg, MD  Guaifenesin 1200 MG TB12 Take 1 tablet by mouth 2 (two) times daily as needed (congestion).    [provider]  pantoprazole (PROTONIX) 40 MG tablet TAKE 1 TABLET ONCE DAILY 30 TO 60 MINUTES BEFORE  FIRST MEAL OF THE DAY 06/17/21   Tanda Rockers, MD  Respiratory Therapy Supplies (FLUTTER) DEVI Use as directed 12/05/17   Tanda Rockers, MD  rosuvastatin (CRESTOR) 20 MG tablet TAKE 1 TABLET EVERY DAY 11/28/21   Biagio Borg, MD  tamsulosin (FLOMAX) 0.4 MG CAPS capsule Take 0.4 mg by mouth daily. 09/04/18   [provider]  vitamin B-12 (CYANOCOBALAMIN) 1000 MCG tablet Take 1 tablet (1,000 mcg total) by mouth daily. 01/30/19   Biagio Borg, MD      Allergies    Patient has no known allergies.    Review of Systems   Review of Systems  Physical Exam Updated Vital Signs BP 127/87 (BP Location: Right Arm)   Pulse 66   Temp 97.6 F (36.4 C) (Oral)   Resp 18   Ht '5\' 7"'$  (1.702 m)   Wt 73 kg   SpO2 97%   BMI 25.21 kg/m  Physical Exam Constitutional:      General: He is  not in acute distress. HENT:     Head: Normocephalic and atraumatic.     Mouth/Throat:     Mouth: Mucous membranes are moist.     Pharynx: Oropharynx is clear.  Eyes:     Extraocular Movements: Extraocular movements intact.     Pupils: Pupils are equal, round, and reactive to light.  Cardiovascular:     Rate and Rhythm: Normal rate and regular rhythm.  Pulmonary:     Effort: Pulmonary effort is normal. No respiratory distress.     Breath sounds: Normal breath sounds.  Neurological:     Mental Status: He is alert.     Cranial Nerves: No cranial nerve deficit.     Sensory: No sensory deficit.     Motor: No weakness.     Comments: Cranial nerves II through XII intact.  5/5 strength in upper and lower extremities bilaterally.  Sensation is preserved.     ED Results /  Procedures / Treatments   Labs (all labs ordered are listed, but only abnormal results are displayed) Labs Reviewed  COMPREHENSIVE METABOLIC PANEL - Abnormal; Notable for the following components:      Result Value   BUN 26 (*)    Creatinine, Ser 1.77 (*)    Calcium 8.7 (*)    GFR, Estimated 38 (*)    All other components within normal limits  I-STAT CHEM 8, ED - Abnormal; Notable for the following components:   BUN 28 (*)    Creatinine, Ser 1.90 (*)    All other components within normal limits  ETHANOL  PROTIME-INR  APTT  CBC  DIFFERENTIAL  RAPID URINE DRUG SCREEN, HOSP PERFORMED  URINALYSIS, ROUTINE W REFLEX MICROSCOPIC    EKG EKG Interpretation  Date/Time:  Monday December 05 2021 10:42:29 EST Ventricular Rate:  72 PR Interval:  160 QRS Duration: 82 QT Interval:  388 QTC Calculation: 424 R Axis:   59 Text Interpretation: Normal sinus rhythm Normal ECG When compared with ECG of 29-Jan-2020 02:40, PREVIOUS ECG IS PRESENT Confirmed by Lacretia Leigh ([id number]) on 12/05/2021 3:35:45 PM  Radiology CT Head Wo Contrast  Result Date: 12/05/2021 CLINICAL DATA:  Neuro deficit, acute, stroke suspected. Blurred vision right eye. EXAM: CT HEAD WITHOUT CONTRAST TECHNIQUE: Contiguous axial images were obtained from the base of the skull through the vertex without intravenous contrast. RADIATION DOSE REDUCTION: This exam was performed according to the departmental dose-optimization program which includes automated exposure control, adjustment of the mA and/or kV according to patient size and/or use of iterative reconstruction technique. COMPARISON:  MRI 09/14/2014 FINDINGS: Brain: Age related volume loss, possibly frontal predominant. Old small vessel infarction in the left frontoparietal white matter as shown on the prior MRI. No evidence of advanced white matter changes. No sign of cortical or large vessel territory infarction. No mass, hemorrhage, hydrocephalus or extra-axial collection.  Vascular: There is atherosclerotic calcification of the major vessels at the base of the brain. Skull: Negative Sinuses/Orbits: Clear/normal Other: None IMPRESSION: No acute CT finding. Age related volume loss, possibly frontal predominant. Old small vessel infarction in the left frontoparietal white matter. No CT evidence of advanced/widespread small-vessel disease. Electronically Signed   By: Nelson Chimes M.D.   On: 12/05/2021 13:35    Procedures Procedures    Medications Ordered in ED Medications - No data to display  ED Course/ Medical Decision Making/ A&P Clinical Course as of 12/05/21 1613  Mon Dec 05, 2021  1610 81 M with unilateral vision loss dx with CRAO end  of November, Here with same unchanged. Admitting to hospitalist for stroke workup per neurology. [VB]    Clinical Course User Index [VB] Elgie Congo, MD                           Medical Decision Making 81 year old male with history of T2DM, HTN, HLD, CKD III presenting with sudden onset right eye blurring now ongoing for about 2.5 weeks recently diagnosed with CRAO by his ophthalmologist.  No other neurological deficits and CT head does not show any acute CVA.  Labs unremarkable other than elevated serum creatinine which is consistent with his CKD 3.  Spoke with neurology Dr. Su Monks who recommends admission for further CVA workup given that CRAO is a stroke equivalent.  Spoke with hospitalist Dr. Tamala Julian who will admit patient.  Final Clinical Impression(s) / ED Diagnoses Final diagnoses:  None    Rx / DC Orders ED Discharge Orders     None         Zola Button, MD 12/05/21 1613    Lacretia Leigh, MD 12/06/21 1335

## 2021-12-05 NOTE — ED Provider Notes (Signed)
I saw and evaluated the patient, reviewed the resident's note and I agree with the findings and plan.  EKG Interpretation  Date/Time:  Monday December 05 2021 10:42:29 EST Ventricular Rate:  72 PR Interval:  160 QRS Duration: 82 QT Interval:  388 QTC Calculation: 424 R Axis:   59 Text Interpretation: Normal sinus rhythm Normal ECG When compared with ECG of 29-Jan-2020 02:40, PREVIOUS ECG IS PRESENT Confirmed by Lacretia Leigh ([id number]) on 12/05/2021 3:35:45 PM Patient's EKG per interpretation shows normal sinus rhythm.  No signs of A-fib.  Patient presented after having been diagnosed with a CRAO over a week ago.  His original symptoms started about 3 weeks ago.  He notes blurred vision but this is unchanged.  Discussed with neurology recommends inpatient mission.  Will consult hospitalist team   Lacretia Leigh, MD 12/05/21 1553

## 2021-12-05 NOTE — ED Triage Notes (Signed)
Pt saw eye doc a week ago and worried about stroke in right eye. Pt states the hospital was so busy that he did not come. Denies any weakness. Can see some light in right eye, just blurry.

## 2021-12-05 NOTE — Consult Note (Signed)
Neurology Consultation  Reason for Consult: subacute CRAO Referring Physician: Dr. Katrinka Blazing  CC: Sudden onset blurring of vision in right eye  History is obtained from: Patient and chart  HPI: Justin Walters is a 81 y.o. male with history of COPD, diabetes, hypertension, hyperlipidemia, CKD stage III and BPH who presents after sudden onset blurring of vision in his right eye occurred on 11/16.  Patient was then seen by his eye doctor who referred him to a retina specialist whom he saw on 11/28.  He was then diagnosed with CRAO and was advised to come to the hospital for stroke work-up.  However, the hospital was very busy so patient delayed this until today.  Patient denies unilateral weakness, sensory deficit or other symptoms.   LKW: 11/16 TNK given?: no, outside of window IR Thrombectomy? No, outside of window Modified Rankin Scale: 2-Slight disability-UNABLE to perform all activities but does not need assistance  ROS: A complete ROS was performed and is negative except as noted in the HPI.   Past Medical History:  Diagnosis Date   At risk for sleep apnea    STOP-BANG= 5    SENT TO PCP 06-13-2013   Bladder stone    BPH (benign prostatic hyperplasia)    COPD (chronic obstructive pulmonary disease) (HCC)    Diverticulosis of colon    Dysuria    Glucose intolerance (impaired glucose tolerance)    History of kidney stones    Hyperlipidemia    Hypertension    Ureteral disorder 2018   ureteral repair     Family History  Problem Relation Age of Onset   COPD Mother    Cancer Father        unknown   Emphysema Brother    Heart failure Brother    Colon cancer Neg Hx    Esophageal cancer Neg Hx    Rectal cancer Neg Hx    Stomach cancer Neg Hx      Social History:   reports that he quit smoking about 28 years ago. His smoking use included cigarettes. He has a 102.00 pack-year smoking history. He has never used smokeless tobacco. He reports that he does not drink alcohol and does  not use drugs.  Medications  Current Facility-Administered Medications:    aspirin chewable tablet 81 mg, 81 mg, Oral, Daily, de Saintclair Halsted, Cortney E, NP   clopidogrel (PLAVIX) tablet 75 mg, 75 mg, Oral, Daily, de Saintclair Halsted, Cortney E, NP  Current Outpatient Medications:    acetaminophen (TYLENOL) 325 MG tablet, Take 650 mg by mouth every 6 (six) hours as needed for mild pain or headache., Disp: , Rfl:    albuterol (PROVENTIL HFA;VENTOLIN HFA) 108 (90 Base) MCG/ACT inhaler, INHALE 2 PUFFS INTO THE LUNGS EVERY 6 (SIX) HOURS AS NEEDED FOR WHEEZING OR SHORTNESS OF BREATH., Disp: 54 g, Rfl: 3   aspirin 81 MG EC tablet, Take 1 tablet (81 mg total) by mouth daily. Swallow whole., Disp: 30 tablet, Rfl: 12   cholecalciferol (VITAMIN D3) 25 MCG (1000 UNIT) tablet, Take 2,000 Units by mouth daily., Disp: , Rfl:    dapagliflozin propanediol (FARXIGA) 5 MG TABS tablet, Take 1 tablet (5 mg total) by mouth daily before breakfast., Disp: 90 tablet, Rfl: 3   diphenhydrAMINE (BENADRYL) 25 MG tablet, Take 50 mg by mouth every 6 (six) hours as needed for allergies., Disp: , Rfl:    famotidine (PEPCID) 20 MG tablet, Take 20 mg by mouth daily., Disp: , Rfl:  fluticasone-salmeterol (ADVAIR) 250-50 MCG/ACT AEPB, Inhale 1 puff into the lungs every 12 (twelve) hours., Disp: , Rfl:    gabapentin (NEURONTIN) 300 MG capsule, TAKE 1 CAPSULE THREE TIMES DAILY, Disp: 270 capsule, Rfl: 1   Guaifenesin 1200 MG TB12, Take 1 tablet by mouth 2 (two) times daily as needed (congestion)., Disp: , Rfl:    pantoprazole (PROTONIX) 40 MG tablet, TAKE 1 TABLET ONCE DAILY 30 TO 60 MINUTES BEFORE  FIRST MEAL OF THE DAY, Disp: 90 tablet, Rfl: 1   Respiratory Therapy Supplies (FLUTTER) DEVI, Use as directed, Disp: 1 each, Rfl: 0   rosuvastatin (CRESTOR) 20 MG tablet, TAKE 1 TABLET EVERY DAY, Disp: 90 tablet, Rfl: 2   tamsulosin (FLOMAX) 0.4 MG CAPS capsule, Take 0.4 mg by mouth daily., Disp: , Rfl:    vitamin B-12 (CYANOCOBALAMIN) 1000 MCG  tablet, Take 1 tablet (1,000 mcg total) by mouth daily., Disp: 90 tablet, Rfl: 1   Exam: Current vital signs: BP 132/77 (BP Location: Right Arm)   Pulse 68   Temp 97.8 F (36.6 C) (Oral)   Resp 18   Ht 5\' 7"  (1.702 m)   Wt 73 kg   SpO2 100%   BMI 25.21 kg/m  Vital signs in last 24 hours: Temp:  [97.6 F (36.4 C)-97.8 F (36.6 C)] 97.8 F (36.6 C) (12/04 1558) Pulse Rate:  [66-79] 68 (12/04 1558) Resp:  [16-18] 18 (12/04 1558) BP: (116-132)/(77-87) 132/77 (12/04 1558) SpO2:  [97 %-100 %] 100 % (12/04 1558) Weight:  [73 kg] 73 kg (12/04 1047)  GENERAL: Awake, alert, in no acute distress Psych: Affect appropriate for situation, patient is calm and cooperative with examination Head: Normocephalic and atraumatic, without obvious abnormality EENT: Normal conjunctivae, dry mucous membranes, no OP obstruction LUNGS: Normal respiratory effort. Non-labored breathing on room air Extremities: warm, well perfused, without obvious deformity  NEURO:  Mental Status: Awake, alert, and oriented to person, place, time, and situation. He/ is able to provide a clear and coherent history of present illness. Speech/Language: speech is clear and fluent. No neglect is noted Cranial Nerves:  II: PERRL. visual fields full with blurred vision on the right III, IV, VI: EOMI. Lid elevation symmetric and full.  V: Sensation is intact to light touch and symmetrical to face.  VII: Face is symmetric resting and smiling.  VIII: Hearing intact to voice IX, X: Phonation normal.  XI: Normal sternocleidomastoid and trapezius muscle strength XII: Tongue protrudes midline without fasciculations.   Motor: 5/5 strength is all muscle groups.  Tone is normal. Bulk is normal.  Sensation: Intact to light touch bilaterally in all four extremities. No extinction to DSS present.  Coordination: FTN intact bilaterally. No pronator drift.  Gait: Deferred  NIHSS: 1a Level of Conscious.: 0 1b LOC Questions: 0 1c  LOC Commands: 0 2 Best Gaze: 0 3 Visual: 0 4 Facial Palsy: 0 5a Motor Arm - left: 0 5b Motor Arm - Right: 0 6a Motor Leg - Left: 0 6b Motor Leg - Right: 0 7 Limb Ataxia: 0 8 Sensory: 0 9 Best Language: 0 10 Dysarthria: 0 11 Extinct. and Inatten.: 0 TOTAL: 0   Labs I have reviewed labs in epic and the results pertinent to this consultation are:  CBC    Component Value Date/Time   WBC 5.8 12/05/2021 1053   RBC 5.05 12/05/2021 1053   HGB 15.0 12/05/2021 1119   HCT 44.0 12/05/2021 1119   PLT 187 12/05/2021 1053   MCV 84.0 12/05/2021 1053  MCH 26.7 12/05/2021 1053   MCHC 31.8 12/05/2021 1053   RDW 15.2 12/05/2021 1053   LYMPHSABS 1.3 12/05/2021 1053   MONOABS 0.6 12/05/2021 1053   EOSABS 0.2 12/05/2021 1053   BASOSABS 0.1 12/05/2021 1053    CMP     Component Value Date/Time   NA 140 12/05/2021 1119   K 3.9 12/05/2021 1119   CL 104 12/05/2021 1119   CO2 25 12/05/2021 1053   GLUCOSE 88 12/05/2021 1119   GLUCOSE 101 (H) 01/09/2006 0841   BUN 28 (H) 12/05/2021 1119   CREATININE 1.90 (H) 12/05/2021 1119   CALCIUM 8.7 (L) 12/05/2021 1053   CALCIUM 9.3 01/09/2011 1553   PROT 6.5 12/05/2021 1053   ALBUMIN 3.8 12/05/2021 1053   AST 15 12/05/2021 1053   ALT 13 12/05/2021 1053   ALKPHOS 47 12/05/2021 1053   BILITOT 0.6 12/05/2021 1053   GFRNONAA 38 (L) 12/05/2021 1053   GFRAA >60 03/27/2018 0308    Lipid Panel     Component Value Date/Time   CHOL 122 06/30/2021 1233   TRIG 134.0 06/30/2021 1233   TRIG 85 01/09/2006 0841   HDL 44.40 06/30/2021 1233   CHOLHDL 3 06/30/2021 1233   VLDL 26.8 06/30/2021 1233   LDLCALC 51 06/30/2021 1233   LDLDIRECT 161.9 03/28/2012 1419     Imaging I have reviewed the images obtained:  CT-scan of the brain: No acute abnormality, atrophy  MRI examination of the brain: pending  Assessment: 81 year old patient with history of hypertension, hyperlipidemia, diabetes, CKD 3 and COPD presents with acute onset blurred vision in the  right eye which occurred on 11/16.  Patient was then seen by his eye doctor and a retina specialist on 11/28 who diagnosed him with a CRAO and directed him to come to the hospital for stroke work-up.  Due to the hospital being busy, patient delayed coming here until today.  CT of the brain reveals no acute abnormality.  Patient reports no other deficits or symptoms, and no other deficits are apparent on exam.  Will admit patient for stroke work-up.  Impression: Subacute right CRAO  Recommendations: Stroke/TIA Workup  - Admit for stroke workup -Outside of permissive hypertension window - MRI brain wo contrast - MRA head and carotid ultrasound - TTE w/ bubble - Check A1c and LDL + add statin per guidelines -81 mg of aspirin daily and 75 mg of Plavix daily for 3 weeks, followed by plavix 75mg  daily alone. - q4 hr neuro checks - STAT head CT for any change in neuro exam - Tele - PT/OT/SLP - Stroke education - Amb referral to neurology upon discharge    Pt seen by NP/Neuro and later by MD. Note/plan to be edited by MD as needed.  Cortney E Ernestina Columbia , MSN, AGACNP-BC [facility] See Amion for schedule and pager information 12/05/2021 4:19 PM    NEUROHOSPITALIST ADDENDUM Performed a face to face diagnostic evaluation.   I have reviewed the contents of history and physical exam as documented by PA/ARNP/Resident and agree with above documentation.  I have discussed and formulated the above plan as documented. Edits to the note have been made as needed.  Erick Blinks, MD [facility] [phone]   If 7pm to 7am, please call on call as listed on AMION.

## 2021-12-05 NOTE — H&P (Signed)
History and Physical    Patient: Justin Walters JJO:[id number] DOB: June 05, 1940 DOA: 12/05/2021 DOS: the patient was seen and examined on 12/05/2021 PCP: Biagio Borg, MD  Patient coming from: Home  Chief Complaint:  Chief Complaint  Patient presents with   Loss of Vision   HPI: Justin Walters is a 81 y.o. male with medical history significant of hypertension, hyperlipidemia, nephrolithiasis, CKD stage IIIb, BPH, sleep apnea who presents with blurry vision in his right eye since 11/16.  Patient had went to see his eye doctor on 11/28 and was diagnosed with central retinal artery occlusion of the right eye.  He was advised to come to the hospital at that time, but states that the wait times were too long.  Denied having any recent fever, sick contacts, headache, focal weakness, palpitations, or chest pain.  He does report having chronic burning with urination that is unchanged.  Upon admission into the emergency department patient was noted to have stable vital signs.  CT scan of the head without contrast noted no acute abnormality.  Patient was not a candidate for thrombolytics as he was outside of the window.  Labs noted BUN 26, creatinine 1.77, and calcium 8.7.  Neurology had recommended admission for completion of stroke workup.  . Review of Systems: As mentioned in the history of present illness. All other systems reviewed and are negative. Past Medical History:  Diagnosis Date   At risk for sleep apnea    STOP-BANG= 5    SENT TO PCP 06-13-2013   Bladder stone    BPH (benign prostatic hyperplasia)    COPD (chronic obstructive pulmonary disease) (HCC)    Diverticulosis of colon    Dysuria    Glucose intolerance (impaired glucose tolerance)    History of kidney stones    Hyperlipidemia    Hypertension    Ureteral disorder 2018   ureteral repair   Past Surgical History:  Procedure Laterality Date   APPENDECTOMY  1980's   CATARACT EXTRACTION W/ INTRAOCULAR LENS  IMPLANT, BILATERAL      COLONOSCOPY  10-05-2003   tics only    CYSTOSCOPY N/A 06/23/2013   Procedure: CYSTOSCOPY WITH LITHOLAPAXY, BLADDER EXPLORATION, REPAIR BLADDER PERFORATION;  Surgeon: Arvil Persons, MD;  Location: [facility];  Service: Urology;  Laterality: N/A;   CYSTOSCOPY/URETEROSCOPY/HOLMIUM LASER/STENT PLACEMENT Left 04/03/2017   Procedure: CYSTOSCOPY LEFT RETROGRADE/LEFT /URETEROSCOPY/HOLMIUM LASER/BASKET STONE EXTRACTION/STENT PLACEMENT/ URETHRAL BIOPSY;  Surgeon: [name], MD;  Location: [facility];  Service: Urology;  Laterality: Left;   EXTRACORPOREAL SHOCK WAVE LITHOTRIPSY Left 10/23/2016   Procedure: LEFT EXTRACORPOREAL SHOCK WAVE LITHOTRIPSY (ESWL);  Surgeon: Nickie Retort, MD;  Location: [facility];  Service: Urology;  Laterality: Left;   EXTRACORPOREAL SHOCK WAVE LITHOTRIPSY Left 01/08/2017   Procedure: LEFT EXTRACORPOREAL SHOCK WAVE LITHOTRIPSY (ESWL);  Surgeon: [name], MD;  Location: [facility];  Service: Urology;  Laterality: Left;   HOLMIUM LASER APPLICATION N/A 1/60/1093   Procedure: HOLMIUM LASER APPLICATION;  Surgeon: Arvil Persons, MD;  Location: [facility];  Service: Urology;  Laterality: N/A;   LAPAROSCOPIC INGUINAL HERNIA REPAIR Bilateral 10-09-2003   TRANSURETHRAL RESECTION OF PROSTATE N/A 06/23/2013   Procedure: TRANSURETHRAL RESECTION OF THE PROSTATE;  Surgeon: Arvil Persons, MD;  Location: [facility];  Service: Urology;  Laterality: N/A;   Social History:  reports that he quit smoking about 28 years ago. His smoking use included cigarettes. He has a 102.00 pack-year smoking history. He has never used  smokeless tobacco. He reports that he does not drink alcohol and does not use drugs.  No Known Allergies  Family History  Problem Relation Age of Onset   COPD Mother    Cancer Father        unknown   Emphysema Brother    Heart failure Brother    Colon cancer Neg Hx    Esophageal cancer Neg Hx    Rectal cancer Neg Hx    Stomach  cancer Neg Hx     Prior to Admission medications   Medication Sig Start Date End Date Taking? Authorizing Provider  acetaminophen (TYLENOL) 325 MG tablet Take 650 mg by mouth every 6 (six) hours as needed for mild pain or headache.    [provider]  albuterol (PROVENTIL HFA;VENTOLIN HFA) 108 (90 Base) MCG/ACT inhaler INHALE 2 PUFFS INTO THE LUNGS EVERY 6 (SIX) HOURS AS NEEDED FOR WHEEZING OR SHORTNESS OF BREATH. 05/15/17   Biagio Borg, MD  aspirin 81 MG EC tablet Take 1 tablet (81 mg total) by mouth daily. Swallow whole. 01/09/17   [name], MD  cholecalciferol (VITAMIN D3) 25 MCG (1000 UNIT) tablet Take 2,000 Units by mouth daily.    [provider]  dapagliflozin propanediol (FARXIGA) 5 MG TABS tablet Take 1 tablet (5 mg total) by mouth daily before breakfast. 01/06/21   Biagio Borg, MD  diphenhydrAMINE (BENADRYL) 25 MG tablet Take 50 mg by mouth every 6 (six) hours as needed for allergies.    [provider]  famotidine (PEPCID) 20 MG tablet Take 20 mg by mouth daily.    [provider]  fluticasone-salmeterol (ADVAIR) 250-50 MCG/ACT AEPB Inhale 1 puff into the lungs every 12 (twelve) hours. 08/09/21   Tanda Rockers, MD  gabapentin (NEURONTIN) 300 MG capsule TAKE 1 CAPSULE THREE TIMES DAILY 11/28/21   Biagio Borg, MD  Guaifenesin 1200 MG TB12 Take 1 tablet by mouth 2 (two) times daily as needed (congestion).    [provider]  pantoprazole (PROTONIX) 40 MG tablet TAKE 1 TABLET ONCE DAILY 30 TO 60 MINUTES BEFORE  FIRST MEAL OF THE DAY 06/17/21   Tanda Rockers, MD  Respiratory Therapy Supplies (FLUTTER) DEVI Use as directed 12/05/17   Tanda Rockers, MD  rosuvastatin (CRESTOR) 20 MG tablet TAKE 1 TABLET EVERY DAY 11/28/21   Biagio Borg, MD  tamsulosin (FLOMAX) 0.4 MG CAPS capsule Take 0.4 mg by mouth daily. 09/04/18   [provider]  vitamin B-12 (CYANOCOBALAMIN) 1000 MCG tablet Take 1 tablet (1,000 mcg total) by mouth daily.  01/30/19   Biagio Borg, MD    Physical Exam: Vitals:   12/05/21 1047 12/05/21 1048 12/05/21 1436 12/05/21 1558  BP:   127/87 132/77  Pulse:   66 68  Resp:   18 18  Temp:  97.6 F (36.4 C)  97.8 F (36.6 C)  TempSrc:  Oral  Oral  SpO2:   97% 100%  Weight: 73 kg     Height: '5\' 7"'$  (1.702 m)       Constitutional: Elderly male currently in no Eyes:  lids and conjunctivae normal.  Decreased visual acuity out of the right eye unable to make out fingers. ENMT: Mucous membranes are moist. Posterior pharynx clear of any exudate or lesions. Neck: normal, supple Respiratory: clear to auscultation bilaterally, no wheezing, no crackles. Normal respiratory effort. No accessory muscle use.  Cardiovascular: Regular rate and rhythm, no murmurs / rubs / gallops. No extremity edema.  Abdomen: no tenderness, no masses palpated. No hepatosplenomegaly. Bowel sounds positive.  Musculoskeletal: no clubbing / cyanosis. No joint deformity upper and lower extremities. Good ROM, no contractures. Normal muscle tone.  Skin: no rashes, lesions, ulcers. No induration Neurologic: Strength 5/5 in all 4.  Psychiatric: Normal judgment and insight. Alert and oriented x 3. Normal mood.     Data Reviewed:  Reviewed labs, imaging, and pertinent records as noted above in HPI  Assessment and Plan: Central retinal artery occlusion Prior to arrival.  Patient had reported having blurry vision out of his right eye since 11/16.  Diagnosed with central retinal artery occlusion on 11/28.  CT scan of the brain did not note any acute abnormality.  MRI of the brain showed no acute abnormality. -Admit to a telemetry bed -Neurochecks -Check hemoglobin A1c and lipid panel -Check echocardiogram -PT/OT to eval and treat -Continue aspirin and Plavix -Appreciate neurology consultative services we will follow-up for any further  Hypocalcemia Acute.  Calcium 8.7 on admission. -Give calcium gluconate IV x 1 dose  CKD stage IIIb   Creatinine 1.77 with BUN 26.  Patient reports having full evacuation of his bladder -Check urinalysis -Continue to monitor kidney function.  Controlled diabetes mellitus type 2, without long-term use of insulin Last available hemoglobin A1c was 6.1on 6/29 -Hypoglycemic protocols -Continue Farxiga  COPD Patient without acute exacerbation -Breathing treatments as needed  BPH -Continue Flomax  DVT prophylaxis: Lovenox Advance Care Planning:   Code Status: Full Code   Consults: Neurology  Family Communication: None requested  Severity of Illness: The appropriate patient status for this patient is OBSERVATION. Observation status is judged to be reasonable and necessary in order to provide the required intensity of service to ensure the patient's safety. The patient's presenting symptoms, physical exam findings, and initial radiographic and laboratory data in the context of their medical condition is felt to place them at decreased risk for further clinical deterioration. Furthermore, it is anticipated that the patient will be medically stable for discharge from the hospital within 2 midnights of admission.   Author: Norval Morton, MD 12/05/2021 4:13 PM  For on call review [email or web address].

## 2021-12-05 NOTE — ED Notes (Signed)
Patient transported to MRI 

## 2021-12-05 NOTE — ED Provider Triage Note (Signed)
Emergency Medicine Provider Triage Evaluation Note  Justin Walters , a 81 y.o. male  was evaluated in triage.  Pt complains of central retinal artery occlusion of the right eye. Patient was diagnosed 1 week ago by ophthalmology and was directed to come immediately to the emergency department for a stroke workup. The patient stated that he saw too many people waiting and went home. He returns today with no new symptoms. Denies weakness, slurred speech, focal deficits other than blurry right sided vision.  Review of Systems  Positive: As above Negative: As above  Physical Exam  There were no vitals taken for this visit. Gen:   Awake, no distress   Resp:  Normal effort  MSK:   Moves extremities without difficulty  Other:  Patient with blurry right sided vision, no noted visual field deficit at this time  Medical Decision Making  Medically screening exam initiated at 10:45 AM.  Appropriate orders placed.  Justin Walters was informed that the remainder of the evaluation will be completed by another provider, this initial triage assessment does not replace that evaluation, and the importance of remaining in the ED until their evaluation is complete.     Dorothyann Peng, PA-C 12/05/21 1052

## 2021-12-06 ENCOUNTER — Observation Stay (HOSPITAL_BASED_OUTPATIENT_CLINIC_OR_DEPARTMENT_OTHER): Payer: Medicare HMO

## 2021-12-06 DIAGNOSIS — H539 Unspecified visual disturbance: Secondary | ICD-10-CM | POA: Diagnosis not present

## 2021-12-06 DIAGNOSIS — H3411 Central retinal artery occlusion, right eye: Secondary | ICD-10-CM | POA: Diagnosis not present

## 2021-12-06 DIAGNOSIS — H349 Unspecified retinal vascular occlusion: Secondary | ICD-10-CM | POA: Diagnosis not present

## 2021-12-06 LAB — RAPID URINE DRUG SCREEN, HOSP PERFORMED
Amphetamines: NOT DETECTED
Barbiturates: NOT DETECTED
Benzodiazepines: NOT DETECTED
Cocaine: NOT DETECTED
Opiates: NOT DETECTED
Tetrahydrocannabinol: POSITIVE — AB

## 2021-12-06 LAB — ECHOCARDIOGRAM COMPLETE
AR max vel: 2.62 cm2
AV Area VTI: 2.51 cm2
AV Area mean vel: 2.58 cm2
AV Mean grad: 3 mmHg
AV Peak grad: 5.7 mmHg
Ao pk vel: 1.19 m/s
Area-P 1/2: 3.99 cm2
Height: 67 in
Weight: 2574.97 oz

## 2021-12-06 LAB — URINALYSIS, ROUTINE W REFLEX MICROSCOPIC
Bilirubin Urine: NEGATIVE
Glucose, UA: >=500 mg/dL — AB
Ketones, ur: NEGATIVE mg/dL
Nitrite: NEGATIVE
Protein, ur: NEGATIVE mg/dL
Specific Gravity, Urine: 1.007 (ref 1.005–1.030)
pH: 5 (ref 5.0–8.0)

## 2021-12-06 MED ORDER — PERFLUTREN LIPID MICROSPHERE
1.0000 mL | INTRAVENOUS | Status: DC | PRN
  Administered 2021-12-06: 2 mL via INTRAVENOUS

## 2021-12-06 MED ORDER — CLOPIDOGREL BISULFATE 75 MG PO TABS: 75.0000 mg | ORAL_TABLET | Freq: Every day | ORAL | 0 refills | Status: DC

## 2021-12-06 NOTE — ED Notes (Signed)
Admitting at [facility]

## 2021-12-06 NOTE — Progress Notes (Incomplete)
Echocardiogram 2D Echocardiogram has been performed.  Justin Walters 12/06/2021, 3:49 PM

## 2021-12-06 NOTE — ED Notes (Signed)
Pt transported to Echo. 

## 2021-12-06 NOTE — ED Notes (Signed)
Pt A&OX4 ambulatory at d/c with independent steady gait. Pt verbalized understanding of d/c instructions, prescription and follow up care. 

## 2021-12-06 NOTE — Evaluation (Signed)
 Occupational Therapy Evaluation/Discharge Patient Details Name: Justin Walters MRN: [id number] DOB: [birth year] Today's Date: 12/06/2021   History of Present Illness Pt is an 81 y/o male who presented with R eye vision loss that started on 11/16 and diagnosis of central retinal artery occlusion on 11/28 at eye doctor appt with advisement to go to ED. MRI brain negative though subacute stroke suspected. PMH: HTN, HLD, nephrolithiasis, CKD3, BPH, sleep apnea.   Clinical Impression   PTA, pt lives with daughter and adult granddaughter. Pt typically Independent in all daily tasks and active in gardening/cooking. Pt presents now at functional baseline, has been ambulating to/from bathroom independently in ED, though with minor limitations due to new R eye vision deficits. Pt w/ central vision loss of R eye w/ peripheral vision intact. With B eyes uncovered, pt able to complete functional tasks well w/ reports of only minor blurriness. Collaborated with pt on compensatory strategies to use at home and safety precautions to take regarding kitchen tasks, driving, etc. Pt verbalized understanding of all education and denies any further concerns. Pt reports MD advised that vision likely not to return to baseline. No further skilled OT services needed at acute level or on DC.      Recommendations for follow up therapy are one component of a multi-disciplinary discharge planning process, led by the attending physician.  Recommendations may be updated based on patient status, additional functional criteria and insurance authorization.   Follow Up Recommendations  No OT follow up     Assistance Recommended at Discharge PRN  Patient can return home with the following Assist for transportation    Functional Status Assessment   (at functional baseline w/ new vision impairments)  Equipment Recommendations  None recommended by OT    Recommendations for Other Services       Precautions / Restrictions  Precautions Precautions: None Precaution Comments: low fall risk Restrictions Weight Bearing Restrictions: No      Mobility Bed Mobility Overal bed mobility: Modified Independent                  Transfers                          Balance                                           ADL either performed or assessed with clinical judgement   ADL Overall ADL's : Independent;Modified independent                                       General ADL Comments: Pt had just returned from ambulating from bathroom without assist, denies any concerns and nursing confirms pt has been moving well. Focus on vision assessment, education on compensatory strategies (head turning), monitoring of BP at home (in which pt already does). Pt reports MD told him vision not likely to return to baseline,     Vision Baseline Vision/History: 1 Wears glasses (reading glasses; has distance glasses) Ability to See in Adequate Light: 2 Moderately impaired Patient Visual Report: Central vision impairment;Blurring of vision Vision Assessment?: Vision impaired- to be further tested in functional context;Yes Alignment/Gaze Preference: Within Defined Limits Additional Comments: central vision impairment in R eye, able to see objects peripherally but  once moved toward center of R eye, object is lost. With B eyes uncovered, pt able to functionally see, manage tasks without issue, able to read Stroke booklet with B eyes but blurry vision when attempting to read with just R eye     Perception     Praxis      Pertinent Vitals/Pain Pain Assessment Pain Assessment: No/denies pain     Hand Dominance Right   Extremity/Trunk Assessment Upper Extremity Assessment Upper Extremity Assessment: Overall WFL for tasks assessed   Lower Extremity Assessment Lower Extremity Assessment: Defer to PT evaluation   Cervical / Trunk Assessment Cervical / Trunk Assessment:  Normal   Communication Communication Communication: No difficulties   Cognition Arousal/Alertness: Awake/alert Behavior During Therapy: WFL for tasks assessed/performed Overall Cognitive Status: Within Functional Limits for tasks assessed                                       General Comments  VSS on RA    Exercises     Shoulder Instructions      Home Living Family/patient expects to be discharged to:: Private residence Living Arrangements: Children;Other relatives (46 y/o granddaughter) Available Help at Discharge: Family Type of Home: House Home Access: Stairs to enter Technical  of Steps: 3-4 Entrance Stairs-Rails: Right;Left Home Layout: One level     Bathroom Shower/Tub: Occupational psychologist: Handicapped height (both; taller one in pt bathroom)     Home Equipment: Shower seat          Prior Functioning/Environment Prior Level of Function : Independent/Modified Independent;Driving             Mobility Comments: no use of AD ADLs Comments: driving, gardening, cooks for the holidays        OT Problem List: Impaired vision/perception      OT Treatment/Interventions:      OT Goals(Current goals can be found in the care plan section) Acute Rehab OT Goals Patient Stated Goal: go home today OT Goal Formulation: All assessment and education complete, DC therapy  OT Frequency:      Co-evaluation              AM-PAC OT "6 Clicks" Daily Activity     Outcome Measure Help from another person eating meals?: None Help from another person taking care of personal grooming?: None Help from another person toileting, which includes using toliet, bedpan, or urinal?: None Help from another person bathing (including washing, rinsing, drying)?: None Help from another person to put on and taking off regular upper body clothing?: None Help from another person to put on and taking off regular lower body clothing?: None 6  Click Score: 24   End of Session Nurse Communication: Mobility status  Activity Tolerance: Patient tolerated treatment well Patient left: in bed;with call bell/phone within reach  OT Visit Diagnosis: Low vision, both eyes (H54.2)                Time: 9417-4081 OT Time Calculation (min): 18 min Charges:  OT General Charges $OT Visit: 1 Visit OT Evaluation $OT Eval Low Complexity: 1 Low  Malachy Chamber, OTR/L Acute Rehab Services Office: [phone]   Layla Maw 12/06/2021, 10:37 AM

## 2021-12-06 NOTE — Evaluation (Signed)
Physical Therapy Evaluation Patient Details Name: Justin Walters MRN: [id number] DOB: 11-04-1940 Today's Date: 12/06/2021  History of Present Illness  Pt is an 81 y/o male who presented with R eye vision loss that started on 11/16 and diagnosis of central retinal artery occlusion on 11/28 at eye doctor appt with advisement to go to ED- pt presented to ED on 12/05/21 for further work up. MRI brain negative though subacute stroke suspected. PMH: HTN, HLD, nephrolithiasis, CKD3, BPH, sleep apnea.  Clinical Impression  Pt admitted with above diagnosis.  Other than the change in his vision he has noted no other deficits and pt compensating well for visual deficits (reports blurry central vision).  Pt demonstrated independent and safe mobility.  He demonstrated good strength, coordination, sensation, and balance.    No further PT needs.      Recommendations for follow up therapy are one component of a multi-disciplinary discharge planning process, led by the attending physician.  Recommendations may be updated based on patient status, additional functional criteria and insurance authorization.  Follow Up Recommendations No PT follow up      Assistance Recommended at Discharge None  Patient can return home with the following       Equipment Recommendations None recommended by PT  Recommendations for Other Services       Functional Status Assessment Patient has not had a recent decline in their functional status     Precautions / Restrictions Precautions Precautions: None Precaution Comments: low fall risk Restrictions Weight Bearing Restrictions: No      Mobility  Bed Mobility Overal bed mobility: Independent                  Transfers Overall transfer level: Independent Equipment used: None               General transfer comment: Demonstrated safe sit to stand    Ambulation/Gait Ambulation/Gait assistance: Independent Gait Distance (Feet): 400 Feet Assistive  device: None Gait Pattern/deviations: Decreased stance time - right Gait velocity: normal     General Gait Details: Pt has been ambulating to restroom independently. He demonstrated safe ambulation with good balance and speed.  Pt had slight decrease in stance time on R reports baseline from foot crush injury in the past.  Stairs            Wheelchair Mobility    Modified Rankin (Stroke Patients Only) Modified Rankin (Stroke Patients Only) Pre-Morbid Rankin Score: No symptoms Modified Rankin: No significant disability     Balance Overall balance assessment: Independent   Sitting balance-Leahy Scale: Normal       Standing balance-Leahy Scale: Normal               High level balance activites: Side stepping, Direction changes, Turns, Sudden stops, Head turns High Level Balance Comments: Demonstrates above activities and marching, navigating obstacles, stepping over obstacles without LOB.  Pt donned boots in standing (did have UE support for safety)             Pertinent Vitals/Pain Pain Assessment Pain Assessment: No/denies pain    Home Living Family/patient expects to be discharged to:: Private residence Living Arrangements: Children;Other relatives (lives with 68 yo granddaughter) Available Help at Discharge: Family Type of Home: House Home Access: Stairs to enter Entrance Stairs-Rails: Psychiatric nurse of Steps: 3-4   Home Layout: One level Home Equipment: Shower seat      Prior Function Prior Level of Function : Independent/Modified Independent;Driving  Mobility Comments: no use of AD ADLs Comments: driving, gardening, cooks for the holidays     Hand Dominance   Dominant Hand: Right    Extremity/Trunk Assessment   Upper Extremity Assessment Upper Extremity Assessment: Overall WFL for tasks assessed    Lower Extremity Assessment Lower Extremity Assessment: Overall WFL for tasks assessed (ROM WFL; MMT  5/5; coordination normal; sensation intact)    Cervical / Trunk Assessment Cervical / Trunk Assessment: Normal  Communication   Communication: No difficulties  Cognition Arousal/Alertness: Awake/alert Behavior During Therapy: WFL for tasks assessed/performed Overall Cognitive Status: Within Functional Limits for tasks assessed                                          General Comments General comments (skin integrity, edema, etc.): VSS on RA    Exercises     Assessment/Plan    PT Assessment Patient does not need any further PT services  PT Problem List         PT Treatment Interventions      PT Goals (Current goals can be found in the Care Plan section)  Acute Rehab PT Goals Patient Stated Goal: return home PT Goal Formulation: All assessment and education complete, DC therapy    Frequency       Co-evaluation               AM-PAC PT "6 Clicks" Mobility  Outcome Measure Help needed turning from your back to your side while in a flat bed without using bedrails?: None Help needed moving from lying on your back to sitting on the side of a flat bed without using bedrails?: None Help needed moving to and from a bed to a chair (including a wheelchair)?: None Help needed standing up from a chair using your arms (e.g., wheelchair or bedside chair)?: None Help needed to walk in hospital room?: None Help needed climbing 3-5 steps with a railing? : None 6 Click Score: 24    End of Session   Activity Tolerance: Patient tolerated treatment well Patient left: in bed;with call bell/phone within reach Nurse Communication: Mobility status PT Visit Diagnosis: Other symptoms and signs involving the nervous system (R29.898)    Time: 7948-0165 PT Time Calculation (min) (ACUTE ONLY): 12 min   Charges:   PT Evaluation $PT Eval Low Complexity: 1 Low          Orian Figueira, PT Acute Rehab [facility] [phone]   Karlton Lemon 12/06/2021,  12:40 PM

## 2021-12-06 NOTE — ED Notes (Signed)
The patient has returned from Endo

## 2021-12-06 NOTE — Discharge Summary (Signed)
Physician Discharge Summary  Justin Walters MGQ:[id number] DOB: 10-17-1940 DOA: 12/05/2021  PCP: Biagio Borg, MD  Admit date: 12/05/2021 Discharge date: 12/06/2021  Admitted From: Home Disposition:  Home  Recommendations for Outpatient Follow-up:  Follow up with PCP in 1-2 weeks Follow-up with neurology as scheduled  Home Health: None Equipment/Devices: None  Discharge Condition: Stable CODE STATUS: Full Diet recommendation: Low-salt low-fat diet.  Brief/Interim Summary: Justin Walters is a 81 y.o. male with medical history significant of hypertension, hyperlipidemia, nephrolithiasis, CKD stage IIIb, BPH, sleep apnea who presents with blurry vision in his right eye since 11/16.  Patient had went to see his eye doctor on 11/28 and was diagnosed with central retinal artery occlusion of the right eye.  He was advised to come to the hospital at that time, but states that the wait times were too long.  He returned to the hospital for further evaluation on the fourth.  Hospitalist called for admission, neurology called for consult.  Patient mid as above given acute vision loss nearly a month ago concerning to be retinal artery occlusion, MRI is indeed negative for stroke making retinal artery occlusion most likely diagnosis.  At this time he is doing quite well PT OT, as above MRI and MRA are unremarkable for acute findings stenosis or thrombosis.  CT also negative at intake.  At this time patient is likely at his new baseline with vision loss of the right eye given its timeframe.  Will continue medication changes as below with outpatient follow-up with PCP and neurology as scheduled   Discharge Diagnoses:  Principal Problem:   Retinal artery occlusion Active Problems:   Hypocalcemia   CKD (chronic kidney disease) stage 3, GFR 30-59 ml/min (HCC)   Controlled type 2 diabetes mellitus without complication, without long-term current use of insulin (HCC)   COPD (chronic obstructive pulmonary  disease) (HCC)   BPH (benign prostatic hyperplasia)    Discharge Instructions  Discharge Instructions     Ambulatory referral to Neurology   Complete by: As directed    An appointment is requested in approximately: 8 weeks      Allergies as of 12/06/2021   No Known Allergies      Medication List     STOP taking these medications    fluticasone-salmeterol 250-50 MCG/ACT Aepb Commonly known as: ADVAIR       TAKE these medications    acetaminophen 325 MG tablet Commonly known as: TYLENOL Take 650 mg by mouth every 6 (six) hours as needed for mild pain or headache.   albuterol 108 (90 Base) MCG/ACT inhaler Commonly known as: VENTOLIN HFA INHALE 2 PUFFS INTO THE LUNGS EVERY 6 (SIX) HOURS AS NEEDED FOR WHEEZING OR SHORTNESS OF BREATH.   aspirin EC 81 MG tablet Take 1 tablet (81 mg total) by mouth daily. Swallow whole. What changed: when to take this   cholecalciferol 25 MCG (1000 UNIT) tablet Commonly known as: VITAMIN D3 Take 2,000 Units by mouth daily.   clopidogrel 75 MG tablet Commonly known as: PLAVIX Take 1 tablet (75 mg total) by mouth daily. Start taking on: December 07, 2021   cyanocobalamin 1000 MCG tablet Commonly known as: VITAMIN B12 Take 1 tablet (1,000 mcg total) by mouth daily.   dapagliflozin propanediol 5 MG Tabs tablet Commonly known as: Farxiga Take 1 tablet (5 mg total) by mouth daily before breakfast.   famotidine 20 MG tablet Commonly known as: PEPCID Take 20 mg by mouth daily.   Flutter Devi Use  as directed   gabapentin 300 MG capsule Commonly known as: NEURONTIN TAKE 1 CAPSULE THREE TIMES DAILY What changed: See the new instructions.   Guaifenesin 1200 MG Tb12 Take 1 tablet by mouth 2 (two) times daily as needed (congestion).   MELATONIN PO Take 1 tablet by mouth at bedtime.   pantoprazole 40 MG tablet Commonly known as: PROTONIX TAKE 1 TABLET ONCE DAILY 30 TO 60 MINUTES BEFORE  FIRST MEAL OF THE DAY What changed: See  the new instructions.   predniSONE 10 MG tablet Commonly known as: DELTASONE Take by mouth See admin instructions. TAKE 2 TABLETS BY MOUTH UNTIL BETTER, THEN TAKE 1 TABLET BY MOUTH ONCE DAILY FOR 5 DAYS AND STOP   rosuvastatin 20 MG tablet Commonly known as: CRESTOR TAKE 1 TABLET EVERY DAY What changed: when to take this   tamsulosin 0.4 MG Caps capsule Commonly known as: FLOMAX Take 0.4 mg by mouth every evening.   Trelegy Ellipta 100-62.5-25 MCG/ACT Aepb Generic drug: Fluticasone-Umeclidin-Vilant Inhale 1 puff into the lungs daily.        No Known Allergies  Consultations: Neuro  Procedures/Studies: ECHOCARDIOGRAM COMPLETE  Result Date: 12/06/2021    ECHOCARDIOGRAM REPORT   Patient Name:   Justin Walters Date of Exam: 12/06/2021 Medical Rec #:  [id number]     Height:       67.0 in Accession #:    [id number]    Weight:       160.9 lb Date of Birth:  Mar 29, 1940      BSA:          1.844 m Patient Age:    43 years      BP:           109/70 mmHg Patient Gender: M             HR:           66 bpm. Exam Location:  Inpatient Procedure: 2D Echo, Cardiac Doppler, Color Doppler and Intracardiac            Opacification Agent Indications:    Stroke I63.9  History:        Patient has no prior history of Echocardiogram examinations.                 COPD, Signs/Symptoms:Chest Pain and Dyspnea; Risk                 Factors:Hypertension, Diabetes and Dyslipidemia. CKD (chronic                 kidney disease) stage 3.  Sonographer:    Ronny Flurry Referring Phys: [id number] [place]  1. Left ventricular ejection fraction, by estimation, is 55 to 60%. The left ventricle has normal function. The left ventricle has no regional wall motion abnormalities. Left ventricular diastolic parameters are consistent with Grade I diastolic dysfunction (impaired relaxation).  2. Right ventricular systolic function is normal. The right ventricular size is normal. Tricuspid regurgitation signal is  inadequate for assessing PA pressure.  3. The mitral valve is grossly normal. Trivial mitral valve regurgitation. No evidence of mitral stenosis.  4. The aortic valve is tricuspid. There is moderate calcification of the aortic valve. There is moderate thickening of the aortic valve. Aortic valve regurgitation is not visualized. Aortic valve sclerosis/calcification is present, without any evidence of aortic stenosis.  5. The inferior vena cava is normal in size with greater than 50% respiratory variability, suggesting right atrial pressure of 3  mmHg. Conclusion(s)/Recommendation(s): No intracardiac source of embolism detected on this transthoracic study. Consider a transesophageal echocardiogram to exclude cardiac source of embolism if clinically indicated. FINDINGS  Left Ventricle: Left ventricular ejection fraction, by estimation, is 55 to 60%. The left ventricle has normal function. The left ventricle has no regional wall motion abnormalities. Definity contrast agent was given IV to delineate the left ventricular  endocardial borders. The left ventricular internal cavity size was normal in size. There is no left ventricular hypertrophy. Left ventricular diastolic parameters are consistent with Grade I diastolic dysfunction (impaired relaxation). Right Ventricle: The right ventricular size is normal. No increase in right ventricular wall thickness. Right ventricular systolic function is normal. Tricuspid regurgitation signal is inadequate for assessing PA pressure. Left Atrium: Left atrial size was normal in size. Right Atrium: Right atrial size was normal in size. Pericardium: There is no evidence of pericardial effusion. Mitral Valve: The mitral valve is grossly normal. Trivial mitral valve regurgitation. No evidence of mitral valve stenosis. Tricuspid Valve: The tricuspid valve is grossly normal. Tricuspid valve regurgitation is trivial. No evidence of tricuspid stenosis. Aortic Valve: The aortic valve is  tricuspid. There is moderate calcification of the aortic valve. There is moderate thickening of the aortic valve. Aortic valve regurgitation is not visualized. Aortic valve sclerosis/calcification is present, without any  evidence of aortic stenosis. Aortic valve mean gradient measures 3.0 mmHg. Aortic valve peak gradient measures 5.7 mmHg. Aortic valve area, by VTI measures 2.51 cm. Pulmonic Valve: The pulmonic valve was grossly normal. Pulmonic valve regurgitation is not visualized. No evidence of pulmonic stenosis. Aorta: The aortic root is normal in size and structure. Venous: The right lower pulmonary vein is normal. The inferior vena cava is normal in size with greater than 50% respiratory variability, suggesting right atrial pressure of 3 mmHg. IAS/Shunts: The atrial septum is grossly normal.  LEFT VENTRICLE PLAX 2D LVOT diam:     1.90 cm   Diastology LV SV:         64        LV e' medial:    6.53 cm/s LV SV Index:   35        LV E/e' medial:  13.2 LVOT Area:     2.84 cm  LV e' lateral:   9.25 cm/s                          LV E/e' lateral: 9.3  RIGHT VENTRICLE RV S prime:     8.16 cm/s LEFT ATRIUM             Index        RIGHT ATRIUM          Index LA Vol (A2C):   33.2 ml 18.01 ml/m  RA Area:     9.48 cm LA Vol (A4C):   32.4 ml 17.57 ml/m  RA Volume:   16.50 ml 8.95 ml/m LA Biplane Vol: 33.1 ml 17.95 ml/m  AORTIC VALVE AV Area (Vmax):    2.62 cm AV Area (Vmean):   2.58 cm AV Area (VTI):     2.51 cm AV Vmax:           119.00 cm/s AV Vmean:          77.400 cm/s AV VTI:            0.255 m AV Peak Grad:      5.7 mmHg AV Mean Grad:      3.0  mmHg LVOT Vmax:         110.00 cm/s LVOT Vmean:        70.550 cm/s LVOT VTI:          0.225 m LVOT/AV VTI ratio: 0.88  AORTA Ao Root diam: 3.80 cm MITRAL VALVE MV Area (PHT): 3.99 cm     SHUNTS MV Decel Time: 190 msec     Systemic VTI:  0.23 m MV E velocity: 86.40 cm/s   Systemic Diam: 1.90 cm MV A velocity: 109.00 cm/s MV E/A ratio:  0.79 Eleonore Chiquito MD  Electronically signed by Eleonore Chiquito MD Signature Date/Time: 12/06/2021/4:25:23 PM    Final    VAS US CAROTID  Result Date: 12/06/2021 Carotid Arterial Duplex Study Patient Name:  Justin Walters  Date of Exam:   12/06/2021 Medical Rec #: [id number]      Accession #:    [id number] Date of Birth: 10/22/1940       Patient Gender: M Patient Age:   38 years Exam Location:  [facility] Procedure:      VAS US CAROTID Referring Phys: Elwin Sleight DE [place] T[place] --------------------------------------------------------------------------------  Indications:       Visual disturbance. Risk Factors:      Hyperlipidemia, Diabetes, past history of smoking. Other Factors:     CKD3. Comparison Study:  No previous exams Performing Technologist: Jody Hill RVT, RDMS  Examination Guidelines: A complete evaluation includes B-mode imaging, spectral Doppler, color Doppler, and power Doppler as needed of all accessible portions of each vessel. Bilateral testing is considered an integral part of a complete examination. Limited examinations for reoccurring indications may be performed as noted.  Right Carotid Findings: +----------+--------+--------+--------+------------------+--------+           PSV cm/sEDV cm/sStenosisPlaque DescriptionComments +----------+--------+--------+--------+------------------+--------+ CCA Prox  64      9                                          +----------+--------+--------+--------+------------------+--------+ CCA Distal53      14                                         +----------+--------+--------+--------+------------------+--------+ ICA Prox  100     31      1-39%   calcific                   +----------+--------+--------+--------+------------------+--------+ ICA Distal82      31                                         +----------+--------+--------+--------+------------------+--------+ ECA       81      10                                          +----------+--------+--------+--------+------------------+--------+ +----------+--------+-------+----------------+-------------------+           PSV cm/sEDV cmsDescribe        Arm Pressure (mmHG) +----------+--------+-------+----------------+-------------------+ [id number]            Multiphasic, WNL                    +----------+--------+-------+----------------+-------------------+ +---------+--------+--+--------+--+---------+  VertebralPSV cm/s55EDV cm/s13Antegrade +---------+--------+--+--------+--+---------+  Left Carotid Findings: +----------+--------+--------+--------+------------------+--------+           PSV cm/sEDV cm/sStenosisPlaque DescriptionComments +----------+--------+--------+--------+------------------+--------+ CCA Prox  61      14                                         +----------+--------+--------+--------+------------------+--------+ CCA Distal71      17                                         +----------+--------+--------+--------+------------------+--------+ ICA Prox  155     50      40-59%  calcific                   +----------+--------+--------+--------+------------------+--------+ ICA Mid   119     36                                         +----------+--------+--------+--------+------------------+--------+ ICA Distal70      17                                         +----------+--------+--------+--------+------------------+--------+ ECA       120     16                                         +----------+--------+--------+--------+------------------+--------+ +----------+--------+--------+----------------+-------------------+           PSV cm/sEDV cm/sDescribe        Arm Pressure (mmHG) +----------+--------+--------+----------------+-------------------+ [id number]              Multiphasic, WNL                    +----------+--------+--------+----------------+-------------------+  +---------+--------+--+--------+--+---------+ VertebralPSV cm/s36EDV cm/s10Antegrade +---------+--------+--+--------+--+---------+   Summary: Right Carotid: Velocities in the right ICA are consistent with a 1-39% stenosis. Left Carotid: Velocities in the left ICA are consistent with a 40-59% stenosis. Vertebrals:  Bilateral vertebral arteries demonstrate antegrade flow. Subclavians: Normal flow hemodynamics were seen in bilateral subclavian              arteries. *See table(s) above for measurements and observations.     Preliminary    MR BRAIN WO CONTRAST  Result Date: 12/05/2021 CLINICAL DATA:  Stroke suspected; blurred vision in right eye EXAM: MRI HEAD WITHOUT CONTRAST MRA HEAD WITHOUT CONTRAST TECHNIQUE: Multiplanar, multi-echo pulse sequences of the brain and surrounding structures were acquired without intravenous contrast. Angiographic images of the Circle of Willis were acquired using MRA technique without intravenous contrast. COMPARISON:  MRI head 09/14/2014, no prior MRA a; correlation is also made with CT head 12/05/2021 FINDINGS: MRI HEAD FINDINGS Brain: No restricted diffusion to suggest acute or subacute infarct. No acute hemorrhage, mass, mass effect, or midline shift. No hydrocephalus or extra-axial collection. Cerebral volume is normal for age. No hemosiderin deposition to suggest remote hemorrhage. Scattered T2 hyperintense signal in the periventricular white matter, likely the sequela of mild chronic small vessel ischemic disease. Vascular: Normal arterial flow voids. Skull and upper cervical  spine: Normal marrow signal. Degenerative changes in the cervical spine, with partial osseous fusion of C2 and C3 and of C4 and C5. Moderate spinal canal stenosis at C3-C4. Sinuses/Orbits: Clear paranasal sinuses. Status post bilateral lens replacements. Other: Trace fluid in the right mastoid air cells. MRA HEAD FINDINGS Anterior circulation: Both internal carotid arteries are patent to the  termini, without significant stenosis. A1 segments patent. Normal anterior communicating artery. Anterior cerebral arteries are patent to their distal aspects. No M1 stenosis or occlusion. Normal MCA bifurcations. Distal MCA branches perfused and symmetric. Posterior circulation: Vertebral arteries patent to the vertebrobasilar junction without stenosis. Posterior inferior cerebral arteries patent bilaterally. Basilar patent to its distal aspect. Superior cerebellar arteries patent bilaterally. Patent P1 segments. PCAs perfused to their distal aspects without stenosis. The bilateral posterior communicating arteries are not visualized. Anatomic variants: None significant IMPRESSION: 1. No acute intracranial process. No evidence of acute or subacute infarct. 2. No intracranial large vessel occlusion or significant stenosis. Electronically Signed   By: Merilyn Baba M.D.   On: 12/05/2021 19:45   MR ANGIO HEAD WO CONTRAST  Result Date: 12/05/2021 CLINICAL DATA:  Stroke suspected; blurred vision in right eye EXAM: MRI HEAD WITHOUT CONTRAST MRA HEAD WITHOUT CONTRAST TECHNIQUE: Multiplanar, multi-echo pulse sequences of the brain and surrounding structures were acquired without intravenous contrast. Angiographic images of the Circle of Willis were acquired using MRA technique without intravenous contrast. COMPARISON:  MRI head 09/14/2014, no prior MRA a; correlation is also made with CT head 12/05/2021 FINDINGS: MRI HEAD FINDINGS Brain: No restricted diffusion to suggest acute or subacute infarct. No acute hemorrhage, mass, mass effect, or midline shift. No hydrocephalus or extra-axial collection. Cerebral volume is normal for age. No hemosiderin deposition to suggest remote hemorrhage. Scattered T2 hyperintense signal in the periventricular white matter, likely the sequela of mild chronic small vessel ischemic disease. Vascular: Normal arterial flow voids. Skull and upper cervical spine: Normal marrow signal.  Degenerative changes in the cervical spine, with partial osseous fusion of C2 and C3 and of C4 and C5. Moderate spinal canal stenosis at C3-C4. Sinuses/Orbits: Clear paranasal sinuses. Status post bilateral lens replacements. Other: Trace fluid in the right mastoid air cells. MRA HEAD FINDINGS Anterior circulation: Both internal carotid arteries are patent to the termini, without significant stenosis. A1 segments patent. Normal anterior communicating artery. Anterior cerebral arteries are patent to their distal aspects. No M1 stenosis or occlusion. Normal MCA bifurcations. Distal MCA branches perfused and symmetric. Posterior circulation: Vertebral arteries patent to the vertebrobasilar junction without stenosis. Posterior inferior cerebral arteries patent bilaterally. Basilar patent to its distal aspect. Superior cerebellar arteries patent bilaterally. Patent P1 segments. PCAs perfused to their distal aspects without stenosis. The bilateral posterior communicating arteries are not visualized. Anatomic variants: None significant IMPRESSION: 1. No acute intracranial process. No evidence of acute or subacute infarct. 2. No intracranial large vessel occlusion or significant stenosis. Electronically Signed   By: Merilyn Baba M.D.   On: 12/05/2021 19:45   CT Head Wo Contrast  Result Date: 12/05/2021 CLINICAL DATA:  Neuro deficit, acute, stroke suspected. Blurred vision right eye. EXAM: CT HEAD WITHOUT CONTRAST TECHNIQUE: Contiguous axial images were obtained from the base of the skull through the vertex without intravenous contrast. RADIATION DOSE REDUCTION: This exam was performed according to the departmental dose-optimization program which includes automated exposure control, adjustment of the mA and/or kV according to patient size and/or use of iterative reconstruction technique. COMPARISON:  MRI 09/14/2014 FINDINGS: Brain: Age related volume loss,  possibly frontal predominant. Old small vessel infarction in the  left frontoparietal white matter as shown on the prior MRI. No evidence of advanced white matter changes. No sign of cortical or large vessel territory infarction. No mass, hemorrhage, hydrocephalus or extra-axial collection. Vascular: There is atherosclerotic calcification of the major vessels at the base of the brain. Skull: Negative Sinuses/Orbits: Clear/normal Other: None IMPRESSION: No acute CT finding. Age related volume loss, possibly frontal predominant. Old small vessel infarction in the left frontoparietal white matter. No CT evidence of advanced/widespread small-vessel disease. Electronically Signed   By: Nelson Chimes M.D.   On: 12/05/2021 13:35     Subjective: No acute issues or events overnight, continues to complain of right vision loss but this is stable from prior.  Otherwise no deficits denies nausea vomiting diarrhea constipation headache fevers chills or chest pain   Discharge Exam: Vitals:   12/06/21 1200 12/06/21 1620  BP: 109/70 131/79  Pulse: 71 63  Resp: 19 17  Temp:    SpO2: 93% 97%   Vitals:   12/06/21 0742 12/06/21 0800 12/06/21 1200 12/06/21 1620  BP: 131/83 124/86 109/70 131/79  Pulse: 66 (!) 104 71 63  Resp: '19 17 19 17  '$ Temp: 98.9 F (37.2 C)     TempSrc: Oral     SpO2: 97%  93% 97%  Weight:      Height:       General:  Pleasantly resting in bed, No acute distress. HEENT:  Normocephalic atraumatic.  Complete right visual field deficit noted. Neck:  Without mass or deformity.  Trachea is midline. Lungs:  Clear to auscultate bilaterally without rhonchi, wheeze, or rales. Heart:  Regular rate and rhythm.  Without murmurs, rubs, or gallops. Abdomen:  Soft, nontender, nondistended.  Without guarding or rebound. Extremities: Without cyanosis, clubbing, edema, or obvious deformity. Vascular:  Dorsalis pedis and posterior tibial pulses palpable bilaterally. Skin:  Warm and dry, no erythema, no ulcerations.   The results of significant diagnostics from this  hospitalization (including imaging, microbiology, ancillary and laboratory) are listed below for reference.     Microbiology: No results found for this or any previous visit (from the past 240 hour(s)).   Labs: BNP (last 3 results) No results for input(s): "BNP" in the last 8760 hours. Basic Metabolic Panel: Recent Labs  Lab 12/05/21 1053 12/05/21 1119  NA 138 140  K 4.1 3.9  CL 107 104  CO2 25  --   GLUCOSE 96 88  BUN 26* 28*  CREATININE 1.77* 1.90*  CALCIUM 8.7*  --    Liver Function Tests: Recent Labs  Lab 12/05/21 1053  AST 15  ALT 13  ALKPHOS 47  BILITOT 0.6  PROT 6.5  ALBUMIN 3.8   No results for input(s): "LIPASE", "AMYLASE" in the last 168 hours. No results for input(s): "AMMONIA" in the last 168 hours. CBC: Recent Labs  Lab 12/05/21 1053 12/05/21 1119  WBC 5.8  --   NEUTROABS 3.8  --   HGB 13.5 15.0  HCT 42.4 44.0  MCV 84.0  --   PLT 187  --    Cardiac Enzymes: No results for input(s): "CKTOTAL", "CKMB", "CKMBINDEX", "TROPONINI" in the last 168 hours. BNP: Invalid input(s): "POCBNP" CBG: No results for input(s): "GLUCAP" in the last 168 hours. D-Dimer No results for input(s): "DDIMER" in the last 72 hours. Hgb A1c No results for input(s): "HGBA1C" in the last 72 hours. Lipid Profile No results for input(s): "CHOL", "HDL", "LDLCALC", "TRIG", "CHOLHDL", "LDLDIRECT" in  the last 72 hours. Thyroid function studies No results for input(s): "TSH", "T4TOTAL", "T3FREE", "THYROIDAB" in the last 72 hours.  Invalid input(s): "FREET3" Anemia work up No results for input(s): "VITAMINB12", "FOLATE", "FERRITIN", "TIBC", "IRON", "RETICCTPCT" in the last 72 hours. Urinalysis    Component Value Date/Time   COLORURINE YELLOW 12/06/2021 0307   APPEARANCEUR HAZY (A) 12/06/2021 0307   LABSPEC 1.007 12/06/2021 0307   PHURINE 5.0 12/06/2021 0307   GLUCOSEU >=500 (A) 12/06/2021 0307   GLUCOSEU NEGATIVE 01/04/2021 0740   HGBUR SMALL (A) 12/06/2021 0307    HGBUR moderate 07/26/2007 0924   BILIRUBINUR NEGATIVE 12/06/2021 0307   BILIRUBINUR 2 05/20/2019 0830   KETONESUR NEGATIVE 12/06/2021 0307   PROTEINUR NEGATIVE 12/06/2021 0307   UROBILINOGEN 0.2 01/04/2021 0740   NITRITE NEGATIVE 12/06/2021 0307   LEUKOCYTESUR SMALL (A) 12/06/2021 0307   Sepsis Labs Recent Labs  Lab 12/05/21 1053  WBC 5.8   Microbiology No results found for this or any previous visit (from the past 240 hour(s)).   Time coordinating discharge: Over 30 minutes  SIGNED:   Little Ishikawa, DO [facility] 12/06/2021, 7:57 PM Pager   If 7PM-7AM, please contact night-coverage [email or web address]

## 2021-12-06 NOTE — Progress Notes (Signed)
Carotid duplex has been completed.   Results can be found under chart review under CV PROC. 12/06/2021 3:34 PM Jaqwon Manfred RVT, RDMS

## 2021-12-06 NOTE — Progress Notes (Addendum)
STROKE TEAM PROGRESS NOTE   INTERVAL HISTORY Patient seen at bedside. He is in good spirits. Notable deficits in R eye but otherwise neuro exam normal - see neuro exam section below. He presented with blurred vision in the right eye from 2-1/2 weeks ago and was seen by her eye doctor and found to have central retinal artery occlusion and sent to the hospital for evaluation for risk for stroke.  MRI is negative for acute infarct.  CT angiogram shows no large vessel stenosis or occlusion. Vitals:   12/06/21 0314 12/06/21 0315 12/06/21 0742 12/06/21 0800  BP: 130/78  131/83 124/86  Pulse: 67  66 (!) 104  Resp: '13  19 17  '$ Temp:  (!) 97.5 F (36.4 C) 98.9 F (37.2 C)   TempSrc:  Oral Oral   SpO2: 94%  97%   Weight:      Height:       CBC:  Recent Labs  Lab 12/05/21 1053 12/05/21 1119  WBC 5.8  --   NEUTROABS 3.8  --   HGB 13.5 15.0  HCT 42.4 44.0  MCV 84.0  --   PLT 187  --    Basic Metabolic Panel:  Recent Labs  Lab 12/05/21 1053 12/05/21 1119  NA 138 140  K 4.1 3.9  CL 107 104  CO2 25  --   GLUCOSE 96 88  BUN 26* 28*  CREATININE 1.77* 1.90*  CALCIUM 8.7*  --    Lipid Panel: No results for input(s): "CHOL", "TRIG", "HDL", "CHOLHDL", "VLDL", "LDLCALC" in the last 168 hours. HgbA1c: No results for input(s): "HGBA1C" in the last 168 hours. Urine Drug Screen:  Recent Labs  Lab 12/06/21 0307  LABOPIA NONE DETECTED  COCAINSCRNUR NONE DETECTED  LABBENZ NONE DETECTED  AMPHETMU NONE DETECTED  THCU POSITIVE*  LABBARB NONE DETECTED    Alcohol Level  Recent Labs  Lab 12/05/21 1053  ETH <10    IMAGING past 24 hours MR BRAIN WO CONTRAST  Result Date: 12/05/2021 CLINICAL DATA:  Stroke suspected; blurred vision in right eye EXAM: MRI HEAD WITHOUT CONTRAST MRA HEAD WITHOUT CONTRAST TECHNIQUE: Multiplanar, multi-echo pulse sequences of the brain and surrounding structures were acquired without intravenous contrast. Angiographic images of the Circle of Willis were acquired  using MRA technique without intravenous contrast. COMPARISON:  MRI head 09/14/2014, no prior MRA a; correlation is also made with CT head 12/05/2021 FINDINGS: MRI HEAD FINDINGS Brain: No restricted diffusion to suggest acute or subacute infarct. No acute hemorrhage, mass, mass effect, or midline shift. No hydrocephalus or extra-axial collection. Cerebral volume is normal for age. No hemosiderin deposition to suggest remote hemorrhage. Scattered T2 hyperintense signal in the periventricular white matter, likely the sequela of mild chronic small vessel ischemic disease. Vascular: Normal arterial flow voids. Skull and upper cervical spine: Normal marrow signal. Degenerative changes in the cervical spine, with partial osseous fusion of C2 and C3 and of C4 and C5. Moderate spinal canal stenosis at C3-C4. Sinuses/Orbits: Clear paranasal sinuses. Status post bilateral lens replacements. Other: Trace fluid in the right mastoid air cells. MRA HEAD FINDINGS Anterior circulation: Both internal carotid arteries are patent to the termini, without significant stenosis. A1 segments patent. Normal anterior communicating artery. Anterior cerebral arteries are patent to their distal aspects. No M1 stenosis or occlusion. Normal MCA bifurcations. Distal MCA branches perfused and symmetric. Posterior circulation: Vertebral arteries patent to the vertebrobasilar junction without stenosis. Posterior inferior cerebral arteries patent bilaterally. Basilar patent to its distal aspect. Superior cerebellar arteries  patent bilaterally. Patent P1 segments. PCAs perfused to their distal aspects without stenosis. The bilateral posterior communicating arteries are not visualized. Anatomic variants: None significant IMPRESSION: 1. No acute intracranial process. No evidence of acute or subacute infarct. 2. No intracranial large vessel occlusion or significant stenosis. Electronically Signed   By: Merilyn Baba M.D.   On: 12/05/2021 19:45   MR ANGIO  HEAD WO CONTRAST  Result Date: 12/05/2021 CLINICAL DATA:  Stroke suspected; blurred vision in right eye EXAM: MRI HEAD WITHOUT CONTRAST MRA HEAD WITHOUT CONTRAST TECHNIQUE: Multiplanar, multi-echo pulse sequences of the brain and surrounding structures were acquired without intravenous contrast. Angiographic images of the Circle of Willis were acquired using MRA technique without intravenous contrast. COMPARISON:  MRI head 09/14/2014, no prior MRA a; correlation is also made with CT head 12/05/2021 FINDINGS: MRI HEAD FINDINGS Brain: No restricted diffusion to suggest acute or subacute infarct. No acute hemorrhage, mass, mass effect, or midline shift. No hydrocephalus or extra-axial collection. Cerebral volume is normal for age. No hemosiderin deposition to suggest remote hemorrhage. Scattered T2 hyperintense signal in the periventricular white matter, likely the sequela of mild chronic small vessel ischemic disease. Vascular: Normal arterial flow voids. Skull and upper cervical spine: Normal marrow signal. Degenerative changes in the cervical spine, with partial osseous fusion of C2 and C3 and of C4 and C5. Moderate spinal canal stenosis at C3-C4. Sinuses/Orbits: Clear paranasal sinuses. Status post bilateral lens replacements. Other: Trace fluid in the right mastoid air cells. MRA HEAD FINDINGS Anterior circulation: Both internal carotid arteries are patent to the termini, without significant stenosis. A1 segments patent. Normal anterior communicating artery. Anterior cerebral arteries are patent to their distal aspects. No M1 stenosis or occlusion. Normal MCA bifurcations. Distal MCA branches perfused and symmetric. Posterior circulation: Vertebral arteries patent to the vertebrobasilar junction without stenosis. Posterior inferior cerebral arteries patent bilaterally. Basilar patent to its distal aspect. Superior cerebellar arteries patent bilaterally. Patent P1 segments. PCAs perfused to their distal aspects  without stenosis. The bilateral posterior communicating arteries are not visualized. Anatomic variants: None significant IMPRESSION: 1. No acute intracranial process. No evidence of acute or subacute infarct. 2. No intracranial large vessel occlusion or significant stenosis. Electronically Signed   By: Merilyn Baba M.D.   On: 12/05/2021 19:45   CT Head Wo Contrast  Result Date: 12/05/2021 CLINICAL DATA:  Neuro deficit, acute, stroke suspected. Blurred vision right eye. EXAM: CT HEAD WITHOUT CONTRAST TECHNIQUE: Contiguous axial images were obtained from the base of the skull through the vertex without intravenous contrast. RADIATION DOSE REDUCTION: This exam was performed according to the departmental dose-optimization program which includes automated exposure control, adjustment of the mA and/or kV according to patient size and/or use of iterative reconstruction technique. COMPARISON:  MRI 09/14/2014 FINDINGS: Brain: Age related volume loss, possibly frontal predominant. Old small vessel infarction in the left frontoparietal white matter as shown on the prior MRI. No evidence of advanced white matter changes. No sign of cortical or large vessel territory infarction. No mass, hemorrhage, hydrocephalus or extra-axial collection. Vascular: There is atherosclerotic calcification of the major vessels at the base of the brain. Skull: Negative Sinuses/Orbits: Clear/normal Other: None IMPRESSION: No acute CT finding. Age related volume loss, possibly frontal predominant. Old small vessel infarction in the left frontoparietal white matter. No CT evidence of advanced/widespread small-vessel disease. Electronically Signed   By: Nelson Chimes M.D.   On: 12/05/2021 13:35    PHYSICAL EXAM General: well-appearing and in no acute distress HEENT: normocephalic  and atraumatic Cardiovascular: regular rate Respiratory: normal respiratory effort and on RA Gastrointestinal: non-tender and non-distended Extremities: moving all  extremities spontaneously, with slight action tremor on bilateral hands  Mental Status: Justin Walters is alert. Speech was clear and fluent without evidence of aphasia. He was able to follow 3 step commands without difficulty.  Cranial Nerves: II:  Visual fields grossly normal; L pupil equal, round, reactive to light and accommodation. R pupil minimally reactive to light. R eye acuity impaired - blurred vision, decreased response to bright light, and impaired color recognition of bright color (red) III,IV, VI: no ptosis, extra-ocular motions intact bilaterally V,VII: smile symmetric, facial light touch sensation intact bilaterally IX,X: uvula rises symmetrically XI: shoulder symmetrically elevate bilaterally XII: midline tongue extension without atrophy and without fasciculations  Motor: Right : Upper extremity   5/5 full power  Lower extremity   5/5 full power  Left: Upper extremity   5/5 full power Lower extremity   5/5 full power  Tone and bulk: normal tone throughout; no atrophy noted  Sensory: sensation to light touch intact throughout bilaterally  Deep Tendon Reflexes:  Right: Upper Extremity   Left: Upper extremity   biceps (C-5 to C-6) 2/4   biceps (C-5 to C-6) 2/4 tricep (C7) 2/4    triceps (C7) 2/4 Brachioradialis (C6) 2/4  Brachioradialis (C6) 2/4  Lower Extremity Lower Extremity  quadriceps (L-2 to L-4) 2/4   quadriceps (L-2 to L-4) 2/4 Achilles (S1) 2/4   Achilles (S1) 2/4  Cerebellar: Finger-to-nose test normal, heel-to-shin test normal  Gait: not observed during encounter  ASSESSMENT/PLAN [name] is a 81 y.o. male with history of hypertension, hyperlipidemia, diabetes, CKD 3 and COPD presents with acute onset blurred vision in the right eye on 11/17/2021, seen by eye doctor and a retina specialist on 11/28 and provided diagnosis of CRAO with directions to come to hospital for stroke workup.  #Subacute right central retinal artery occlusion Code  Stroke CT head no acute abnormality. Old small vessel infarction in L frontoparietal white matter. No small vessel disease. Age-related volume loss. MRI no acute intracranial process. No evidence of acute or subacute infarct. MRA no intracranial large vessel occlusion or significant stenosis.  Carotid Doppler left ICA 40 to 59% stenosis.  Right ICA 1-39% stenosis.  2D Echo ejection fraction 55 to 60%. LDL 51 06/30/2021, fasting lab pending HgbA1c 6.1 06/30/2021, repeat pending VTE prophylaxis - enoxaparin    Diet   Diet Heart Room service appropriate? Yes; Fluid consistency: Thin   aspirin 81 mg daily prior to admission, now on aspirin 81 mg daily and clopidogrel 75 mg daily. Aspirin and clopidogrel for 21 days, then clopidogrel alone daily indefinitely Therapy recommendations: No PT follow-up  disposition: Home #Hypertension, chronic #BPH Home meds: tamsulosin Stable Permissive hypertension (OK if < 220/120) but gradually normalize in 5-7 days Long-term BP goal normotensive  #Hyperlipidemia Home meds: rosuvastatin 20 mg daily, resumed in hospital LDL 51 06/30/2021, fasting lab pending, goal < 70 Continue statin at discharge  #Diabetes type II Home meds:  dapagliflozin HgbA1c 6.1 06/30/2021, repeat pending, goal < 7.0 CBGs No results for input(s): "GLUCAP" in the last 72 hours.  SSI  #Cannabis use Recommended cessation  Other Active Problems #Hypocalcemia #CKD stage IIIb #COPD  Hospital day # 0  I have personally obtained history,examined this patient, reviewed notes, independently viewed imaging studies, participated in medical decision making and plan of care.ROS completed by me personally and pertinent positives fully documented  I have made any additions or clarifications directly to the above note. Agree with note above.  Patient presented with 2-1/2-week history of blurred vision in the right eye and central retinal artery occlusion without any strokelike symptoms.  MRI  scan is negative for acute stroke and carotid ultrasound shows only mild 40-59% left ICA stenosis.  Recommend aspirin and Plavix for 3 weeks followed by Plavix alone and aggressive risk factor modification.  Long discussion with the patient and answered questions.  Discussed with Dr. Avon Gully.  Greater than 50% time during this 50-minute visit was spent in counseling and coordination of care about his central retinal artery occlusion and discussion about stroke risk, evaluation and treatment and answering questions.  Antony Contras, MD Medical Director [facility] Pager: [phone] 12/06/2021 5:14 PM  To contact Stroke Continuity provider, please refer to [email or web address] After hours, contact General Neurology

## 2021-12-08 DIAGNOSIS — B353 Tinea pedis: Secondary | ICD-10-CM | POA: Diagnosis not present

## 2021-12-08 DIAGNOSIS — L858 Other specified epidermal thickening: Secondary | ICD-10-CM | POA: Diagnosis not present

## 2021-12-29 DIAGNOSIS — B353 Tinea pedis: Secondary | ICD-10-CM | POA: Diagnosis not present

## 2022-01-12 ENCOUNTER — Ambulatory Visit (INDEPENDENT_AMBULATORY_CARE_PROVIDER_SITE_OTHER): Payer: Medicare HMO | Admitting: Internal Medicine

## 2022-01-12 VITALS — BP 130/74 | HR 74 | Temp 97.7°F | Ht 67.0 in | Wt 166.0 lb

## 2022-01-12 DIAGNOSIS — E119 Type 2 diabetes mellitus without complications: Secondary | ICD-10-CM

## 2022-01-12 DIAGNOSIS — H349 Unspecified retinal vascular occlusion: Secondary | ICD-10-CM

## 2022-01-12 DIAGNOSIS — N1832 Chronic kidney disease, stage 3b: Secondary | ICD-10-CM

## 2022-01-12 DIAGNOSIS — I10 Essential (primary) hypertension: Secondary | ICD-10-CM | POA: Diagnosis not present

## 2022-01-12 DIAGNOSIS — E7800 Pure hypercholesterolemia, unspecified: Secondary | ICD-10-CM

## 2022-01-12 DIAGNOSIS — Z0000 Encounter for general adult medical examination without abnormal findings: Secondary | ICD-10-CM

## 2022-01-12 DIAGNOSIS — E538 Deficiency of other specified B group vitamins: Secondary | ICD-10-CM | POA: Diagnosis not present

## 2022-01-12 DIAGNOSIS — E559 Vitamin D deficiency, unspecified: Secondary | ICD-10-CM

## 2022-01-12 DIAGNOSIS — J449 Chronic obstructive pulmonary disease, unspecified: Secondary | ICD-10-CM

## 2022-01-12 DIAGNOSIS — I700 Atherosclerosis of aorta: Secondary | ICD-10-CM

## 2022-01-12 DIAGNOSIS — Z0001 Encounter for general adult medical examination with abnormal findings: Secondary | ICD-10-CM

## 2022-01-12 LAB — HEPATIC FUNCTION PANEL
ALT: 9 U/L (ref 0–53)
AST: 14 U/L (ref 0–37)
Albumin: 3.8 g/dL (ref 3.5–5.2)
Alkaline Phosphatase: 53 U/L (ref 39–117)
Bilirubin, Direct: 0.1 mg/dL (ref 0.0–0.3)
Total Bilirubin: 0.5 mg/dL (ref 0.2–1.2)
Total Protein: 7 g/dL (ref 6.0–8.3)

## 2022-01-12 LAB — CBC WITH DIFFERENTIAL/PLATELET
Basophils Absolute: 0.1 10*3/uL (ref 0.0–0.1)
Basophils Relative: 0.6 % (ref 0.0–3.0)
Eosinophils Absolute: 0.3 10*3/uL (ref 0.0–0.7)
Eosinophils Relative: 2.9 % (ref 0.0–5.0)
HCT: 41.3 % (ref 39.0–52.0)
Hemoglobin: 13.4 g/dL (ref 13.0–17.0)
Lymphocytes Relative: 14 % (ref 12.0–46.0)
Lymphs Abs: 1.2 10*3/uL (ref 0.7–4.0)
MCHC: 32.4 g/dL (ref 30.0–36.0)
MCV: 82.5 fl (ref 78.0–100.0)
Monocytes Absolute: 0.9 10*3/uL (ref 0.1–1.0)
Monocytes Relative: 10.1 % (ref 3.0–12.0)
Neutro Abs: 6.3 10*3/uL (ref 1.4–7.7)
Neutrophils Relative %: 72.4 % (ref 43.0–77.0)
Platelets: 198 10*3/uL (ref 150.0–400.0)
RBC: 5 Mil/uL (ref 4.22–5.81)
RDW: 17 % — ABNORMAL HIGH (ref 11.5–15.5)
WBC: 8.7 10*3/uL (ref 4.0–10.5)

## 2022-01-12 LAB — VITAMIN B12: Vitamin B-12: 863 pg/mL (ref 211–911)

## 2022-01-12 LAB — URINALYSIS, ROUTINE W REFLEX MICROSCOPIC
Bilirubin Urine: NEGATIVE
Hgb urine dipstick: NEGATIVE
Ketones, ur: NEGATIVE
Nitrite: NEGATIVE
Specific Gravity, Urine: 1.025 (ref 1.000–1.030)
Urine Glucose: >=1000 — AB
Urobilinogen, UA: 0.2 (ref 0.0–1.0)
pH: 6 (ref 5.0–8.0)

## 2022-01-12 LAB — BASIC METABOLIC PANEL
BUN: 23 mg/dL (ref 6–23)
CO2: 28 mEq/L (ref 19–32)
Calcium: 9.2 mg/dL (ref 8.4–10.5)
Chloride: 105 mEq/L (ref 96–112)
Creatinine, Ser: 1.41 mg/dL (ref 0.40–1.50)
GFR: 46.92 mL/min — ABNORMAL LOW (ref >60.00)
Glucose, Bld: 93 mg/dL (ref 70–99)
Potassium: 3.8 mEq/L (ref 3.5–5.1)
Sodium: 142 mEq/L (ref 135–145)

## 2022-01-12 LAB — HEMOGLOBIN A1C: Hgb A1c MFr Bld: 6.3 % (ref 4.6–6.5)

## 2022-01-12 LAB — LIPID PANEL
Cholesterol: 107 mg/dL (ref 0–200)
HDL: 41.2 mg/dL (ref >39.00)
LDL Cholesterol: 46 mg/dL (ref 0–99)
NonHDL: 65.34
Total CHOL/HDL Ratio: 3
Triglycerides: 97 mg/dL (ref 0.0–149.0)
VLDL: 19.4 mg/dL (ref 0.0–40.0)

## 2022-01-12 LAB — MICROALBUMIN / CREATININE URINE RATIO
Creatinine,U: 84.3 mg/dL
Microalb Creat Ratio: 5.9 mg/g (ref 0.0–30.0)
Microalb, Ur: 5 mg/dL — ABNORMAL HIGH (ref 0.0–1.9)

## 2022-01-12 LAB — TSH: TSH: 4.27 u[IU]/mL (ref 0.35–5.50)

## 2022-01-12 LAB — VITAMIN D 25 HYDROXY (VIT D DEFICIENCY, FRACTURES): VITD: 41.02 ng/mL (ref 30.00–100.00)

## 2022-01-12 MED ORDER — CLOPIDOGREL BISULFATE 75 MG PO TABS: 75.0000 mg | ORAL_TABLET | Freq: Every day | ORAL | 3 refills | Status: DC

## 2022-01-12 MED ORDER — CLOPIDOGREL BISULFATE 75 MG PO TABS: 75.0000 mg | ORAL_TABLET | Freq: Every day | ORAL | 0 refills | Status: DC

## 2022-01-12 NOTE — Assessment & Plan Note (Addendum)
With partial visual field loss to the right eye';cont plavix alone 75 mg long term, ok to d/c aspirin as per recent neuro evaluation

## 2022-01-12 NOTE — Patient Instructions (Signed)
The plavix was refilled today   There is no need to take further Aspirin  Please continue all other medications as before, and refills have been done if requested.  Please have the pharmacy call with any other refills you may need.  Please continue your efforts at being more active, low cholesterol diet, and weight control.  You are otherwise up to date with prevention measures today.  Please keep your appointments with your specialists as you may have planned  Please go to the LAB at the blood drawing area for the tests to be done  You will be contacted by phone if any changes need to be made immediately.  Otherwise, you will receive a letter about your results with an explanation, but please check with MyChart first.  Please remember to sign up for MyChart if you have not done so, as this will be important to you in the future with finding out test results, communicating by private email, and scheduling acute appointments online when needed.  Please make an Appointment to return in 6 months, or sooner if needed

## 2022-01-12 NOTE — Progress Notes (Signed)
Patient ID: SADIK PIASCIK, male   DOB: 07-19-1940, 82 y.o.   MRN: [id number]         Chief Complaint:: wellness exam and copd , aortic atheroscloeriss, ckd, dm, htn, low B12 and Vit D       HPI:  Justin Walters is a 82 y.o. male here for wellness exam; o/w up to date                        Also had lower BP so took some extra salt and more fluids since last wk, BP improved.  Needs plavix refill, ahs completed asa 81 mg with this for 21 days, then plavix alone.  Pt denies chest pain, increased sob or doe, wheezing, orthopnea, PND, increased LE swelling, palpitations, dizziness or syncope.   Pt denies polydipsia, polyuria, or new focal neuro s/s.    Pt denies fever, wt loss, night sweats, loss of appetite, or other constitutional symptoms   Tolerating trelegy well, improved over adviar.   Wt Readings from Last 3 Encounters:  01/12/22 166 lb (75.3 kg)  12/05/21 160 lb 15 oz (73 kg)  08/09/21 161 lb 12.8 oz (73.4 kg)   BP Readings from Last 3 Encounters:  01/12/22 130/74  12/06/21 131/79  08/09/21 122/64   Immunization History  Administered Date(s) Administered   Fluad Quad(high Dose 65+) 10/08/2020, 12/07/2021   Influenza Whole 11/30/2006, 10/03/2007, 12/11/2008, 10/13/2009   Influenza, High Dose Seasonal PF 12/30/2015, 11/15/2016, 10/01/2017   Influenza,inj,Quad PF,6+ Mos 11/17/2014   Influenza-Unspecified 10/24/2019   PFIZER(Purple Top)SARS-COV-2 Vaccination 03/09/2019, 04/08/2019, 10/24/2019   Pneumococcal Conjugate-13 05/01/2013   Pneumococcal Polysaccharide-23 10/03/2007   Td 10/03/2007   Tdap 07/30/2018   Zoster Recombinat (Shingrix) 07/25/2017, 12/03/2017   Zoster, Live 10/03/2007   There are no preventive care reminders to display for this patient.     Past Medical History:  Diagnosis Date   At risk for sleep apnea    STOP-BANG= 5    SENT TO PCP 06-13-2013   Bladder stone    BPH (benign prostatic hyperplasia)    COPD (chronic obstructive pulmonary disease) (HCC)     Diverticulosis of colon    Dysuria    Glucose intolerance (impaired glucose tolerance)    History of kidney stones    Hyperlipidemia    Hypertension    Ureteral disorder 2018   ureteral repair   Past Surgical History:  Procedure Laterality Date   APPENDECTOMY  1980's   CATARACT EXTRACTION W/ INTRAOCULAR LENS  IMPLANT, BILATERAL     COLONOSCOPY  10-05-2003   tics only    CYSTOSCOPY N/A 06/23/2013   Procedure: CYSTOSCOPY WITH LITHOLAPAXY, BLADDER EXPLORATION, REPAIR BLADDER PERFORATION;  Surgeon: Arvil Persons, MD;  Location: [place];  Service: Urology;  Laterality: N/A;   CYSTOSCOPY/URETEROSCOPY/HOLMIUM LASER/STENT PLACEMENT Left 04/03/2017   Procedure: CYSTOSCOPY LEFT RETROGRADE/LEFT /URETEROSCOPY/HOLMIUM LASER/BASKET STONE EXTRACTION/STENT PLACEMENT/ URETHRAL BIOPSY;  Surgeon: [name], MD;  Location: [facility];  Service: Urology;  Laterality: Left;   EXTRACORPOREAL SHOCK WAVE LITHOTRIPSY Left 10/23/2016   Procedure: LEFT EXTRACORPOREAL SHOCK WAVE LITHOTRIPSY (ESWL);  Surgeon: Nickie Retort, MD;  Location: [facility];  Service: Urology;  Laterality: Left;   EXTRACORPOREAL SHOCK WAVE LITHOTRIPSY Left 01/08/2017   Procedure: LEFT EXTRACORPOREAL SHOCK WAVE LITHOTRIPSY (ESWL);  Surgeon: [name], MD;  Location: [facility];  Service: Urology;  Laterality: Left;   HOLMIUM LASER APPLICATION N/A 1/44/3154   Procedure: HOLMIUM LASER APPLICATION;  Surgeon: Arvil Persons,  MD;  Location: [place];  Service: Urology;  Laterality: N/A;   LAPAROSCOPIC INGUINAL HERNIA REPAIR Bilateral 10-09-2003   TRANSURETHRAL RESECTION OF PROSTATE N/A 06/23/2013   Procedure: TRANSURETHRAL RESECTION OF THE PROSTATE;  Surgeon: Arvil Persons, MD;  Location: [facility];  Service: Urology;  Laterality: N/A;    reports that he quit smoking about 29 years ago. His smoking use included cigarettes. He has a 102.00 pack-year smoking history. He has never used smokeless  tobacco. He reports that he does not drink alcohol and does not use drugs. family history includes COPD in his mother; Cancer in his father; Emphysema in his brother; Heart failure in his brother. No Known Allergies Current Outpatient Medications on File Prior to Visit  Medication Sig Dispense Refill   acetaminophen (TYLENOL) 325 MG tablet Take 650 mg by mouth every 6 (six) hours as needed for mild pain or headache.     albuterol (PROVENTIL HFA;VENTOLIN HFA) 108 (90 Base) MCG/ACT inhaler INHALE 2 PUFFS INTO THE LUNGS EVERY 6 (SIX) HOURS AS NEEDED FOR WHEEZING OR SHORTNESS OF BREATH. 54 g 3   aspirin 81 MG EC tablet Take 1 tablet (81 mg total) by mouth daily. Swallow whole. (Patient taking differently: Take 81 mg by mouth every evening. Swallow whole.) 30 tablet 12   cholecalciferol (VITAMIN D3) 25 MCG (1000 UNIT) tablet Take 2,000 Units by mouth daily.     dapagliflozin propanediol (FARXIGA) 5 MG TABS tablet Take 1 tablet (5 mg total) by mouth daily before breakfast. 90 tablet 3   famotidine (PEPCID) 20 MG tablet Take 20 mg by mouth daily.     Fluticasone-Umeclidin-Vilant (TRELEGY ELLIPTA) 100-62.5-25 MCG/ACT AEPB Inhale 1 puff into the lungs daily.     gabapentin (NEURONTIN) 300 MG capsule TAKE 1 CAPSULE THREE TIMES DAILY (Patient taking differently: Take by mouth See admin instructions. Take one capsule (300 mg) in the morning and two capsules (600 mg) in the evening.) 270 capsule 1   Guaifenesin 1200 MG TB12 Take 1 tablet by mouth 2 (two) times daily as needed (congestion).     MELATONIN PO Take 1 tablet by mouth at bedtime.     pantoprazole (PROTONIX) 40 MG tablet TAKE 1 TABLET ONCE DAILY 30 TO 60 MINUTES BEFORE  FIRST MEAL OF THE DAY (Patient taking differently: See admin instructions. Take one tablet (40 mg) by mouth once daily 30 to 60 minutes before first meal of the day) 90 tablet 1   predniSONE (DELTASONE) 10 MG tablet Take by mouth See admin instructions. TAKE 2 TABLETS BY MOUTH UNTIL  BETTER, THEN TAKE 1 TABLET BY MOUTH ONCE DAILY FOR 5 DAYS AND STOP     Respiratory Therapy Supplies (FLUTTER) DEVI Use as directed 1 each 0   rosuvastatin (CRESTOR) 20 MG tablet TAKE 1 TABLET EVERY DAY (Patient taking differently: Take 20 mg by mouth every evening.) 90 tablet 2   tamsulosin (FLOMAX) 0.4 MG CAPS capsule Take 0.4 mg by mouth every evening.     vitamin B-12 (CYANOCOBALAMIN) 1000 MCG tablet Take 1 tablet (1,000 mcg total) by mouth daily. 90 tablet 1   No current facility-administered medications on file prior to visit.        ROS:  All others reviewed and negative.  Objective        PE:  BP 130/74 (BP Location: Left Arm, Patient Position: Sitting, Cuff Size: Large)   Pulse 74   Temp 97.7 F (36.5 C) (Oral)   Ht '5\' 7"'$  (1.702  m)   Wt 166 lb (75.3 kg)   SpO2 94%   BMI 26.00 kg/m                 Constitutional: Pt appears in NAD               HENT: Head: NCAT.                Right Ear: External ear normal.                 Left Ear: External ear normal.                Eyes: . Pupils are equal, round, and reactive to light. Conjunctivae and EOM are normal               Nose: without d/c or deformity               Neck: Neck supple. Gross normal ROM               Cardiovascular: Normal rate and regular rhythm.                 Pulmonary/Chest: Effort normal and breath sounds without rales or wheezing.                Abd:  Soft, NT, ND, + BS, no organomegaly               Neurological: Pt is alert. At baseline orientation, motor grossly intact               Skin: Skin is warm. No rashes, no other new lesions, LE edema - none               Psychiatric: Pt behavior is normal without agitation   Micro: none  Cardiac tracings I have personally interpreted today:  none  Pertinent Radiological findings (summarize): none   Lab Results  Component Value Date   WBC 8.7 01/12/2022   HGB 13.4 01/12/2022   HCT 41.3 01/12/2022   PLT 198.0 01/12/2022   GLUCOSE 93 01/12/2022    CHOL 107 01/12/2022   TRIG 97.0 01/12/2022   HDL 41.20 01/12/2022   LDLDIRECT 161.9 03/28/2012   LDLCALC 46 01/12/2022   ALT 9 01/12/2022   AST 14 01/12/2022   NA 142 01/12/2022   K 3.8 01/12/2022   CL 105 01/12/2022   CREATININE 1.41 01/12/2022   BUN 23 01/12/2022   CO2 28 01/12/2022   TSH 4.27 01/12/2022   PSA 0.67 01/04/2021   INR 1.1 12/05/2021   HGBA1C 6.3 01/12/2022   MICROALBUR 5.0 (H) 01/12/2022   Assessment/Plan:  SHAWNEE HIGHAM is a 82 y.o. White or Caucasian [1] male with  has a past medical history of At risk for sleep apnea, Bladder stone, BPH (benign prostatic hyperplasia), COPD (chronic obstructive pulmonary disease) ([place]), Diverticulosis of colon, Dysuria, Glucose intolerance (impaired glucose tolerance), History of kidney stones, Hyperlipidemia, Hypertension, and Ureteral disorder (2018).  Retinal artery occlusion With partial visual field loss to the right eye';cont plavix alone 75 mg long term, ok to d/c aspirin as per recent neuro evaluation  COPD GOLD III Improved symptomatically - for cont trelegy as rx  Encounter for well adult exam with abnormal findings Age and sex appropriate education and counseling updated with regular exercise and diet Referrals for preventative services - none needed Immunizations addressed - none needed Smoking counseling  - none needed Evidence for depression or other mood disorder -  none significant Most recent labs reviewed. I have personally reviewed and have noted: 1) the patient's medical and social history 2) The patient's current medications and supplements 3) The patient's height, weight, and BMI have been recorded in the chart   Aortic atherosclerosis (HCC) Pt to continue plavix, crestor 20 mg qd, low chol diet, exercise,  CKD (chronic kidney disease) stage 3, GFR 30-59 ml/min (HCC) Lab Results  Component Value Date   CREATININE 1.41 01/12/2022   Stable overall, cont to avoid nephrotoxins   Controlled type 2  diabetes mellitus without complication, without long-term current use of insulin ([place]) Lab Results  Component Value Date   HGBA1C 6.3 01/12/2022   Stable, pt to continue current medical treatment  - diet, wt control, excercise   Essential hypertension BP Readings from Last 3 Encounters:  01/12/22 130/74  12/06/21 131/79  08/09/21 122/64   Stable, pt to continue without BP med for now, was low last wk now improved per pt, continue hydration   Hyperlipidemia Lab Results  Component Value Date   LDLCALC 46 01/12/2022   Stable, pt to continue current statin crestor 20 mg qd   Vitamin B12 deficiency Lab Results  Component Value Date   VITAMINB12 863 01/12/2022   Stable, cont oral replacement - b12 1000 mcg qd   Vitamin D deficiency Last vitamin D Lab Results  Component Value Date   VD25OH 41.02 01/12/2022   Stable, cont oral replacement  Followup: Return in about 6 months (around 07/13/2022).  Cathlean Cower, MD 01/15/2022 7:37 AM [facility]

## 2022-01-15 ENCOUNTER — Encounter: Payer: Self-pay | Admitting: Internal Medicine

## 2022-01-15 NOTE — Assessment & Plan Note (Signed)
Last vitamin D Lab Results  Component Value Date   VD25OH 41.02 01/12/2022   Stable, cont oral replacement

## 2022-01-15 NOTE — Assessment & Plan Note (Signed)
Lab Results  Component Value Date   CREATININE 1.41 01/12/2022   Stable overall, cont to avoid nephrotoxins

## 2022-01-15 NOTE — Assessment & Plan Note (Signed)
Improved symptomatically - for cont trelegy as rx

## 2022-01-15 NOTE — Assessment & Plan Note (Signed)

## 2022-01-15 NOTE — Assessment & Plan Note (Signed)
Lab Results  Component Value Date   APOLIDCV01 314 01/12/2022   Stable, cont oral replacement - b12 1000 mcg qd

## 2022-01-15 NOTE — Assessment & Plan Note (Signed)
Lab Results  Component Value Date   HGBA1C 6.3 01/12/2022   Stable, pt to continue current medical treatment  - diet, wt control, excercise

## 2022-01-15 NOTE — Assessment & Plan Note (Signed)
Pt to continue plavix, crestor 20 mg qd, low chol diet, exercise,

## 2022-01-15 NOTE — Assessment & Plan Note (Signed)
Lab Results  Component Value Date   LDLCALC 46 01/12/2022   Stable, pt to continue current statin crestor 20 mg qd

## 2022-01-15 NOTE — Assessment & Plan Note (Signed)
BP Readings from Last 3 Encounters:  01/12/22 130/74  12/06/21 131/79  08/09/21 122/64   Stable, pt to continue without BP med for now, was low last wk now improved per pt, continue hydration

## 2022-01-22 ENCOUNTER — Other Ambulatory Visit: Payer: Self-pay | Admitting: Internal Medicine

## 2022-01-22 DIAGNOSIS — R918 Other nonspecific abnormal finding of lung field: Secondary | ICD-10-CM

## 2022-01-25 ENCOUNTER — Ambulatory Visit: Payer: Medicare HMO | Admitting: Neurology

## 2022-01-25 ENCOUNTER — Encounter: Payer: Self-pay | Admitting: Neurology

## 2022-01-25 VITALS — BP 142/85 | HR 79 | Ht 67.0 in | Wt 166.0 lb

## 2022-01-25 DIAGNOSIS — H3411 Central retinal artery occlusion, right eye: Secondary | ICD-10-CM

## 2022-01-25 DIAGNOSIS — I6522 Occlusion and stenosis of left carotid artery: Secondary | ICD-10-CM

## 2022-01-25 NOTE — Patient Instructions (Addendum)
I had a long d/w patient about his recent vision loss in the right eye due to central retinal artery occlusion and , risk for recurrent stroke/TIAs, personally independently reviewed imaging studies and stroke evaluation results and answered questions.Continue Plavix 75 mg daily alone and discontinue aspirin now for secondary stroke prevention and maintain strict control of hypertension with blood pressure goal below 130/90, diabetes with hemoglobin A1c goal below 6.5% and lipids with LDL cholesterol goal below 70 mg/dL. I also advised the patient to eat a healthy diet with plenty of whole grains, cereals, fruits and vegetables, exercise regularly and maintain ideal body weight Followup in the future with my nurse practitioner in 6 months or call earlier if necessary.  Stroke Prevention Some medical conditions and behaviors can lead to a higher chance of having a stroke. You can help prevent a stroke by eating healthy, exercising, not smoking, and managing any medical conditions you have. Stroke is a leading cause of functional impairment. Primary prevention is particularly important because a majority of strokes are first-time events. Stroke changes the lives of not only those who experience a stroke but also their family and other caregivers. How can this condition affect me? A stroke is a medical emergency and should be treated right away. A stroke can lead to brain damage and can sometimes be life-threatening. If a person gets medical treatment right away, there is a better chance of surviving and recovering from a stroke. What can increase my risk? The following medical conditions may increase your risk of a stroke: Cardiovascular disease. High blood pressure (hypertension). Diabetes. High cholesterol. Sickle cell disease. Blood clotting disorders (hypercoagulable state). Obesity. Sleep disorders (obstructive sleep apnea). Other risk factors include: Being older than age 67. Having a history  of blood clots, stroke, or mini-stroke (transient ischemic attack, TIA). Genetic factors, such as race, ethnicity, or a family history of stroke. Smoking cigarettes or using other tobacco products. Taking birth control pills, especially if you also use tobacco. Heavy use of alcohol or drugs, especially cocaine and methamphetamine. Physical inactivity. What actions can I take to prevent this? Manage your health conditions High cholesterol levels. Eating a healthy diet is important for preventing high cholesterol. If cholesterol cannot be managed through diet alone, you may need to take medicines. Take any prescribed medicines to control your cholesterol as told by your health care provider. Hypertension. To reduce your risk of stroke, try to keep your blood pressure below 130/80. Eating a healthy diet and exercising regularly are important for controlling blood pressure. If these steps are not enough to manage your blood pressure, you may need to take medicines. Take any prescribed medicines to control hypertension as told by your health care provider. Ask your health care provider if you should monitor your blood pressure at home. Have your blood pressure checked every year, even if your blood pressure is normal. Blood pressure increases with age and some medical conditions. Diabetes. Eating a healthy diet and exercising regularly are important parts of managing your blood sugar (glucose). If your blood sugar cannot be managed through diet and exercise, you may need to take medicines. Take any prescribed medicines to control your diabetes as told by your health care provider. Get evaluated for obstructive sleep apnea. Talk to your health care provider about getting a sleep evaluation if you snore a lot or have excessive sleepiness. Make sure that any other medical conditions you have, such as atrial fibrillation or atherosclerosis, are managed. Nutrition Follow instructions from your health  care provider about what to eat or drink to help manage your health condition. These instructions may include: Reducing your daily calorie intake. Limiting how much salt (sodium) you use to 1,500 milligrams (mg) each day. Using only healthy fats for cooking, such as olive oil, canola oil, or sunflower oil. Eating healthy foods. You can do this by: Choosing foods that are high in fiber, such as whole grains, and fresh fruits and vegetables. Eating at least 5 servings of fruits and vegetables a day. Try to fill one-half of your plate with fruits and vegetables at each meal. Choosing lean protein foods, such as lean cuts of meat, poultry without skin, fish, tofu, beans, and nuts. Eating low-fat dairy products. Avoiding foods that are high in sodium. This can help lower blood pressure. Avoiding foods that have saturated fat, trans fat, and cholesterol. This can help prevent high cholesterol. Avoiding processed and prepared foods. Counting your daily carbohydrate intake.  Lifestyle If you drink alcohol: Limit how much you have to: 0-1 drink a day for women who are not pregnant. 0-2 drinks a day for men. Know how much alcohol is in your drink. In the [place] one drink equals one 12 oz bottle of beer (336m), one 5 oz glass of wine (1437m, or one 1 oz glass of hard liquor (4437m Do not use any products that contain nicotine or tobacco. These products include cigarettes, chewing tobacco, and vaping devices, such as e-cigarettes. If you need help quitting, ask your health care provider. Avoid secondhand smoke. Do not use drugs. Activity  Try to stay at a healthy weight. Get at least 30 minutes of exercise on most days, such as: Fast walking. Biking. Swimming. Medicines Take over-the-counter and prescription medicines only as told by your health care provider. Aspirin or blood thinners (antiplatelets or anticoagulants) may be recommended to reduce your risk of forming blood clots that can lead  to stroke. Avoid taking birth control pills. Talk to your health care provider about the risks of taking birth control pills if: You are over 35 44ars old. You smoke. You get very bad headaches. You have had a blood clot. Where to find more information American Stroke Association: www.strokeassociation.org Get help right away if: You or a loved one has any symptoms of a stroke. "BE FAST" is an easy way to remember the main warning signs of a stroke: B - Balance. Signs are dizziness, sudden trouble walking, or loss of balance. E - Eyes. Signs are trouble seeing or a sudden change in vision. F - Face. Signs are sudden weakness or numbness of the face, or the face or eyelid drooping on one side. A - Arms. Signs are weakness or numbness in an arm. This happens suddenly and usually on one side of the body. S - Speech. Signs are sudden trouble speaking, slurred speech, or trouble understanding what people say. T - Time. Time to call emergency services. Write down what time symptoms started. You or a loved one has other signs of a stroke, such as: A sudden, severe headache with no known cause. Nausea or vomiting. Seizure. These symptoms may represent a serious problem that is an emergency. Do not wait to see if the symptoms will go away. Get medical help right away. Call your local emergency services (911 in the [place] Do not drive yourself to the hospital. Summary You can help to prevent a stroke by eating healthy, exercising, not smoking, limiting alcohol intake, and managing any medical conditions you may have. Do  not use any products that contain nicotine or tobacco. These include cigarettes, chewing tobacco, and vaping devices, such as e-cigarettes. If you need help quitting, ask your health care provider. Remember "BE FAST" for warning signs of a stroke. Get help right away if you or a loved one has any of these signs. This information is not intended to replace advice given to you by your  health care provider. Make sure you discuss any questions you have with your health care provider. Document Revised: 07/21/2019 Document Reviewed: 07/21/2019 Elsevier Patient Education  [place].

## 2022-01-25 NOTE — Progress Notes (Signed)
[facility] [address] [place]. [place] [address] [phone]       OFFICE FOLLOW-UP NOTE  [name] Date of Birth:  [birth year] Medical Record Number:  [id number]   HPI: [name] Pain is a 82 year old Caucasian male seen today for initial office follow-up visit following hospital consultation for right eye vision loss.  History is obtained from the patient and review of electronic medical records.  I have personally reviewed pertinent available imaging films in PACS.MANAN OLMO is a 82 y.o. male with history of COPD, diabetes, hypertension, hyperlipidemia, CKD stage III and BPH who presented on 12/05/2021 after sudden onset blurring of vision in his right eye occurred on 11/17/21.  Patient was then seen by his eye doctor who referred him to a retina specialist whom he saw on 11/29/21.  He was then diagnosed with CRAO and was advised to come to the hospital for stroke work-up.  However, the hospital was very busy so patient delayed this until today.  Patient denies unilateral weakness, sensory deficit or other symptoms.  MRI scan of the brain shows no acute abnormality.  MR angiogram of the brain shows no large vessel intracranial stenosis.  Carotid ultrasound shows no significant stenosis of the right carotid but 40-59% left ICA stenosis.  LDL cholesterol 16/29/23 was 51 mg percent and repeat on 01/12/2022 was 46.  Hemoglobin A1c 07/01/2022 is 6.3.  Echocardiogram showed ejection fraction of 55 to 60% without cardiac source of embolism.  Patient was discharged on aspirin and Plavix for 3 weeks to be followed by Plavix alone.  He states he is doing well with his right eye vision remains unchanged.  Can still see blood as if he is seeing through a film of water in most of the right eye and count fingers in daily few feet but cannot read at the distance.  There is a small area in the medial inferior fields of the right eye where cannot see anything abnormal.  He remains on aspirin  and Plavix which is tolerating well without bruising or bleeding.  Blood pressure needs a good control.  He is tolerating Crestor well without muscle aches and pains.  Recent lab work 2 weeks ago for lipids and A1c was quite satisfactory.  He has no new complaints.  He denies any prior GI stroke TIA or neurological problems.  ROS:   14 system review of systems is positive for vision loss, blurred vision all other systems negative  PMH:  Past Medical History:  Diagnosis Date   At risk for sleep apnea    STOP-BANG= 5    SENT TO PCP 06-13-2013   Bladder stone    BPH (benign prostatic hyperplasia)    COPD (chronic obstructive pulmonary disease) (HCC)    Diverticulosis of colon    Dysuria    Glucose intolerance (impaired glucose tolerance)    History of kidney stones    Hyperlipidemia    Hypertension    Ureteral disorder 2018   ureteral repair    Social History:  Social History   Socioeconomic History   Marital status: Widowed    Spouse name: Not on file   Number of children: 3   Years of education: Not on file   Highest education level: GED or equivalent  Occupational History   Occupation: Retired Animal nutritionist Delivery)  Tobacco Use   Smoking status: Former    Packs/day: 3.00    Years: 34.00    Total pack years: 102.00  Types: Cigarettes    Quit date: 01/02/1993    Years since quitting: 29.0   Smokeless tobacco: Never  Vaping Use   Vaping Use: Never used  Substance and Sexual Activity   Alcohol use: No    Alcohol/week: 4.0 standard drinks of alcohol    Types: 4 Standard drinks or equivalent per week   Drug use: No   Sexual activity: Not Currently    Partners: Female  Other Topics Concern   Not on file  Social History Narrative   Widowed; lives in [place] home with daughter and Curator.   Feels safe in home.  Wears seatbelt.   No current Advanced Directives.   Independent with ADLs.   No physical activity; does a lot of yard work.   Social  Determinants of Health   Financial Resource Strain: Low Risk  (10/13/2021)   Overall Financial Resource Strain (CARDIA)    Difficulty of Paying Living Expenses: Not hard at all  Food Insecurity: No Food Insecurity (10/13/2021)   Hunger Vital Sign    Worried About Running Out of Food in the Last Year: Never true    Ran Out of Food in the Last Year: Never true  Transportation Needs: No Transportation Needs (10/13/2021)   PRAPARE - Hydrologist (Medical): No    Lack of Transportation (Non-Medical): No  Physical Activity: Inactive (10/13/2021)   Exercise Vital Sign    Days of Exercise per Week: 0 days    Minutes of Exercise per Session: 0 min  Stress: No Stress Concern Present (10/13/2021)   [place]    Feeling of Stress : Not at all  Social Connections: Moderately Integrated (10/13/2021)   Social Connection and Isolation Panel [NHANES]    Frequency of Communication with Friends and Family: More than three times a week    Frequency of Social Gatherings with Friends and Family: More than three times a week    Attends Religious Services: More than 4 times per year    Active Member of [employer] or Organizations: Yes    Attends Archivist Meetings: More than 4 times per year    Marital Status: Widowed  Intimate Partner Violence: Not At Risk (10/13/2021)   Humiliation, Afraid, Rape, and Kick questionnaire    Fear of Current or Ex-Partner: No    Emotionally Abused: No    Physically Abused: No    Sexually Abused: No    Medications:   Current Outpatient Medications on File Prior to Visit  Medication Sig Dispense Refill   acetaminophen (TYLENOL) 325 MG tablet Take 650 mg by mouth every 6 (six) hours as needed for mild pain or headache.     albuterol (PROVENTIL HFA;VENTOLIN HFA) 108 (90 Base) MCG/ACT inhaler INHALE 2 PUFFS INTO THE LUNGS EVERY 6 (SIX) HOURS AS NEEDED FOR WHEEZING OR  SHORTNESS OF BREATH. 54 g 3   cholecalciferol (VITAMIN D3) 25 MCG (1000 UNIT) tablet Take 2,000 Units by mouth daily.     clopidogrel (PLAVIX) 75 MG tablet Take 1 tablet (75 mg total) by mouth daily. 100 tablet 3   dapagliflozin propanediol (FARXIGA) 5 MG TABS tablet Take 1 tablet (5 mg total) by mouth daily before breakfast. 90 tablet 3   famotidine (PEPCID) 20 MG tablet Take 20 mg by mouth daily.     Fluticasone-Umeclidin-Vilant (TRELEGY ELLIPTA) 100-62.5-25 MCG/ACT AEPB Inhale 1 puff into the lungs daily.     gabapentin (NEURONTIN) 300 MG capsule TAKE  1 CAPSULE THREE TIMES DAILY (Patient taking differently: Take by mouth See admin instructions. Take one capsule (300 mg) in the morning and two capsules (600 mg) in the evening.) 270 capsule 1   Guaifenesin 1200 MG TB12 Take 1 tablet by mouth 2 (two) times daily as needed (congestion).     MELATONIN PO Take 1 tablet by mouth at bedtime.     pantoprazole (PROTONIX) 40 MG tablet TAKE 1 TABLET ONCE DAILY 30 TO 60 MINUTES BEFORE FIRST MEAL OF THE DAY 90 tablet 0   predniSONE (DELTASONE) 10 MG tablet Take by mouth See admin instructions. TAKE 2 TABLETS BY MOUTH UNTIL BETTER, THEN TAKE 1 TABLET BY MOUTH ONCE DAILY FOR 5 DAYS AND STOP     Respiratory Therapy Supplies (FLUTTER) DEVI Use as directed 1 each 0   rosuvastatin (CRESTOR) 20 MG tablet TAKE 1 TABLET EVERY DAY (Patient taking differently: Take 20 mg by mouth every evening.) 90 tablet 2   tamsulosin (FLOMAX) 0.4 MG CAPS capsule Take 0.4 mg by mouth every evening.     vitamin B-12 (CYANOCOBALAMIN) 1000 MCG tablet Take 1 tablet (1,000 mcg total) by mouth daily. 90 tablet 1   No current facility-administered medications on file prior to visit.    Allergies:  No Known Allergies  Physical Exam General: well developed, well nourished pleasant elderly Caucasian male, seated, in no evident distress Head: head normocephalic and atraumatic.  Neck: supple with no carotid or supraclavicular  bruits Cardiovascular: regular rate and rhythm, no murmurs Musculoskeletal: no deformity Skin:  no rash/petichiae Vascular:  Normal pulses all extremities Vitals:   01/25/22 0848  BP: (!) 142/85  Pulse: 79   Neurologic Exam Mental Status: Awake and fully alert. Oriented to place and time. Recent and remote memory intact. Attention span, concentration and fund of knowledge appropriate. Mood and affect appropriate.  Cranial Nerves: Fundoscopic exam reveals optic disc pallor on the right..  Right pupil is sluggishly reactive and left briskly reactive to light.  Vision acuity is significantly diminished in the right eye to finger counting at 2 feet with a blind spot nasal quadrant.  Extraocular movements full without nystagmus. Visual fields full to confrontation. Hearing intact. Facial sensation intact. Face, tongue, palate moves normally and symmetrically.  Motor: Normal bulk and tone. Normal strength in all tested extremity muscles. Sensory.: intact to touch ,pinprick .position and vibratory sensation.  Coordination: Rapid alternating movements normal in all extremities. Finger-to-nose and heel-to-shin performed accurately bilaterally. Gait and Station: Arises from chair without difficulty. Stance is normal. Gait demonstrates normal stride length and balance . Able to heel, toe and tandem walk with only slight difficulty.  Reflexes: 1+ and symmetric. Toes downgoing.   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  Vascular risk factors of hyperlipidemia mild carotid stenosis     PLAN:I had a long d/w patient about his recent vision loss in the right eye due to central retinal artery occlusion and , risk for recurrent stroke/TIAs, personally independently reviewed imaging studies and stroke evaluation results and answered questions.Continue Plavix 75 mg daily alone and  discontinue aspirin now for secondary stroke prevention and maintain strict control of hypertension with blood pressure goal below 130/90, diabetes with hemoglobin A1c goal below 6.5% and lipids with LDL cholesterol goal below 70 mg/dL. I also advised the patient to eat a healthy diet with plenty of whole grains, cereals, fruits  and vegetables, exercise regularly and maintain ideal body weight Followup in the future with my nurse practitioner in 6 months or call earlier if necessary..Recommended 6 monthly carotid ultrasound follow-up for left carotid surveillance.  Greater than 50% of time during this 35 minute visit was spent on counseling,explanation of diagnosis, planning of further management, discussion with patient and family and coordination of care Antony Contras, MD Note: This document was prepared with digital dictation and possible smart phrase technology. Any transcriptional errors that result from this process are unintentional

## 2022-02-07 DIAGNOSIS — B353 Tinea pedis: Secondary | ICD-10-CM | POA: Diagnosis not present

## 2022-02-08 NOTE — Progress Notes (Deleted)
Subjective:    Patient ID: Justin Walters, male   DOB: 04/20/1940,     MRN: [id number]    Brief patient profile:  37   yowm MM quit smoking 1995 with cough/wheeze/ sob and never recovered with documented GOLD III copd 02/23/2017      History of Present Illness  01/25/2017 1st  office visit/ Justin Walters   GOLD III with reversibility  Chief Complaint  Patient presents with   Pulmonary Consult    Referred by the hospital. He c/o SOB and coughing for the past several wks. His cough is occ prod, but he is unsure of sputum color. He is SOB with exertion and also when he lies down.  He gets winded walking approx 100 yards. He is using his albuterol inhaler 3 x per wk on average.   baseline doe across the street to son's house  nl pace and then onset   x one year gradually worse doe and no better on inhalers but poor baseline hfa/ assoc with cough / sense of chest congestion and has to prop up at 30 degrees now at hs to get comfortable Present doe = MMRC3 = can't walk 100 yards even at a slow pace at a flat grade s stopping due to sob  Easily confused with details of care/ names of meds / not using spiriva/ was better on pred and worse since tapered rec Plan A = Automatic =  symbicort 160 Take 2 puffs first thing in am and then another 2 puffs about 12 hours later.  Work on inhaler technique:  Plan B = Backup Only use your albuterol  as a rescue  Prednisone 10 mg take  4 each am x 2 days,   2 each am x 2 days,  1 each am x 2 days and stop     02/09/2021  f/u ov/Justin Walters re: GOLD  3  maint on trelelgy   Chief Complaint  Patient presents with   Follow-up    Sob-slightly better,occass. Cough-milky white, wheezing  Dyspnea:  still walking to son's house, yardwork  Cough: minimal in am / mucoid   Sleeping: 6 in bed blocks/ no resp cc  SABA use: rarely  02: none  Covid status:  vax x 3  no bivalent  Rec No change in medications    08/09/2021  f/u ov/Justin Walters re: GOLD 3 copd   maint on Advair 250   Chief  Complaint  Patient presents with   Follow-up    No new issues since LOV.   Dyspnea:  no problem fast pace to son's house  Cough: none  Sleeping: bed blocks/ one pillow no resp cc  SABA use: none / and no recent prednisone / no need for neb  Rec No change in medications See your eye doctor about your eye symptoms     02/09/2022 6 m  f/u ov/Justin Walters re: GOLD 3 copd   maint on ***  No chief complaint on file.   Dyspnea:  *** Cough: *** Sleeping: *** SABA use: *** 02: *** Covid status:   *** Lung cancer screening :  ***    No obvious day to day or daytime variability or assoc excess/ purulent sputum or mucus plugs or hemoptysis or cp or chest tightness, subjective wheeze or overt sinus or hb symptoms.   *** without nocturnal  or early am exacerbation  of respiratory  c/o's or need for noct saba. Also denies any obvious fluctuation of symptoms with weather or environmental changes  or other aggravating or alleviating factors except as outlined above   No unusual exposure hx or h/o childhood pna/ asthma or knowledge of premature birth.  Current Allergies, Complete Past Medical History, Past Surgical History, Family History, and Social History were reviewed in [facility] record.  ROS  The following are not active complaints unless bolded Hoarseness, sore throat, dysphagia, dental problems, itching, sneezing,  nasal congestion or discharge of excess mucus or purulent secretions, ear ache,   fever, chills, sweats, unintended wt loss or wt gain, classically pleuritic or exertional cp,  orthopnea pnd or arm/hand swelling  or leg swelling, presyncope, palpitations, abdominal pain, anorexia, nausea, vomiting, diarrhea  or change in bowel habits or change in bladder habits, change in stools or change in urine, dysuria, hematuria,  rash, arthralgias, visual complaints, headache, numbness, weakness or ataxia or problems with walking or coordination,  change in mood or  memory.         No outpatient medications have been marked as taking for the 02/09/22 encounter (Appointment) with Tanda Rockers, MD.                           Objective:   Physical Exam   Wts  02/09/2022           ***  08/09/2021          161  02/09/2021          169 07/08/2020          170  01/05/2020         179 09/30/2019        176  06/11/2019          180  03/10/2019          178 09/10/2018          174  06/06/2018          171  03/25/2018       155  03/08/2018         149   01/25/2018       156  12/05/2017       157  11/21/2017     157  05/24/2017        164    01/25/17 150 lb 12.8 oz (68.4 kg)  01/18/17 150 lb (68 kg)  01/08/17 150 lb (68 kg)      Vital signs reviewed  02/09/2022  - Note at rest 02 sats  ***% on ***   General appearance:    ***   Mod bar***               Assessment:

## 2022-02-09 ENCOUNTER — Ambulatory Visit: Payer: Medicare HMO | Admitting: Internal Medicine

## 2022-02-21 NOTE — Progress Notes (Unsigned)
Subjective:    Patient ID: Justin Walters, male   DOB: 1940-01-04,     MRN: [id number]    Brief patient profile:  32  yowm MM quit smoking 1995 with cough/wheeze/ sob and never recovered with documented GOLD III copd 02/23/2017      History of Present Illness  01/25/2017 1st  office visit/ Justin Walters   GOLD III with reversibility  Chief Complaint  Patient presents with   Pulmonary Consult    Referred by the hospital. He c/o SOB and coughing for the past several wks. His cough is occ prod, but he is unsure of sputum color. He is SOB with exertion and also when he lies down.  He gets winded walking approx 100 yards. He is using his albuterol inhaler 3 x per wk on average.   baseline doe across the street to son's house  nl pace and then onset   x one year gradually worse doe and no better on inhalers but poor baseline hfa/ assoc with cough / sense of chest congestion and has to prop up at 30 degrees now at hs to get comfortable Present doe = MMRC3 = can't walk 100 yards even at a slow pace at a flat grade s stopping due to sob  Easily confused with details of care/ names of meds / not using spiriva/ was better on pred and worse since tapered rec Plan A = Automatic =  symbicort 160 Take 2 puffs first thing in am and then another 2 puffs about 12 hours later.  Work on inhaler technique:  Plan B = Backup Only use your albuterol  as a rescue  Prednisone 10 mg take  4 each am x 2 days,   2 each am x 2 days,  1 each am x 2 days and stop        08/09/2021  f/u ov/Justin Walters re: GOLD 3 copd   maint on Advair 250   Chief Complaint  Patient presents with   Follow-up    No new issues since LOV.   Dyspnea:  no problem fast pace to son's house x 200 ft  Cough: none  Sleeping: bed blocks/ one pillow no resp cc  SABA use: none / and no recent prednisone / no need for neb  Rec No change in medications See your eye doctor about your eye symptoms     02/22/2022 6 m  f/u ov/Justin Walters re: GOLD 3 copd   maint on Trelegy  100  no recent prednisone needed  Chief Complaint  Patient presents with   Follow-up    Doing well until 1 week ago,  more phlem with slight cough.  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  Dyspnea:  walks to son's house daily, may be slowing down a bit  Cough: min rattling / does not wake up with it Sleeping: bed blocks and one pillow and no rsp cc  SABA use: not needing  02: none  Covid status:   vax x 2 , infected one      No obvious day to day or daytime variability or assoc excess/ purulent sputum or mucus plugs or hemoptysis or cp or chest tightness, subjective wheeze or overt sinus or hb symptoms.   Sleeping ok  without nocturnal  or early am exacerbation  of respiratory  c/o's or need for noct saba. Also denies any obvious fluctuation of symptoms with weather or environmental changes or other aggravating or alleviating factors except as outlined above   No unusual exposure  hx or h/o childhood pna/ asthma or knowledge of premature birth.  Current Allergies, Complete Past Medical History, Past Surgical History, Family History, and Social History were reviewed in [facility] record.  ROS  The following are not active complaints unless bolded Hoarseness, sore throat, dysphagia, dental problems, itching, sneezing,  nasal congestion or discharge of excess mucus or purulent secretions, ear ache,   fever, chills, sweats, unintended wt loss or wt gain, classically pleuritic or exertional cp,  orthopnea pnd or arm/hand swelling  or leg swelling, presyncope, palpitations, abdominal pain, anorexia, nausea, vomiting, diarrhea  or change in bowel habits or change in bladder habits, change in stools or change in urine, dysuria, hematuria,  rash, arthralgias, visual complaints/R RA cva, headache, numbness, weakness or ataxia or problems with walking or coordination,  change in mood or  memory.        Current Meds  Medication Sig   acetaminophen (TYLENOL) 325 MG tablet Take 650 mg by mouth  every 6 (six) hours as needed for mild pain or headache.   albuterol (PROVENTIL HFA;VENTOLIN HFA) 108 (90 Base) MCG/ACT inhaler INHALE 2 PUFFS INTO THE LUNGS EVERY 6 (SIX) HOURS AS NEEDED FOR WHEEZING OR SHORTNESS OF BREATH.   cholecalciferol (VITAMIN D3) 25 MCG (1000 UNIT) tablet Take 2,000 Units by mouth daily.   clopidogrel (PLAVIX) 75 MG tablet Take 1 tablet (75 mg total) by mouth daily.   dapagliflozin propanediol (FARXIGA) 5 MG TABS tablet Take 1 tablet (5 mg total) by mouth daily before breakfast.   famotidine (PEPCID) 20 MG tablet Take 20 mg by mouth daily.   Fluticasone-Umeclidin-Vilant (TRELEGY ELLIPTA) 100-62.5-25 MCG/ACT AEPB Inhale 1 puff into the lungs daily.   gabapentin (NEURONTIN) 300 MG capsule TAKE 1 CAPSULE THREE TIMES DAILY (Patient taking differently: Take by mouth See admin instructions. Take one capsule (300 mg) in the morning and two capsules (600 mg) in the evening.)   Guaifenesin 1200 MG TB12 Take 1 tablet by mouth 2 (two) times daily as needed (congestion).   MELATONIN PO Take 1 tablet by mouth at bedtime.   pantoprazole (PROTONIX) 40 MG tablet TAKE 1 TABLET ONCE DAILY 30 TO 60 MINUTES BEFORE FIRST MEAL OF THE DAY   predniSONE (DELTASONE) 10 MG tablet Take by mouth See admin instructions. TAKE 2 TABLETS BY MOUTH UNTIL BETTER, THEN TAKE 1 TABLET BY MOUTH ONCE DAILY FOR 5 DAYS AND STOP   Respiratory Therapy Supplies (FLUTTER) DEVI Use as directed   rosuvastatin (CRESTOR) 20 MG tablet TAKE 1 TABLET EVERY DAY (Patient taking differently: Take 20 mg by mouth every evening.)   tamsulosin (FLOMAX) 0.4 MG CAPS capsule Take 0.4 mg by mouth every evening.   vitamin B-12 (CYANOCOBALAMIN) 1000 MCG tablet Take 1 tablet (1,000 mcg total) by mouth daily.                           Objective:   Physical Exam   Wts  02/22/2022        163  08/09/2021          161  02/09/2021          169 07/08/2020          170  01/05/2020         179 09/30/2019        176  06/11/2019          180   03/10/2019          178  09/10/2018          174  06/06/2018          171  03/25/2018       155  03/08/2018         149   01/25/2018       156  12/05/2017       157  11/21/2017     157  05/24/2017        164    01/25/17 150 lb 12.8 oz (68.4 kg)  01/18/17 150 lb (68 kg)  01/08/17 150 lb (68 kg)      Vital signs reviewed  02/22/2022  - Note at rest 02 sats  97% on RA   General appearance:    amb pleasant elderly wm nad  HEENT :  Oropharynx  clear     NECK :  without JVD/Nodes/TM/ nl carotid upstrokes bilaterally   LUNGS: no acc muscle use,  Mod barrel  contour chest wall with bilateral  Distant bs s audible wheeze and  without cough on insp or exp maneuvers and mod  Hyperresonant  to  percussion bilaterally     CV:  RRR  no s3 or murmur or increase in P2, and no edema   ABD:  soft and nontender with pos mid insp Hoover's  in the supine position. No bruits or organomegaly appreciated, bowel sounds nl  MS:   Ext warm without deformities or   obvious joint restrictions , calf tenderness, cyanosis or clubbing  SKIN: warm and dry without lesions    NEURO:  alert, approp, nl sensorium with  no motor or cerebellar deficits apparent.                       Assessment:

## 2022-02-22 ENCOUNTER — Ambulatory Visit: Payer: Medicare HMO | Admitting: Internal Medicine

## 2022-02-22 ENCOUNTER — Encounter: Payer: Self-pay | Admitting: Internal Medicine

## 2022-02-22 VITALS — BP 126/74 | HR 77 | Temp 97.7°F | Ht 67.0 in | Wt 163.8 lb

## 2022-02-22 DIAGNOSIS — J449 Chronic obstructive pulmonary disease, unspecified: Secondary | ICD-10-CM | POA: Diagnosis not present

## 2022-02-22 MED ORDER — BREZTRI AEROSPHERE 160-9-4.8 MCG/ACT IN AERO: 2.0000 | INHALATION_SPRAY | Freq: Two times a day (BID) | RESPIRATORY_TRACT | 0 refills | Status: DC

## 2022-02-22 MED ORDER — BREZTRI AEROSPHERE 160-9-4.8 MCG/ACT IN AERO: 2.0000 | INHALATION_SPRAY | Freq: Two times a day (BID) | RESPIRATORY_TRACT | 11 refills | Status: DC

## 2022-02-22 MED ORDER — ALBUTEROL SULFATE (2.5 MG/3ML) 0.083% IN NEBU: 2.5000 mg | INHALATION_SOLUTION | RESPIRATORY_TRACT | 12 refills | Status: DC | PRN

## 2022-02-22 NOTE — Patient Instructions (Addendum)
Plan A = Automatic = Always=  start trelegy and start  breztri Take 2 puffs first thing in am and then another 2 puffs about 12 hours later.   Work on inhaler technique:  relax and gently blow all the way out then take a nice smooth full deep breath back in, triggering the inhaler at same time you start breathing in.  Hold breath in for at least  5 seconds if you can. Blow out breztri  thru nose. Rinse and gargle with water when done.  If mouth or throat bother you at all,  try brushing teeth/gums/tongue with arm and hammer toothpaste/ make a slurry and gargle and spit out.      Plan B = Backup (to supplement plan A, not to replace it) Only use your albuterol inhaler as a rescue medication to be used if you can't catch your breath by resting or doing a relaxed purse lip breathing pattern.  - The less you use it, the better it will work when you need it. - Ok to use the inhaler up to 2 puffs  every 4 hours if you must but call for appointment if use goes up over your usual need - Don't leave home without it !!  (think of it like the spare tire for your car)   Plan C = Crisis (instead of Plan B but only if Plan B stops working) - only use your albuterol nebulizer if you first try Plan B and it fails to help > ok to use the nebulizer up to every 4 hours but if start needing it regularly call for immediate appointment  Plan D = Prednisone 10 if ABC not working for you Take 2 daily until better then 1 daily x 3 days and stop    Please schedule a follow up visit in 3 months but call sooner if needed

## 2022-02-22 NOTE — Addendum Note (Signed)
Addended byOralia Rud M on: 02/22/2022 10:48 AM   Modules accepted: Orders

## 2022-02-22 NOTE — Assessment & Plan Note (Addendum)
Quit smoking 1995/MM PFT's  01/20/2017  FEV1 1.05 (39 % ) ratio 54  p 18 % improvement from saba p ? prior to study with DLCO  50/53  % corrects to 77 % for alv volume  - 01/25/2017   rec increase symb to 160 2bid trial  - alpha one AT screening  01/18/17   MM  Level 190 PFT's  02/23/2017  FEV1 1.17 (44 % ) ratio 54  p 0 % improvement from saba p symb 160 prior to study with DLCO   39 % corrects to 59 % for alv volume  - 02/23/2017    add tudorza trial > no change and could not afford - 12/05/2017 flutter valve added   - 02/22/2018    > restart symb 160 2bid - 03/08/2018  After extensive coaching inhaler device,  effectiveness =    90% with elipta so try Anoro daily > improved as of 06/06/2018  - 12/05/2018 reported on televisit much worse everytime stops pred while on anoro so rec change to trelegy and pred 20 ceiling/ 5 mg floor until seen  - 12/20/2018 improved but did not start trelegy and taking anoro at hs > rec trelegy q am and stop anoro, reduce pred to floor of 59m qod > titrated complete off as of 02/2019 and gradually worse by ov 03/10/2019  - 03/10/2019 rec Prednisone as Plan D = 20 mg until better then taper off if tol   - 09/30/2019 reports lowest dose of prednisone 10 mg o/w achy/cough/nause  (since 03/2019)  - 09/30/2019  After extensive coaching inhaler device,  effectiveness =    90%  -  09/30/2019   Walked RA  2 laps @ approx 2534feach @ slow pace  stopped due to end of study, tured > sob and sats still 96% - 09/30/2019 rec wixella 250 to save over trelegy until out of donut hole    - 01/05/2020 back on trelegy100 if can afford on plan as worse doe on wixella  - 07/08/2020  After extensive coaching inhaler device,  effectiveness =    90% with dpi, 80% with hfa  - 08/09/2021 reported ocular symptoms of "eyes tired and watery" so stopped trelegy s change in resp or ocular symptoms > rec opth eval but leave off trelegy and maint on advair 250/ pred x 6 d prn  -  02/22/2022  After extensive coaching inhaler  device,  effectiveness =    75%  try breztri instead of trelegy since cough and some worse doe since last ov    Group D (now reclassified as E) in terms of symptom/risk and laba/lama/ICS  therefore appropriate rx at this point >>>  trelegy plus approp saba and prednisone short course prn as plan D in the ABCD action plan reviewed from today's AVS  F/u q 3 m, call sooner if needed     Each maintenance medication was reviewed in detail including emphasizing most importantly the difference between maintenance and prns and under what circumstances the prns are to be triggered using an action plan format where appropriate.  Total time for H and P, chart review, counseling, reviewing hfa/neb/dpi device(s) and generating customized AVS unique to this office visit / same day charting = 30 min

## 2022-03-13 ENCOUNTER — Other Ambulatory Visit: Payer: Self-pay | Admitting: Internal Medicine

## 2022-04-05 ENCOUNTER — Other Ambulatory Visit: Payer: Self-pay | Admitting: Internal Medicine

## 2022-04-05 DIAGNOSIS — R918 Other nonspecific abnormal finding of lung field: Secondary | ICD-10-CM

## 2022-04-10 DIAGNOSIS — N401 Enlarged prostate with lower urinary tract symptoms: Secondary | ICD-10-CM | POA: Diagnosis not present

## 2022-04-10 DIAGNOSIS — R351 Nocturia: Secondary | ICD-10-CM | POA: Diagnosis not present

## 2022-05-01 DIAGNOSIS — H2513 Age-related nuclear cataract, bilateral: Secondary | ICD-10-CM | POA: Diagnosis not present

## 2022-05-01 DIAGNOSIS — E119 Type 2 diabetes mellitus without complications: Secondary | ICD-10-CM | POA: Diagnosis not present

## 2022-05-01 LAB — HM DIABETES EYE EXAM

## 2022-05-02 DIAGNOSIS — Z0100 Encounter for examination of eyes and vision without abnormal findings: Secondary | ICD-10-CM | POA: Diagnosis not present

## 2022-05-03 DIAGNOSIS — D649 Anemia, unspecified: Secondary | ICD-10-CM | POA: Diagnosis not present

## 2022-05-03 DIAGNOSIS — N2581 Secondary hyperparathyroidism of renal origin: Secondary | ICD-10-CM | POA: Diagnosis not present

## 2022-05-03 DIAGNOSIS — N200 Calculus of kidney: Secondary | ICD-10-CM | POA: Diagnosis not present

## 2022-05-03 DIAGNOSIS — I129 Hypertensive chronic kidney disease with stage 1 through stage 4 chronic kidney disease, or unspecified chronic kidney disease: Secondary | ICD-10-CM | POA: Diagnosis not present

## 2022-05-03 DIAGNOSIS — N1832 Chronic kidney disease, stage 3b: Secondary | ICD-10-CM | POA: Diagnosis not present

## 2022-05-03 DIAGNOSIS — E785 Hyperlipidemia, unspecified: Secondary | ICD-10-CM | POA: Diagnosis not present

## 2022-05-03 DIAGNOSIS — J449 Chronic obstructive pulmonary disease, unspecified: Secondary | ICD-10-CM | POA: Diagnosis not present

## 2022-05-03 DIAGNOSIS — N400 Enlarged prostate without lower urinary tract symptoms: Secondary | ICD-10-CM | POA: Diagnosis not present

## 2022-05-04 LAB — LAB REPORT - SCANNED
Albumin, Urine POC: 32
Creatinine, POC: 113.3 mg/dL
EGFR: 39
Microalb Creat Ratio: 28

## 2022-05-08 DIAGNOSIS — B353 Tinea pedis: Secondary | ICD-10-CM | POA: Diagnosis not present

## 2022-05-08 DIAGNOSIS — E1151 Type 2 diabetes mellitus with diabetic peripheral angiopathy without gangrene: Secondary | ICD-10-CM | POA: Diagnosis not present

## 2022-07-08 ENCOUNTER — Other Ambulatory Visit: Payer: Self-pay | Admitting: Internal Medicine

## 2022-07-13 ENCOUNTER — Ambulatory Visit: Payer: Medicare HMO | Admitting: Internal Medicine

## 2022-07-13 ENCOUNTER — Encounter: Payer: Self-pay | Admitting: Internal Medicine

## 2022-07-13 VITALS — BP 120/74 | HR 59 | Temp 97.9°F | Ht 67.0 in | Wt 158.0 lb

## 2022-07-13 DIAGNOSIS — E538 Deficiency of other specified B group vitamins: Secondary | ICD-10-CM | POA: Diagnosis not present

## 2022-07-13 DIAGNOSIS — J449 Chronic obstructive pulmonary disease, unspecified: Secondary | ICD-10-CM

## 2022-07-13 DIAGNOSIS — E559 Vitamin D deficiency, unspecified: Secondary | ICD-10-CM | POA: Diagnosis not present

## 2022-07-13 DIAGNOSIS — E119 Type 2 diabetes mellitus without complications: Secondary | ICD-10-CM

## 2022-07-13 DIAGNOSIS — H6123 Impacted cerumen, bilateral: Secondary | ICD-10-CM

## 2022-07-13 DIAGNOSIS — Z7984 Long term (current) use of oral hypoglycemic drugs: Secondary | ICD-10-CM | POA: Diagnosis not present

## 2022-07-13 DIAGNOSIS — H6120 Impacted cerumen, unspecified ear: Secondary | ICD-10-CM | POA: Insufficient documentation

## 2022-07-13 LAB — POCT GLYCOSYLATED HEMOGLOBIN (HGB A1C): Hemoglobin A1C: 5.9 % — AB (ref 4.0–5.6)

## 2022-07-13 NOTE — Patient Instructions (Signed)
Your ears were irrigated today  Your A1c was done today  Please continue all other medications as before, and refills have been done if requested.  Please have the pharmacy call with any other refills you may need.  Please continue your efforts at being more active, low cholesterol diet, and weight control.  Please keep your appointments with your specialists as you may have planned  Please make an Appointment to return in 6 months, or sooner if needed, also with Lab Appointment for testing done 3-5 days before at the FIRST FLOOR Lab (so this is for TWO appointments - please see the scheduling desk as you leave)

## 2022-07-13 NOTE — Assessment & Plan Note (Signed)
Last vitamin D Lab Results  Component Value Date   VD25OH 41.02 01/12/2022   Stable, cont oral replacement 

## 2022-07-13 NOTE — Progress Notes (Signed)
PRE-PROCEDURE EXAM: bilateral TM cannot be visualized due to total occlusion/impaction of the ear canal.  PROCEDURE INDICATION: remove wax to visualize ear drum & relieve discomfort  CONSENT:  Verbal     PROCEDURE NOTE:     bilateral EAR:  I used warm water irrigation under direct visualization with the otoscope to free the wax bolus from the ear canal.     POST- PROCEDURE EXAM:  bilateral TMs successfully visualized and found to have no erythema     The patient tolerated the procedure well.   

## 2022-07-13 NOTE — Assessment & Plan Note (Signed)
Lab Results  Component Value Date   VITAMINB12 863 01/12/2022   Stable, cont oral replacement - b12 1000 mcg qd  

## 2022-07-13 NOTE — Progress Notes (Signed)
Patient ID: Justin Walters, male   DOB: Jul 27, 1940, 82 y.o.   MRN: [id number]        Chief Complaint: follow up HTN, HLD and dm, bilateral ear cerumen, copd       HPI:  Justin Walters is a 82 y.o. male here overall doing ok, Pt denies chest pain, increased sob or doe, wheezing, orthopnea, PND, increased LE swelling, palpitations, dizziness or syncope.   Pt denies polydipsia, polyuria, or new focal neuro s/s.    Pt denies fever, wt loss, night sweats, loss of appetite, or other constitutional symptoms  Does have bilateral hearing reduced with wax impactions.         Wt Readings from Last 3 Encounters:  07/13/22 158 lb (71.7 kg)  02/22/22 163 lb 12.8 oz (74.3 kg)  01/25/22 166 lb (75.3 kg)   BP Readings from Last 3 Encounters:  07/13/22 120/74  02/22/22 126/74  01/25/22 (!) 142/85         Past Medical History:  Diagnosis Date   At risk for sleep apnea    STOP-BANG= 5    SENT TO PCP 06-13-2013   Bladder stone    BPH (benign prostatic hyperplasia)    COPD (chronic obstructive pulmonary disease) (HCC)    Diverticulosis of colon    Dysuria    Glucose intolerance (impaired glucose tolerance)    History of kidney stones    Hyperlipidemia    Hypertension    Ureteral disorder 2018   ureteral repair   Past Surgical History:  Procedure Laterality Date   APPENDECTOMY  1980's   CATARACT EXTRACTION W/ INTRAOCULAR LENS  IMPLANT, BILATERAL     COLONOSCOPY  10-05-2003   tics only    CYSTOSCOPY N/A 06/23/2013   Procedure: CYSTOSCOPY WITH LITHOLAPAXY, BLADDER EXPLORATION, REPAIR BLADDER PERFORATION;  Surgeon: Danae Chen, MD;  Location: [facility] ;  Service: Urology;  Laterality: N/A;   CYSTOSCOPY/URETEROSCOPY/HOLMIUM LASER/STENT PLACEMENT Left 04/03/2017   Procedure: CYSTOSCOPY LEFT RETROGRADE/LEFT /URETEROSCOPY/HOLMIUM LASER/BASKET STONE EXTRACTION/STENT PLACEMENT/ URETHRAL BIOPSY;  Surgeon: Jerilee Field, MD;  Location: [facility];  Service: Urology;  Laterality: Left;    EXTRACORPOREAL SHOCK WAVE LITHOTRIPSY Left 10/23/2016   Procedure: LEFT EXTRACORPOREAL SHOCK WAVE LITHOTRIPSY (ESWL);  Surgeon: Hildred Laser, MD;  Location: [facility];  Service: Urology;  Laterality: Left;   EXTRACORPOREAL SHOCK WAVE LITHOTRIPSY Left 01/08/2017   Procedure: LEFT EXTRACORPOREAL SHOCK WAVE LITHOTRIPSY (ESWL);  Surgeon: Jerilee Field, MD;  Location: [facility];  Service: Urology;  Laterality: Left;   HOLMIUM LASER APPLICATION N/A 06/23/2013   Procedure: HOLMIUM LASER APPLICATION;  Surgeon: Danae Chen, MD;  Location: [facility];  Service: Urology;  Laterality: N/A;   LAPAROSCOPIC INGUINAL HERNIA REPAIR Bilateral 10-09-2003   TRANSURETHRAL RESECTION OF PROSTATE N/A 06/23/2013   Procedure: TRANSURETHRAL RESECTION OF THE PROSTATE;  Surgeon: Danae Chen, MD;  Location: [facility];  Service: Urology;  Laterality: N/A;    reports that he quit smoking about 29 years ago. His smoking use included cigarettes. He started smoking about 63 years ago. He has a 102 pack-year smoking history. He has never used smokeless tobacco. He reports that he does not drink alcohol and does not use drugs. family history includes COPD in his mother; Cancer in his father; Emphysema in his brother; Heart failure in his brother. No Known Allergies Current Outpatient Medications on File Prior to Visit  Medication Sig Dispense Refill   acetaminophen (TYLENOL) 325 MG tablet Take 650 mg by mouth  every 6 (six) hours as needed for mild pain or headache.     albuterol (PROVENTIL HFA;VENTOLIN HFA) 108 (90 Base) MCG/ACT inhaler INHALE 2 PUFFS INTO THE LUNGS EVERY 6 (SIX) HOURS AS NEEDED FOR WHEEZING OR SHORTNESS OF BREATH. 54 g 3   albuterol (PROVENTIL) (2.5 MG/3ML) 0.083% nebulizer solution Take 3 mLs (2.5 mg total) by nebulization every 4 (four) hours as needed for wheezing or shortness of breath. 75 mL 12   Budeson-Glycopyrrol-Formoterol (BREZTRI AEROSPHERE) 160-9-4.8 MCG/ACT AERO Inhale 2  puffs into the lungs 2 (two) times daily. 10.7 g 11   cholecalciferol (VITAMIN D3) 25 MCG (1000 UNIT) tablet Take 2,000 Units by mouth daily.     clopidogrel (PLAVIX) 75 MG tablet Take 1 tablet (75 mg total) by mouth daily. 100 tablet 3   dapagliflozin propanediol (FARXIGA) 5 MG TABS tablet Take 1 tablet (5 mg total) by mouth daily before breakfast. 90 tablet 3   famotidine (PEPCID) 20 MG tablet Take 20 mg by mouth daily.     gabapentin (NEURONTIN) 300 MG capsule TAKE 1 CAPSULE THREE TIMES DAILY 270 capsule 1   Guaifenesin 1200 MG TB12 Take 1 tablet by mouth 2 (two) times daily as needed (congestion).     MELATONIN PO Take 1 tablet by mouth at bedtime.     pantoprazole (PROTONIX) 40 MG tablet TAKE 1 TABLET ONCE DAILY 30 TO 60 MINUTES BEFORE FIRST MEAL OF THE DAY 90 tablet 3   predniSONE (DELTASONE) 10 MG tablet TAKE 2 TABLETS BY MOUTH UNTIL BETTER, THEN TAKE 1 TABLET BY MOUTH ONCE DAILY FOR 5 DAYS AND STOP 100 tablet 0   Respiratory Therapy Supplies (FLUTTER) DEVI Use as directed 1 each 0   rosuvastatin (CRESTOR) 20 MG tablet TAKE 1 TABLET EVERY DAY (Patient taking differently: Take 20 mg by mouth every evening.) 90 tablet 2   tamsulosin (FLOMAX) 0.4 MG CAPS capsule Take 0.4 mg by mouth every evening.     vitamin B-12 (CYANOCOBALAMIN) 1000 MCG tablet Take 1 tablet (1,000 mcg total) by mouth daily. 90 tablet 1   No current facility-administered medications on file prior to visit.        ROS:  All others reviewed and negative.  Objective        PE:  BP 120/74 (BP Location: Right Arm, Patient Position: Sitting, Cuff Size: Normal)   Pulse (!) 59   Temp 97.9 F (36.6 C) (Oral)   Ht 5\' 7"  (1.702 m)   Wt 158 lb (71.7 kg)   SpO2 98%   BMI 24.75 kg/m                 Constitutional: Pt appears in NAD               HENT: Head: NCAT.                Right Ear: External ear normal.                 Left Ear: External ear normal. Bilateral cerumen impactions resolved with irrigation                Eyes: . Pupils are equal, round, and reactive to light. Conjunctivae and EOM are normal               Nose: without d/c or deformity               Neck: Neck supple. Gross normal ROM  Cardiovascular: Normal rate and regular rhythm.                 Pulmonary/Chest: Effort normal and breath sounds without rales or wheezing.                Abd:  Soft, NT, ND, + BS, no organomegaly               Neurological: Pt is alert. At baseline orientation, motor grossly intact               Skin: Skin is warm. No rashes, no other new lesions, LE edema - none               Psychiatric: Pt behavior is normal without agitation   Micro: none  Cardiac tracings I have personally interpreted today:  none  Pertinent Radiological findings (summarize): none   Lab Results  Component Value Date   WBC 8.7 01/12/2022   HGB 13.4 01/12/2022   HCT 41.3 01/12/2022   PLT 198.0 01/12/2022   GLUCOSE 93 01/12/2022   CHOL 107 01/12/2022   TRIG 97.0 01/12/2022   HDL 41.20 01/12/2022   LDLDIRECT 161.9 03/28/2012   LDLCALC 46 01/12/2022   ALT 9 01/12/2022   AST 14 01/12/2022   NA 142 01/12/2022   K 3.8 01/12/2022   CL 105 01/12/2022   CREATININE 1.41 01/12/2022   BUN 23 01/12/2022   CO2 28 01/12/2022   TSH 4.27 01/12/2022   PSA 0.67 01/04/2021   INR 1.1 12/05/2021   HGBA1C 5.9 (A) 07/13/2022   MICROALBUR 5.0 (H) 01/12/2022   Hemoglobin A1C 4.0 - 5.6 % 5.9 Abnormal  6.3 R, CM   Assessment/Plan:  Justin Walters is a 82 y.o. White or Caucasian [1] male with  has a past medical history of At risk for sleep apnea, Bladder stone, BPH (benign prostatic hyperplasia), COPD (chronic obstructive pulmonary disease) (HCC), Diverticulosis of colon, Dysuria, Glucose intolerance (impaired glucose tolerance), History of kidney stones, Hyperlipidemia, Hypertension, and Ureteral disorder (2018).  Vitamin D deficiency Last vitamin D Lab Results  Component Value Date   VD25OH 41.02 01/12/2022   Stable, cont  oral replacement   Vitamin B12 deficiency Lab Results  Component Value Date   VITAMINB12 863 01/12/2022   Stable, cont oral replacement - b12 1000 mcg qd   Cerumen impaction Resolved with irrigation,  to f/u any worsening symptoms or concerns    Controlled type 2 diabetes mellitus without complication, without long-term current use of insulin (HCC) Lab Results  Component Value Date   HGBA1C 5.9 (A) 07/13/2022   Stable, pt to continue current medical treatment farxiga 5 mg every day    COPD GOLD III Stable overall, cont inhaler prn  Followup: Return in about 6 months (around 01/13/2023).  Oliver Barre, MD 07/16/2022 5:36 PM  Medical Group [facility] - [facility]

## 2022-07-16 ENCOUNTER — Encounter: Payer: Self-pay | Admitting: Internal Medicine

## 2022-07-16 NOTE — Assessment & Plan Note (Signed)
Resolved with irrigation,  to f/u any worsening symptoms or concerns 

## 2022-07-16 NOTE — Assessment & Plan Note (Signed)
Stable overall, cont inhaler prn 

## 2022-07-16 NOTE — Assessment & Plan Note (Signed)
Lab Results  Component Value Date   HGBA1C 5.9 (A) 07/13/2022   Stable, pt to continue current medical treatment farxiga 5 mg every day

## 2022-07-25 NOTE — Progress Notes (Unsigned)
[facility] [address] [place]. [place] [address] [phone]       OFFICE FOLLOW-UP NOTE  [name] Date of Birth:  [birth year] Medical Record Number:  [id number]   Primary neurologist: Dr. Pearlean Brownie Reason for visit: CRAO, OD   HPI:   Update 07/26/2022 JM: Patient returns for 53-month follow up.   Continues on Plavix and Crestor, denies side effects.  Routinely follows with PCP for stroke risk factor management.      Consult visit 01/25/2022 Dr. Pearlean Brownie: [name] is a 82 year old Caucasian male seen today for initial office follow-up visit following hospital consultation for right eye vision loss.  History is obtained from the patient and review of electronic medical records.  I have personally reviewed pertinent available imaging films in PACS.DORELL GATLIN is a 82 y.o. male with history of COPD, diabetes, hypertension, hyperlipidemia, CKD stage III and BPH who presented on 12/05/2021 after sudden onset blurring of vision in his right eye occurred on 11/17/21.  Patient was then seen by his eye doctor who referred him to a retina specialist whom he saw on 11/29/21.  He was then diagnosed with CRAO and was advised to come to the hospital for stroke work-up.  However, the hospital was very busy so patient delayed this until today.  Patient denies unilateral weakness, sensory deficit or other symptoms.  MRI scan of the brain shows no acute abnormality.  MR angiogram of the brain shows no large vessel intracranial stenosis.  Carotid ultrasound shows no significant stenosis of the right carotid but 40-59% left ICA stenosis.  LDL cholesterol 16/29/23 was 51 mg percent and repeat on 01/12/2022 was 46.  Hemoglobin A1c 07/01/2022 is 6.3.  Echocardiogram showed ejection fraction of 55 to 60% without cardiac source of embolism.  Patient was discharged on aspirin and Plavix for 3 weeks to be followed by Plavix alone.  He states he is doing well with his right eye vision remains  unchanged.  Can still see blood as if he is seeing through a film of water in most of the right eye and count fingers in daily few feet but cannot read at the distance.  There is a small area in the medial inferior fields of the right eye where cannot see anything abnormal.  He remains on aspirin and Plavix which is tolerating well without bruising or bleeding.  Blood pressure needs a good control.  He is tolerating Crestor well without muscle aches and pains.  Recent lab work 2 weeks ago for lipids and A1c was quite satisfactory.  He has no new complaints.  He denies any prior GI stroke TIA or neurological problems.  ROS:   14 system review of systems is positive for vision loss, blurred vision all other systems negative  PMH:  Past Medical History:  Diagnosis Date   At risk for sleep apnea    STOP-BANG= 5    SENT TO PCP 06-13-2013   Bladder stone    BPH (benign prostatic hyperplasia)    COPD (chronic obstructive pulmonary disease) (HCC)    Diverticulosis of colon    Dysuria    Glucose intolerance (impaired glucose tolerance)    History of kidney stones    Hyperlipidemia    Hypertension    Ureteral disorder 2018   ureteral repair    Social History:  Social History   Socioeconomic History   Marital status: Widowed    Spouse name: Not on file   Number of children: 3  Years of education: Not on file   Highest education level: GED or equivalent  Occupational History   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[employer])  Tobacco Use   Smoking status: Former    Current packs/day: 0.00    Average packs/day: 3.0 packs/day for 34.0 years (102.0 ttl pk-yrs)    Types: Cigarettes    Start date: 01/03/1959    Quit date: 01/02/1993    Years since quitting: 29.5   Smokeless tobacco: Never  Vaping Use   Vaping status: Never Used  Substance and Sexual Activity   Alcohol use: No    Alcohol/week: 4.0 standard drinks of alcohol    Types: 4 Standard drinks or equivalent per week   Drug use:  No   Sexual activity: Not Currently    Partners: Female  Other Topics Concern   Not on file  Social History Narrative   Widowed; lives in [place] home with daughter and Information systems manager.   Feels safe in home.  Wears seatbelt.   No current Advanced Directives.   Independent with ADLs.   No physical activity; does a lot of yard work.   Social Determinants of Health   Financial Resource Strain: Low Risk  (07/09/2022)   Overall Financial Resource Strain (CARDIA)    Difficulty of Paying Living Expenses: Not hard at all  Food Insecurity: No Food Insecurity (07/09/2022)   Hunger Vital Sign    Worried About Running Out of Food in the Last Year: Never true    Ran Out of Food in the Last Year: Never true  Transportation Needs: No Transportation Needs (07/09/2022)   PRAPARE - Administrator, Civil Service (Medical): No    Lack of Transportation (Non-Medical): No  Physical Activity: Sufficiently Active (07/09/2022)   Exercise Vital Sign    Days of Exercise per Week: 5 days    Minutes of Exercise per Session: 90 min  Stress: No Stress Concern Present (07/09/2022)   [employer] of Occupational Health - Occupational Stress Questionnaire    Feeling of Stress : Not at all  Social Connections: Moderately Isolated (07/09/2022)   Social Connection and Isolation Panel [NHANES]    Frequency of Communication with Friends and Family: Once a week    Frequency of Social Gatherings with Friends and Family: Twice a week    Attends Religious Services: Never    Database administrator or Organizations: No    Attends Engineer, structural: More than 4 times per year    Marital Status: Widowed  Intimate Partner Violence: Not At Risk (10/13/2021)   Humiliation, Afraid, Rape, and Kick questionnaire    Fear of Current or Ex-Partner: No    Emotionally Abused: No    Physically Abused: No    Sexually Abused: No    Medications:   Current Outpatient Medications on File Prior to Visit  Medication  Sig Dispense Refill   acetaminophen (TYLENOL) 325 MG tablet Take 650 mg by mouth every 6 (six) hours as needed for mild pain or headache.     albuterol (PROVENTIL HFA;VENTOLIN HFA) 108 (90 Base) MCG/ACT inhaler INHALE 2 PUFFS INTO THE LUNGS EVERY 6 (SIX) HOURS AS NEEDED FOR WHEEZING OR SHORTNESS OF BREATH. 54 g 3   albuterol (PROVENTIL) (2.5 MG/3ML) 0.083% nebulizer solution Take 3 mLs (2.5 mg total) by nebulization every 4 (four) hours as needed for wheezing or shortness of breath. 75 mL 12   Budeson-Glycopyrrol-Formoterol (BREZTRI AEROSPHERE) 160-9-4.8 MCG/ACT AERO Inhale 2 puffs into the lungs 2 (two)  times daily. 10.7 g 11   cholecalciferol (VITAMIN D3) 25 MCG (1000 UNIT) tablet Take 2,000 Units by mouth daily.     clopidogrel (PLAVIX) 75 MG tablet Take 1 tablet (75 mg total) by mouth daily. 100 tablet 3   dapagliflozin propanediol (FARXIGA) 5 MG TABS tablet Take 1 tablet (5 mg total) by mouth daily before breakfast. 90 tablet 3   famotidine (PEPCID) 20 MG tablet Take 20 mg by mouth daily.     gabapentin (NEURONTIN) 300 MG capsule TAKE 1 CAPSULE THREE TIMES DAILY 270 capsule 1   Guaifenesin 1200 MG TB12 Take 1 tablet by mouth 2 (two) times daily as needed (congestion).     MELATONIN PO Take 1 tablet by mouth at bedtime.     pantoprazole (PROTONIX) 40 MG tablet TAKE 1 TABLET ONCE DAILY 30 TO 60 MINUTES BEFORE FIRST MEAL OF THE DAY 90 tablet 3   predniSONE (DELTASONE) 10 MG tablet TAKE 2 TABLETS BY MOUTH UNTIL BETTER, THEN TAKE 1 TABLET BY MOUTH ONCE DAILY FOR 5 DAYS AND STOP 100 tablet 0   Respiratory Therapy Supplies (FLUTTER) DEVI Use as directed 1 each 0   rosuvastatin (CRESTOR) 20 MG tablet TAKE 1 TABLET EVERY DAY (Patient taking differently: Take 20 mg by mouth every evening.) 90 tablet 2   tamsulosin (FLOMAX) 0.4 MG CAPS capsule Take 0.4 mg by mouth every evening.     vitamin B-12 (CYANOCOBALAMIN) 1000 MCG tablet Take 1 tablet (1,000 mcg total) by mouth daily. 90 tablet 1   No current  facility-administered medications on file prior to visit.    Allergies:  No Known Allergies  Physical Exam There were no vitals filed for this visit. There is no height or weight on file to calculate BMI.   General: well developed, well nourished pleasant elderly Caucasian male, seated, in no evident distress Head: head normocephalic and atraumatic.  Neck: supple with no carotid or supraclavicular bruits Cardiovascular: regular rate and rhythm, no murmurs Musculoskeletal: no deformity Skin:  no rash/petichiae Vascular:  Normal pulses all extremities  Neurologic Exam Mental Status: Awake and fully alert. Oriented to place and time. Recent and remote memory intact. Attention span, concentration and fund of knowledge appropriate. Mood and affect appropriate.  Cranial Nerves: Right pupil is sluggishly reactive and left briskly reactive to light.  Vision acuity is significantly diminished in the right eye to finger counting at 2 feet with a blind spot nasal quadrant.  Extraocular movements full without nystagmus. Visual fields full to confrontation. Hearing intact. Facial sensation intact. Face, tongue, palate moves normally and symmetrically.  Motor: Normal bulk and tone. Normal strength in all tested extremity muscles. Sensory.: intact to touch ,pinprick .position and vibratory sensation.  Coordination: Rapid alternating movements normal in all extremities. Finger-to-nose and heel-to-shin performed accurately bilaterally. Gait and Station: Arises from chair without difficulty. Stance is normal. Gait demonstrates normal stride length and balance . Able to heel, toe and tandem walk with only slight difficulty.  Reflexes: 1+ and symmetric. Toes downgoing.       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  Vascular risk factors of hyperlipidemia mild carotid stenosis    OD CRAO Continue Plavix  and Crestor for secondary stroke prevention measures managed by PCP Continue close PCP follow-up for aggressive stroke risk factor management including HLD with LDL goal <70      I spent *** minutes of face-to-face and non-face-to-face time with patient.  This included previsit chart review, lab review, study review, order entry, electronic health record documentation, patient education and discussion regarding above diagnoses and treatment plan and answered all other questions to patient's satisfaction  Ihor Austin, [facility]  [facility] [address] [place], [place] [address]  Phone [phone] Fax [phone] Note: This document was prepared with digital dictation and possible smart phrase technology. Any transcriptional errors that result from this process are unintentional.

## 2022-07-26 ENCOUNTER — Encounter: Payer: Self-pay | Admitting: Adult Health

## 2022-07-26 ENCOUNTER — Ambulatory Visit: Payer: Medicare HMO | Admitting: Adult Health

## 2022-07-26 ENCOUNTER — Encounter: Payer: Self-pay | Admitting: Anesthesiology

## 2022-07-26 VITALS — BP 124/75 | HR 87 | Ht 64.0 in | Wt 146.0 lb

## 2022-07-26 DIAGNOSIS — H3411 Central retinal artery occlusion, right eye: Secondary | ICD-10-CM | POA: Diagnosis not present

## 2022-07-26 DIAGNOSIS — I6522 Occlusion and stenosis of left carotid artery: Secondary | ICD-10-CM | POA: Diagnosis not present

## 2022-07-26 NOTE — Patient Instructions (Addendum)
Continue clopidogrel 75 mg daily  and Crestor for secondary stroke prevention  Continue to follow up with PCP regarding blood pressure and cholesterol management  Maintain strict control of hypertension with blood pressure goal below 130/90 and cholesterol with LDL cholesterol (bad cholesterol) goal below 70 mg/dL.   Signs of a Stroke? Follow the BEFAST method:  Balance Watch for a sudden loss of balance, trouble with coordination or vertigo Eyes Is there a sudden loss of vision in one or both eyes? Or double vision?  Face: Ask the person to smile. Does one side of the face droop or is it numb?  Arms: Ask the person to raise both arms. Does one arm drift downward? Is there weakness or numbness of a leg? Speech: Ask the person to repeat a simple phrase. Does the speech sound slurred/strange? Is the person confused ? Time: If you observe any of these signs, call 911.        Thank you for coming to see [place] at [facility]. I hope we have been able to provide you high quality care today.  You may receive a patient satisfaction survey over the next few weeks. We would appreciate your feedback and comments so that we may continue to improve ourselves and the health of our patients.

## 2022-07-31 ENCOUNTER — Other Ambulatory Visit: Payer: Self-pay | Admitting: Internal Medicine

## 2022-08-08 DIAGNOSIS — B351 Tinea unguium: Secondary | ICD-10-CM | POA: Diagnosis not present

## 2022-08-08 DIAGNOSIS — B353 Tinea pedis: Secondary | ICD-10-CM | POA: Diagnosis not present

## 2022-08-08 DIAGNOSIS — E1151 Type 2 diabetes mellitus with diabetic peripheral angiopathy without gangrene: Secondary | ICD-10-CM | POA: Diagnosis not present

## 2022-08-12 ENCOUNTER — Other Ambulatory Visit: Payer: Self-pay | Admitting: Internal Medicine

## 2022-08-14 ENCOUNTER — Other Ambulatory Visit: Payer: Self-pay

## 2022-08-15 ENCOUNTER — Other Ambulatory Visit: Payer: Self-pay | Admitting: Internal Medicine

## 2022-08-16 ENCOUNTER — Ambulatory Visit (HOSPITAL_COMMUNITY)
Admission: RE | Admit: 2022-08-16 | Discharge: 2022-08-16 | Disposition: A | Discharge status: Home / Self Care | Payer: Medicare HMO | Source: Ambulatory Visit | Attending: Adult Health | Admitting: Adult Health

## 2022-08-16 DIAGNOSIS — I6522 Occlusion and stenosis of left carotid artery: Secondary | ICD-10-CM

## 2022-08-16 NOTE — Progress Notes (Signed)
Bilateral carotid ultrasound study completed.   Please see CV Procedures for preliminary results.   , RVT  10:06 AM 08/16/22

## 2022-11-08 DIAGNOSIS — B353 Tinea pedis: Secondary | ICD-10-CM | POA: Diagnosis not present

## 2022-11-08 DIAGNOSIS — B351 Tinea unguium: Secondary | ICD-10-CM | POA: Diagnosis not present

## 2022-11-08 DIAGNOSIS — E1151 Type 2 diabetes mellitus with diabetic peripheral angiopathy without gangrene: Secondary | ICD-10-CM | POA: Diagnosis not present

## 2022-12-29 ENCOUNTER — Other Ambulatory Visit: Payer: Self-pay

## 2022-12-29 ENCOUNTER — Other Ambulatory Visit: Payer: Self-pay | Admitting: Internal Medicine

## 2023-01-02 DIAGNOSIS — B351 Tinea unguium: Secondary | ICD-10-CM | POA: Diagnosis not present

## 2023-01-02 DIAGNOSIS — E1151 Type 2 diabetes mellitus with diabetic peripheral angiopathy without gangrene: Secondary | ICD-10-CM | POA: Diagnosis not present

## 2023-01-02 DIAGNOSIS — B353 Tinea pedis: Secondary | ICD-10-CM | POA: Diagnosis not present

## 2023-01-15 ENCOUNTER — Other Ambulatory Visit (INDEPENDENT_AMBULATORY_CARE_PROVIDER_SITE_OTHER): Payer: Medicare HMO

## 2023-01-15 DIAGNOSIS — E559 Vitamin D deficiency, unspecified: Secondary | ICD-10-CM | POA: Diagnosis not present

## 2023-01-15 DIAGNOSIS — R7302 Impaired glucose tolerance (oral): Secondary | ICD-10-CM | POA: Diagnosis not present

## 2023-01-15 DIAGNOSIS — E538 Deficiency of other specified B group vitamins: Secondary | ICD-10-CM

## 2023-01-15 DIAGNOSIS — E7800 Pure hypercholesterolemia, unspecified: Secondary | ICD-10-CM

## 2023-01-15 LAB — URINALYSIS, ROUTINE W REFLEX MICROSCOPIC
Bilirubin Urine: NEGATIVE
Hgb urine dipstick: NEGATIVE
Ketones, ur: NEGATIVE
Nitrite: POSITIVE — AB
RBC / HPF: NONE SEEN (ref 0–?)
Specific Gravity, Urine: 1.02 (ref 1.000–1.030)
Urine Glucose: >=1000 — AB
Urobilinogen, UA: 0.2 (ref 0.0–1.0)
pH: 6 (ref 5.0–8.0)

## 2023-01-15 LAB — CBC WITH DIFFERENTIAL/PLATELET
Basophils Absolute: 0.1 10*3/uL (ref 0.0–0.1)
Basophils Relative: 0.6 % (ref 0.0–3.0)
Eosinophils Absolute: 0.2 10*3/uL (ref 0.0–0.7)
Eosinophils Relative: 1.7 % (ref 0.0–5.0)
HCT: 44.6 % (ref 39.0–52.0)
Hemoglobin: 14.2 g/dL (ref 13.0–17.0)
Lymphocytes Relative: 15.8 % (ref 12.0–46.0)
Lymphs Abs: 1.4 10*3/uL (ref 0.7–4.0)
MCHC: 31.7 g/dL (ref 30.0–36.0)
MCV: 86.7 fL (ref 78.0–100.0)
Monocytes Absolute: 0.9 10*3/uL (ref 0.1–1.0)
Monocytes Relative: 10.5 % (ref 3.0–12.0)
Neutro Abs: 6.3 10*3/uL (ref 1.4–7.7)
Neutrophils Relative %: 71.4 % (ref 43.0–77.0)
Platelets: 250 10*3/uL (ref 150.0–400.0)
RBC: 5.15 Mil/uL (ref 4.22–5.81)
RDW: 14.8 % (ref 11.5–15.5)
WBC: 8.8 10*3/uL (ref 4.0–10.5)

## 2023-01-15 LAB — LIPID PANEL
Cholesterol: 129 mg/dL (ref 0–200)
HDL: 52.7 mg/dL (ref >39.00)
LDL Cholesterol: 58 mg/dL (ref 0–99)
NonHDL: 75.94
Total CHOL/HDL Ratio: 2
Triglycerides: 90 mg/dL (ref 0.0–149.0)
VLDL: 18 mg/dL (ref 0.0–40.0)

## 2023-01-15 LAB — BASIC METABOLIC PANEL
BUN: 21 mg/dL (ref 6–23)
CO2: 30 meq/L (ref 19–32)
Calcium: 9.1 mg/dL (ref 8.4–10.5)
Chloride: 105 meq/L (ref 96–112)
Creatinine, Ser: 1.64 mg/dL — ABNORMAL HIGH (ref 0.40–1.50)
GFR: 38.87 mL/min — ABNORMAL LOW (ref >60.00)
Glucose, Bld: 92 mg/dL (ref 70–99)
Potassium: 4.9 meq/L (ref 3.5–5.1)
Sodium: 140 meq/L (ref 135–145)

## 2023-01-15 LAB — HEMOGLOBIN A1C: Hgb A1c MFr Bld: 6.5 % (ref 4.6–6.5)

## 2023-01-15 LAB — HEPATIC FUNCTION PANEL
ALT: 10 U/L (ref 0–53)
AST: 13 U/L (ref 0–37)
Albumin: 4 g/dL (ref 3.5–5.2)
Alkaline Phosphatase: 53 U/L (ref 39–117)
Bilirubin, Direct: 0.1 mg/dL (ref 0.0–0.3)
Total Bilirubin: 0.5 mg/dL (ref 0.2–1.2)
Total Protein: 6.6 g/dL (ref 6.0–8.3)

## 2023-01-15 LAB — TSH: TSH: 3.09 u[IU]/mL (ref 0.35–5.50)

## 2023-01-15 LAB — VITAMIN D 25 HYDROXY (VIT D DEFICIENCY, FRACTURES): VITD: 40.25 ng/mL (ref 30.00–100.00)

## 2023-01-15 LAB — VITAMIN B12: Vitamin B-12: 981 pg/mL — ABNORMAL HIGH (ref 211–911)

## 2023-01-19 ENCOUNTER — Ambulatory Visit: Payer: Medicare HMO | Admitting: Internal Medicine

## 2023-01-24 NOTE — Progress Notes (Signed)
Patient ID: Justin Walters, male   DOB: 29-Mar-1940      MRN: [id number]    Brief patient profile:  71  yowm MM quit smoking 1995 with cough/wheeze/ sob and never recovered with documented GOLD III copd 02/23/2017      History of Present Illness  01/25/2017 1st  office visit/ Mayeli Bornhorst   GOLD III with reversibility  Chief Complaint  Patient presents with   Pulmonary Consult    Referred by the hospital. He c/o SOB and coughing for the past several wks. His cough is occ prod, but he is unsure of sputum color. He is SOB with exertion and also when he lies down.  He gets winded walking approx 100 yards. He is using his albuterol inhaler 3 x per wk on average.   baseline doe across the street to son's house  nl pace and then onset   x one year gradually worse doe and no better on inhalers but poor baseline hfa/ assoc with cough / sense of chest congestion and has to prop up at 30 degrees now at hs to get comfortable Present doe = MMRC3 = can't walk 100 yards even at a slow pace at a flat grade s stopping due to sob  Easily confused with details of care/ names of meds / not using spiriva/ was better on pred and worse since tapered rec Plan A = Automatic =  symbicort 160 Take 2 puffs first thing in am and then another 2 puffs about 12 hours later.  Work on inhaler technique:  Plan B = Backup Only use your albuterol  as a rescue  Prednisone 10 mg take  4 each am x 2 days,   2 each am x 2 days,  1 each am x 2 days and stop          02/22/2022 6 m  f/u ov/Janssen Zee re: GOLD 3 copd   maint on Trelegy 100  no recent prednisone needed  Chief Complaint  Patient presents with   Follow-up    Doing well until 1 week ago,  more phlem with slight cough.  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  Dyspnea:  walks to son's house daily, may be slowing down a bit  Cough: min rattling / does not wake up with it Sleeping: bed blocks and one pillow and no rsp cc  SABA use: not needing  02: none  Covid status:   vax x 2 , infected one   Rec Plan A = Automatic = Always=  start trelegy and start  breztri Take 2 puffs first thing in am and then another 2 puffs about 12 hours later.  Work on inhaler technique: Plan B = Backup (to supplement plan A, not to replace it) Only use your albuterol inhaler as a rescue medication Plan C = Crisis (instead of Plan B but only if Plan B stops working) - only use your albuterol nebulizer if you first try Plan B  Plan D = Prednisone 10 if ABC not working for you Take 2 daily until better then 1 daily x 3 days and stop    01/26/2023  f/u ov/Olyn Landstrom re: GOLD 3    maint on breztri but wants trelegy  due to cost - last prednisone sev weeks prior to OV  as PLAN D above Chief Complaint  Patient presents with   Follow-up    Patient doing well.  Did have cough and chest tightness about 2 weeks ago.  Would like refill on  prednisone.  Also would like to get back on Trelegy.  Dyspnea:  walking to son's house no change Cough: not now  Sleeping: bed blocks/one pillows  resp cc  SABA use: none  02: none    No obvious day to day or daytime variability or assoc excess/ purulent sputum or mucus plugs or hemoptysis or cp or  subjective wheeze or overt sinus or hb symptoms.    Also denies any obvious fluctuation of symptoms with weather or environmental changes or other aggravating or alleviating factors except as outlined above   No unusual exposure hx or h/o childhood pna/ asthma or knowledge of premature birth.  Current Allergies, Complete Past Medical History, Past Surgical History, Family History, and Social History were reviewed in [employer] record.  ROS  The following are not active complaints unless bolded Hoarseness, sore throat, dysphagia, dental problems, itching, sneezing,  nasal congestion or discharge of excess mucus or purulent secretions, ear ache,   fever, chills, sweats, unintended wt loss or wt gain, classically pleuritic or exertional cp,  orthopnea pnd or  arm/hand swelling  or leg swelling, presyncope, palpitations, abdominal pain, anorexia, nausea, vomiting, diarrhea  or change in bowel habits or change in bladder habits, change in stools or change in urine, dysuria, hematuria,  rash, arthralgias, visual complaints, headache, numbness, weakness or ataxia or problems with walking or coordination,  change in mood or  memory.        Current Meds  Medication Sig   acetaminophen (TYLENOL) 325 MG tablet Take 650 mg by mouth every 6 (six) hours as needed for mild pain or headache.   albuterol (PROVENTIL HFA;VENTOLIN HFA) 108 (90 Base) MCG/ACT inhaler INHALE 2 PUFFS INTO THE LUNGS EVERY 6 (SIX) HOURS AS NEEDED FOR WHEEZING OR SHORTNESS OF BREATH.   albuterol (PROVENTIL) (2.5 MG/3ML) 0.083% nebulizer solution Take 3 mLs (2.5 mg total) by nebulization every 4 (four) hours as needed for wheezing or shortness of breath.   Budeson-Glycopyrrol-Formoterol (BREZTRI AEROSPHERE) 160-9-4.8 MCG/ACT AERO Inhale 2 puffs into the lungs 2 (two) times daily.   cholecalciferol (VITAMIN D3) 25 MCG (1000 UNIT) tablet Take 2,000 Units by mouth daily.   clopidogrel (PLAVIX) 75 MG tablet TAKE 1 TABLET EVERY DAY   dapagliflozin propanediol (FARXIGA) 5 MG TABS tablet Take 1 tablet (5 mg total) by mouth daily before breakfast.   famotidine (PEPCID) 20 MG tablet Take 20 mg by mouth daily.   gabapentin (NEURONTIN) 300 MG capsule TAKE 1 CAPSULE THREE TIMES DAILY   Guaifenesin 1200 MG TB12 Take 1 tablet by mouth 2 (two) times daily as needed (congestion).   MELATONIN PO Take 1 tablet by mouth at bedtime.   pantoprazole (PROTONIX) 40 MG tablet TAKE 1 TABLET ONCE DAILY 30 TO 60 MINUTES BEFORE FIRST MEAL OF THE DAY   Respiratory Therapy Supplies (FLUTTER) DEVI Use as directed   rosuvastatin (CRESTOR) 20 MG tablet TAKE 1 TABLET EVERY DAY   tamsulosin (FLOMAX) 0.4 MG CAPS capsule Take 0.4 mg by mouth every evening.   vitamin B-12 (CYANOCOBALAMIN) 1000 MCG tablet Take 1 tablet (1,000 mcg  total) by mouth daily.              Objective:   Physical Exam   Wts  01/26/2023        163  02/22/2022        163  08/09/2021          161  02/09/2021          169  07/08/2020          170  01/05/2020         179 09/30/2019        176  06/11/2019          180  03/10/2019          178 09/10/2018          174  06/06/2018          171  03/25/2018       155  03/08/2018         149   01/25/2018       156  12/05/2017       157  11/21/2017     157  05/24/2017        164    01/25/17 150 lb 12.8 oz (68.4 kg)  01/18/17 150 lb (68 kg)  01/08/17 150 lb (68 kg)     Vital signs reviewed  01/26/2023  - Note at rest 02 sats  94% on RA    General appearance:    robust pleasant amb wm nad     HEENT :  Oropharynx  clear /  Nasal turbinates nl    NECK :  without JVD/Nodes/TM/ nl carotid upstrokes bilaterally   LUNGS: no acc muscle use,  Mod barrel contour chest wall with bilateral  Distant bs s audible wheeze and  without cough on insp or exp maneuvers and mod  Hyperresonant  to  percussion bilaterally     CV:  RRR  no s3 or murmur or increase in P2, and no edema   ABD:  soft and nontender with pos mid insp Hoover's  in the supine position. No bruits or organomegaly appreciated, bowel sounds nl  MS:   Ext warm without deformities or   obvious joint restrictions , calf tenderness, cyanosis or clubbing  SKIN: warm and dry without lesions    NEURO:  alert, approp, nl sensorium with  no motor or cerebellar deficits apparent.             Assessment:

## 2023-01-25 ENCOUNTER — Other Ambulatory Visit: Payer: Self-pay | Admitting: Internal Medicine

## 2023-01-25 DIAGNOSIS — R918 Other nonspecific abnormal finding of lung field: Secondary | ICD-10-CM

## 2023-01-26 ENCOUNTER — Encounter: Payer: Self-pay | Admitting: Internal Medicine

## 2023-01-26 ENCOUNTER — Ambulatory Visit: Payer: Medicare HMO | Admitting: Internal Medicine

## 2023-01-26 VITALS — BP 106/58 | HR 83 | Temp 97.8°F | Ht 67.0 in | Wt 163.6 lb

## 2023-01-26 DIAGNOSIS — J449 Chronic obstructive pulmonary disease, unspecified: Secondary | ICD-10-CM

## 2023-01-26 MED ORDER — PREDNISONE 10 MG PO TABS: ORAL_TABLET | ORAL | 3 refills | Status: AC

## 2023-01-26 MED ORDER — TRELEGY ELLIPTA 100-62.5-25 MCG/ACT IN AEPB: INHALATION_SPRAY | RESPIRATORY_TRACT | 11 refills | Status: DC

## 2023-01-26 NOTE — Assessment & Plan Note (Addendum)
Quit smoking 1995/MM PFT's  01/20/2017  FEV1 1.05 (39 % ) ratio 54  p 18 % improvement from saba p ? prior to study with DLCO  50/53  % corrects to 77 % for alv volume  - 01/25/2017   rec increase symb to 160 2bid trial  - alpha one AT screening  01/18/17   MM  Level 190 PFT's  02/23/2017  FEV1 1.17 (44 % ) ratio 54  p 0 % improvement from saba p symb 160 prior to study with DLCO   39 % corrects to 59 % for alv volume  - 02/23/2017    add tudorza trial > no change and could not afford - 12/05/2017 flutter valve added   - 02/22/2018    > restart symb 160 2bid - 03/08/2018  After extensive coaching inhaler device,  effectiveness =    90% with elipta so try Anoro daily > improved as of 06/06/2018  - 12/05/2018 reported on televisit much worse everytime stops pred while on anoro so rec change to trelegy and pred 20 ceiling/ 5 mg floor until seen  - 12/20/2018 improved but did not start trelegy and taking anoro at hs > rec trelegy q am and stop anoro, reduce pred to floor of 5mg  qod > titrated complete off as of 02/2019 and gradually worse by ov 03/10/2019  - 03/10/2019 rec Prednisone as Plan D = 20 mg until better then taper off if tol   - 09/30/2019 reports lowest dose of prednisone 10 mg o/w achy/cough/nause  (since 03/2019)  - 09/30/2019  After extensive coaching inhaler device,  effectiveness =    90%  -  09/30/2019   Walked RA  2 laps @ approx 240ft each @ slow pace  stopped due to end of study, tured > sob and sats still 96% - 09/30/2019 rec wixella 250 to save over trelegy until out of donut hole    - 01/05/2020 back on trelegy100 if can afford on plan as worse doe on wixella  - 07/08/2020  After extensive coaching inhaler device,  effectiveness =    90% with dpi, 80% with hfa  - 08/09/2021 reported ocular symptoms of "eyes tired and watery" so stopped trelegy s change in resp or ocular symptoms > rec opth eval but leave off trelegy and maint on advair 250/ pred x 6 d prn   >>> 01/26/2023  After extensive coaching inhaler  device,  effectiveness =    80% DPI / elipta    Group D (now reclassified as E) in terms of symptom/risk and laba/lama/ICS  therefore appropriate rx at this point >>>  prefers trelegy 100 due to cost which is fine me as long as rinses/ gargle p use with [employer] toothpaste prn   Approp saba reviewed: Re SABA :  I spent extra time with pt today reviewing appropriate use of albuterol for prn use on exertion with the following points: 1) saba is for relief of sob that does not improve by walking a slower pace or resting but rather if the pt does not improve after trying this first. 2) If the pt is convinced, as many are, that saba helps recover from activity faster then it's easy to tell if this is the case by re-challenging : ie stop, take the inhaler, then p 5 minutes try the exact same activity (intensity of workload) that just caused the symptoms and see if they are substantially diminished or not after saba 3) if there is an  activity that reproducibly causes the symptoms, try the saba 15 min before the activity on alternate days   If in fact the saba really does help, then fine to continue to use it prn but advised may need to look closer at the maintenance regimen being used to achieve better control of airways disease with exertion.   F/u q 6 m, sooner prn          Each maintenance medication was reviewed in detail including emphasizing most importantly the difference between maintenance and prns and under what circumstances the prns are to be triggered using an action plan format where appropriate.  Total time for H and P, chart review, counseling, reviewing hfa/dpi/elipta device(s) and generating customized AVS unique to this office visit / same day charting = 20 min

## 2023-01-26 NOTE — Patient Instructions (Signed)
Ok to change back to trelegy 100 one click each am   Work on inhaler technique:  relax and gently blow all the way out then take a nice smooth full deep breath back in,  .  Hold breath in for at least  5 seconds if you can. Blow out trelegy  thru nose. Rinse and gargle with water when done.  If mouth or throat bother you at all,  try brushing teeth/gums/tongue with arm and hammer toothpaste/ make a slurry and gargle and spit out.    Please schedule a follow up visit in 6  months but call sooner if needed

## 2023-02-02 ENCOUNTER — Encounter: Payer: Self-pay | Admitting: Internal Medicine

## 2023-02-02 ENCOUNTER — Telehealth: Payer: Self-pay | Admitting: Internal Medicine

## 2023-02-02 ENCOUNTER — Ambulatory Visit (INDEPENDENT_AMBULATORY_CARE_PROVIDER_SITE_OTHER): Payer: Medicare HMO | Admitting: Internal Medicine

## 2023-02-02 VITALS — BP 100/68 | HR 82 | Temp 98.5°F | Ht 67.0 in | Wt 162.0 lb

## 2023-02-02 DIAGNOSIS — E7800 Pure hypercholesterolemia, unspecified: Secondary | ICD-10-CM | POA: Diagnosis not present

## 2023-02-02 DIAGNOSIS — E538 Deficiency of other specified B group vitamins: Secondary | ICD-10-CM | POA: Diagnosis not present

## 2023-02-02 DIAGNOSIS — Z7984 Long term (current) use of oral hypoglycemic drugs: Secondary | ICD-10-CM

## 2023-02-02 DIAGNOSIS — I10 Essential (primary) hypertension: Secondary | ICD-10-CM

## 2023-02-02 DIAGNOSIS — E119 Type 2 diabetes mellitus without complications: Secondary | ICD-10-CM | POA: Diagnosis not present

## 2023-02-02 DIAGNOSIS — Z0000 Encounter for general adult medical examination without abnormal findings: Secondary | ICD-10-CM | POA: Diagnosis not present

## 2023-02-02 DIAGNOSIS — J449 Chronic obstructive pulmonary disease, unspecified: Secondary | ICD-10-CM

## 2023-02-02 DIAGNOSIS — Z0001 Encounter for general adult medical examination with abnormal findings: Secondary | ICD-10-CM

## 2023-02-02 DIAGNOSIS — N1832 Chronic kidney disease, stage 3b: Secondary | ICD-10-CM | POA: Diagnosis not present

## 2023-02-02 DIAGNOSIS — E559 Vitamin D deficiency, unspecified: Secondary | ICD-10-CM

## 2023-02-02 NOTE — Assessment & Plan Note (Signed)
Overcontrolled, to reduce b12 to 2 x //wk

## 2023-02-02 NOTE — Assessment & Plan Note (Signed)
BP Readings from Last 3 Encounters:  02/02/23 100/68  01/26/23 (!) 106/58  07/26/22 124/75   Stable, pt to continue medical treatment  - diet, wt control

## 2023-02-02 NOTE — Telephone Encounter (Signed)
I called and spoke with Dorathy Daft, pharmacist at [facility]. Dorathy Daft states the instructions are not clear. Sending to Dr Sherene Sires to clarify instructions on Trelegy. It was labeled as 1 click each am. Pharmacist states this is usually 2 puffs daily. Please clarify.

## 2023-02-02 NOTE — Assessment & Plan Note (Signed)
Lab Results  Component Value Date   HGBA1C 6.5 01/15/2023   Stable, pt to continue current medical treatment farxiga 5 mg qd

## 2023-02-02 NOTE — Telephone Encounter (Signed)
He can take it as many puffs as he wants but the important point is one click  each am  - if she wants to put  2 puffs fine as long as pt understands  1 click = one dose, which is all he gets each am

## 2023-02-02 NOTE — Telephone Encounter (Signed)
[facility] pharmacy needs clarification on medication Trelegy. Please call and advise [phone]

## 2023-02-02 NOTE — Patient Instructions (Signed)
Ok to reduce the B12 to twice per week  Please continue all other medications as before, and refills have been done if requested.  Please have the pharmacy call with any other refills you may need.  Please continue your efforts at being more active, low cholesterol diet, and weight control.  You are otherwise up to date with prevention measures today.  Please keep your appointments with your specialists as you may have planned  Please make an Appointment to return in 6 months, or sooner if needed

## 2023-02-02 NOTE — Telephone Encounter (Signed)
Spoke to [employer] with [employer] and relayed below message. She voiced her understanding.  Nothing further needed.

## 2023-02-02 NOTE — Assessment & Plan Note (Signed)

## 2023-02-02 NOTE — Assessment & Plan Note (Signed)
Lab Results  Component Value Date   LDLCALC 58 01/15/2023   Stable, pt to continue current statin crestor 20 qd

## 2023-02-02 NOTE — Assessment & Plan Note (Signed)
Stable, cont pulm follow up

## 2023-02-02 NOTE — Assessment & Plan Note (Signed)
Last vitamin D Lab Results  Component Value Date   VD25OH 40.25 01/15/2023   Stable, cont oral replacement

## 2023-02-02 NOTE — Progress Notes (Signed)
Patient ID: Justin Walters, male   DOB: 08-09-1940, 84 y.o.   MRN: [id number]         Chief Complaint:: wellness exam and low b12, ckd3a, dm, htn, hld, low vit d       HPI:  Justin Walters is a 83 y.o. male here for wellness exam; up to date                Also Pt denies chest pain, increased sob or doe, wheezing, orthopnea, PND, increased LE swelling, palpitations, dizziness or syncope.   Pt denies polydipsia, polyuria, or new focal neuro s/s.    Pt denies fever, wt loss, night sweats, loss of appetite, or other constitutional symptoms     Wt Readings from Last 3 Encounters:  02/02/23 162 lb (73.5 kg)  01/26/23 163 lb 9.6 oz (74.2 kg)  07/26/22 146 lb (66.2 kg)   BP Readings from Last 3 Encounters:  02/02/23 100/68  01/26/23 (!) 106/58  07/26/22 124/75   Immunization History  Administered Date(s) Administered   Fluad Quad(high Dose 65+) 10/08/2020, 12/07/2021, 10/02/2022   Influenza Whole 11/30/2006, 10/03/2007, 12/11/2008, 10/13/2009   Influenza, High Dose Seasonal PF 12/30/2015, 11/15/2016, 10/01/2017   Influenza,inj,Quad PF,6+ Mos 11/17/2014   Influenza-Unspecified 10/24/2019   PFIZER(Purple Top)SARS-COV-2 Vaccination 03/09/2019, 04/08/2019, 10/24/2019   Pneumococcal Conjugate-13 05/01/2013   Pneumococcal Polysaccharide-23 10/03/2007   Td 10/03/2007   Tdap 07/30/2018   Zoster Recombinant(Shingrix) 07/25/2017, 12/03/2017   Zoster, Live 10/03/2007   Health Maintenance Due  Topic Date Due   Medicare Annual Wellness (AWV)  10/14/2022      Past Medical History:  Diagnosis Date   At risk for sleep apnea    STOP-BANG= 5    SENT TO PCP 06-13-2013   Bladder stone    BPH (benign prostatic hyperplasia)    COPD (chronic obstructive pulmonary disease) (HCC)    Diverticulosis of colon    Dysuria    Glucose intolerance (impaired glucose tolerance)    History of kidney stones    Hyperlipidemia    Hypertension    Ureteral disorder 2018   ureteral repair   Past Surgical History:   Procedure Laterality Date   APPENDECTOMY  1980's   CATARACT EXTRACTION W/ INTRAOCULAR LENS  IMPLANT, BILATERAL     COLONOSCOPY  10-05-2003   tics only    CYSTOSCOPY N/A 06/23/2013   Procedure: CYSTOSCOPY WITH LITHOLAPAXY, BLADDER EXPLORATION, REPAIR BLADDER PERFORATION;  Surgeon: Danae Chen, MD;  Location: [facility];  Service: Urology;  Laterality: N/A;   CYSTOSCOPY/URETEROSCOPY/HOLMIUM LASER/STENT PLACEMENT Left 04/03/2017   Procedure: CYSTOSCOPY LEFT RETROGRADE/LEFT /URETEROSCOPY/HOLMIUM LASER/BASKET STONE EXTRACTION/STENT PLACEMENT/ URETHRAL BIOPSY;  Surgeon: Jerilee Field, MD;  Location: [facility];  Service: Urology;  Laterality: Left;   EXTRACORPOREAL SHOCK WAVE LITHOTRIPSY Left 10/23/2016   Procedure: LEFT EXTRACORPOREAL SHOCK WAVE LITHOTRIPSY (ESWL);  Surgeon: Hildred Laser, MD;  Location: [facility];  Service: Urology;  Laterality: Left;   EXTRACORPOREAL SHOCK WAVE LITHOTRIPSY Left 01/08/2017   Procedure: LEFT EXTRACORPOREAL SHOCK WAVE LITHOTRIPSY (ESWL);  Surgeon: Jerilee Field, MD;  Location: [facility];  Service: Urology;  Laterality: Left;   HOLMIUM LASER APPLICATION N/A 06/23/2013   Procedure: HOLMIUM LASER APPLICATION;  Surgeon: Danae Chen, MD;  Location: [facility];  Service: Urology;  Laterality: N/A;   LAPAROSCOPIC INGUINAL HERNIA REPAIR Bilateral 10-09-2003   TRANSURETHRAL RESECTION OF PROSTATE N/A 06/23/2013   Procedure: TRANSURETHRAL RESECTION OF THE PROSTATE;  Surgeon: Danae Chen, MD;  Location: [facility];  Service: Urology;  Laterality: N/A;    reports that he quit smoking about 30 years ago. His smoking use included cigarettes. He started smoking about 64 years ago. He has a 102 pack-year smoking history. He has never used smokeless tobacco. He reports that he does not drink alcohol and does not use drugs. family history includes COPD in his mother; Cancer in his father; Emphysema in his brother; Heart failure in his  brother. No Known Allergies Current Outpatient Medications on File Prior to Visit  Medication Sig Dispense Refill   acetaminophen (TYLENOL) 325 MG tablet Take 650 mg by mouth every 6 (six) hours as needed for mild pain or headache.     albuterol (PROVENTIL HFA;VENTOLIN HFA) 108 (90 Base) MCG/ACT inhaler INHALE 2 PUFFS INTO THE LUNGS EVERY 6 (SIX) HOURS AS NEEDED FOR WHEEZING OR SHORTNESS OF BREATH. 54 g 3   albuterol (PROVENTIL) (2.5 MG/3ML) 0.083% nebulizer solution Take 3 mLs (2.5 mg total) by nebulization every 4 (four) hours as needed for wheezing or shortness of breath. 75 mL 12   clopidogrel (PLAVIX) 75 MG tablet TAKE 1 TABLET EVERY DAY 90 tablet 3   dapagliflozin propanediol (FARXIGA) 5 MG TABS tablet Take 1 tablet (5 mg total) by mouth daily before breakfast. 90 tablet 3   famotidine (PEPCID) 20 MG tablet Take 20 mg by mouth daily.     Fluticasone-Umeclidin-Vilant (TRELEGY ELLIPTA) 100-62.5-25 MCG/ACT AEPB One click each am 1 each 11   gabapentin (NEURONTIN) 300 MG capsule TAKE 1 CAPSULE THREE TIMES DAILY 270 capsule 3   Guaifenesin 1200 MG TB12 Take 1 tablet by mouth 2 (two) times daily as needed (congestion).     MELATONIN PO Take 1 tablet by mouth at bedtime.     pantoprazole (PROTONIX) 40 MG tablet TAKE 1 TABLET ONCE DAILY 30 TO 60 MINUTES BEFORE FIRST MEAL OF THE DAY 90 tablet 3   predniSONE (DELTASONE) 10 MG tablet 2 daily until better, then 1 daily x 5  and stop 100 tablet 3   Respiratory Therapy Supplies (FLUTTER) DEVI Use as directed 1 each 0   rosuvastatin (CRESTOR) 20 MG tablet TAKE 1 TABLET EVERY DAY 90 tablet 3   tamsulosin (FLOMAX) 0.4 MG CAPS capsule Take 0.4 mg by mouth every evening.     cholecalciferol (VITAMIN D3) 25 MCG (1000 UNIT) tablet Take 2,000 Units by mouth daily. (Patient not taking: Reported on 02/02/2023)     vitamin B-12 (CYANOCOBALAMIN) 1000 MCG tablet Take 1 tablet (1,000 mcg total) by mouth daily. (Patient not taking: Reported on 02/02/2023) 90 tablet 1    No current facility-administered medications on file prior to visit.        ROS:  All others reviewed and negative.  Objective        PE:  BP 100/68 (BP Location: Left Arm, Patient Position: Sitting, Cuff Size: Normal)   Pulse 82   Temp 98.5 F (36.9 C) (Oral)   Ht 5\' 7"  (1.702 m)   Wt 162 lb (73.5 kg)   SpO2 93%   BMI 25.37 kg/m                 Constitutional: Pt appears in NAD               HENT: Head: NCAT.                Right Ear: External ear normal.  Left Ear: External ear normal.                Eyes: . Pupils are equal, round, and reactive to light. Conjunctivae and EOM are normal               Nose: without d/c or deformity               Neck: Neck supple. Gross normal ROM               Cardiovascular: Normal rate and regular rhythm.                 Pulmonary/Chest: Effort normal and breath sounds without rales or wheezing.                Abd:  Soft, NT, ND, + BS, no organomegaly               Neurological: Pt is alert. At baseline orientation, motor grossly intact               Skin: Skin is warm. No rashes, no other new lesions, LE edema - none               Psychiatric: Pt behavior is normal without agitation   Micro: none  Cardiac tracings I have personally interpreted today:  none  Pertinent Radiological findings (summarize): none   Lab Results  Component Value Date   WBC 8.8 01/15/2023   HGB 14.2 01/15/2023   HCT 44.6 01/15/2023   PLT 250.0 01/15/2023   GLUCOSE 92 01/15/2023   CHOL 129 01/15/2023   TRIG 90.0 01/15/2023   HDL 52.70 01/15/2023   LDLDIRECT 161.9 03/28/2012   LDLCALC 58 01/15/2023   ALT 10 01/15/2023   AST 13 01/15/2023   NA 140 01/15/2023   K 4.9 01/15/2023   CL 105 01/15/2023   CREATININE 1.64 (H) 01/15/2023   BUN 21 01/15/2023   CO2 30 01/15/2023   TSH 3.09 01/15/2023   PSA 0.67 01/04/2021   INR 1.1 12/05/2021   HGBA1C 6.5 01/15/2023   MICROALBUR 5.0 (H) 01/12/2022   Assessment/Plan:  Justin Walters is a  83 y.o. White or Caucasian [1] male with  has a past medical history of At risk for sleep apnea, Bladder stone, BPH (benign prostatic hyperplasia), COPD (chronic obstructive pulmonary disease) (HCC), Diverticulosis of colon, Dysuria, Glucose intolerance (impaired glucose tolerance), History of kidney stones, Hyperlipidemia, Hypertension, and Ureteral disorder (2018).  Encounter for well adult exam with abnormal findings Age and sex appropriate education and counseling updated with regular exercise and diet Referrals for preventative services - none needed Immunizations addressed - none needed Smoking counseling  - none needed Evidence for depression or other mood disorder - none significant Most recent labs reviewed. I have personally reviewed and have noted: 1) the patient's medical and social history 2) The patient's current medications and supplements 3) The patient's height, weight, and BMI have been recorded in the chart   CKD (chronic kidney disease) stage 3, GFR 30-59 ml/min (HCC) Lab Results  Component Value Date   CREATININE 1.64 (H) 01/15/2023   Stable overall, cont to avoid nephrotoxins   Controlled type 2 diabetes mellitus without complication, without long-term current use of insulin (HCC) Lab Results  Component Value Date   HGBA1C 6.5 01/15/2023   Stable, pt to continue current medical treatment farxiga 5 mg qd   Essential hypertension BP Readings from Last 3 Encounters:  02/02/23 100/68  01/26/23 (!) 106/58  07/26/22 124/75   Stable, pt to continue medical treatment  - diet, wt control   Hyperlipidemia Lab Results  Component Value Date   LDLCALC 58 01/15/2023   Stable, pt to continue current statin crestor 20 qd   Vitamin B12 deficiency Overcontrolled, to reduce b12 to 2 x //wk  Vitamin D deficiency Last vitamin D Lab Results  Component Value Date   VD25OH 40.25 01/15/2023   Stable, cont oral replacement   COPD (chronic obstructive pulmonary  disease) (HCC) Stable, cont pulm follow up  Followup: Return in about 6 months (around 08/02/2023).  Oliver Barre, MD 02/02/2023 10:55 PM [facility]

## 2023-02-02 NOTE — Assessment & Plan Note (Signed)
Lab Results  Component Value Date   CREATININE 1.64 (H) 01/15/2023   Stable overall, cont to avoid nephrotoxins

## 2023-02-04 ENCOUNTER — Encounter: Payer: Self-pay | Admitting: Internal Medicine

## 2023-02-05 MED ORDER — FLUTICASONE-SALMETEROL 250-50 MCG/ACT IN AEPB: 1.0000 | INHALATION_SPRAY | Freq: Two times a day (BID) | RESPIRATORY_TRACT | 1 refills | Status: AC

## 2023-02-21 ENCOUNTER — Telehealth: Payer: Self-pay | Admitting: Internal Medicine

## 2023-02-21 ENCOUNTER — Ambulatory Visit (INDEPENDENT_AMBULATORY_CARE_PROVIDER_SITE_OTHER): Payer: Medicare HMO

## 2023-02-21 VITALS — Ht 67.0 in | Wt 162.0 lb

## 2023-02-21 DIAGNOSIS — Z0000 Encounter for general adult medical examination without abnormal findings: Secondary | ICD-10-CM

## 2023-02-21 NOTE — Telephone Encounter (Signed)
AWV nurse will attempt to call pt now

## 2023-02-21 NOTE — Telephone Encounter (Signed)
Patient was suppose to have a appt today he missed the call for the appt he would like a call back

## 2023-02-21 NOTE — Progress Notes (Cosign Needed Addendum)
 Subjective:  Please attest and cosign this visit due to patients primary care provider not being in the office at the time the visit was completed.     Justin Walters is a 83 y.o. male who presents for Medicare Annual/Subsequent preventive examination.  Visit Complete: Virtual I connected with  Justin Walters on 02/21/23 by a audio enabled telemedicine application and verified that I am speaking with the correct person using two identifiers.  Patient Location: Home  Provider Location: Office/Clinic  I discussed the limitations of evaluation and management by telemedicine. The patient expressed understanding and agreed to proceed.  Vital Signs: Because this visit was a virtual/telehealth visit, some criteria may be missing or patient reported. Any vitals not documented were not able to be obtained and vitals that have been documented are patient reported.  Patient Medicare AWV questionnaire was completed by the patient on 02/20/2023; I have confirmed that all information answered by patient is correct and no changes since this date.  Cardiac Risk Factors include: advanced age (>30men, >58 women);diabetes mellitus;hypertension;dyslipidemia;male gender     Objective:    Today's Vitals   02/21/23 1155  Weight: 162 lb (73.5 kg)  Height: 5\' 7"  (1.702 m)   Body mass index is 25.37 kg/m.     02/21/2023   11:53 AM 10/13/2021    4:05 PM 10/08/2020    4:04 PM 01/29/2020    2:41 AM 07/03/2019    8:23 AM 03/26/2018    2:00 PM 03/30/2017   10:10 AM  Advanced Directives  Does Patient Have a Medical Advance Directive? No No No No No No No  Would patient like information on creating a medical advance directive? No - Patient declined No - Patient declined No - Patient declined No - Patient declined No - Patient declined No - Patient declined No - Patient declined    Current Medications (verified) Outpatient Encounter Medications as of 02/21/2023  Medication Sig   acetaminophen (TYLENOL) 325 MG  tablet Take 650 mg by mouth every 6 (six) hours as needed for mild pain or headache.   albuterol (PROVENTIL HFA;VENTOLIN HFA) 108 (90 Base) MCG/ACT inhaler INHALE 2 PUFFS INTO THE LUNGS EVERY 6 (SIX) HOURS AS NEEDED FOR WHEEZING OR SHORTNESS OF BREATH.   albuterol (PROVENTIL) (2.5 MG/3ML) 0.083% nebulizer solution Take 3 mLs (2.5 mg total) by nebulization every 4 (four) hours as needed for wheezing or shortness of breath.   cholecalciferol (VITAMIN D3) 25 MCG (1000 UNIT) tablet Take 2,000 Units by mouth daily.   clopidogrel (PLAVIX) 75 MG tablet TAKE 1 TABLET EVERY DAY   dapagliflozin propanediol (FARXIGA) 5 MG TABS tablet Take 1 tablet (5 mg total) by mouth daily before breakfast.   famotidine (PEPCID) 20 MG tablet Take 20 mg by mouth daily.   fluticasone-salmeterol (WIXELA INHUB) 250-50 MCG/ACT AEPB Inhale 1 puff into the lungs in the morning and at bedtime.   Fluticasone-Umeclidin-Vilant (TRELEGY ELLIPTA) 100-62.5-25 MCG/ACT AEPB One click each am   gabapentin (NEURONTIN) 300 MG capsule TAKE 1 CAPSULE THREE TIMES DAILY   Guaifenesin 1200 MG TB12 Take 1 tablet by mouth 2 (two) times daily as needed (congestion).   MELATONIN PO Take 1 tablet by mouth at bedtime.   pantoprazole (PROTONIX) 40 MG tablet TAKE 1 TABLET ONCE DAILY 30 TO 60 MINUTES BEFORE FIRST MEAL OF THE DAY   predniSONE (DELTASONE) 10 MG tablet 2 daily until better, then 1 daily x 5  and stop   Respiratory Therapy Supplies (FLUTTER) DEVI Use as  directed   rosuvastatin (CRESTOR) 20 MG tablet TAKE 1 TABLET EVERY DAY   tamsulosin (FLOMAX) 0.4 MG CAPS capsule Take 0.4 mg by mouth every evening.   vitamin B-12 (CYANOCOBALAMIN) 1000 MCG tablet Take 1 tablet (1,000 mcg total) by mouth daily.   No facility-administered encounter medications on file as of 02/21/2023.    Allergies (verified) Patient has no known allergies.   History: Past Medical History:  Diagnosis Date   At risk for sleep apnea    STOP-BANG= 5    SENT TO PCP  06-13-2013   Bladder stone    BPH (benign prostatic hyperplasia)    COPD (chronic obstructive pulmonary disease) (HCC)    Diverticulosis of colon    Dysuria    Glucose intolerance (impaired glucose tolerance)    History of kidney stones    Hyperlipidemia    Hypertension    Ureteral disorder 2018   ureteral repair   Past Surgical History:  Procedure Laterality Date   APPENDECTOMY  1980's   CATARACT EXTRACTION W/ INTRAOCULAR LENS  IMPLANT, BILATERAL     COLONOSCOPY  10-05-2003   tics only    CYSTOSCOPY N/A 06/23/2013   Procedure: CYSTOSCOPY WITH LITHOLAPAXY, BLADDER EXPLORATION, REPAIR BLADDER PERFORATION;  Surgeon: Danae Chen, MD;  Location: [facility];  Service: Urology;  Laterality: N/A;   CYSTOSCOPY/URETEROSCOPY/HOLMIUM LASER/STENT PLACEMENT Left 04/03/2017   Procedure: CYSTOSCOPY LEFT RETROGRADE/LEFT /URETEROSCOPY/HOLMIUM LASER/BASKET STONE EXTRACTION/STENT PLACEMENT/ URETHRAL BIOPSY;  Surgeon: Jerilee Field, MD;  Location: [facility];  Service: Urology;  Laterality: Left;   EXTRACORPOREAL SHOCK WAVE LITHOTRIPSY Left 10/23/2016   Procedure: LEFT EXTRACORPOREAL SHOCK WAVE LITHOTRIPSY (ESWL);  Surgeon: Hildred Laser, MD;  Location: [facility];  Service: Urology;  Laterality: Left;   EXTRACORPOREAL SHOCK WAVE LITHOTRIPSY Left 01/08/2017   Procedure: LEFT EXTRACORPOREAL SHOCK WAVE LITHOTRIPSY (ESWL);  Surgeon: Jerilee Field, MD;  Location: [facility];  Service: Urology;  Laterality: Left;   HOLMIUM LASER APPLICATION N/A 06/23/2013   Procedure: HOLMIUM LASER APPLICATION;  Surgeon: Danae Chen, MD;  Location: [facility];  Service: Urology;  Laterality: N/A;   LAPAROSCOPIC INGUINAL HERNIA REPAIR Bilateral 10-09-2003   TRANSURETHRAL RESECTION OF PROSTATE N/A 06/23/2013   Procedure: TRANSURETHRAL RESECTION OF THE PROSTATE;  Surgeon: Danae Chen, MD;  Location: [facility];  Service: Urology;  Laterality: N/A;   Family History  Problem Relation Age  of Onset   COPD Mother    Cancer Father        unknown   Emphysema Brother    Heart failure Brother    Colon cancer Neg Hx    Esophageal cancer Neg Hx    Rectal cancer Neg Hx    Stomach cancer Neg Hx    Social History   Socioeconomic History   Marital status: Widowed    Spouse name: Not on file   Number of children: 3   Years of education: Not on file   Highest education level: GED or equivalent  Occupational History   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[employer])  Tobacco Use   Smoking status: Former    Current packs/day: 0.00    Average packs/day: 3.0 packs/day for 34.0 years (102.0 ttl pk-yrs)    Types: Cigarettes    Start date: 01/03/1959    Quit date: 01/02/1993    Years since quitting: 30.1   Smokeless tobacco: Never  Vaping Use   Vaping status: Never Used  Substance and Sexual Activity   Alcohol use: No  Alcohol/week: 4.0 standard drinks of alcohol    Types: 4 Standard drinks or equivalent per week   Drug use: No   Sexual activity: Not Currently    Partners: Female  Other Topics Concern   Not on file  Social History Narrative   Widowed; lives in [place] home with daughter and grandaughter.   Feels safe in home.  Wears seatbelt.   No current Advanced Directives.   Independent with ADLs.   No physical activity; does a lot of yard work.   Social Drivers of Corporate investment banker Strain: Low Risk  (02/21/2023)   Overall Financial Resource Strain (CARDIA)    Difficulty of Paying Living Expenses: Not hard at all  Food Insecurity: No Food Insecurity (02/21/2023)   Hunger Vital Sign    Worried About Running Out of Food in the Last Year: Never true    Ran Out of Food in the Last Year: Never true  Transportation Needs: No Transportation Needs (02/21/2023)   PRAPARE - Administrator, Civil Service (Medical): No    Lack of Transportation (Non-Medical): No  Physical Activity: Sufficiently Active (02/21/2023)   Exercise Vital Sign    Days of  Exercise per Week: 5 days    Minutes of Exercise per Session: 110 min  Stress: No Stress Concern Present (02/21/2023)   [employer] of Occupational Health - Occupational Stress Questionnaire    Feeling of Stress : Not at all  Social Connections: Socially Isolated (02/21/2023)   Social Connection and Isolation Panel [NHANES]    Frequency of Communication with Friends and Family: More than three times a week    Frequency of Social Gatherings with Friends and Family: Twice a week    Attends Religious Services: Never    Database administrator or Organizations: No    Attends Banker Meetings: Never    Marital Status: Widowed    Tobacco Counseling Counseling given: Not Answered   Clinical Intake:  Pre-visit preparation completed: Yes  Pain : No/denies pain     BMI - recorded: 25.37 Nutritional Status: BMI 25 -29 Overweight Nutritional Risks: None Diabetes: Yes CBG done?: No Did pt. bring in CBG monitor from home?: No  How often do you need to have someone help you when you read instructions, pamphlets, or other written materials from your doctor or pharmacy?: 1 - Never  Interpreter Needed?: No  Information entered by :: Hassell Halim, CMA   Activities of Daily Living    02/21/2023   11:57 AM  In your present state of health, do you have any difficulty performing the following activities:  Hearing? 0  Vision? 0  Difficulty concentrating or making decisions? 0  Walking or climbing stairs? 0  Dressing or bathing? 0  Doing errands, shopping? 0  Preparing Food and eating ? N  Using the Toilet? N  In the past six months, have you accidently leaked urine? N  Do you have problems with loss of bowel control? N  Managing your Medications? N  Managing your Finances? N  Housekeeping or managing your Housekeeping? N    Patient Care Team: Corwin Levins, MD as PCP - General Jerilee Field, MD as Consulting Physician (Urology) Gillian Shields, Alta Corning, MD as  Referring Physician (Urology)  Indicate any recent Medical Services you may have received from other than [facility] providers in the past year (date may be approximate).     Assessment:   This is a routine wellness examination for Justin Walters.  Hearing/Vision screen Hearing Screening - Comments:: Denies hearing difficulties   Vision Screening - Comments:: Wears eyeglasses only for reading - [facility]   Goals Addressed               This Visit's Progress     Patient Stated (pt-stated)        Patient stated that he plans to stay active.       Depression Screen    02/21/2023   12:01 PM 02/02/2023    2:16 PM 07/13/2022    8:12 AM 01/12/2022    9:24 AM 01/12/2022    8:46 AM 10/13/2021    4:16 PM 07/07/2021    8:56 AM  PHQ 2/9 Scores  PHQ - 2 Score 0 0 0 0 0 0 0  PHQ- 9 Score       1    Fall Risk    02/21/2023   12:02 PM 02/02/2023    2:16 PM 01/26/2023   10:08 AM 07/13/2022    8:12 AM 01/12/2022    9:24 AM  Fall Risk   Falls in the past year? 0 0 0 0 0  Number falls in past yr: 0 0  0 0  Injury with Fall? 0 0  0 0  Risk for fall due to : No Fall Risks No Fall Risks  No Fall Risks   Follow up Falls evaluation completed;Falls prevention discussed Falls evaluation completed  Falls evaluation completed     MEDICARE RISK AT HOME: Medicare Risk at Home Any stairs in or around the home?: Yes (outside of home) If so, are there any without handrails?: No Home free of loose throw rugs in walkways, pet beds, electrical cords, etc?: Yes Adequate lighting in your home to reduce risk of falls?: Yes Life alert?: No Use of a cane, walker or w/c?: No Grab bars in the bathroom?: Yes Shower chair or bench in shower?: Yes Elevated toilet seat or a handicapped toilet?: No  TIMED UP AND GO:  Was the test performed?  No    Cognitive Function:    01/04/2017    9:23 AM  MMSE - Mini Mental State Exam  Orientation to time 5  Orientation to Place 5  Registration 3  Attention/ Calculation 5   Recall 1  Language- name 2 objects 2  Language- repeat 1  Language- follow 3 step command 3  Language- read & follow direction 1  Write a sentence 1  Copy design 1  Total score 28        02/21/2023   12:03 PM 10/13/2021    4:32 PM 07/03/2019    8:33 AM  6CIT Screen  What Year? 0 points 0 points 0 points  What month? 0 points 0 points 0 points  What time? 0 points 0 points 0 points  Count back from 20 0 points 0 points 0 points  Months in reverse 0 points 0 points 0 points  Repeat phrase 0 points 0 points 0 points  Total Score 0 points 0 points 0 points    Immunizations Immunization History  Administered Date(s) Administered   Fluad Quad(high Dose 65+) 10/08/2020, 12/07/2021, 10/02/2022   Influenza Whole 11/30/2006, 10/03/2007, 12/11/2008, 10/13/2009   Influenza, High Dose Seasonal PF 12/30/2015, 11/15/2016, 10/01/2017   Influenza,inj,Quad PF,6+ Mos 11/17/2014   Influenza-Unspecified 10/24/2019   PFIZER(Purple Top)SARS-COV-2 Vaccination 03/09/2019, 04/08/2019, 10/24/2019   Pneumococcal Conjugate-13 05/01/2013   Pneumococcal Polysaccharide-23 10/03/2007   Td 10/03/2007   Tdap 07/30/2018   Zoster Recombinant(Shingrix) 07/25/2017,  12/03/2017   Zoster, Live 10/03/2007    TDAP status: Up to date - 07/30/2018  Flu Vaccine status: Up to date - 10/02/2022  Pneumococcal vaccine status: Up to date - 05/01/2013  Covid-19 vaccine status: Declined, Education has been provided regarding the importance of this vaccine but patient still declined. Advised may receive this vaccine at local pharmacy or Health Dept.or vaccine clinic. Aware to provide a copy of the vaccination record if obtained from local pharmacy or Health Dept. Verbalized acceptance and understanding.  Qualifies for Shingles Vaccine? Yes   Zostavax completed Yes   Shingrix Completed?: Yes  Screening Tests Health Maintenance  Topic Date Due   OPHTHALMOLOGY EXAM  05/01/2023   Diabetic kidney evaluation - Urine ACR   05/04/2023   HEMOGLOBIN A1C  07/15/2023   Diabetic kidney evaluation - eGFR measurement  01/15/2024   FOOT EXAM  02/02/2024   Medicare Annual Wellness (AWV)  02/21/2024   DTaP/Tdap/Td (3 - Td or Tdap) 07/29/2028   Pneumonia Vaccine 64+ Years old  Completed   INFLUENZA VACCINE  Completed   Zoster Vaccines- Shingrix  Completed   HPV VACCINES  Aged Out   Colonoscopy  Discontinued   COVID-19 Vaccine  Discontinued   Hepatitis C Screening  Discontinued    Health Maintenance  There are no preventive care reminders to display for this patient.   Colorectal cancer screening: No longer required.    Additional Screening:  Hepatitis C Screening: does qualify; Completed 12/31/2019  Vision Screening: Recommended annual ophthalmology exams for early detection of glaucoma and other disorders of the eye. Is the patient up to date with their annual eye exam?  Yes  Who is the provider or what is the name of the office in which the patient attends annual eye exams? [facility] If pt is not established with a provider, would they like to be referred to a provider to establish care? No .   Dental Screening: Recommended annual dental exams for proper oral hygiene  Diabetic Foot Exam: Diabetic Foot Exam: Completed 02/02/2023. Next appt w/PCP in 07/2023.  Community Resource Referral / Chronic Care Management: CRR required this visit?  No   CCM required this visit?  No     Plan:     I have personally reviewed and noted the following in the patient's chart:   Medical and social history Use of alcohol, tobacco or illicit drugs  Current medications and supplements including opioid prescriptions. Patient is not currently taking opioid prescriptions. Functional ability and status Nutritional status Physical activity Advanced directives List of other physicians Hospitalizations, surgeries, and ER visits in previous 12 months Vitals Screenings to include cognitive, depression, and  falls Referrals and appointments  In addition, I have reviewed and discussed with patient certain preventive protocols, quality metrics, and best practice recommendations. A written personalized care plan for preventive services as well as general preventive health recommendations were provided to patient.     Darreld Mclean, CMA   02/21/2023   After Visit Summary: (MyChart) Due to this being a telephonic visit, the after visit summary with patients personalized plan was offered to patient via MyChart   Nurse Notes: none  Medical screening examination/treatment/procedure(s) were performed by non-physician practitioner and as supervising physician I was immediately available for consultation/collaboration.  I agree with above. Jacinta Shoe, MD

## 2023-02-21 NOTE — Patient Instructions (Addendum)
Justin Walters , Thank you for taking time to come for your Medicare Wellness Visit. I appreciate your ongoing commitment to your health goals. Please review the following plan we discussed and let me know if I can assist you in the future.   Referrals/Orders/Follow-Ups/Clinician Recommendations: Aim for 30 minutes of exercise or brisk walking, 6-8 glasses of water, and 5 servings of fruits and vegetables each day.   This is a list of the screening recommended for you and due dates:  Health Maintenance  Topic Date Due   Eye exam for diabetics  05/01/2023   Yearly kidney health urinalysis for diabetes  05/04/2023   Hemoglobin A1C  07/15/2023   Yearly kidney function blood test for diabetes  01/15/2024   Complete foot exam   02/02/2024   Medicare Annual Wellness Visit  02/21/2024   DTaP/Tdap/Td vaccine (3 - Td or Tdap) 07/29/2028   Pneumonia Vaccine  Completed   Flu Shot  Completed   Zoster (Shingles) Vaccine  Completed   HPV Vaccine  Aged Out   Colon Cancer Screening  Discontinued   COVID-19 Vaccine  Discontinued   Hepatitis C Screening  Discontinued    Advanced directives: (Declined) Advance directive discussed with you today. Even though you declined this today, please call our office should you change your mind, and we can give you the proper paperwork for you to fill out.  Next Medicare Annual Wellness Visit scheduled for next year: Yes - 02/2024

## 2023-03-21 ENCOUNTER — Other Ambulatory Visit: Payer: Self-pay | Admitting: Internal Medicine

## 2023-05-24 ENCOUNTER — Other Ambulatory Visit: Payer: Self-pay | Admitting: Internal Medicine

## 2023-05-24 DIAGNOSIS — E119 Type 2 diabetes mellitus without complications: Secondary | ICD-10-CM | POA: Diagnosis not present

## 2023-05-24 DIAGNOSIS — Z0100 Encounter for examination of eyes and vision without abnormal findings: Secondary | ICD-10-CM | POA: Diagnosis not present

## 2023-05-24 DIAGNOSIS — H2513 Age-related nuclear cataract, bilateral: Secondary | ICD-10-CM | POA: Diagnosis not present

## 2023-05-24 LAB — HM DIABETES EYE EXAM

## 2023-06-01 ENCOUNTER — Other Ambulatory Visit: Payer: Self-pay | Admitting: Internal Medicine

## 2023-06-10 ENCOUNTER — Encounter: Payer: Self-pay | Admitting: Internal Medicine

## 2023-06-11 ENCOUNTER — Other Ambulatory Visit: Payer: Self-pay

## 2023-06-11 MED ORDER — ROSUVASTATIN CALCIUM 20 MG PO TABS: 20.0000 mg | ORAL_TABLET | Freq: Every day | ORAL | 3 refills | Status: AC

## 2023-06-13 DIAGNOSIS — R351 Nocturia: Secondary | ICD-10-CM | POA: Diagnosis not present

## 2023-06-13 DIAGNOSIS — N401 Enlarged prostate with lower urinary tract symptoms: Secondary | ICD-10-CM | POA: Diagnosis not present

## 2023-06-13 DIAGNOSIS — Z87442 Personal history of urinary calculi: Secondary | ICD-10-CM | POA: Diagnosis not present

## 2023-06-13 DIAGNOSIS — N183 Chronic kidney disease, stage 3 unspecified: Secondary | ICD-10-CM | POA: Diagnosis not present

## 2023-06-27 DIAGNOSIS — N400 Enlarged prostate without lower urinary tract symptoms: Secondary | ICD-10-CM | POA: Diagnosis not present

## 2023-06-27 DIAGNOSIS — N200 Calculus of kidney: Secondary | ICD-10-CM | POA: Diagnosis not present

## 2023-06-27 DIAGNOSIS — I129 Hypertensive chronic kidney disease with stage 1 through stage 4 chronic kidney disease, or unspecified chronic kidney disease: Secondary | ICD-10-CM | POA: Diagnosis not present

## 2023-06-27 DIAGNOSIS — N2581 Secondary hyperparathyroidism of renal origin: Secondary | ICD-10-CM | POA: Diagnosis not present

## 2023-06-27 DIAGNOSIS — N1832 Chronic kidney disease, stage 3b: Secondary | ICD-10-CM | POA: Diagnosis not present

## 2023-08-02 ENCOUNTER — Encounter: Payer: Self-pay | Admitting: Internal Medicine

## 2023-08-02 ENCOUNTER — Ambulatory Visit: Payer: Self-pay | Admitting: Internal Medicine

## 2023-08-02 ENCOUNTER — Ambulatory Visit (INDEPENDENT_AMBULATORY_CARE_PROVIDER_SITE_OTHER): Payer: Medicare HMO | Admitting: Internal Medicine

## 2023-08-02 VITALS — BP 102/68 | HR 79 | Temp 98.2°F | Ht 67.0 in | Wt 139.4 lb

## 2023-08-02 DIAGNOSIS — E7800 Pure hypercholesterolemia, unspecified: Secondary | ICD-10-CM

## 2023-08-02 DIAGNOSIS — I10 Essential (primary) hypertension: Secondary | ICD-10-CM | POA: Diagnosis not present

## 2023-08-02 DIAGNOSIS — E538 Deficiency of other specified B group vitamins: Secondary | ICD-10-CM

## 2023-08-02 DIAGNOSIS — Z7984 Long term (current) use of oral hypoglycemic drugs: Secondary | ICD-10-CM | POA: Diagnosis not present

## 2023-08-02 DIAGNOSIS — N1832 Chronic kidney disease, stage 3b: Secondary | ICD-10-CM

## 2023-08-02 DIAGNOSIS — E559 Vitamin D deficiency, unspecified: Secondary | ICD-10-CM

## 2023-08-02 DIAGNOSIS — J449 Chronic obstructive pulmonary disease, unspecified: Secondary | ICD-10-CM | POA: Diagnosis not present

## 2023-08-02 DIAGNOSIS — E119 Type 2 diabetes mellitus without complications: Secondary | ICD-10-CM | POA: Diagnosis not present

## 2023-08-02 LAB — HEPATIC FUNCTION PANEL
ALT: 9 U/L (ref 0–53)
AST: 10 U/L (ref 0–37)
Albumin: 3.8 g/dL (ref 3.5–5.2)
Alkaline Phosphatase: 34 U/L — ABNORMAL LOW (ref 39–117)
Bilirubin, Direct: 0.2 mg/dL (ref 0.0–0.3)
Total Bilirubin: 0.8 mg/dL (ref 0.2–1.2)
Total Protein: 6.5 g/dL (ref 6.0–8.3)

## 2023-08-02 LAB — LIPID PANEL
Cholesterol: 137 mg/dL (ref 0–200)
HDL: 65 mg/dL (ref >39.00)
LDL Cholesterol: 56 mg/dL (ref 0–99)
NonHDL: 72.49
Total CHOL/HDL Ratio: 2
Triglycerides: 81 mg/dL (ref 0.0–149.0)
VLDL: 16.2 mg/dL (ref 0.0–40.0)

## 2023-08-02 LAB — CBC WITH DIFFERENTIAL/PLATELET
Basophils Absolute: 0 K/uL (ref 0.0–0.1)
Basophils Relative: 0.1 % (ref 0.0–3.0)
Eosinophils Absolute: 0.1 K/uL (ref 0.0–0.7)
Eosinophils Relative: 0.6 % (ref 0.0–5.0)
HCT: 41.7 % (ref 39.0–52.0)
Hemoglobin: 13.4 g/dL (ref 13.0–17.0)
Lymphocytes Relative: 6.9 % — ABNORMAL LOW (ref 12.0–46.0)
Lymphs Abs: 0.6 K/uL — ABNORMAL LOW (ref 0.7–4.0)
MCHC: 32.1 g/dL (ref 30.0–36.0)
MCV: 85.1 fl (ref 78.0–100.0)
Monocytes Absolute: 0.5 K/uL (ref 0.1–1.0)
Monocytes Relative: 5.6 % (ref 3.0–12.0)
Neutro Abs: 7.6 K/uL (ref 1.4–7.7)
Neutrophils Relative %: 86.8 % — ABNORMAL HIGH (ref 43.0–77.0)
Platelets: 198 K/uL (ref 150.0–400.0)
RBC: 4.89 Mil/uL (ref 4.22–5.81)
RDW: 16.1 % — ABNORMAL HIGH (ref 11.5–15.5)
WBC: 8.8 K/uL (ref 4.0–10.5)

## 2023-08-02 LAB — BASIC METABOLIC PANEL WITH GFR
BUN: 27 mg/dL — ABNORMAL HIGH (ref 6–23)
CO2: 34 meq/L — ABNORMAL HIGH (ref 19–32)
Calcium: 8.9 mg/dL (ref 8.4–10.5)
Chloride: 100 meq/L (ref 96–112)
Creatinine, Ser: 1.46 mg/dL (ref 0.40–1.50)
GFR: 44.51 mL/min — ABNORMAL LOW (ref >60.00)
Glucose, Bld: 87 mg/dL (ref 70–99)
Potassium: 4.1 meq/L (ref 3.5–5.1)
Sodium: 139 meq/L (ref 135–145)

## 2023-08-02 LAB — VITAMIN D 25 HYDROXY (VIT D DEFICIENCY, FRACTURES): VITD: 42.56 ng/mL (ref 30.00–100.00)

## 2023-08-02 LAB — HEMOGLOBIN A1C: Hgb A1c MFr Bld: 6.4 % (ref 4.6–6.5)

## 2023-08-02 LAB — VITAMIN B12: Vitamin B-12: 412 pg/mL (ref 211–911)

## 2023-08-02 NOTE — Assessment & Plan Note (Signed)
 Lab Results  Component Value Date   HGBA1C 6.5 01/15/2023   Stable, pt to continue current medical treatment farxiga 5 mg qd

## 2023-08-02 NOTE — Progress Notes (Signed)
 The test results show that your current treatment is OK, as the tests are stable.  Please continue the same plan.  There is no other need for change of treatment or further evaluation based on these results, at this time.  thanks

## 2023-08-02 NOTE — Assessment & Plan Note (Signed)
 Lab Results  Component Value Date   LDLCALC 58 01/15/2023   Stable, pt to continue current statin crestor  20 mg qd

## 2023-08-02 NOTE — Assessment & Plan Note (Signed)
 Sees renal once yearly, neg due later this year.    Lab Results  Component Value Date   CREATININE 1.64 (H) 01/15/2023   Stable overall, cont to avoid nephrotoxins

## 2023-08-02 NOTE — Progress Notes (Signed)
 Patient ID: Justin Walters, male   DOB: Nov 24, 1940, 83 y.o.   MRN: [id number]        Chief Complaint: follow up HTN, HLD and DM, ckd3b, low b12 and D       HPI:  Justin Walters is a 83 y.o. male here overall doing ok, Pt denies chest pain, increased sob or doe, wheezing, orthopnea, PND, increased LE swelling, palpitations, dizziness or syncope.   Pt denies polydipsia, polyuria, or new focal neuro s/s.    Pt denies fever, wt loss, night sweats, loss of appetite, or other constitutional symptoms        Wt Readings from Last 3 Encounters:  08/02/23 139 lb 6.4 oz (63.2 kg)  02/21/23 162 lb (73.5 kg)  02/02/23 162 lb (73.5 kg)   BP Readings from Last 3 Encounters:  08/02/23 102/68  02/02/23 100/68  01/26/23 (!) 106/58         Past Medical History:  Diagnosis Date   At risk for sleep apnea    STOP-BANG= 5    SENT TO PCP 06-13-2013   Bladder stone    BPH (benign prostatic hyperplasia)    COPD (chronic obstructive pulmonary disease) (HCC)    Diverticulosis of colon    Dysuria    Glucose intolerance (impaired glucose tolerance)    History of kidney stones    Hyperlipidemia    Hypertension    Ureteral disorder 2018   ureteral repair   Past Surgical History:  Procedure Laterality Date   APPENDECTOMY  1980's   CATARACT EXTRACTION W/ INTRAOCULAR LENS  IMPLANT, BILATERAL     COLONOSCOPY  10-05-2003   tics only    CYSTOSCOPY N/A 06/23/2013   Procedure: CYSTOSCOPY WITH LITHOLAPAXY, BLADDER EXPLORATION, REPAIR BLADDER PERFORATION;  Surgeon: Oliva VEAR Oiler, MD;  Location: [facility];  Service: Urology;  Laterality: N/A;   CYSTOSCOPY/URETEROSCOPY/HOLMIUM LASER/STENT PLACEMENT Left 04/03/2017   Procedure: CYSTOSCOPY LEFT RETROGRADE/LEFT /URETEROSCOPY/HOLMIUM LASER/BASKET STONE EXTRACTION/STENT PLACEMENT/ URETHRAL BIOPSY;  Surgeon: Nieves Cough, MD;  Location: [facility];  Service: Urology;  Laterality: Left;   EXTRACORPOREAL SHOCK WAVE LITHOTRIPSY Left 10/23/2016   Procedure: LEFT  EXTRACORPOREAL SHOCK WAVE LITHOTRIPSY (ESWL);  Surgeon: Chauncey Redell Agent, MD;  Location: [facility];  Service: Urology;  Laterality: Left;   EXTRACORPOREAL SHOCK WAVE LITHOTRIPSY Left 01/08/2017   Procedure: LEFT EXTRACORPOREAL SHOCK WAVE LITHOTRIPSY (ESWL);  Surgeon: Nieves Cough, MD;  Location: [facility];  Service: Urology;  Laterality: Left;   HOLMIUM LASER APPLICATION N/A 06/23/2013   Procedure: HOLMIUM LASER APPLICATION;  Surgeon: Oliva VEAR Oiler, MD;  Location: [facility];  Service: Urology;  Laterality: N/A;   LAPAROSCOPIC INGUINAL HERNIA REPAIR Bilateral 10-09-2003   TRANSURETHRAL RESECTION OF PROSTATE N/A 06/23/2013   Procedure: TRANSURETHRAL RESECTION OF THE PROSTATE;  Surgeon: Oliva VEAR Oiler, MD;  Location: [facility];  Service: Urology;  Laterality: N/A;    reports that he quit smoking about 30 years ago. His smoking use included cigarettes. He started smoking about 64 years ago. He has a 102 pack-year smoking history. He has never used smokeless tobacco. He reports that he does not drink alcohol and does not use drugs. family history includes COPD in his mother; Cancer in his father; Emphysema in his brother; Heart failure in his brother. No Known Allergies Current Outpatient Medications on File Prior to Visit  Medication Sig Dispense Refill   albuterol  (PROVENTIL  HFA;VENTOLIN  HFA) 108 (90 Base) MCG/ACT inhaler INHALE 2 PUFFS INTO THE LUNGS EVERY 6 (SIX) HOURS  AS NEEDED FOR WHEEZING OR SHORTNESS OF BREATH. 54 g 3   albuterol  (PROVENTIL ) (2.5 MG/3ML) 0.083% nebulizer solution USE 1 VIAL IN NEBULIZER EVERY 4 HOURS AS NEEDED FOR WHEEZING OR SHORTNESS OF BREATH 75 mL 9   cholecalciferol (VITAMIN D3) 25 MCG (1000 UNIT) tablet Take 2,000 Units by mouth daily.     clopidogrel  (PLAVIX ) 75 MG tablet TAKE 1 TABLET EVERY DAY 90 tablet 3   dapagliflozin  propanediol (FARXIGA ) 5 MG TABS tablet Take 1 tablet (5 mg total) by mouth daily before breakfast. 90 tablet 3   famotidine   (PEPCID ) 20 MG tablet Take 20 mg by mouth daily.     fluticasone -salmeterol (WIXELA INHUB) 250-50 MCG/ACT AEPB Inhale 1 puff into the lungs in the morning and at bedtime. 60 each 1   gabapentin  (NEURONTIN ) 300 MG capsule TAKE 1 CAPSULE THREE TIMES DAILY 270 capsule 3   Guaifenesin  1200 MG TB12 Take 1 tablet by mouth 2 (two) times daily as needed (congestion).     MELATONIN PO Take 1 tablet by mouth at bedtime.     pantoprazole  (PROTONIX ) 40 MG tablet TAKE 1 TABLET ONCE DAILY 30 TO 60 MINUTES BEFORE FIRST MEAL OF THE DAY 90 tablet 3   predniSONE  (DELTASONE ) 10 MG tablet 2 daily until better, then 1 daily x 5  and stop 100 tablet 3   Respiratory Therapy Supplies (FLUTTER) DEVI Use as directed 1 each 0   rosuvastatin  (CRESTOR ) 20 MG tablet Take 1 tablet (20 mg total) by mouth daily. 90 tablet 3   tamsulosin  (FLOMAX ) 0.4 MG CAPS capsule Take 0.4 mg by mouth every evening.     vitamin B-12 (CYANOCOBALAMIN ) 1000 MCG tablet Take 1 tablet (1,000 mcg total) by mouth daily. 90 tablet 1   No current facility-administered medications on file prior to visit.        ROS:  All others reviewed and negative.  Objective        PE:  BP 102/68   Pulse 79   Temp 98.2 F (36.8 C)   Ht 5' 7 (1.702 m)   Wt 139 lb 6.4 oz (63.2 kg)   SpO2 97%   BMI 21.83 kg/m                 Constitutional: Pt appears in NAD               HENT: Head: NCAT.                Right Ear: External ear normal.                 Left Ear: External ear normal.                Eyes: . Pupils are equal, round, and reactive to light. Conjunctivae and EOM are normal               Nose: without d/c or deformity               Neck: Neck supple. Gross normal ROM               Cardiovascular: Normal rate and regular rhythm.                 Pulmonary/Chest: Effort normal and breath sounds decreased without rales but few exp wheezing bilateral.                Abd:  Soft, NT, ND, + BS, no organomegaly  Neurological: Pt is alert.  At baseline orientation, motor grossly intact               Skin: Skin is warm. No rashes, no other new lesions, LE edema - none               Psychiatric: Pt behavior is normal without agitation   Micro: none  Cardiac tracings I have personally interpreted today:  none  Pertinent Radiological findings (summarize): none   Lab Results  Component Value Date   WBC 8.8 01/15/2023   HGB 14.2 01/15/2023   HCT 44.6 01/15/2023   PLT 250.0 01/15/2023   GLUCOSE 92 01/15/2023   CHOL 129 01/15/2023   TRIG 90.0 01/15/2023   HDL 52.70 01/15/2023   LDLDIRECT 161.9 03/28/2012   LDLCALC 58 01/15/2023   ALT 10 01/15/2023   AST 13 01/15/2023   NA 140 01/15/2023   K 4.9 01/15/2023   CL 105 01/15/2023   CREATININE 1.64 (H) 01/15/2023   BUN 21 01/15/2023   CO2 30 01/15/2023   TSH 3.09 01/15/2023   PSA 0.67 01/04/2021   INR 1.1 12/05/2021   HGBA1C 6.5 01/15/2023   Assessment/Plan:  Justin Walters is a 83 y.o. White or Caucasian [1] male with  has a past medical history of At risk for sleep apnea, Bladder stone, BPH (benign prostatic hyperplasia), COPD (chronic obstructive pulmonary disease) (HCC), Diverticulosis of colon, Dysuria, Glucose intolerance (impaired glucose tolerance), History of kidney stones, Hyperlipidemia, Hypertension, and Ureteral disorder (2018).  CKD (chronic kidney disease) stage 3, GFR 30-59 ml/min (HCC) Sees renal once yearly, neg due later this year.    Lab Results  Component Value Date   CREATININE 1.64 (H) 01/15/2023   Stable overall, cont to avoid nephrotoxins  COPD GOLD III Stable, cont inhaler asd, f/u pulmonary as planned  Vitamin D  deficiency Last vitamin D  Lab Results  Component Value Date   VD25OH 40.25 01/15/2023   Stable, cont oral replacement   Vitamin B12 deficiency Lab Results  Component Value Date   VITAMINB12 981 (H) 01/15/2023   Stable, cont oral replacement - b12 1000 mcg qd   Hyperlipidemia Lab Results  Component Value Date    LDLCALC 58 01/15/2023   Stable, pt to continue current statin crestor  20 mg qd   Essential hypertension BP Readings from Last 3 Encounters:  08/02/23 102/68  02/02/23 100/68  01/26/23 (!) 106/58   Stable, pt to continue medical treatment  - diet, wt control   Controlled type 2 diabetes mellitus without complication, without long-term current use of insulin (HCC) Lab Results  Component Value Date   HGBA1C 6.5 01/15/2023   Stable, pt to continue current medical treatment farxiga  5 mg qd  Followup: Return in about 6 months (around 02/02/2024).  Lynwood Rush, MD 08/02/2023 9:12 AM [facility]

## 2023-08-02 NOTE — Assessment & Plan Note (Signed)
 BP Readings from Last 3 Encounters:  08/02/23 102/68  02/02/23 100/68  01/26/23 (!) 106/58   Stable, pt to continue medical treatment  - diet, wt control

## 2023-08-02 NOTE — Assessment & Plan Note (Addendum)
 Stable, cont inhaler asd, f/u pulmonary as planned

## 2023-08-02 NOTE — Assessment & Plan Note (Signed)
 Last vitamin D Lab Results  Component Value Date   VD25OH 40.25 01/15/2023   Stable, cont oral replacement

## 2023-08-02 NOTE — Patient Instructions (Signed)
 Please continue all other medications as before, and refills have been done if requested.  Please have the pharmacy call with any other refills you may need.  Please continue your efforts at being more active, low cholesterol diet, and weight control.  Please keep your appointments with your specialists as you may have planned - renal and pulmonary as planned  Please go to the LAB at the blood drawing area for the tests to be done  You will be contacted by phone if any changes need to be made immediately.  Otherwise, you will receive a letter about your results with an explanation, but please check with MyChart first.  Please make an Appointment to return in 6 months, or sooner if needed

## 2023-08-02 NOTE — Assessment & Plan Note (Signed)
 Lab Results  Component Value Date   VITAMINB12 981 (H) 01/15/2023   Stable, cont oral replacement - b12 1000 mcg qd

## 2023-08-10 DIAGNOSIS — E119 Type 2 diabetes mellitus without complications: Secondary | ICD-10-CM | POA: Diagnosis not present

## 2023-10-17 ENCOUNTER — Other Ambulatory Visit: Payer: Self-pay | Admitting: Internal Medicine

## 2023-10-29 ENCOUNTER — Other Ambulatory Visit: Payer: Self-pay | Admitting: Internal Medicine

## 2023-11-15 ENCOUNTER — Other Ambulatory Visit: Payer: Self-pay | Admitting: Internal Medicine

## 2023-11-16 ENCOUNTER — Other Ambulatory Visit: Payer: Self-pay | Admitting: Internal Medicine

## 2023-11-16 ENCOUNTER — Other Ambulatory Visit: Payer: Self-pay

## 2023-11-16 DIAGNOSIS — R918 Other nonspecific abnormal finding of lung field: Secondary | ICD-10-CM

## 2023-11-22 DIAGNOSIS — N1831 Chronic kidney disease, stage 3a: Secondary | ICD-10-CM | POA: Diagnosis not present

## 2023-11-22 DIAGNOSIS — N200 Calculus of kidney: Secondary | ICD-10-CM | POA: Diagnosis not present

## 2024-01-31 ENCOUNTER — Other Ambulatory Visit: Payer: Self-pay | Admitting: Internal Medicine

## 2024-01-31 DIAGNOSIS — R918 Other nonspecific abnormal finding of lung field: Secondary | ICD-10-CM

## 2024-02-22 ENCOUNTER — Ambulatory Visit: Payer: Medicare HMO

## 2024-03-12 ENCOUNTER — Ambulatory Visit: Admitting: Internal Medicine
